# Patient Record
Sex: Male | Born: 1940
Health system: Southern US, Community
[De-identification: ages and names within clinical notes are randomized; demographics above are authoritative.]

## PROBLEM LIST (undated history)

## (undated) DIAGNOSIS — N529 Male erectile dysfunction, unspecified: Secondary | ICD-10-CM

## (undated) DIAGNOSIS — H5359 Other color vision deficiencies: Secondary | ICD-10-CM

## (undated) DIAGNOSIS — T7840XA Allergy, unspecified, initial encounter: Secondary | ICD-10-CM

## (undated) DIAGNOSIS — E119 Type 2 diabetes mellitus without complications: Secondary | ICD-10-CM

## (undated) DIAGNOSIS — N400 Enlarged prostate without lower urinary tract symptoms: Secondary | ICD-10-CM

## (undated) DIAGNOSIS — S060X1A Concussion with loss of consciousness of 30 minutes or less, initial encounter: Secondary | ICD-10-CM

## (undated) DIAGNOSIS — H9190 Unspecified hearing loss, unspecified ear: Secondary | ICD-10-CM

## (undated) DIAGNOSIS — E785 Hyperlipidemia, unspecified: Secondary | ICD-10-CM

## (undated) DIAGNOSIS — J189 Pneumonia, unspecified organism: Secondary | ICD-10-CM

## (undated) DIAGNOSIS — E1149 Type 2 diabetes mellitus with other diabetic neurological complication: Principal | ICD-10-CM

## (undated) DIAGNOSIS — Z86010 Personal history of colonic polyps: Secondary | ICD-10-CM

## (undated) DIAGNOSIS — M25649 Stiffness of unspecified hand, not elsewhere classified: Secondary | ICD-10-CM

## (undated) HISTORY — DX: Benign prostatic hyperplasia without lower urinary tract symptoms: N40.0

## (undated) HISTORY — DX: Concussion with loss of consciousness of 30 minutes or less, initial encounter: S06.0X1A

## (undated) HISTORY — DX: Type 2 diabetes mellitus with other diabetic neurological complication: E11.49

## (undated) HISTORY — PX: VASECTOMY: SHX75

## (undated) HISTORY — PX: TONSILLECTOMY: SUR1361

## (undated) HISTORY — DX: Hyperlipidemia, unspecified: E78.5

## (undated) HISTORY — DX: Personal history of colonic polyps: Z86.010

## (undated) HISTORY — DX: Unspecified hearing loss, unspecified ear: H91.90

## (undated) HISTORY — DX: Male erectile dysfunction, unspecified: N52.9

## (undated) HISTORY — DX: Stiffness of unspecified hand, not elsewhere classified: M25.649

## (undated) HISTORY — DX: Other color vision deficiencies: H53.59

## (undated) HISTORY — PX: COLONOSCOPY W/ POLYPECTOMY: SHX1380

## (undated) HISTORY — PX: INCISION / DRAINAGE HAND / FINGER: SUR695

---

## 2005-11-13 ENCOUNTER — Encounter (INDEPENDENT_AMBULATORY_CARE_PROVIDER_SITE_OTHER): Payer: Self-pay | Admitting: *Deleted

## 2005-11-13 ENCOUNTER — Ambulatory Visit (HOSPITAL_COMMUNITY): Admission: RE | Admit: 2005-11-13 | Discharge: 2005-11-13 | Payer: Self-pay | Admitting: Gastroenterology

## 2006-12-24 ENCOUNTER — Ambulatory Visit: Payer: Self-pay | Admitting: Internal Medicine

## 2007-04-22 ENCOUNTER — Ambulatory Visit: Payer: Self-pay | Admitting: Internal Medicine

## 2007-04-22 LAB — CONVERTED CEMR LAB
Cholesterol: 177 mg/dL (ref 0–200)
Direct LDL: 91.4 mg/dL
HDL: 21.1 mg/dL — ABNORMAL LOW (ref >39.0)
PSA: 0.69 ng/mL (ref 0.10–4.00)
Total CHOL/HDL Ratio: 8.4
Triglycerides: 219 mg/dL (ref 0–149)
VLDL: 44 mg/dL — ABNORMAL HIGH (ref 0–40)

## 2007-08-26 ENCOUNTER — Ambulatory Visit: Payer: Self-pay | Admitting: Internal Medicine

## 2008-03-15 ENCOUNTER — Ambulatory Visit: Payer: Self-pay | Admitting: Internal Medicine

## 2008-03-15 DIAGNOSIS — Z860100 Personal history of colon polyps, unspecified: Secondary | ICD-10-CM

## 2008-03-15 DIAGNOSIS — R109 Unspecified abdominal pain: Secondary | ICD-10-CM | POA: Insufficient documentation

## 2008-03-15 HISTORY — DX: Personal history of colonic polyps: Z86.010

## 2008-03-15 HISTORY — DX: Personal history of colon polyps, unspecified: Z86.0100

## 2008-07-28 ENCOUNTER — Telehealth: Payer: Self-pay | Admitting: Internal Medicine

## 2008-08-17 ENCOUNTER — Ambulatory Visit: Payer: Self-pay | Admitting: Internal Medicine

## 2008-10-28 ENCOUNTER — Ambulatory Visit: Payer: Self-pay | Admitting: Internal Medicine

## 2008-10-28 DIAGNOSIS — M25649 Stiffness of unspecified hand, not elsewhere classified: Secondary | ICD-10-CM

## 2008-10-28 HISTORY — DX: Stiffness of unspecified hand, not elsewhere classified: M25.649

## 2008-10-28 LAB — CONVERTED CEMR LAB
BUN: 10 mg/dL (ref 6–23)
CO2: 32 meq/L (ref 19–32)
Calcium: 9.2 mg/dL (ref 8.4–10.5)
Chloride: 108 meq/L (ref 96–112)
Creatinine, Ser: 1.1 mg/dL (ref 0.4–1.5)
GFR calc Af Amer: 85 mL/min
GFR calc non Af Amer: 71 mL/min
Glucose, Bld: 144 mg/dL — ABNORMAL HIGH (ref 70–99)
Potassium: 4.1 meq/L (ref 3.5–5.1)
Rheumatoid fact SerPl-aCnc: <20 intl units/mL — ABNORMAL LOW (ref 0.0–20.0)
Sed Rate: 6 mm/hr (ref 0–16)
Sodium: 143 meq/L (ref 135–145)

## 2009-11-01 ENCOUNTER — Ambulatory Visit: Payer: Self-pay | Admitting: Internal Medicine

## 2009-11-01 DIAGNOSIS — H5359 Other color vision deficiencies: Secondary | ICD-10-CM

## 2009-11-01 DIAGNOSIS — S060X1A Concussion with loss of consciousness of 30 minutes or less, initial encounter: Secondary | ICD-10-CM

## 2009-11-01 DIAGNOSIS — H9190 Unspecified hearing loss, unspecified ear: Secondary | ICD-10-CM

## 2009-11-01 DIAGNOSIS — H9113 Presbycusis, bilateral: Secondary | ICD-10-CM | POA: Insufficient documentation

## 2009-11-01 HISTORY — DX: Other color vision deficiencies: H53.59

## 2009-11-01 HISTORY — DX: Unspecified hearing loss, unspecified ear: H91.90

## 2009-11-01 HISTORY — DX: Concussion with loss of consciousness of 30 minutes or less, initial encounter: S06.0X1A

## 2009-11-01 LAB — CONVERTED CEMR LAB
BUN: 14 mg/dL (ref 6–23)
CO2: 30 meq/L (ref 19–32)
Calcium: 9.3 mg/dL (ref 8.4–10.5)
Chloride: 103 meq/L (ref 96–112)
Creatinine, Ser: 1 mg/dL (ref 0.4–1.5)
GFR calc non Af Amer: 78.36 mL/min (ref >60)
Glucose, Bld: 113 mg/dL — ABNORMAL HIGH (ref 70–99)
Potassium: 4.2 meq/L (ref 3.5–5.1)
Sodium: 140 meq/L (ref 135–145)

## 2009-11-03 ENCOUNTER — Ambulatory Visit: Payer: Self-pay | Admitting: Internal Medicine

## 2009-11-03 DIAGNOSIS — N410 Acute prostatitis: Secondary | ICD-10-CM

## 2009-11-03 DIAGNOSIS — N390 Urinary tract infection, site not specified: Secondary | ICD-10-CM

## 2009-11-03 LAB — CONVERTED CEMR LAB
Bilirubin Urine: NEGATIVE
Glucose, Urine, Semiquant: NEGATIVE
Ketones, urine, test strip: NEGATIVE
Nitrite: POSITIVE
Protein, U semiquant: NEGATIVE
Specific Gravity, Urine: 1.02
Urobilinogen, UA: 0.2
pH: 6

## 2009-11-06 ENCOUNTER — Telehealth: Payer: Self-pay | Admitting: Internal Medicine

## 2010-07-13 HISTORY — PX: CATARACT EXTRACTION: SUR2

## 2010-08-10 ENCOUNTER — Ambulatory Visit: Payer: Self-pay | Admitting: Internal Medicine

## 2010-10-23 ENCOUNTER — Encounter: Payer: Self-pay | Admitting: Internal Medicine

## 2010-11-23 ENCOUNTER — Other Ambulatory Visit: Payer: Self-pay | Admitting: Internal Medicine

## 2010-11-23 ENCOUNTER — Ambulatory Visit
Admission: RE | Admit: 2010-11-23 | Discharge: 2010-11-23 | Payer: Self-pay | Source: Home / Self Care | Attending: Internal Medicine | Admitting: Internal Medicine

## 2010-11-23 ENCOUNTER — Encounter: Payer: Self-pay | Admitting: Internal Medicine

## 2010-11-23 LAB — BASIC METABOLIC PANEL
BUN: 15 mg/dL (ref 6–23)
CO2: 30 mEq/L (ref 19–32)
Calcium: 9.5 mg/dL (ref 8.4–10.5)
Chloride: 101 mEq/L (ref 96–112)
Creatinine, Ser: 1.1 mg/dL (ref 0.4–1.5)
GFR: 73.05 mL/min (ref >60.00)
Glucose, Bld: 115 mg/dL — ABNORMAL HIGH (ref 70–99)
Potassium: 4.6 mEq/L (ref 3.5–5.1)
Sodium: 137 mEq/L (ref 135–145)

## 2010-11-23 LAB — LIPID PANEL
Cholesterol: 224 mg/dL — ABNORMAL HIGH (ref 0–200)
HDL: 28.6 mg/dL — ABNORMAL LOW (ref >39.00)
Total CHOL/HDL Ratio: 8
Triglycerides: 228 mg/dL — ABNORMAL HIGH (ref 0.0–149.0)
VLDL: 45.6 mg/dL — ABNORMAL HIGH (ref 0.0–40.0)

## 2010-11-23 LAB — CBC WITH DIFFERENTIAL/PLATELET
Basophils Absolute: 0 10*3/uL (ref 0.0–0.1)
Basophils Relative: 0.4 % (ref 0.0–3.0)
Eosinophils Absolute: 0 10*3/uL (ref 0.0–0.7)
Eosinophils Relative: 0.6 % (ref 0.0–5.0)
HCT: 45.8 % (ref 39.0–52.0)
Hemoglobin: 15.9 g/dL (ref 13.0–17.0)
Lymphocytes Relative: 34.8 % (ref 12.0–46.0)
Lymphs Abs: 2.2 10*3/uL (ref 0.7–4.0)
MCHC: 34.6 g/dL (ref 30.0–36.0)
MCV: 89 fl (ref 78.0–100.0)
Monocytes Absolute: 0.7 10*3/uL (ref 0.1–1.0)
Monocytes Relative: 11.1 % (ref 3.0–12.0)
Neutro Abs: 3.3 10*3/uL (ref 1.4–7.7)
Neutrophils Relative %: 53.1 % (ref 43.0–77.0)
Platelets: 182 10*3/uL (ref 150.0–400.0)
RBC: 5.15 Mil/uL (ref 4.22–5.81)
RDW: 14 % (ref 11.5–14.6)
WBC: 6.2 10*3/uL (ref 4.5–10.5)

## 2010-11-23 LAB — LDL CHOLESTEROL, DIRECT: Direct LDL: 146 mg/dL

## 2010-11-23 LAB — TSH: TSH: 1.31 u[IU]/mL (ref 0.35–5.50)

## 2010-12-12 NOTE — Assessment & Plan Note (Signed)
Summary: flu shot-lb  Nurse Visit   Vital Signs:  Patient profile:   70 year old male Temp:     98.3 degrees F oral  Vitals Entered By: Lanier Prude, CMA(AAMA) (August 10, 2010 1:39 PM)  Allergies: No Known Drug Allergies  Orders Added: 1)  Flu Vaccine 56yrs + MEDICARE PATIENTS [Q2039] 2)  Administration Flu vaccine - MCR [G0008] .lbmedflu   Flu Vaccine Consent Questions     Do you have a history of severe allergic reactions to this vaccine? no    Any prior history of allergic reactions to egg and/or gelatin? no    Do you have a sensitivity to the preservative Thimersol? no    Do you have a past history of Guillan-Barre Syndrome? no    Do you currently have an acute febrile illness? no    Have you ever had a severe reaction to latex? no    Vaccine information given and explained to patient? yes    Are you currently pregnant? no    Lot Number:[id number]   Exp Date:05/12/2011   Site Given  Left Deltoid IM Lanier Prude, [facility])  August 10, 2010 1:39 PM

## 2010-12-14 NOTE — Procedures (Signed)
Summary: Colonoscopy / [facility]  Colonoscopy / [facility]   Imported By: Lennie Odor 11/09/2010 14:35:54  _____________________________________________________________________  External Attachment:    Type:   Image     Comment:   External Document

## 2010-12-14 NOTE — Assessment & Plan Note (Signed)
Summary: CPX-LB   Vital Signs:  Patient profile:   70 year old male Height:      73 inches Weight:      247 pounds BMI:     32.71 Temp:     98.4 degrees F Pulse rate:   55 / minute BP sitting:   130 / 72  (left arm) Cuff size:   regular  Vitals Entered By: Lamar Sprinkles, CMA (November 23, 2010 10:16 AM) CC: Frank Carlson   Primary Care Provider:  Deforest Maiden  CC:  Frank Carlson.  History of Present Illness: Frank Carlson presents for an annual wellness exam. Interval hisotry unremarkable with no major illness, no injury or surgery. Feeling good  1005 independent in a ADLs. Cognitively in tact paying the bills, balancing check bood. No falls and no fall risk. No signs or symptoms of depression.   Preventive Screening-Counseling & Management  Alcohol-Tobacco     Alcohol drinks/day: 0     Smoking Status: quit > 6 months     Packs/Day: 2.0     Year Started: 03-31-1956     Year Quit: 03/31/1961     Pack years: 7  Caffeine-Diet-Exercise     Caffeine use/day: none     Diet Comments: Healthy diet - weight watchers     Does Patient Exercise: yes     Type of exercise: treadmill/weights     Exercise (avg: min/session): 60     Times/week: 3     Exercise Counseling: not indicated; exercise is adequate  Hep-HIV-STD-Contraception     Hepatitis Risk: no risk noted     HIV Risk: no risk noted     STD Risk: no risk noted     Dental Visit-last 6 months yes     TSE monthly: yes     04-01-2023 Exposure-Excessive: no  Safety-Violence-Falls     Seat Belt Use: yes     Helmet Use: yes     Firearms in the Home: no firearms in the home     Smoke Detectors: yes     Violence in the Home: no risk noted     Sexual Abuse: no     Fall Risk: none      Sexual History:  currently monogamous.        Drug Use:  never.        Blood Transfusions:  no.    Current Medications (verified): 1)  Fish Oil 1000 Mg Caps (Omega-3 Fatty Acids) .Marland Kitchen.. 1 Every Am 2)  Fibercon 625 Mg Tabs (Calcium Polycarbophil) .Marland Kitchen.. 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 married 01-Apr-2063 3 sons- 03-31-2065, 04-01-67, 03/31/2070; 5 grandchildren, 1 step-gtrandhcild. woodworker making furniture  Past Surgical History: Tonsillectomy I&D hand infection Vasectomy cataract extraction OD with IOL (Sept '11)  Social History: Smoking Status:  quit > 6 months Packs/Day:  2.0 Seat Belt Use:  yes Dental Care w/in 6 mos.:  yes Caffeine use/day:  none Hepatitis Risk:  no risk noted HIV Risk:  no risk noted STD Risk:  no risk noted Sun Exposure-Excessive:  no Fall Risk:  none  Review of Systems       The patient complains of decreased hearing.  The patient denies anorexia, fever, weight loss, weight gain, vision loss, hoarseness, chest pain, syncope, dyspnea on exertion, peripheral edema, prolonged cough, abdominal pain, severe indigestion/heartburn, incontinence, muscle weakness, difficulty walking, depression, abnormal bleeding, enlarged lymph nodes, and angioedema.         nocturia - x 1  Physical Exam  General:  Tall, WNWD white male in no distress Head:  normocephalic, atraumatic, and no abnormalities observed.   Eyes:  vision grossly intact, pupils equal, and pupils round, cannot tell he had cataract surgery. Fundiscopic exam deferred to Opthal  Ears:  hearing aids in place Nose:  no  external deformity, no external erythema, and no nasal discharge.   Mouth:  Oral mucosa and oropharynx without lesions or exudates.  Teeth in good repair. Neck:  supple, full ROM, no masses, no thyromegaly, and no carotid bruits.   Chest Wall:  No deformities, masses, tenderness or gynecomastia noted. Lungs:  Normal respiratory effort, chest expands symmetrically. Lungs are clear to auscultation, no crackles or wheezes. Heart:  Normal rate and regular rhythm. S1 and S2 normal without gallop, murmur, click, rub or other extra sounds. Abdomen:  soft, non-tender, normal bowel sounds, no masses, no guarding, no rigidity, and no hepatomegaly.   Rectal:  no external abnormalities, no hemorrhoids, normal sphincter tone, and no masses.   Genitalia:  no hydrocele, no varicocele, and no scrotal masses.   Prostate:  no gland enlargement, no nodules, no asymmetry, and no induration.   Msk:  no joint tenderness, no joint swelling, no joint warmth, no redness over joints, no joint deformities, and no joint instability.   Pulses:  2+ radial and DP pulses Extremities:  No clubbing, cyanosis, edema, or deformity noted with normal full range of motion of all joints.   Neurologic:  alert & oriented X3, cranial nerves II-XII intact, gait normal, and DTRs symmetrical and normal.   Skin:  turgor normal, color normal, no suspicious lesions, and no ulcerations.  Changes of old acne Cervical Nodes:  no anterior cervical adenopathy and no posterior cervical adenopathy.   Axillary Nodes:  no R axillary adenopathy and no L axillary adenopathy.   Inguinal Nodes:  no R inguinal adenopathy and no L inguinal adenopathy.   Psych:  Oriented X3, memory intact for recent and remote, normally interactive, and not anxious appearing.     Impression & Recommendations:  Problem # 1:  JOINT STIFFNESS, HAND (ICD-719.54) Seems stable with no complaints today of pain or limitation.  Problem # 2:  Preventive Health Care  (ICD-V70.0) Interval history is unremarkable except for cataract surgery. Physical exam is normal. Lab results are within normal limits except for low HDL cholesterol and modest elevation in LDL cholesterol at 146 with goal of 130 or less. Plan - careful diet in regard to fat; increase in aerobic exercise. Current with colorectal cancer screening. Normal prostate exam. Immunization: Tetnus Dec '10; Pneumonia vaccine Dec '10; flu Sept '11. Discussed shingles vaccine and he will check his insurance coverage. 12 Lead EKG with right bundle branch block otherwise normal.  In summary - a very nice man who appears medical stable and to be doing well. He will return in 1 year or as needed .  Complete Medication List: 1)  Fish Oil 1000 Mg Caps (  Omega-3 fatty acids) .Marland Kitchen.. 1 every am 2)  Fibercon 625 Mg Tabs (Calcium polycarbophil) .Marland Kitchen.. 1 by mouth q am  Other Orders: TLB-BMP (Basic Metabolic Panel-BMET) (80048-METABOL) TLB-Lipid Panel (80061-LIPID) TLB-TSH (Thyroid Stimulating Hormone) (84443-TSH) TLB-CBC Platelet - w/Differential (85025-CBCD) Medicare -1st Annual Wellness Visit [phone])   : Lew Dawes Note: All result statuses are Final unless otherwise noted.  Tests: (1) BMP (METABOL)   Sodium                    137 mEq/L                   135-145   Potassium                 4.6 mEq/L                   3.5-5.1   Chloride                  101 mEq/L                   96-112   Carbon Dioxide            30 mEq/L                    19-32   Glucose              [H]  115 mg/dL                   19-14   BUN                       15 mg/dL                    7-82   Creatinine                1.1 mg/dL                   9.5-6.2   Calcium                   9.5 mg/dL                   1.3-08.6   GFR                       73.05 mL/min                >60.00  Tests: (2) Lipid Panel (LIPID)   Cholesterol          [H]  224 mg/dL                   5-784     ATP III Classification            Desirable:  < 200  mg/dL                    Borderline High:  200 - 239 mg/dL               High:  > = 240 mg/dL   Triglycerides        [H]  228.0 mg/dL                 6.9-629.5     Normal:  <150 mg/dL     Borderline High:  284 - 199 mg/dL  HDL                  [L]  16.10 mg/dL                 >96.04   VLDL Cholesterol     [H]  45.6 mg/dL                  5.4-09.8  CHO/HDL Ratio:  CHD Risk                             8                    Men          Women     1/2 Average Risk     3.4          3.3     Average Risk          5.0          4.4     2X Average Risk          9.6          7.1     3X Average Risk          15.0          11.0                           Tests: (3) TSH (TSH)   FastTSH                   1.31 uIU/mL                 0.35-5.50  Tests: (4) CBC Platelet w/Diff (CBCD)   White Cell Count          6.2 K/uL                    4.5-10.5   Red Cell Count            5.15 Mil/uL                 4.22-5.81   Hemoglobin                15.9 g/dL                   11.9-14.7   Hematocrit                45.8 %                      39.0-52.0   MCV                       89.0 fl                     78.0-100.0   MCHC                      34.6 g/dL                   82.9-56.2   RDW                       14.0 %  11.5-14.6   Platelet Count            182.0 K/uL                  150.0-400.0   Neutrophil %              53.1 %                      43.0-77.0   Lymphocyte %              34.8 %                      12.0-46.0   Monocyte %                11.1 %                      3.0-12.0   Eosinophils%              0.6 %                       0.0-5.0   Basophils %               0.4 %                       0.0-3.0   Neutrophill Absolute      3.3 K/uL                    1.4-7.7   Lymphocyte Absolute       2.2 K/uL                    0.7-4.0   Monocyte Absolute         0.7 K/uL                    0.1-1.0  Eosinophils, Absolute                             0.0 K/uL                    0.0-0.7    Basophils Absolute        0.0 K/uL                    0.0-0.1  Tests: (5) Cholesterol LDL - Direct (DIRLDL)  Cholesterol LDL - Direct                             146.0 mg/dL     Optimal:  <621 mg/dL     Near or Above Optimal:  100-129 mg/dL     Borderline High:  308-657 mg/dL     High:  846-962 mg/dL     Very High:  >952 mg/dL  Orders Added: 1)  TLB-BMP (Basic Metabolic Panel-BMET) [80048-METABOL] 2)  TLB-Lipid Panel [80061-LIPID] 3)  TLB-TSH (Thyroid Stimulating Hormone) [84443-TSH] 4)  TLB-CBC Platelet - w/Differential [85025-CBCD] 5)  Medicare -1st Annual Wellness Visit [G0438]     Preventive Care Screening  Colonoscopy:    Date:  10/23/2010    Results:  Hyperplastic Polyp

## 2011-03-16 ENCOUNTER — Telehealth: Payer: Self-pay | Admitting: Internal Medicine

## 2011-03-16 NOTE — Telephone Encounter (Signed)
Pt states that his Insurance has instructed him to get Rx for Zostavax vaccine. Pt would like to have this Rx called into [facility] [address] [employer] [phone]: [phone] Pt also requesting Rx for Viagra 100mg . #30/as a 90-day supply, renewable.

## 2011-03-18 NOTE — Telephone Encounter (Signed)
Ok to order both zostavax and viagra

## 2011-03-21 MED ORDER — ZOSTER VACCINE LIVE 19400 UNT/0.65ML ~~LOC~~ SOLR: 0.6500 mL | Freq: Once | SUBCUTANEOUS | Status: AC

## 2011-03-21 MED ORDER — SILDENAFIL CITRATE 100 MG PO TABS: 100.0000 mg | ORAL_TABLET | ORAL | Status: DC | PRN

## 2011-03-21 NOTE — Telephone Encounter (Signed)
Medication and immunization sent  In for pt

## 2011-03-27 NOTE — Assessment & Plan Note (Signed)
[facility]                           PRIMARY CARE OFFICE NOTE   HALIL, RENTZ                     MRN:          [id number]  DATE:04/22/2007                            DOB:          06/23/1941    Mr. Lando was seen as a new patient December 25, 2006, please see  that complete dictation. This was reviewed with the patient with no  additions, corrections or changes.   The patient reports he is feeling well and doing well with no active  medical complaints at this time.   REVIEW OF SYSTEMS:  Negative for any constitutional, cardiovascular,  respiratory, GI, GU or musculoskeletal complaints.   PHYSICAL EXAMINATION:  VITAL SIGNS:  Temperature was 98.1, blood  pressure was 134/76, pulse 62, weight 255, height 6 foot.  GENERAL:  A well-nourished, well-developed, mildly overweight, Caucasian  male in no acute distress/  HEENT:  Normocephalic, atraumatic. EACs and TMs were unremarkable.  Oropharynx with native dentition in good repair. No buccal or palatal  lesions were noted. The posterior pharynx was clear. Conjunctiva and  sclera was clear. PERRLA. EOMI. Funduscopic exam was unremarkable.  NECK:  Supple without thyromegaly.  NODES:  No adenopathy was noted in the cervical or supraclavicular  regions.  CHEST:  With CVA tenderness.  LUNGS:  Clear to auscultation and percussion.  CARDIOVASCULAR:  2+ radial pulses, no JVD or carotid bruits. He had a  quiet precordium with a regular rate and rhythm without murmurs, rubs or  gallops.  ABDOMEN:  Soft, no guarding, no rebound. No organosplenomegaly was  noted.  GENITALIA:  Normal male, bilaterally descended testicles.  RECTAL:  Normal sphincter tone was noted. The prostate was smooth,  round, normal size and contour without abnormality.  EXTREMITIES:  Without clubbing, cyanosis, edema or deformity.  NEUROLOGIC:  Nonfocal.   LABORATORY DATA:  Cholesterol was 177, triglycerides were 219, HDL  was  21, LDL was 91.4. PSA was normal at 0.69.   ASSESSMENT/PLAN:  1. Lipids. The patient has a depressed HDL cholesterol at 21.1. He has      no significant cardiac risk factors otherwise, has no cardiac      history and is comfortable. Plan:  Would recommend low carbohydrate      diet and regular exercise. If his HDL remains low on this regimen,      at next examination we would consider him a candidate for niacin      therapy.  2. Gastrointestinal. Patient with a history of colon polyps but has      had colonoscopy which he thinks was in 2007. Followup in 2012.  3. Health maintenance. The patient is otherwise doing well. I do not      have his immunization records but he would be a candidate if not      already done for pneumonia vaccine and      Zostavax.  4. The patient is asked to return to see me on a p.r.n. basis.     Rosalyn Gess Norins, MD  Electronically Signed    MEN/MedQ  DD: 04/23/2007  DT: 04/23/2007  Job #:  [id number]   cc:   Lew Dawes

## 2011-03-30 NOTE — Assessment & Plan Note (Signed)
[facility]                           PRIMARY CARE OFFICE NOTE   Frank Carlson, Frank Carlson                     MRN:          [id number]  DATE:12/25/2006                            DOB:          08/24/1941    Frank Carlson is a 70 year old Caucasian gentleman who presents to  establish ongoing care, transferring from [facility] due to  insurance reasons.   CHIEF COMPLAINT:  1. GI.  The patient reports that he has had a rectal discharge with a      mucus in appearance with usually light but constantly moist.  This      has been present since June 2007.  He has tried multiple treatments      under the direction of his primary care physician.  Bulk laxatives      have been most helpful but he continues to have a small amount of      discharge.  The patient reports his last colonoscopy, which he      thinks was in January 2007 was unremarkable except for 1 small      polyp.  2. URI.  The patient with post nasal discharge and drainage over 1      month in duration.  He has no fevers or chills.  He has had no      sinus pressure or tenderness or discomfort.   PAST MEDICAL HISTORY:   SURGICAL:  1. Tonsillectomy in 1945.  2. Infection in the hand requiring surgical incision and drainage.  3. Vasectomy.   TRAUMA:  The patient had a brain concussion in a football accident in  1956.   MEDICAL:  1. Usual childhood diseases with chicken pox, measles and whooping      cough.  2. Colon polyps.   The patient otherwise been healthy with no major medical problems.   MEDICATIONS:  None.   HABITS:  The patient has stopped smoking in 1962.  He uses no alcohol or  other substances.   FAMILY HISTORY:  Grandfather with stomach cancer.  Father had an MI  times 3 and died early 42s.  Also heart disease in grandparents and  secondary kinship.  Diabetes in grandparents.  Mother with Alzheimer's  disease.  No history of lung cancer or prostate cancer.   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  He then worked for [employer] till 1997 and then retired  again.  He has been married for 44 years.  He has 3 sons age 80, 71 and  25.  Two in [place], one in [place]. He has 5 grandchildren and 1  step-grandchild.  The patient is a Psychiatrist and makes furniture and  generally keeps himself busy in his retirement.   REVIEW OF SYSTEMS:  The patient has had a 10 pound weight gain in the  last year.  No fevers, chills or other constitutional symptoms.  Last  eye exam was approximately 24 months ago by optometry.  No ENT,  cardiovascular, respiratory complaints.  GI as above, but no dyspepsia,  dysuria.  He does have a regular bowel habit.  MUSCULOSKELETAL:  The  patient has chronic low back trouble, which is intermittent in nature,  does not limit his activities.  Does do well with cyclobenzaprine on an  as needed basis and hydrocodone as needed.  GU is unremarkable with no  dysuria.  Nocturia x1.  The patient does use Viagra on an as needed  basis.  DERM:  The patient has multiple solar keratoses on his arms,  question whether any of these could be actinic or squamous cell.  The  patient does have a lesion on his left cheek, which has been unchanging.  No neurologic issues are raised.   VITAL SIGNS: Temperature is 98.1, blood pressure 133/73, pulse 61,  weight 258.6.  GENERAL APPEARANCE:  This is a tall 6 foot 1 inch or 6 foot 2 inches  gentleman who looks fit and in no distress.  No examination otherwise conducted.   ASSESSMENT AND PLAN:  1. Gastrointestinal.  The patient with a rectal discharge.  He does      seem to be improved on bulk laxatives.  I believe if this continues      to be a problem we would refer him to gastroenterology for quick      flexible sigmoidoscopy.  2. Rhinitis.  The patient with probable allergic  rhinitis versus      vasomotor rhinitis.  Would recommend over-the-counter non-sedating      antihistamines such as loratadine for control of symptoms.  3. Low back pain.  The patient is currently stable.  Would recommend      that he consider doing regular back exercises.  We have provided      information when he returns.  4. The patient has requested his records from [facility] and      these will be reviewed when available. Recommendations for      laboratory studies will be based on this review.  The patient is      asked to return to see me in May for his annual physical exam.     Rosalyn Gess. Norins, MD  Electronically Signed    MEN/MedQ  DD: 12/25/2006  DT: 12/25/2006  Job #: [id number]   cc:   Pryor Montes

## 2011-03-30 NOTE — Op Note (Signed)
NAME:  Frank Carlson, Frank Carlson NO.:  [id number]   MEDICAL RECORD NO.:  [id number]          PATIENT TYPE:  AMB   LOCATION:  ENDO                         FACILITY:  [facility]   PHYSICIAN:  Danise Edge, M.D.   DATE OF BIRTH:  05-Dec-1940   DATE OF PROCEDURE:  11/13/2005  DATE OF DISCHARGE:                                 OPERATIVE REPORT   PROCEDURE INDICATIONS:  Mr. Narek Kniss is a 70 year old male born [birth year]. Mr. Mcbreen is scheduled to undergo his first screening  colonoscopy with polypectomy to prevent colon cancer. He underwent a health  maintenance flexible proctosigmoidoscopy approximately 6 years ago.   ENDOSCOPIST:  Danise Edge, M.D.   PREMEDICATION:  Versed 5 mg, Demerol 70 mg.   PROCEDURE:  After obtaining informed consent, Mr. Kulish was placed in the  left lateral decubitus position. I administered intravenous Demerol and  intravenous Versed to achieve conscious sedation for the procedure. The  patient's blood pressure, oxygen saturation and cardiac rhythm were  monitored throughout the procedure and documented in the medical record.   Anal inspection was normal. Digital rectal exam reveals a nonnodular  prostate. The Olympus adjustable pediatric colonoscope was introduced into  the rectum and advanced to the cecum. A normal-appearing ileocecal valve and  appendiceal orifice were identified. Colonic preparation for the exam today  was excellent.   Rectum normal. Retroflexed view of the distal rectum normal.  Sigmoid colon and descending colon. Left colonic diverticulosis.  Splenic flexure normal.  Transverse colon. From the proximal transverse colon, a 3 mm sessile polyp  was removed with electrocautery snare.  Hepatic flexure normal.  Ascending colon normal.  Cecum and ileocecal valve normal.   ASSESSMENT:  A small polyp was removed from the proximal transverse colon; a  few small diverticula were present in the sigmoid colon.          ______________________________  Danise Edge, M.D.     MJ/MEDQ  D:  11/13/2005  T:  11/13/2005  Job:  [id number]   cc:   Melida Quitter, M.D.  Fax: [phone]

## 2011-08-16 ENCOUNTER — Ambulatory Visit: Payer: Medicare Other | Admitting: *Deleted

## 2011-11-12 ENCOUNTER — Encounter: Payer: Self-pay | Admitting: Internal Medicine

## 2011-11-26 ENCOUNTER — Ambulatory Visit (INDEPENDENT_AMBULATORY_CARE_PROVIDER_SITE_OTHER): Payer: Medicare Other | Admitting: Internal Medicine

## 2011-11-26 ENCOUNTER — Encounter: Payer: Self-pay | Admitting: Internal Medicine

## 2011-11-26 DIAGNOSIS — Z86010 Personal history of colonic polyps: Secondary | ICD-10-CM

## 2011-11-26 DIAGNOSIS — H9190 Unspecified hearing loss, unspecified ear: Secondary | ICD-10-CM

## 2011-11-26 DIAGNOSIS — Z0000 Encounter for general adult medical examination without abnormal findings: Secondary | ICD-10-CM | POA: Insufficient documentation

## 2011-11-26 NOTE — Assessment & Plan Note (Signed)
Stable with no change in bowel habit.

## 2011-11-26 NOTE — Assessment & Plan Note (Signed)
Interval history negative for any major illness, injury or surgery. He did fall from a ladder while cutting limbs - no major injury. Physical exam - normal. NO labs needed. Current with colorectal cancer screening. Normal prostate exam. Current with immunizations.  In summary - a very nice man who is medically stable. He will return in 1 year or as needed.

## 2011-11-26 NOTE — Progress Notes (Signed)
Subjective:    Patient ID: Frank Carlson, male    DOB: [birth year], 71 y.o.   MRN: [id number]  HPI The patient is here for annual Medicare wellness examination and management of other chronic and acute problems. He reports that he is feeling fine.    The risk factors are reflected in the social history.  The roster of all physicians providing medical care to patient - is listed in the Snapshot section of the chart.  Activities of daily living:  The patient is 100% inedpendent in all ADLs: dressing, toileting, feeding as well as independent mobility  Home safety : The patient has smoke detectors in the home. Fall safety - has grab bars in the shower, household is cleared.  They wear seatbelts. No firearms at home. There is no violence in the home.   There is no risks for hepatitis, STDs or HIV. There is no   history of blood transfusion. They have no travel history to infectious disease endemic areas of the world.  The patient has seen their dentist in the last six month. They have seen their eye doctor in the last year. They admit to any hearing difficulty and have not had audiologic testing in the last year.  Routine follow-up for maintaining hearing aids. They do not  have excessive sun exposure. Discussed the need for sun protection: hats, long sleeves and use of sunscreen if there is significant sun exposure.   Diet: the importance of a healthy diet is discussed. They do have a healthy diet.  The patient has a regular exercise program: aerobic machines and weight machines , 45 min duration, 5 per week.  The benefits of regular aerobic exercise were discussed.  Depression screen: there are no signs or vegative symptoms of depression- irritability, change in appetite, anhedonia, sadness/tearfullness.  Cognitive assessment: the patient manages all their financial and personal affairs and is actively engaged.   The following portions of the patient's history were reviewed and updated as  appropriate: allergies, current medications, past family history, past medical history,  past surgical history, past social history  and problem list.  Vision, hearing, body mass index were assessed and reviewed.   During the course of the visit the patient was educated and counseled about appropriate screening and preventive services including : fall prevention , diabetes screening, nutrition counseling, colorectal cancer screening, and recommended immunizations.  Past Medical History  Diagnosis Date  . Abdominal pain, unspecified site 03/15/2008  . Acute prostatitis 11/03/2009  . COLONIC POLYPS, HX OF 03/15/2008  . 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 CONCUSSION WITH LOC OF 30 MINUTES OR LESS 11/01/2009  . HEARING LOSS, BILATERAL 11/01/2009  . JOINT STIFFNESS, HAND 10/28/2008   Past Surgical History  Procedure Date  . Tonsillectomy   . Incision / drainage hand / finger   . Vasectomy   . Cataract extraction 07/2010    OD with IOL   Family History  Problem Relation Age of Onset  . Alzheimer's disease Mother   . Coronary artery disease Father   . Coronary artery disease Other   . Diabetes Other    History   Social History  . Marital Status: Married    Spouse Name: Andrey Campanile    Number of Children: 3  . Years of Education: 16   Occupational History  . Acupuncturist    Social History Main Topics  . Smoking status: Former Smoker -- 2.0 packs/day for 5 years    Types: Cigarettes    Quit date:  11/25/1958  . Smokeless tobacco: Never Used  . Alcohol Use: No  . Drug Use: No  . Sexually Active: Yes -- Male partner(s)   Other Topics Concern  . Not on file   Social History Narrative   [employer]- [employer]. 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 Married '64. 3 sons- '66, '68, '71; 5 grandchildren, 1 step g-dtr. 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 ACP - established HCPOA; no prolonged heroic measures if not viable. Yes- CPR, yes- short term mechanical  ventilation.        Review of Systems Constitutional:  Negative for fever, chills, activity change and unexpected weight change.  HEENT:  Negative for hearing loss, ear pain, congestion, neck stiffness and postnasal drip. Negative for sore throat or swallowing problems. Negative for dental complaints.   Eyes: Negative for vision loss or change in visual acuity.  Respiratory: Negative for chest tightness and wheezing. Negative for DOE.   Cardiovascular: Negative for chest pain or palpitations. No decreased exercise tolerance Gastrointestinal: No change in bowel habit. No bloating or gas. No reflux or indigestion Genitourinary: Negative for urgency, frequency, flank pain and difficulty urinating.  Musculoskeletal: Negative for myalgias, back pain, arthralgias and gait problem.  Neurological: Negative for dizziness, tremors, weakness and headaches.  Hematological: Negative for adenopathy.  Psychiatric/Behavioral: Negative for behavioral problems and dysphoric mood.       Objective:   Physical Exam Filed Vitals:   11/26/11 0903  BP: 116/78  Pulse: 53  Temp: 97.9 F (36.6 C)  Vital signs reviewed Gen'l: Well nourished well developed     male in no acute distress  HEENT: Head: Normocephalic and atraumatic. Right Ear: External ear normal. EAC/TM nl. Left Ear: External ear normal. Hearing aides both ears. EAC/TM nl. Nose: Nose normal. Mouth/Throat: Oropharynx is clear and moist. Dentition - native, in good repair. No buccal or palatal lesions. Posterior pharynx clear. Eyes: Conjunctivae and sclera clear. EOM intact. Pupils are equal, round, and reactive to light. Right eye exhibits no discharge. Left eye exhibits no discharge. Neck: Normal range of motion. Neck supple. No JVD present. No tracheal deviation present. No thyromegaly present.  Cardiovascular: Normal rate, regular rhythm, no gallop, no friction rub, no murmur heard.      Quiet precordium. 2+ radial and DP pulses . No carotid  bruits Pulmonary/Chest: Effort normal. No respiratory distress or increased WOB, no wheezes, no rales. No chest wall deformity or CVAT. Abdominal: Soft. Bowel sounds are normal in all quadrants. He exhibits no distension, no tenderness, no rebound or guarding, No heptosplenomegaly  Genitourinary:  NST, normal prostate Musculoskeletal: Normal range of motion. He exhibits no edema and no tenderness.       Small and large joints without redness, synovial thickening or deformity. Full range of motion preserved about all small, median and large joints.  Lymphadenopathy:    He has no cervical or supraclavicular adenopathy.  Neurological: He is alert and oriented to person, place, and time. CN II-XII intact. DTRs 2+ and symmetrical biceps, radial and patellar tendons. Cerebellar function normal with no tremor, rigidity, normal gait and station.  Skin: Skin is warm and dry. No rash noted. No erythema.  Psychiatric: He has a normal mood and affect. His behavior is normal. Thought content normal.   No chronic illness, no use of chronic medications - no labs ordered.       Assessment & Plan:

## 2011-11-26 NOTE — Assessment & Plan Note (Signed)
Doing well with bilateral hearing aids.  

## 2012-08-19 ENCOUNTER — Ambulatory Visit (INDEPENDENT_AMBULATORY_CARE_PROVIDER_SITE_OTHER): Payer: Medicare Other | Admitting: *Deleted

## 2012-08-19 DIAGNOSIS — Z23 Encounter for immunization: Secondary | ICD-10-CM

## 2012-11-26 ENCOUNTER — Encounter: Payer: Self-pay | Admitting: Internal Medicine

## 2012-11-26 ENCOUNTER — Ambulatory Visit (INDEPENDENT_AMBULATORY_CARE_PROVIDER_SITE_OTHER): Payer: Medicare Other | Admitting: Internal Medicine

## 2012-11-26 VITALS — BP 118/72 | HR 63 | Temp 98.0°F | Resp 10 | Ht 73.5 in | Wt 248.0 lb

## 2012-11-26 DIAGNOSIS — Z0000 Encounter for general adult medical examination without abnormal findings: Secondary | ICD-10-CM

## 2012-11-26 NOTE — Assessment & Plan Note (Signed)
Interval medical history is negative for any illness, injury or surgery. Physical exam is normal. Reviewed labs from '12 - normal values except for mild elevation in LDL at 146. Discussed NCEP ATP III recommendation and the threshold for treatment being LDL = 160. At this time it is safe to defer labs. He is current with colorectal cancer screening and he has aged out of prostate cancer screening (ACU 4/13 rec). Immunizations are current and up to date.  In summary - a very nice man who is medically stable. He is to continue his exercise program. He is encouraged to check out [email or web address] for brain games.  He will return in 1 year, sooner as needed.

## 2012-11-26 NOTE — Progress Notes (Signed)
Subjective:    Patient ID: Frank Carlson, male    DOB: Aug 21, 1941, 72 y.o.   MRN: [id number]  HPI The patient is here for annual Medicare wellness examination and management of other chronic and acute problems.   Interval history is unremarkable except for three episodes of transient paresthesias of the left foot, almost certainly positional.  The risk factors are reflected in the social history.  The roster of all physicians providing medical care to patient - is listed in the Snapshot section of the chart.  Activities of daily living:  The patient is 100% inedpendent in all ADLs: dressing, toileting, feeding as well as independent mobility  Home safety : The patient has smoke detectors in the home. Falls - no falls in the past year. Home is fall safe.  They wear seatbelts. No firearms at home There is no risks for hepatitis, STDs or HIV. There is no   history of blood transfusion. They have no travel history to infectious disease endemic areas of the world.  The patient has seen their dentist in the last six month. They have seen their eye doctor in the last year had cataract extraction Dec '13. . They admit to hearing difficulty but no change and he continues to use earing aids. He has not had audiologic testing in the last year.    They do not  have excessive sun exposure. Discussed the need for sun protection: hats, long sleeves and use of sunscreen if there is significant sun exposure.   Diet: the importance of a healthy diet is discussed. They do have a healthy diet.  The patient has a regular exercise program: at the gym , 40 min duration, 5 per week.  The benefits of regular aerobic exercise were discussed  Depression screen: there are no signs or vegative symptoms of depression- irritability, change in appetite, anhedonia, sadness/tearfullness.  Cognitive assessment: the patient manages all their financial and personal affairs and is actively engaged.   The following  portions of the patient's history were reviewed and updated as appropriate: allergies, current medications, past family history, past medical history,  past surgical history, past social history  and problem list.  Past Medical History  Diagnosis Date  . Abdominal pain, unspecified site 03/15/2008  . Acute prostatitis 11/03/2009  . COLONIC POLYPS, HX OF 03/15/2008  . 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 CONCUSSION WITH LOC OF 30 MINUTES OR LESS 11/01/2009  . HEARING LOSS, BILATERAL 11/01/2009  . JOINT STIFFNESS, HAND 10/28/2008   Past Surgical History  Procedure Date  . Tonsillectomy   . Incision / drainage hand / finger   . Vasectomy   . Cataract extraction 07/2010    OD with IOL   Family History  Problem Relation Age of Onset  . Alzheimer's disease Mother   . Coronary artery disease Father   . Coronary artery disease Other   . Diabetes Other    History   Social History  . Marital Status: Married    Spouse Name: Andrey Campanile    Number of Children: 3  . Years of Education: 16   Occupational History  . Acupuncturist    Social History Main Topics  . Smoking status: Former Smoker -- 2.0 packs/day for 5 years    Types: Cigarettes    Quit date: 11/25/1958  . Smokeless tobacco: Never Used  . Alcohol Use: No  . Drug Use: No  . Sexually Active: Yes -- Male partner(s)   Other Topics Concern  . Not on  file   Social History Narrative   [employer]- [employer]. 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 Married '64. 3 sons- '66, '68, '71; 5 grandchildren, 1 step g-dtr. 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 ACP - established HCPOA; no prolonged heroic measures if not viable. Yes- CPR, yes- short term mechanical ventilation.    Current Outpatient Prescriptions on File Prior to Visit  Medication Sig Dispense Refill  . sildenafil (VIAGRA) 100 MG tablet Take 100 mg by mouth as needed.         Vision, hearing, body mass index were assessed and reviewed.   During  the course of the visit the patient was educated and counseled about appropriate screening and preventive services including : fall prevention , diabetes screening, nutrition counseling, colorectal cancer screening, and recommended immunizations.    Review of Systems Constitutional:  Negative for fever, chills, activity change and unexpected weight change.  HEENT:  Negative for hearing loss, ear pain, congestion, neck stiffness and postnasal drip. Negative for sore throat or swallowing problems. Negative for dental complaints.   Eyes: Negative for vision loss or change in visual acuity.  Respiratory: Negative for chest tightness and wheezing. Negative for DOE.   Cardiovascular: Negative for chest pain or palpitations. No decreased exercise tolerance Gastrointestinal: No change in bowel habit. No bloating or gas. No reflux or indigestion Genitourinary: Negative for urgency, frequency, flank pain and difficulty urinating.  Musculoskeletal: Negative for myalgias, back pain, arthralgias and gait problem.  Neurological: Negative for dizziness, tremors, weakness and headaches.  Hematological: Negative for adenopathy.  Psychiatric/Behavioral: Negative for behavioral problems and dysphoric mood.       Objective:   Physical Exam Filed Vitals:   11/26/12 0856  BP: 118/72  Pulse: 63  Temp: 98 F (36.7 C)  Resp: 10   Wt Readings from Last 3 Encounters:  11/26/12 248 lb (112.492 kg)  11/26/11 241 lb (109.317 kg)  11/23/10 247 lb (112.038 kg)   Gen'l: Well nourished well developed white male in no acute distress  HEENT: Head: Normocephalic and atraumatic. Right Ear: External ear normal. EAC/TM nl. Left Ear: External ear normal.  EAC/TM nl. Nose: Nose normal. Mouth/Throat: Oropharynx is clear and moist. Dentition - native, in good repair. No buccal or palatal lesions. Posterior pharynx clear. Eyes: Conjunctivae and sclera clear. EOM intact. Pupils are equal, round, and reactive to light. Right  eye exhibits no discharge. Left eye exhibits no discharge. Neck: Normal range of motion. Neck supple. No JVD present. No tracheal deviation present. No thyromegaly present.  Cardiovascular: Normal rate, regular rhythm, no gallop, no friction rub, no murmur heard.      Quiet precordium. 2+ radial and DP pulses . No carotid bruits Pulmonary/Chest: Effort normal. No respiratory distress or increased WOB, no wheezes, no rales. No chest wall deformity or CVAT. Abdomen: Soft. Bowel sounds are normal in all quadrants. He exhibits no distension, no tenderness, no rebound or guarding, No heptosplenomegaly  Genitourinary:  deferred Musculoskeletal: Normal range of motion. He exhibits no edema and no tenderness.       Small and large joints without redness, synovial thickening or deformity. Full range of motion preserved about all small, median and large joints.  Lymphadenopathy:    He has no cervical or supraclavicular adenopathy.  Neurological: He is alert and oriented to person, place, and time. CN II-XII intact. DTRs 2+ and symmetrical biceps, radial and patellar tendons. Cerebellar function normal with no tremor, rigidity, normal gait and station.  Skin: Skin is warm and dry.  No rash noted. No erythema.  Psychiatric: He has a normal mood and affect. His behavior is normal. Thought content normal.   Lab Results  Component Value Date   WBC 6.2 11/23/2010   HGB 15.9 11/23/2010   HCT 45.8 11/23/2010   PLT 182.0 11/23/2010   GLUCOSE 115* 11/23/2010   CHOL 224* 11/23/2010   TRIG 228.0* 11/23/2010   HDL 28.60* 11/23/2010   LDLDIRECT 146.0 11/23/2010   NA 137 11/23/2010   K 4.6 11/23/2010   CL 101 11/23/2010   CREATININE 1.1 11/23/2010   BUN 15 11/23/2010   CO2 30 11/23/2010   TSH 1.31 11/23/2010   PSA 0.69 04/22/2007           Assessment & Plan:

## 2013-07-14 ENCOUNTER — Ambulatory Visit (INDEPENDENT_AMBULATORY_CARE_PROVIDER_SITE_OTHER): Payer: Medicare Other

## 2013-07-14 DIAGNOSIS — Z23 Encounter for immunization: Secondary | ICD-10-CM

## 2013-11-12 DIAGNOSIS — E1149 Type 2 diabetes mellitus with other diabetic neurological complication: Secondary | ICD-10-CM

## 2013-11-12 HISTORY — DX: Type 2 diabetes mellitus with other diabetic neurological complication: E11.49

## 2013-11-30 ENCOUNTER — Encounter: Payer: Self-pay | Admitting: Internal Medicine

## 2013-11-30 ENCOUNTER — Other Ambulatory Visit (INDEPENDENT_AMBULATORY_CARE_PROVIDER_SITE_OTHER): Payer: Medicare HMO

## 2013-11-30 ENCOUNTER — Ambulatory Visit (INDEPENDENT_AMBULATORY_CARE_PROVIDER_SITE_OTHER): Payer: Medicare HMO | Admitting: Internal Medicine

## 2013-11-30 VITALS — BP 136/90 | HR 59 | Temp 97.2°F | Ht 73.0 in | Wt 243.8 lb

## 2013-11-30 DIAGNOSIS — Z0000 Encounter for general adult medical examination without abnormal findings: Secondary | ICD-10-CM

## 2013-11-30 DIAGNOSIS — Z23 Encounter for immunization: Secondary | ICD-10-CM

## 2013-11-30 DIAGNOSIS — E7800 Pure hypercholesterolemia, unspecified: Secondary | ICD-10-CM

## 2013-11-30 LAB — LDL CHOLESTEROL, DIRECT: Direct LDL: 108.9 mg/dL

## 2013-11-30 LAB — COMPREHENSIVE METABOLIC PANEL
ALT: 22 U/L (ref 0–53)
AST: 22 U/L (ref 0–37)
Albumin: 4.1 g/dL (ref 3.5–5.2)
Alkaline Phosphatase: 51 U/L (ref 39–117)
BUN: 15 mg/dL (ref 6–23)
CALCIUM: 9.3 mg/dL (ref 8.4–10.5)
CHLORIDE: 104 meq/L (ref 96–112)
CO2: 30 mEq/L (ref 19–32)
Creatinine, Ser: 1.1 mg/dL (ref 0.4–1.5)
GFR: 67.28 mL/min (ref >60.00)
Glucose, Bld: 172 mg/dL — ABNORMAL HIGH (ref 70–99)
POTASSIUM: 4.5 meq/L (ref 3.5–5.1)
SODIUM: 140 meq/L (ref 135–145)
TOTAL PROTEIN: 8 g/dL (ref 6.0–8.3)
Total Bilirubin: 0.7 mg/dL (ref 0.3–1.2)

## 2013-11-30 LAB — LIPID PANEL
Cholesterol: 203 mg/dL — ABNORMAL HIGH (ref 0–200)
HDL: 27.3 mg/dL — AB (ref >39.00)
Total CHOL/HDL Ratio: 7
Triglycerides: 346 mg/dL — ABNORMAL HIGH (ref 0.0–149.0)
VLDL: 69.2 mg/dL — ABNORMAL HIGH (ref 0.0–40.0)

## 2013-11-30 NOTE — Assessment & Plan Note (Addendum)
Interval history benign. Physical exam is normal. Labs ordered. Health maintenance - current with colorectal cancer screening. Routine labs ordered- in normal range including LDL of 108 but a low HDL of 29.   In summary - normal exam and healthy person. Will return in one year.

## 2013-11-30 NOTE — Progress Notes (Signed)
Subjective:    Patient ID: Frank Carlson, male    DOB: Feb 25, 1941, 73 y.o.   MRN: [id number]  HPI The patient is here for annual Medicare wellness examination and management of other chronic and acute problems.   Interval history negative for major medical illess, surgery or injury.  The risk factors are reflected in the social history.  The roster of all physicians providing medical care to patient - is listed in the Snapshot section of the chart.  Activities of daily living:  The patient is 100% inedpendent in all ADLs: dressing, toileting, feeding as well as independent mobility  Home safety : The patient has smoke detectors in the home. Falls - none in the last year. Home is fall safe.  They wear seatbelts. No firearms at home. There is no violence in the home.   There is no risks for hepatitis, STDs or HIV. There is no history of blood transfusion. They have no travel history to infectious disease endemic areas of the world.  The patient has seen their dentist in the last six month. They have seen their eye doctor in the last year. They deny any hearing difficulty and have not had audiologic testing in the last year.    They do not  have excessive sun exposure. Discussed the need for sun protection: hats, long sleeves and use of sunscreen if there is significant sun exposure.   Diet: the importance of a healthy diet is discussed. They do have a healthy diet.  The patient has a regular exercise program: gym-cardio, weight machines , 60 min duration, 5 per week.  The benefits of regular aerobic exercise were discussed.  Depression screen: there are no signs or vegative symptoms of depression- irritability, change in appetite, anhedonia, sadness/tearfullness.  Cognitive assessment: the patient manages all their financial and personal affairs and is actively engaged.   The following portions of the patient's history were reviewed and updated as appropriate: allergies, current  medications, past family history, past medical history,  past surgical history, past social history  and problem list.  Vision, hearing, body mass index were assessed and reviewed.   During the course of the visit the patient was educated and counseled about appropriate screening and preventive services including : fall prevention , diabetes screening, nutrition counseling, colorectal cancer screening, and recommended immunizations.  Past Medical History  Diagnosis Date  . Abdominal pain, unspecified site 03/15/2008  . Acute prostatitis 11/03/2009  . COLONIC POLYPS, HX OF 03/15/2008  . 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 CONCUSSION WITH LOC OF 30 MINUTES OR LESS 11/01/2009  . HEARING LOSS, BILATERAL 11/01/2009  . JOINT STIFFNESS, HAND 10/28/2008   Past Surgical History  Procedure Laterality Date  . Tonsillectomy    . Incision / drainage hand / finger    . Vasectomy    . Cataract extraction  07/2010    OD with IOL   Family History  Problem Relation Age of Onset  . Alzheimer's disease Mother   . Coronary artery disease Father   . Coronary artery disease Other   . Diabetes Other    History   Social History  . Marital Status: Married    Spouse Name: Lovey Newcomer    Number of Children: 3  . Years of Education: 16   Occupational History  . Art gallery manager    Social History Main Topics  . Smoking status: Former Smoker -- 2.00 packs/day for 5 years    Types: Cigarettes    Quit date: 11/25/1958  .  Smokeless tobacco: Never Used  . Alcohol Use: No  . Drug Use: No  . Sexual Activity: Yes    Partners: Female   Other Topics Concern  . Not on file   Social History Narrative   [employer]- Estate manager/land agent. 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 Married '64. 3 sons- '66, '68, '71; 5 grandchildren, 1 step g-dtr. 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 ACP - established HCPOA; no prolonged heroic measures if not viable. Yes- CPR, yes- short term mechanical ventilation.                 Current Outpatient Prescriptions on File Prior to Visit  Medication Sig Dispense Refill  . sildenafil (VIAGRA) 100 MG tablet Take 100 mg by mouth as needed.       No current facility-administered medications on file prior to visit.   Current Outpatient Prescriptions on File Prior to Visit  Medication Sig Dispense Refill  . sildenafil (VIAGRA) 100 MG tablet Take 100 mg by mouth as needed.       No current facility-administered medications on file prior to visit.   \   Review of Systems Constitutional:  Negative for fever, chills, activity change and unexpected weight change.  HEENT:  Negative for hearing loss, ear pain, congestion, neck stiffness and postnasal drip. Negative for sore throat or swallowing problems. Negative for dental complaints.   Eyes: Negative for vision loss or change in visual acuity.  Respiratory: Negative for chest tightness and wheezing. Negative for DOE.   Cardiovascular: Negative for chest pain or palpitations. No decreased exercise tolerance Gastrointestinal: No change in bowel habit. No bloating or gas. No reflux or indigestion Genitourinary: Negative for urgency, frequency, flank pain and difficulty urinating.  Musculoskeletal: Negative for myalgias, back pain, arthralgias and gait problem.  Neurological: Negative for dizziness, tremors, weakness and headaches.  Hematological: Negative for adenopathy.  Psychiatric/Behavioral: Negative for behavioral problems and dysphoric mood.       Objective:   Physical Exam Filed Vitals:   11/30/13 0855  BP: 136/90  Pulse: 59  Temp: 97.2 F (36.2 C)   Wt Readings from Last 3 Encounters:  11/30/13 243 lb 12.8 oz (110.587 kg)  11/26/12 248 lb (112.492 kg)  11/26/11 241 lb (109.317 kg)   BP Readings from Last 3 Encounters:  11/30/13 136/90  11/26/12 118/72  11/26/11 116/78    Gen'l: Well nourished well developed male in no acute distress  HEENT: Head: Normocephalic and atraumatic. Right Ear:  hearing aid in place. Left Ear: hearing aid in place. Nose: Nose normal. Mouth/Throat: Oropharynx is clear and moist. Dentition - native, in good repair. No buccal or palatal lesions. Posterior pharynx clear. Eyes: Conjunctivae and sclera clear. EOM intact. Pupils are equal, round, and reactive to light. Right eye exhibits no discharge. Left eye exhibits no discharge. Neck: Normal range of motion. Neck supple. No JVD present. No tracheal deviation present. No thyromegaly present.  Cardiovascular: Normal rate, regular rhythm, no gallop, no friction rub, no murmur heard.      Quiet precordium. 2+ radial and DP pulses . No carotid bruits Pulmonary/Chest: Effort normal. No respiratory distress or increased WOB, no wheezes, no rales. No chest wall deformity or CVAT. Abdomen: Soft. Bowel sounds are normal in all quadrants. He exhibits no distension, no tenderness, no rebound or guarding, No heptosplenomegaly  Genitourinary:  deferred - aged out of screening Musculoskeletal: Normal range of motion. He exhibits no edema and no tenderness.       Small and large joints  without redness, synovial thickening or deformity. Full range of motion preserved about all small, median and large joints.  Lymphadenopathy:    He has no cervical or supraclavicular adenopathy.  Neurological: He is alert and oriented to person, place, and time. CN II-XII intact. DTRs 2+ and symmetrical biceps, radial and patellar tendons. Cerebellar function normal with no tremor, rigidity, normal gait and station.  Skin: Skin is warm and dry. No rash noted. No erythema.  Psychiatric: He has a normal mood and affect. His behavior is normal. Thought content normal.   Recent Results (from the past 2160 hour(s))  LIPID PANEL     Status: Abnormal   Collection Time    11/30/13  9:55 AM      Result Value Range   Cholesterol 203 (*) 0 - 200 mg/dL   Comment: ATP III Classification       Desirable:  < 200 mg/dL               Borderline High:  200 -  239 mg/dL          High:  > = 240 mg/dL   Triglycerides 346.0 (*) 0.0 - 149.0 mg/dL   Comment: Normal:  <150 mg/dLBorderline High:  150 - 199 mg/dL   HDL 27.30 (*) >39.00 mg/dL   VLDL 69.2 (*) 0.0 - 40.0 mg/dL   Total CHOL/HDL Ratio 7     Comment:                Men          Women1/2 Average Risk     3.4          3.3Average Risk          5.0          4.42X Average Risk          9.6          7.13X Average Risk          15.0          11.0                      COMPREHENSIVE METABOLIC PANEL     Status: Abnormal   Collection Time    11/30/13  9:55 AM      Result Value Range   Sodium 140  135 - 145 mEq/L   Potassium 4.5  3.5 - 5.1 mEq/L   Chloride 104  96 - 112 mEq/L   CO2 30  19 - 32 mEq/L   Glucose, Bld 172 (*) 70 - 99 mg/dL   BUN 15  6 - 23 mg/dL   Creatinine, Ser 1.1  0.4 - 1.5 mg/dL   Total Bilirubin 0.7  0.3 - 1.2 mg/dL   Alkaline Phosphatase 51  39 - 117 U/L   AST 22  0 - 37 U/L   ALT 22  0 - 53 U/L   Total Protein 8.0  6.0 - 8.3 g/dL   Albumin 4.1  3.5 - 5.2 g/dL   Calcium 9.3  8.4 - 10.5 mg/dL   GFR 67.28  >60.00 mL/min  LDL CHOLESTEROL, DIRECT     Status: None   Collection Time    11/30/13  9:55 AM      Result Value Range   Direct LDL 108.9     Comment: Optimal:  <100 mg/dLNear or Above Optimal:  100-129 mg/dLBorderline High:  130-159 mg/dLHigh:  160-189 mg/dLVery High:  >190 mg/dL  Assessment & Plan:

## 2013-11-30 NOTE — Patient Instructions (Signed)
Good to see you.  Your history and exam are normal.   Routine labs ordered for today. Results will be posted to MyChart or mailed  Health maintenance- current. Will give Prevnar pneumonia vaccine today - once a done.  Next year you will be seeing a new provider as I move into retirement.

## 2013-11-30 NOTE — Progress Notes (Signed)
Pre visit review using our clinic review tool, if applicable. No additional management support is needed unless otherwise documented below in the visit note. 

## 2013-12-01 ENCOUNTER — Telehealth: Payer: Self-pay

## 2013-12-01 NOTE — Telephone Encounter (Signed)
Add on lab sheet has been faxed

## 2013-12-01 NOTE — Telephone Encounter (Signed)
I called patient to let him know he needs to come back to lab to have A1c drawn. He expressed understanding and states he will come some time tomorrow

## 2013-12-01 NOTE — Telephone Encounter (Signed)
Message copied by Shelly Coss on Tue Dec 01, 2013  8:09 AM ------      Message from: Adella Hare E      Created: Mon Nov 30, 2013 10:23 PM       Please send lab an add on order for an A1C on blood in the lab. If they cannot - will need patient to return. EPIC order placed.            Thanks ------

## 2013-12-02 ENCOUNTER — Ambulatory Visit: Payer: Commercial Managed Care - HMO

## 2013-12-02 DIAGNOSIS — R739 Hyperglycemia, unspecified: Secondary | ICD-10-CM

## 2013-12-02 LAB — HEMOGLOBIN A1C: Hgb A1c MFr Bld: 8.3 % — ABNORMAL HIGH (ref 4.6–6.5)

## 2013-12-08 ENCOUNTER — Encounter: Payer: Self-pay | Admitting: Internal Medicine

## 2013-12-08 ENCOUNTER — Ambulatory Visit (INDEPENDENT_AMBULATORY_CARE_PROVIDER_SITE_OTHER): Payer: Medicare HMO | Admitting: Internal Medicine

## 2013-12-08 VITALS — BP 140/78 | HR 62 | Temp 97.7°F | Wt 242.4 lb

## 2013-12-08 DIAGNOSIS — IMO0001 Reserved for inherently not codable concepts without codable children: Secondary | ICD-10-CM

## 2013-12-08 DIAGNOSIS — E7800 Pure hypercholesterolemia, unspecified: Secondary | ICD-10-CM

## 2013-12-08 DIAGNOSIS — E1149 Type 2 diabetes mellitus with other diabetic neurological complication: Secondary | ICD-10-CM | POA: Insufficient documentation

## 2013-12-08 DIAGNOSIS — E1165 Type 2 diabetes mellitus with hyperglycemia: Principal | ICD-10-CM

## 2013-12-08 MED ORDER — SIMVASTATIN 20 MG PO TABS: 20.0000 mg | ORAL_TABLET | Freq: Every day | ORAL | Status: DC

## 2013-12-08 NOTE — Assessment & Plan Note (Signed)
Discussed mechanism of disease and the implications. He is already exercising and is very enthusiastic about life-style management  Plan Life-style - no sugar diet, low carbs and continued exercise  Metformin 500 mg once a day for 1 week and the twice a day.

## 2013-12-08 NOTE — Progress Notes (Signed)
Pre visit review using our clinic review tool, if applicable. No additional management support is needed unless otherwise documented below in the visit note. 

## 2013-12-08 NOTE — Progress Notes (Signed)
   Subjective:    Patient ID: Frank Carlson, male    DOB: 19-Nov-1940, 73 y.o.   MRN: [id number]  HPI Mr. Tejada presents to review labs: he has an A1c 8.3%, an LDL of 146. He has a strong family history of diabetes and attendant complications  PMH, FamHx and SocHx reviewed for any changes and relevance.  Current Outpatient Prescriptions on File Prior to Visit  Medication Sig Dispense Refill  . sildenafil (VIAGRA) 100 MG tablet Take 100 mg by mouth as needed.       No current facility-administered medications on file prior to visit.       Review of Systems System review is negative for any constitutional, cardiac, pulmonary, GI or neuro symptoms or complaints other than as described in the HPI.     Objective:   Physical Exam Filed Vitals:   12/08/13 0803  BP: 140/78  Pulse: 62  Temp: 97.7 F (36.5 C)   Gen'l - tall, lanky man in no distress Cor - 2+ radial pulse neuor - A&O x 3       Assessment & Plan:  (greater than 50% of 45 mg visit spent on education and counseling)

## 2013-12-08 NOTE — Patient Instructions (Signed)
1. Diabetes  Discussed mechanism of disease and the implications. He is already exercising and is very enthusiastic about life-style management  Plan Life-style - no sugar diet, low carbs and continued exercise  Metformin 500 mg once a day for 1 week and the twice a day.  2. Newly diagnosed diabetic with an LDL of 146 with a goal of 100 or less.  Plan Simvastatin 20 mg  F/u lab in 3-4 weeks

## 2013-12-08 NOTE — Assessment & Plan Note (Signed)
Newly diagnosed diabetic with an LDL of 146 with a goal of 100 or less.  Plan Simvastatin 20 mg  F/u lab in 3-4 weeks

## 2013-12-11 ENCOUNTER — Telehealth: Payer: Self-pay

## 2013-12-11 NOTE — Telephone Encounter (Signed)
Relevant patient education assigned to patient using Emmi. ° °

## 2013-12-21 ENCOUNTER — Encounter: Payer: Self-pay | Admitting: Internal Medicine

## 2013-12-21 MED ORDER — METFORMIN HCL 500 MG PO TABS: 500.0000 mg | ORAL_TABLET | Freq: Two times a day (BID) | ORAL | Status: DC

## 2013-12-21 MED ORDER — METFORMIN HCL 500 MG PO TABS: ORAL_TABLET | ORAL | Status: DC

## 2013-12-21 NOTE — Telephone Encounter (Signed)
Metformin 500 mg once a day for first week then twice a day.

## 2014-01-04 ENCOUNTER — Encounter: Payer: Self-pay | Admitting: Internal Medicine

## 2014-01-04 ENCOUNTER — Other Ambulatory Visit: Payer: Self-pay | Admitting: Internal Medicine

## 2014-03-02 ENCOUNTER — Other Ambulatory Visit: Payer: Self-pay

## 2014-03-02 DIAGNOSIS — E7800 Pure hypercholesterolemia, unspecified: Secondary | ICD-10-CM

## 2014-03-02 MED ORDER — SIMVASTATIN 20 MG PO TABS: 20.0000 mg | ORAL_TABLET | Freq: Every day | ORAL | Status: DC

## 2014-03-04 ENCOUNTER — Other Ambulatory Visit: Payer: Self-pay | Admitting: Internal Medicine

## 2014-03-04 ENCOUNTER — Other Ambulatory Visit (INDEPENDENT_AMBULATORY_CARE_PROVIDER_SITE_OTHER): Payer: Commercial Managed Care - HMO

## 2014-03-04 ENCOUNTER — Ambulatory Visit (INDEPENDENT_AMBULATORY_CARE_PROVIDER_SITE_OTHER): Payer: Commercial Managed Care - HMO | Admitting: Internal Medicine

## 2014-03-04 ENCOUNTER — Encounter: Payer: Self-pay | Admitting: Internal Medicine

## 2014-03-04 VITALS — BP 124/72 | HR 59 | Temp 98.2°F | Wt 213.8 lb

## 2014-03-04 DIAGNOSIS — E7800 Pure hypercholesterolemia, unspecified: Secondary | ICD-10-CM

## 2014-03-04 DIAGNOSIS — IMO0001 Reserved for inherently not codable concepts without codable children: Secondary | ICD-10-CM

## 2014-03-04 DIAGNOSIS — E1165 Type 2 diabetes mellitus with hyperglycemia: Principal | ICD-10-CM

## 2014-03-04 DIAGNOSIS — E1129 Type 2 diabetes mellitus with other diabetic kidney complication: Secondary | ICD-10-CM

## 2014-03-04 LAB — HEPATIC FUNCTION PANEL
ALK PHOS: 45 U/L (ref 39–117)
ALT: 23 U/L (ref 0–53)
AST: 21 U/L (ref 0–37)
Albumin: 4.1 g/dL (ref 3.5–5.2)
BILIRUBIN DIRECT: 0.1 mg/dL (ref 0.0–0.3)
Total Bilirubin: 0.9 mg/dL (ref 0.3–1.2)
Total Protein: 7.6 g/dL (ref 6.0–8.3)

## 2014-03-04 LAB — LIPID PANEL
Cholesterol: 134 mg/dL (ref 0–200)
HDL: 30.8 mg/dL — ABNORMAL LOW (ref >39.00)
LDL CALC: 89 mg/dL (ref 0–99)
Total CHOL/HDL Ratio: 4
Triglycerides: 71 mg/dL (ref 0.0–149.0)
VLDL: 14.2 mg/dL (ref 0.0–40.0)

## 2014-03-04 LAB — HEMOGLOBIN A1C: Hgb A1c MFr Bld: 6.5 % (ref 4.6–6.5)

## 2014-03-04 LAB — BASIC METABOLIC PANEL
BUN: 17 mg/dL (ref 6–23)
CALCIUM: 9.6 mg/dL (ref 8.4–10.5)
CO2: 31 meq/L (ref 19–32)
CREATININE: 1 mg/dL (ref 0.4–1.5)
Chloride: 106 mEq/L (ref 96–112)
GFR: 77.42 mL/min (ref >60.00)
Glucose, Bld: 107 mg/dL — ABNORMAL HIGH (ref 70–99)
Potassium: 4.4 mEq/L (ref 3.5–5.1)
Sodium: 142 mEq/L (ref 135–145)

## 2014-03-04 LAB — MICROALBUMIN / CREATININE URINE RATIO
Creatinine,U: 174.7 mg/dL
Microalb Creat Ratio: 0.3 mg/g (ref 0.0–30.0)
Microalb, Ur: 0.6 mg/dL (ref 0.0–1.9)

## 2014-03-04 MED ORDER — METFORMIN HCL 500 MG PO TABS: ORAL_TABLET | ORAL | Status: DC

## 2014-03-04 NOTE — Progress Notes (Signed)
Pre visit review using our clinic review tool, if applicable. No additional management support is needed unless otherwise documented below in the visit note. 

## 2014-03-04 NOTE — Progress Notes (Signed)
Subjective:    Patient ID: Frank Carlson, male    DOB: November 22, 1940, 73 y.o.   MRN: [id number]  HPI  He is here to assess active health issues & conditions. PMH, FH, & Social history verified & updated   Diabetes : Last A1c 8.3% (average glucose 216/ risk 66%) FBS range/average:No self monitoring Highest 2 hr post meal glucose: Medication compliance: Yes Hypoglycemia: no episodes Ophthamology care: Last exam year and a half ago Podiatry care: None  HYPERLIPIDEMIA: Disease Monitoring: Medication Compliance:Yes  No PMH of HYPERTENSION:; diastolic was 90 last OV      Review of Systems  Chest pain, palpitations: No      Dyspnea: No Edema:No Claudication:No  Lightheadedness,Syncope:No Weight gain/loss: Lost 35 lbs in the last 3 months with TLC Polyuria/phagia/dipsia: No;nocturia x one at night     Blurred vision /diplopia/lossof vision:No Limb numbness/tingling/burning: Non healing skin lesions:No Abd pain, bowel changes:No   Myalgias: No Memory loss:No        Objective:   Physical Exam Gen.: Healthy and well-nourished in appearance. Alert, appropriate and cooperative throughout exam. Appears younger than stated age  Head: Normocephalic without obvious abnormalities;   Pupils equal round reactive to light and accommodation. Extraocular motion intact.  Ears: External  ear exam reveals no significant lesions or deformities. Canals clear .TMs normal. Nose: External nasal exam reveals no deformity or inflammation. Nasal mucosa are pink and moist. No lesions or exudates noted. Septal deviation to the left  Mouth: Oral mucosa and oropharynx reveal no lesions or exudates. Teeth in good repair. Neck: No deformities, masses, or tenderness noted. Range of motion intact. Thyroid normal. Lungs: Normal respiratory effort; chest expands symmetrically. Lungs are clear to auscultation without rales, wheezes, or increased work of breathing. Heart: Normal rate and rhythm. Normal S1  and S2. No gallop, click, or rub. No murmur. Abdomen: Bowel sounds normal; abdomen soft and nontender. No masses, organomegaly or hernias noted.                              Musculoskeletal/extremities: No deformity or scoliosis noted of  the thoracic or lumbar spine. Crepitus present in BL knees.  No clubbing, cyanosis, edema, or significant extremity  deformity noted. Range of motion normal .Tone & strength normal. Hand joints normal No  DJD DIP changes.  Fingernail / toenail health good. Able to lie down & sit up w/o help. Negative SLR bilaterally Vascular: Carotid, radial artery, dorsalis pedis and  posterior tibial pulses are full and equal. No bruits present. Neurologic: Alert and oriented x3. Deep tendon reflexes symmetrical and normal.  Gait normal  Skin: Intact without suspicious lesions or rashes. Lymph: No cervical, axillary lymphadenopathy present. Psych: Mood and affect are normal. Normally interactive                                                                                        Assessment & Plan:  See Current Assessment & Plan in Problem List under specific DiagnosisThe labs will be reviewed and risks and options assessed. Written recommendations will be provided by mail or directly through  My Chart.Further evaluation or change in medical therapy will be directed by those results.

## 2014-03-04 NOTE — Assessment & Plan Note (Signed)
A1c , urine microalbumin, BMET 

## 2014-03-04 NOTE — Assessment & Plan Note (Signed)
Lipids, LFTs,CK 

## 2014-03-04 NOTE — Patient Instructions (Signed)
Your next office appointment will be determined based upon review of your pending labs. Those instructions will be transmitted to you through My Chart . 

## 2014-03-08 ENCOUNTER — Encounter: Payer: Self-pay | Admitting: Internal Medicine

## 2014-03-24 ENCOUNTER — Encounter: Payer: Self-pay | Admitting: Internal Medicine

## 2014-03-24 ENCOUNTER — Ambulatory Visit (INDEPENDENT_AMBULATORY_CARE_PROVIDER_SITE_OTHER): Payer: Commercial Managed Care - HMO | Admitting: Internal Medicine

## 2014-03-24 VITALS — BP 100/60 | HR 48 | Temp 98.5°F | Ht 73.0 in | Wt 214.0 lb

## 2014-03-24 DIAGNOSIS — E785 Hyperlipidemia, unspecified: Secondary | ICD-10-CM

## 2014-03-24 DIAGNOSIS — N529 Male erectile dysfunction, unspecified: Secondary | ICD-10-CM | POA: Insufficient documentation

## 2014-03-24 DIAGNOSIS — E1142 Type 2 diabetes mellitus with diabetic polyneuropathy: Secondary | ICD-10-CM

## 2014-03-24 DIAGNOSIS — E1149 Type 2 diabetes mellitus with other diabetic neurological complication: Secondary | ICD-10-CM

## 2014-03-24 LAB — HM DIABETES FOOT EXAM

## 2014-03-24 NOTE — Patient Instructions (Signed)
Please start a low dose aspirin every other day.

## 2014-03-24 NOTE — Assessment & Plan Note (Signed)
Lab Results  Component Value Date   HGBA1C 6.5 03/04/2014   Great control with weight loss and metformin

## 2014-03-24 NOTE — Assessment & Plan Note (Signed)
Very early Hopefully won't progress with his great control

## 2014-03-24 NOTE — Progress Notes (Signed)
Subjective:    Patient ID: Frank Carlson, male    DOB: 1941/01/23, 73 y.o.   MRN: [id number]  HPI Transferring care due to Dr Linda Hedges' retirement  Recent diagnosis of DM Had started metformin and good control on test last month Some numbness in feet that started last year  On cholesterol meds  No problems with the med Discussed the rationale of Rx with normal levels  Has some lightheadedness when arising from his recliner No problems coming out of the dentist's chair or getting out of bed No chest pain No SOB Goes to gym 5 days [place]  Ongoing ED Not excited by the response to sildenafil He and wife are able to has satisfaction in their relationship anyway  Current Outpatient Prescriptions on File Prior to Visit  Medication Sig Dispense Refill  . metFORMIN (GLUCOPHAGE) 500 MG tablet Take one tablet once daily  With largest meal  21 tablet  0  . sildenafil (VIAGRA) 100 MG tablet Take 100 mg by mouth as needed.      . simvastatin (ZOCOR) 20 MG tablet Take 1 tablet (20 mg total) by mouth at bedtime. PATIENT NEEDS TO EST NEW PCP FOR FURTHER REFILLS DR NORINS RETIRED  90 tablet  0   No current facility-administered medications on file prior to visit.    No Known Allergies  Past Medical History  Diagnosis Date  . COLONIC POLYPS, HX OF 03/15/2008  . 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 CONCUSSION WITH LOC OF 30 MINUTES OR LESS 11/01/2009  . HEARING LOSS, BILATERAL 11/01/2009  . JOINT STIFFNESS, HAND 10/28/2008  . Type II or unspecified type diabetes mellitus with neurological manifestations, not stated as uncontrolled 1/15  . Hyperlipidemia   . ED (erectile dysfunction)     Past Surgical History  Procedure Laterality Date  . Tonsillectomy    . Incision / drainage hand / finger    . Vasectomy    . Cataract extraction  07/2010    OD with IOL    Family History  Problem Relation Age of Onset  . Alzheimer's disease Mother   . Coronary artery disease Father   .  Coronary artery disease Other   . Diabetes Other     History   Social History  . Marital Status: Married    Spouse Name: Frank Carlson    Number of Children: 3  . Years of Education: 16   Occupational History  . Therapist, occupational     Retired   Social History Main Topics  . Smoking status: Former Smoker -- 2.00 packs/day for 5 years    Types: Cigarettes    Quit date: 11/25/1958  . Smokeless tobacco: Never Used  . Alcohol Use: No  . Drug Use: No  . Sexual Activity: Yes    Partners: Female   Other Topics Concern  . Not on file   Social History Narrative   [employer]- Estate manager/land agent.    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    Married '64. 3 sons- '66, '68, '71; 5 grandchildren, 1 step g-dtr.    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       ACP - established HCPOA; no prolonged heroic measures if not viable. Yes- CPR, yes- short term mechanical ventilation.                  Review of Systems Has noticed a decline in tolerance for weights--since losing weight recently Sleeps well No depression or anhedonia  Objective:   Physical Exam  Constitutional: He is oriented to person, place, and time. He appears well-developed and well-nourished. No distress.  Neck: Normal range of motion. Neck supple. No thyromegaly present.  Cardiovascular: Normal rate, regular rhythm, normal heart sounds and intact distal pulses.  Exam reveals no gallop.   No murmur heard. Pulmonary/Chest: Effort normal and breath sounds normal. No respiratory distress. He has no wheezes. He has no rales.  Abdominal: Soft. There is no tenderness.  Musculoskeletal: He exhibits no edema and no tenderness.  Lymphadenopathy:    He has no cervical adenopathy.  Neurological: He is alert and oriented to person, place, and time.  Decreased sensation in left 5 th toe--plantar   Skin: No rash noted.  No foot ulcers  Psychiatric: He has a normal mood and affect. His behavior is  normal.          Assessment & Plan:

## 2014-03-24 NOTE — Progress Notes (Signed)
Pre visit review using our clinic review tool, if applicable. No additional management support is needed unless otherwise documented below in the visit note. 

## 2014-03-24 NOTE — Assessment & Plan Note (Signed)
Lab Results  Component Value Date   LDLCALC 89 03/04/2014   Good control Also takes fish oil

## 2014-04-06 ENCOUNTER — Ambulatory Visit: Payer: Medicare HMO | Admitting: Family Medicine

## 2014-05-04 ENCOUNTER — Encounter: Payer: Self-pay | Admitting: Internal Medicine

## 2014-05-04 DIAGNOSIS — E7800 Pure hypercholesterolemia, unspecified: Secondary | ICD-10-CM

## 2014-05-05 MED ORDER — SIMVASTATIN 20 MG PO TABS: 20.0000 mg | ORAL_TABLET | Freq: Every day | ORAL | Status: DC

## 2014-08-02 ENCOUNTER — Ambulatory Visit (INDEPENDENT_AMBULATORY_CARE_PROVIDER_SITE_OTHER): Payer: Commercial Managed Care - HMO

## 2014-08-02 DIAGNOSIS — Z23 Encounter for immunization: Secondary | ICD-10-CM

## 2014-09-24 ENCOUNTER — Ambulatory Visit (INDEPENDENT_AMBULATORY_CARE_PROVIDER_SITE_OTHER): Payer: Commercial Managed Care - HMO | Admitting: Internal Medicine

## 2014-09-24 ENCOUNTER — Encounter: Payer: Self-pay | Admitting: Internal Medicine

## 2014-09-24 VITALS — BP 118/60 | HR 58 | Temp 98.0°F | Wt 195.0 lb

## 2014-09-24 DIAGNOSIS — E1142 Type 2 diabetes mellitus with diabetic polyneuropathy: Secondary | ICD-10-CM

## 2014-09-24 DIAGNOSIS — E1149 Type 2 diabetes mellitus with other diabetic neurological complication: Secondary | ICD-10-CM

## 2014-09-24 DIAGNOSIS — E785 Hyperlipidemia, unspecified: Secondary | ICD-10-CM

## 2014-09-24 LAB — HEMOGLOBIN A1C: HEMOGLOBIN A1C: 6.2 % (ref 4.6–6.5)

## 2014-09-24 NOTE — Progress Notes (Signed)
Subjective:    Patient ID: Frank Carlson, male    DOB: Oct 24, 1941, 73 y.o.   MRN: [id number]  HPI Doing well  Has lost 20# Read "Diabetes for Dummies" and is following a healthy diet Continues to exercise regularly  Hasn't been checking sugars No apparent hypoglycemic spells Mild sensory loss still on plantar feet--he is less aware of this now  Does have some lightheadedness after getting up quickly No syncope No chest pain No SOB Exercise tolerance is stable Will get funny sensation on head at times--tingling  Current Outpatient Prescriptions on File Prior to Visit  Medication Sig Dispense Refill  . aspirin 81 MG tablet Take 81 mg by mouth every other day.    . sildenafil (VIAGRA) 100 MG tablet Take 100 mg by mouth as needed.    . simvastatin (ZOCOR) 20 MG tablet Take 1 tablet (20 mg total) by mouth at bedtime. 90 tablet 3   No current facility-administered medications on file prior to visit.    No Known Allergies  Past Medical History  Diagnosis Date  . COLONIC POLYPS, HX OF 03/15/2008  . 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 CONCUSSION WITH LOC OF 30 MINUTES OR LESS 11/01/2009  . HEARING LOSS, BILATERAL 11/01/2009  . JOINT STIFFNESS, HAND 10/28/2008  . Type II or unspecified type diabetes mellitus with neurological manifestations, not stated as uncontrolled 1/15  . Hyperlipidemia   . ED (erectile dysfunction)     Past Surgical History  Procedure Laterality Date  . Tonsillectomy    . Incision / drainage hand / finger    . Vasectomy    . Cataract extraction  07/2010    OD with IOL    Family History  Problem Relation Age of Onset  . Alzheimer's disease Mother   . Coronary artery disease Father   . Coronary artery disease Other   . Diabetes Other     History   Social History  . Marital Status: Married    Spouse Name: Lovey Newcomer    Number of Children: 3  . Years of Education: 16   Occupational History  . Therapist, occupational     Retired   Social  History Main Topics  . Smoking status: Former Smoker -- 2.00 packs/day for 5 years    Types: Cigarettes    Quit date: 11/25/1958  . Smokeless tobacco: Never Used  . Alcohol Use: No  . Drug Use: No  . Sexual Activity:    Partners: Female   Other Topics Concern  . Not on file   Social History Narrative   [employer]- Estate manager/land agent.    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    Married '64. 3 sons- '66, '68, '71; 5 grandchildren, 1 step g-dtr.    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       ACP - established HCPOA; no prolonged heroic measures if not viable. Yes- CPR, yes- short term mechanical ventilation.                  Review of Systems Feels cooler since all the weight loss Sleeps okay--up once for nocturia (will be slow) Appetite is fine    Objective:   Physical Exam  Constitutional: He appears well-developed and well-nourished. No distress.  Neck: Normal range of motion. Neck supple. No thyromegaly present.  Cardiovascular: Normal rate, regular rhythm, normal heart sounds and intact distal pulses.  Exam reveals no gallop.   No murmur heard. Pulmonary/Chest: Effort normal and breath sounds normal. No respiratory distress.  He has no wheezes. He has no rales.  Musculoskeletal: He exhibits no edema or tenderness.  Lymphadenopathy:    He has no cervical adenopathy.  Skin: No rash noted.  No foot lesions  Psychiatric: He has a normal mood and affect. His behavior is normal.          Assessment & Plan:

## 2014-09-24 NOTE — Assessment & Plan Note (Signed)
No problems with statin 

## 2014-09-24 NOTE — Assessment & Plan Note (Signed)
Improved His sensation is better now in feet

## 2014-09-24 NOTE — Assessment & Plan Note (Signed)
Seems to be well controlled with his weight loss Will recheck a1c

## 2014-09-24 NOTE — Progress Notes (Signed)
Pre visit review using our clinic review tool, if applicable. No additional management support is needed unless otherwise documented below in the visit note. 

## 2015-03-04 ENCOUNTER — Encounter: Payer: Self-pay | Admitting: Internal Medicine

## 2015-03-04 DIAGNOSIS — E7800 Pure hypercholesterolemia, unspecified: Secondary | ICD-10-CM

## 2015-03-04 MED ORDER — SIMVASTATIN 20 MG PO TABS: 20.0000 mg | ORAL_TABLET | Freq: Every day | ORAL | Status: DC

## 2015-03-16 DIAGNOSIS — H524 Presbyopia: Secondary | ICD-10-CM | POA: Diagnosis not present

## 2015-03-16 DIAGNOSIS — H5210 Myopia, unspecified eye: Secondary | ICD-10-CM | POA: Diagnosis not present

## 2015-03-17 DIAGNOSIS — Z0100 Encounter for examination of eyes and vision without abnormal findings: Secondary | ICD-10-CM | POA: Diagnosis not present

## 2015-03-17 LAB — HM DIABETES EYE EXAM

## 2015-03-23 ENCOUNTER — Encounter: Payer: Self-pay | Admitting: Internal Medicine

## 2015-03-28 ENCOUNTER — Encounter: Payer: Self-pay | Admitting: Internal Medicine

## 2015-03-28 ENCOUNTER — Ambulatory Visit (INDEPENDENT_AMBULATORY_CARE_PROVIDER_SITE_OTHER): Payer: Commercial Managed Care - HMO | Admitting: Internal Medicine

## 2015-03-28 VITALS — BP 130/68 | HR 48 | Temp 98.2°F | Ht 73.0 in | Wt 201.0 lb

## 2015-03-28 DIAGNOSIS — H9113 Presbycusis, bilateral: Secondary | ICD-10-CM

## 2015-03-28 DIAGNOSIS — Z7189 Other specified counseling: Secondary | ICD-10-CM | POA: Diagnosis not present

## 2015-03-28 DIAGNOSIS — E785 Hyperlipidemia, unspecified: Secondary | ICD-10-CM

## 2015-03-28 DIAGNOSIS — Z0000 Encounter for general adult medical examination without abnormal findings: Secondary | ICD-10-CM

## 2015-03-28 DIAGNOSIS — E1149 Type 2 diabetes mellitus with other diabetic neurological complication: Secondary | ICD-10-CM | POA: Diagnosis not present

## 2015-03-28 LAB — COMPREHENSIVE METABOLIC PANEL
ALT: 19 U/L (ref 0–53)
AST: 20 U/L (ref 0–37)
Albumin: 4 g/dL (ref 3.5–5.2)
Alkaline Phosphatase: 47 U/L (ref 39–117)
BUN: 14 mg/dL (ref 6–23)
CO2: 32 meq/L (ref 19–32)
CREATININE: 0.98 mg/dL (ref 0.40–1.50)
Calcium: 9.7 mg/dL (ref 8.4–10.5)
Chloride: 103 mEq/L (ref 96–112)
GFR: 79.02 mL/min (ref >60.00)
Glucose, Bld: 97 mg/dL (ref 70–99)
Potassium: 4.2 mEq/L (ref 3.5–5.1)
Sodium: 139 mEq/L (ref 135–145)
Total Bilirubin: 0.4 mg/dL (ref 0.2–1.2)
Total Protein: 7.2 g/dL (ref 6.0–8.3)

## 2015-03-28 LAB — LIPID PANEL
Cholesterol: 123 mg/dL (ref 0–200)
HDL: 33.3 mg/dL — ABNORMAL LOW (ref >39.00)
LDL Cholesterol: 75 mg/dL (ref 0–99)
NonHDL: 89.7
TRIGLYCERIDES: 75 mg/dL (ref 0.0–149.0)
Total CHOL/HDL Ratio: 4
VLDL: 15 mg/dL (ref 0.0–40.0)

## 2015-03-28 LAB — CBC WITH DIFFERENTIAL/PLATELET
BASOS ABS: 0 10*3/uL (ref 0.0–0.1)
Basophils Relative: 0.3 % (ref 0.0–3.0)
Eosinophils Absolute: 0 10*3/uL (ref 0.0–0.7)
Eosinophils Relative: 0.3 % (ref 0.0–5.0)
HCT: 42.2 % (ref 39.0–52.0)
Hemoglobin: 14.3 g/dL (ref 13.0–17.0)
Lymphocytes Relative: 35.9 % (ref 12.0–46.0)
Lymphs Abs: 2 10*3/uL (ref 0.7–4.0)
MCHC: 34 g/dL (ref 30.0–36.0)
MCV: 87.1 fl (ref 78.0–100.0)
MONOS PCT: 9.6 % (ref 3.0–12.0)
Monocytes Absolute: 0.5 10*3/uL (ref 0.1–1.0)
Neutro Abs: 3 10*3/uL (ref 1.4–7.7)
Neutrophils Relative %: 53.9 % (ref 43.0–77.0)
PLATELETS: 149 10*3/uL — AB (ref 150.0–400.0)
RBC: 4.84 Mil/uL (ref 4.22–5.81)
RDW: 14 % (ref 11.5–15.5)
WBC: 5.6 10*3/uL (ref 4.0–10.5)

## 2015-03-28 LAB — MICROALBUMIN / CREATININE URINE RATIO
CREATININE, U: 62.5 mg/dL
MICROALB/CREAT RATIO: 1.1 mg/g (ref 0.0–30.0)
Microalb, Ur: <0.7 mg/dL (ref 0.0–1.9)

## 2015-03-28 LAB — HEMOGLOBIN A1C: HEMOGLOBIN A1C: 6.2 % (ref 4.6–6.5)

## 2015-03-28 LAB — T4, FREE: Free T4: 0.79 ng/dL (ref 0.60–1.60)

## 2015-03-28 LAB — HM DIABETES FOOT EXAM

## 2015-03-28 MED ORDER — BAYER MICROLET LANCETS MISC: Status: DC

## 2015-03-28 MED ORDER — GLUCOSE BLOOD VI STRP: ORAL_STRIP | Status: DC

## 2015-03-28 NOTE — Progress Notes (Signed)
Subjective:    Patient ID: Frank Carlson, male    DOB: [birth year], 74 y.o.   MRN: [id number]  HPI Here for Medicare wellness visit and follow up on chronic medical problems Reviewed form and advanced directives Reviewed other physicians No tobacco or alcohol No falls No depression or anhedonia Vision is fine Hearing aides bilaterally and does okay with those Independent with instrumental ADLs No apparent cognitive decline  Still not checking sugars No apparent hypoglycemic reactions Keeps up with eye exam Very slight sensory changes in feet now--no pain or ulcers (some tingling mostly)  Chronic bradycardia Will get mild dizziness upon standing--- resolves quickly if he waits a minute No trouble with exercise--he is fairly aggressive No chest pain No palpitations No edema  No problems with statin No muscle aches  No GI problems  Some trouble voiding Very slow stream and has to go right back (incomplete emptying) No problems during the day  Current Outpatient Prescriptions on File Prior to Visit  Medication Sig Dispense Refill  . aspirin 81 MG tablet Take 81 mg by mouth every other day.    . metFORMIN (GLUCOPHAGE) 500 MG tablet Take 500 mg by mouth daily with breakfast.    . simvastatin (ZOCOR) 20 MG tablet Take 1 tablet (20 mg total) by mouth at bedtime. 90 tablet 3   No current facility-administered medications on file prior to visit.    No Known Allergies  Past Medical History  Diagnosis Date  . COLONIC POLYPS, HX OF 03/15/2008  . 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 CONCUSSION WITH LOC OF 30 MINUTES OR LESS 11/01/2009  . HEARING LOSS, BILATERAL 11/01/2009  . JOINT STIFFNESS, HAND 10/28/2008  . Type II or unspecified type diabetes mellitus with neurological manifestations, not stated as uncontrolled 1/15  . Hyperlipidemia   . ED (erectile dysfunction)     Past Surgical History  Procedure Laterality Date  . Tonsillectomy    . Incision / drainage hand / finger     . Vasectomy    . Cataract extraction  07/2010    OD with IOL    Family History  Problem Relation Age of Onset  . Alzheimer's disease Mother   . Coronary artery disease Father   . Coronary artery disease Other   . Diabetes Other     History   Social History  . Marital Status: Married    Spouse Name: Lovey Newcomer  . Number of Children: 3  . Years of Education: 16   Occupational History  . Therapist, occupational     Retired   Social History Main Topics  . Smoking status: Former Smoker -- 2.00 packs/day for 5 years    Types: Cigarettes    Quit date: 11/25/1958  . Smokeless tobacco: Never Used  . Alcohol Use: No  . Drug Use: No  . Sexual Activity:    Partners: Female   Other Topics Concern  . Not on file   Social History Narrative   [employer]- Estate manager/land agent.    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    Married '64. 3 sons- '66, '68, '71; 5 grandchildren, 1 step g-dtr.       Has living will   Wife is health care POA   Would accept resuscitation attempts but no prolonged ventilatory support   Probably wouldn't want prolonged tube feeds   Review of Systems Continues on healthy eating pattern--did gain about 6# back Generally sleeps okay No sores or rash. Does get some chaffing on heels Teeth  are okay--regular with the dentist Wears seat belt Bowels are okay    Objective:   Physical Exam  Constitutional: He is oriented to person, place, and time. He appears well-developed and well-nourished. No distress.  HENT:  Mouth/Throat: Oropharynx is clear and moist. No oropharyngeal exudate.  Neck: Normal range of motion. Neck supple. No thyromegaly present.  Cardiovascular: Normal rate, regular rhythm, normal heart sounds and intact distal pulses.  Exam reveals no gallop.   No murmur heard. Pulmonary/Chest: Effort normal and breath sounds normal. No respiratory distress. He has no wheezes. He has no rales.  Abdominal: Soft. There is no tenderness.    Musculoskeletal: He exhibits no edema or tenderness.  Lymphadenopathy:    He has no cervical adenopathy.  Neurological: He is alert and oriented to person, place, and time.  President-- "Obama, Bush, Clinton, [place]" [phone] D-[id number]-w Recall 2/3  ?slight decreased sensation in feet  Skin: Skin is warm. No rash noted. No erythema.  No foot lesions  Psychiatric: He has a normal mood and affect. His behavior is normal.          Assessment & Plan:

## 2015-03-28 NOTE — Progress Notes (Signed)
Pre visit review using our clinic review tool, if applicable. No additional management support is needed unless otherwise documented below in the visit note. 

## 2015-03-28 NOTE — Assessment & Plan Note (Signed)
No problems with statin Due for labs 

## 2015-03-28 NOTE — Assessment & Plan Note (Signed)
See social history 

## 2015-03-28 NOTE — Assessment & Plan Note (Signed)
Minimal neuropathy Seems to still have good control If A1c still normal--will defer next visit till 1 year Machine given and taught to do his own fingerstick---discussed to do once or twice a year

## 2015-03-28 NOTE — Assessment & Plan Note (Signed)
Does okay with the hearing aides

## 2015-03-28 NOTE — Addendum Note (Signed)
Addended by: Despina Hidden on: 03/28/2015 12:54 PM   Modules accepted: Orders, Medications

## 2015-03-28 NOTE — Assessment & Plan Note (Signed)
I have personally reviewed the Medicare Annual Wellness questionnaire and have noted 1. The patient's medical and social history 2. Their use of alcohol, tobacco or illicit drugs 3. Their current medications and supplements 4. The patient's functional ability including ADL's, fall risks, home safety risks and hearing or visual             impairment. 5. Diet and physical activities 6. Evidence for depression or mood disorders  The patients weight, height, BMI and visual acuity have been recorded in the chart I have made referrals, counseling and provided education to the patient based review of the above and I have provided the pt with a written personalized care plan for preventive services.  I have provided you with a copy of your personalized plan for preventive services. Please take the time to review along with your updated medication list.  UTD on immunizations Yearly flu shot NO PSA due to age Colonoscopy 2011--probably shouldn't get another

## 2015-04-01 ENCOUNTER — Encounter: Payer: Self-pay | Admitting: Internal Medicine

## 2015-04-04 MED ORDER — METFORMIN HCL 500 MG PO TABS: 500.0000 mg | ORAL_TABLET | Freq: Every day | ORAL | Status: DC

## 2015-05-03 ENCOUNTER — Encounter: Payer: Self-pay | Admitting: Internal Medicine

## 2015-06-13 ENCOUNTER — Encounter: Payer: Self-pay | Admitting: Internal Medicine

## 2015-06-15 ENCOUNTER — Emergency Department (HOSPITAL_COMMUNITY)
Admission: EM | Admit: 2015-06-15 | Discharge: 2015-06-15 | Disposition: A | Discharge status: Home / Self Care | Payer: Commercial Managed Care - HMO | Attending: Emergency Medicine | Admitting: Emergency Medicine

## 2015-06-15 ENCOUNTER — Other Ambulatory Visit: Payer: Self-pay

## 2015-06-15 DIAGNOSIS — H9190 Unspecified hearing loss, unspecified ear: Secondary | ICD-10-CM | POA: Diagnosis not present

## 2015-06-15 DIAGNOSIS — T63484A Toxic effect of venom of other arthropod, undetermined, initial encounter: Secondary | ICD-10-CM | POA: Diagnosis not present

## 2015-06-15 DIAGNOSIS — Y998 Other external cause status: Secondary | ICD-10-CM | POA: Insufficient documentation

## 2015-06-15 DIAGNOSIS — E1149 Type 2 diabetes mellitus with other diabetic neurological complication: Secondary | ICD-10-CM | POA: Diagnosis not present

## 2015-06-15 DIAGNOSIS — T782XXA Anaphylactic shock, unspecified, initial encounter: Secondary | ICD-10-CM | POA: Diagnosis not present

## 2015-06-15 DIAGNOSIS — Z8739 Personal history of other diseases of the musculoskeletal system and connective tissue: Secondary | ICD-10-CM | POA: Insufficient documentation

## 2015-06-15 DIAGNOSIS — Z86010 Personal history of colonic polyps: Secondary | ICD-10-CM | POA: Diagnosis not present

## 2015-06-15 DIAGNOSIS — X58XXXA Exposure to other specified factors, initial encounter: Secondary | ICD-10-CM | POA: Diagnosis not present

## 2015-06-15 DIAGNOSIS — T63301A Toxic effect of unspecified spider venom, accidental (unintentional), initial encounter: Secondary | ICD-10-CM | POA: Diagnosis not present

## 2015-06-15 DIAGNOSIS — Y9289 Other specified places as the place of occurrence of the external cause: Secondary | ICD-10-CM | POA: Diagnosis not present

## 2015-06-15 DIAGNOSIS — Y9389 Activity, other specified: Secondary | ICD-10-CM | POA: Insufficient documentation

## 2015-06-15 DIAGNOSIS — Z87891 Personal history of nicotine dependence: Secondary | ICD-10-CM | POA: Insufficient documentation

## 2015-06-15 DIAGNOSIS — T7805XA Anaphylactic reaction due to tree nuts and seeds, initial encounter: Secondary | ICD-10-CM | POA: Insufficient documentation

## 2015-06-15 LAB — I-STAT CHEM 8, ED
BUN: 20 mg/dL (ref 6–20)
Calcium, Ion: 1.19 mmol/L (ref 1.13–1.30)
Chloride: 101 mmol/L (ref 101–111)
Creatinine, Ser: 1.1 mg/dL (ref 0.61–1.24)
Glucose, Bld: 135 mg/dL — ABNORMAL HIGH (ref 65–99)
HEMATOCRIT: 41 % (ref 39.0–52.0)
HEMOGLOBIN: 13.9 g/dL (ref 13.0–17.0)
POTASSIUM: 4 mmol/L (ref 3.5–5.1)
Sodium: 143 mmol/L (ref 135–145)
TCO2: 28 mmol/L (ref 0–100)

## 2015-06-15 LAB — CBG MONITORING, ED: GLUCOSE-CAPILLARY: 139 mg/dL — AB (ref 65–99)

## 2015-06-15 MED ORDER — PREDNISONE 20 MG PO TABS: 40.0000 mg | ORAL_TABLET | Freq: Every day | ORAL | Status: DC

## 2015-06-15 MED ORDER — SODIUM CHLORIDE 0.9 % IV BOLUS (SEPSIS)
1000.0000 mL | Freq: Once | INTRAVENOUS | Status: AC
  Administered 2015-06-15: 1000 mL via INTRAVENOUS

## 2015-06-15 MED ORDER — EPINEPHRINE 0.3 MG/0.3ML IJ SOAJ: 0.3000 mg | Freq: Once | INTRAMUSCULAR | Status: DC

## 2015-06-15 NOTE — ED Provider Notes (Signed)
History   Chief Complaint  Patient presents with  . Loss of Consciousness  . Allergic Reaction    HPI  74 year old male past medical history as below notable for diabetes who presents to ED after being stung by several bees and passing out while cutting grass today. Apparently neighbors found patient unresponsive and called 911. EMS found patient to be unresponsive, blood pressure of 60/40. They gave patient 1 L of fluids, Zantac, Benadryl, Zofran and patient became alert and oriented 4. Patient remained stable during transport. Patient did have one episode of emesis during transport. Patient denies having any other symptoms prior to this. He remembers being stung by bees and swatting them off and subsequently out. He also denies having any prior syncopal episodes in the past. Denies any cardiac or neurologic disease. Patient says he did eat prior to this. EMS reports patient's sugar was 100. There is no report of any rash or hives. Patient denies any abdominal pain, nausea, vomiting currently. Denies any throat swelling, respiratory symptoms. Prior to this he was in his usual state of health.  Past medical/surgical history, social history, medications, allergies and FH have been reviewed with patient and/or in documentation. Furthermore, if pt family or friend(s) present, additional historical information was obtained from them.  Past Medical History  Diagnosis Date  . COLONIC POLYPS, HX OF 03/15/2008  . 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 CONCUSSION WITH LOC OF 30 MINUTES OR LESS 11/01/2009  . HEARING LOSS, BILATERAL 11/01/2009  . JOINT STIFFNESS, HAND 10/28/2008  . Type II or unspecified type diabetes mellitus with neurological manifestations, not stated as uncontrolled 1/15  . Hyperlipidemia   . ED (erectile dysfunction)    Past Surgical History  Procedure Laterality Date  . Tonsillectomy    . Incision / drainage hand / finger    . Vasectomy    . Cataract extraction  07/2010    OD with  IOL   Family History  Problem Relation Age of Onset  . Alzheimer's disease Mother   . Coronary artery disease Father   . Coronary artery disease Other   . Diabetes Other    History  Substance Use Topics  . Smoking status: Former Smoker -- 2.00 packs/day for 5 years    Types: Cigarettes    Quit date: 11/25/1958  . Smokeless tobacco: Never Used  . Alcohol Use: No     Review of Systems Constitutional: - F/C, -fatigue.  HENT: - congestion, -rhinorrhea, -sore throat.   Eyes: - eye pain, -visual disturbance.  Respiratory: - cough, -SOB, -hemoptysis.   Cardiovascular: - CP, -palps.  Gastrointestinal: - N/V/D, -abd pain  Genitourinary: - flank pain, -dysuria, -frequency.  Musculoskeletal: - myalgia/arthritis, -joint swelling, -gait abnormality, -back pain, -neck pain/stiffness, -leg pain/swelling.  Skin: + insect bite. Neurological: - focal weakness, -lightheadedness, -dizziness, -numbness, -HA. + syncope All other systems reviewed and are negative.   Physical Exam  Physical Exam  ED Triage Vitals  Enc Vitals Group     BP 06/15/15 1430 123/66 mmHg     Pulse Rate 06/15/15 1430 69     Resp 06/15/15 1430 13     Temp --      Temp src --      SpO2 06/15/15 1418 100 %     Weight --      Height --      Head Cir --      Peak Flow --      Pain Score --      Pain  Loc --      Pain Edu? --      Excl. in [place]? --    Constitutional: Patient is well appearing 74 yo male and in no acute distress Head: Normocephalic and atraumatic.  Eyes: Extraocular motion intact, no scleral icterus Mouth: MMM, OP clear Neck: Supple without meningismus, mass, or overt JVD Respiratory: No respiratory distress. Normal WOB. No w/r/g. CV: RRR, no obvious murmurs.  Pulses +2 and symmetric. Euvolemic Abdomen: Soft, NT, ND, no r/g. No mass.  MSK: Extremities are atraumatic without deformity, ROM intact Skin: Warm, dry, intact without rash Neuro: AAOx4, MAE 5/5 sym, no focal deficit noted   ED Course   Procedures   Labs Reviewed  CBG MONITORING, ED - Abnormal; Notable for the following:    Glucose-Capillary 139 (*)    All other components within normal limits  I-STAT CHEM 8, ED - Abnormal; Notable for the following:    Glucose, Bld 135 (*)    All other components within normal limits   I personally reviewed and interpreted all labs. Filed Vitals:   06/15/15 1539 06/15/15 1600 06/15/15 1618 06/15/15 1630  BP: 123/58 115/54 115/54 115/54  Pulse: 68 65 65 65  Resp: 9 22 22 12   SpO2: 99% 98% 98% 98%      EKG Interpretation  Date/Time:  Wednesday June 15 2015 16:44:19 EDT Ventricular Rate:  63 PR Interval:  175 QRS Duration: 117 QT Interval:  421 QTC Calculation: 431 R Axis:   78 Text Interpretation:  Sinus rhythm Incomplete right bundle branch block No significant change since last tracing Confirmed by Canary Brim  MD, MARTHA [id number]) on 06/15/2015 4:47:48 PM       MDM: [name] is a 74 y.o. male with H&P as above who p/w CC: insect bites, syncope.  -Initial impression: On arrival, patient is hemodynamically stable and in no apparent distress. His blood pressure is 263 systolic. He is not tachycardic. Patient is currently without symptoms. Clinical picture is concerning for anaphylaxis on the patient has no history of this in the past or other allergic reactions. Patient improved without treatment of epinephrine. No indication for epinephrine in ED currently. We'll check screening electrolytes, EKG, and administered IV fluids. EKG shows incomplete right bundle branch block and no signs of acute ischemia, long QT, HOCM, Brugada, WPW and is unchanged from prior EKG. Electrolytes are normal. Hemoglobin is also normal. Orthostatics are unremarkable. Pt likely had an anaphylaxis reaction causing him to become hypotensive and syncopize. Patient reports he feels completely normal now. Patient has been hemodynamically stable and in no distress. There are no signs of recurrence of  symptoms. Patient is deemed stable for discharge. Patient will be given a EpiPen and sterilely burst prescriptions. He is advised to follow closely with his PCP in one week.  Old records reviewed (if available). Labs and imaging reviewed personally by myself and considered in medical decision making if ordered. Clinical Impression: 1. Anaphylactic reaction, initial encounter     Disposition: Discharge  Condition: Good  I have discussed the results, Dx and Tx plan with the pt(& family if present). He/she/they expressed understanding and agree(s) with the plan. Discharge instructions discussed at great length. Strict return precautions discussed and pt &/or family have verbalized understanding of the instructions. No further questions at time of discharge.    New Prescriptions   EPINEPHRINE (EPIPEN 2-PAK) 0.3 MG/0.3 ML IJ SOAJ INJECTION    Inject 0.3 mLs (0.3 mg total) into the muscle once.  PREDNISONE (DELTASONE) 20 MG TABLET    Take 2 tablets (40 mg total) by mouth daily.    Follow Up: Venia Carbon, MD [place] [id number] [phone]  Schedule an appointment as soon as possible for a visit in 1 week   [facility] [address] [phone] [place] [phone]  If symptoms worsen   Pt seen in conjunction with Dr. Alfonzo Beers, MD  Kirstie Peri, [place] [facility] Resident - PGY-3      Kirstie Peri, MD 06/15/15 [place], MD 06/15/15 [phone]

## 2015-06-15 NOTE — ED Notes (Signed)
Pt here from home. EMS reports pt was mowing lawn when he ran over bee nest, pt was stung approx 3 times. Neighbor called 911 because pt was found unresponsive on lawn mower. Upon EMS arrival, pt was pale and diaphoretic at 60/40 with urinary incontinence. No resp symptoms. Pt was moved into truck for transport and had a large episode of vomiting. EMS gave 50 diphenhydramine, 50 zantac, 4 zofran, 1000 NS PTA. A/o at this time.

## 2015-06-15 NOTE — Discharge Instructions (Signed)
Anaphylactic Reaction °An anaphylactic reaction is a sudden, severe allergic reaction. It affects the whole body. It can be life threatening. You may need to stay in the hospital.  °HOME CARE °· Wear a medical bracelet or necklace that lists your allergy. °· Carry your allergy kit or medicine shot to treat severe allergic reactions with you. These can save your life. °· Do not drive until medicine from your shot has worn off, unless your doctor says it is okay. °· If you have hives or a rash: °¨ Take medicine as told by your doctor. °¨ You may take over-the-counter antihistamine medicine. °¨ Place cold cloths on your skin. Take baths in cool water. Avoid hot baths and hot showers. °GET HELP RIGHT AWAY IF:  °· Your mouth is puffy (swollen), or you have trouble breathing. °· You start making whistling sounds when you breathe (wheezing). °· You have a tight feeling in your chest or throat. °· You have a rash, hives, puffiness, or itching on your body. °· You throw up (vomit) or have watery poop (diarrhea). °· You feel dizzy or pass out (faint). °· You think you are having an allergic reaction. °· You have new symptoms. °This is an emergency. Use your medicine shot or allergy kit as told. Call your local emergency services (911 in [place] Even if you feel better after the shot, you need to go to the hospital emergency department. °MAKE SURE YOU:  °· Understand these instructions. °· Will watch your condition. °· Will get help right away if you are not doing well or get worse. °Document Released: 04/16/2008 Document Revised: 04/29/2012 Document Reviewed: 01/30/2012 °Exit[employer]® Patient Information ©2015 [employer]. This information is not intended to replace advice given to you by your health care provider. Make sure you discuss any questions you have with your health care provider. ° °

## 2015-06-16 ENCOUNTER — Encounter: Payer: Self-pay | Admitting: Internal Medicine

## 2015-06-16 DIAGNOSIS — T7840XA Allergy, unspecified, initial encounter: Secondary | ICD-10-CM

## 2015-06-16 NOTE — Telephone Encounter (Signed)
Please call him tomorrow to set up blood work (non fasting) The order is in

## 2015-06-17 ENCOUNTER — Other Ambulatory Visit (INDEPENDENT_AMBULATORY_CARE_PROVIDER_SITE_OTHER): Payer: Commercial Managed Care - HMO

## 2015-06-17 DIAGNOSIS — T7840XA Allergy, unspecified, initial encounter: Secondary | ICD-10-CM

## 2015-06-17 NOTE — Telephone Encounter (Signed)
Spoke with patient and advised results  Lab appt scheduled  

## 2015-06-20 ENCOUNTER — Other Ambulatory Visit: Payer: Self-pay | Admitting: Internal Medicine

## 2015-06-20 DIAGNOSIS — T782XXA Anaphylactic shock, unspecified, initial encounter: Secondary | ICD-10-CM

## 2015-06-20 DIAGNOSIS — T63481S Toxic effect of venom of other arthropod, accidental (unintentional), sequela: Secondary | ICD-10-CM

## 2015-06-20 DIAGNOSIS — T63481A Toxic effect of venom of other arthropod, accidental (unintentional), initial encounter: Secondary | ICD-10-CM | POA: Insufficient documentation

## 2015-06-20 DIAGNOSIS — T782XXS Anaphylactic shock, unspecified, sequela: Principal | ICD-10-CM

## 2015-06-20 LAB — ALLERGEN HYMENOPTERA PANEL
Paper Wasp IgE: 0.13 kU/L — ABNORMAL HIGH
White Hornet IgE: 0.18 kU/L — ABNORMAL HIGH
Yellow Jacket IgE: 0.53 kU/L — ABNORMAL HIGH

## 2015-07-19 ENCOUNTER — Encounter: Payer: Self-pay | Admitting: Internal Medicine

## 2015-07-31 ENCOUNTER — Encounter: Payer: Self-pay | Admitting: Internal Medicine

## 2015-08-01 DIAGNOSIS — J309 Allergic rhinitis, unspecified: Secondary | ICD-10-CM | POA: Diagnosis not present

## 2015-08-01 DIAGNOSIS — J300 Vasomotor rhinitis: Secondary | ICD-10-CM | POA: Diagnosis not present

## 2015-08-01 DIAGNOSIS — Z91030 Bee allergy status: Secondary | ICD-10-CM | POA: Diagnosis not present

## 2015-08-02 ENCOUNTER — Encounter: Payer: Self-pay | Admitting: Internal Medicine

## 2015-08-02 ENCOUNTER — Ambulatory Visit (INDEPENDENT_AMBULATORY_CARE_PROVIDER_SITE_OTHER): Payer: Commercial Managed Care - HMO | Admitting: Internal Medicine

## 2015-08-02 VITALS — BP 120/70 | HR 62 | Wt 203.0 lb

## 2015-08-02 DIAGNOSIS — Z23 Encounter for immunization: Secondary | ICD-10-CM

## 2015-08-02 DIAGNOSIS — K4090 Unilateral inguinal hernia, without obstruction or gangrene, not specified as recurrent: Secondary | ICD-10-CM | POA: Diagnosis not present

## 2015-08-02 NOTE — Assessment & Plan Note (Signed)
This is large enough to require repair Will set up visit with surgeon

## 2015-08-02 NOTE — Progress Notes (Signed)
Subjective:    Patient ID: Frank Carlson, male    DOB: Jan 13, 1941, 74 y.o.   MRN: [id number]  HPI Concerned about a possible hernia  First noticed bulge in right groin about a week ago Doesn't remember any great strain--but had helped moved some tables a week before He is able to push it back in easily Gets pressure sensation but not pain Not into scrotum  Current Outpatient Prescriptions on File Prior to Visit  Medication Sig Dispense Refill  . aspirin 81 MG tablet Take 81 mg by mouth every other day.    Marland Kitchen BAYER MICROLET LANCETS lancets Use as instructed to test blood sugar once daily dx: E11.49 100 each 0  . EPINEPHrine (EPIPEN 2-PAK) 0.3 mg/0.3 mL IJ SOAJ injection Inject 0.3 mLs (0.3 mg total) into the muscle once. 1 Device 1  . glucose blood test strip Use as instructed to test blood sugar once daily dx:E11.49 100 each 0  . metFORMIN (GLUCOPHAGE) 500 MG tablet Take 1 tablet (500 mg total) by mouth daily with breakfast. 90 tablet 3  . simvastatin (ZOCOR) 20 MG tablet Take 1 tablet (20 mg total) by mouth at bedtime. 90 tablet 3   No current facility-administered medications on file prior to visit.    No Known Allergies  Past Medical History  Diagnosis Date  . COLONIC POLYPS, HX OF 03/15/2008  . 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 CONCUSSION WITH LOC OF 30 MINUTES OR LESS 11/01/2009  . HEARING LOSS, BILATERAL 11/01/2009  . JOINT STIFFNESS, HAND 10/28/2008  . Type II or unspecified type diabetes mellitus with neurological manifestations, not stated as uncontrolled 1/15  . Hyperlipidemia   . ED (erectile dysfunction)     Past Surgical History  Procedure Laterality Date  . Tonsillectomy    . Incision / drainage hand / finger    . Vasectomy    . Cataract extraction  07/2010    OD with IOL    Family History  Problem Relation Age of Onset  . Alzheimer's disease Mother   . Coronary artery disease Father   . Coronary artery disease Other   . Diabetes Other     Social  History   Social History  . Marital Status: Married    Spouse Name: Lovey Newcomer  . Number of Children: 3  . Years of Education: 16   Occupational History  . Therapist, occupational     Retired   Social History Main Topics  . Smoking status: Former Smoker -- 2.00 packs/day for 5 years    Types: Cigarettes    Quit date: 11/25/1958  . Smokeless tobacco: Never Used  . Alcohol Use: No  . Drug Use: No  . Sexual Activity:    Partners: Female   Other Topics Concern  . Not on file   Social History Narrative   [employer]- Estate manager/land agent.    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    Married '64. 3 sons- '66, '68, '71; 5 grandchildren, 1 step g-dtr.       Has living will   Wife is health care POA   Would accept resuscitation attempts but no prolonged ventilatory support   Probably wouldn't want prolonged tube feeds   Review of Systems  Voids okay Bowels are slow at times--occasionally have to push hard     Objective:   Physical Exam  Genitourinary:  Moderate right inguinal hernia Reducible Not into scrotum          Assessment & Plan:

## 2015-08-02 NOTE — Progress Notes (Signed)
Pre visit review using our clinic review tool, if applicable. No additional management support is needed unless otherwise documented below in the visit note. 

## 2015-08-02 NOTE — Addendum Note (Signed)
Addended by: Despina Hidden on: 08/02/2015 12:05 PM   Modules accepted: Orders

## 2015-08-09 ENCOUNTER — Ambulatory Visit: Payer: Self-pay | Admitting: General Surgery

## 2015-08-09 DIAGNOSIS — K4090 Unilateral inguinal hernia, without obstruction or gangrene, not specified as recurrent: Secondary | ICD-10-CM | POA: Diagnosis not present

## 2015-08-09 NOTE — H&P (Signed)
History of Present Illness Ralene Ok MD; 08/09/2015 1:34 PM) Patient words: Evaluate inguinal hernia.  The patient is a 74 year old male who presents with an inguinal hernia. Patient is a 74 year old male who is referred by Dr. Delfino Lovett left back for an evaluation of a right inguinal hernia. The patient states that the hernia is been there for approximately 2 weeks. The patient is very active and walks for approximately 15 minutes at a gym as well as works out for approximately 30 minutes thereafter. The patient is very active. He states that the hernia is interfering with his workout routine. The patient states he is able to manually reduce the hernia. The patient has had no signs or symptoms of incarceration or strangulation.   Other Problems Ivor Costa, [place]; 08/09/2015 1:22 PM) Diabetes Mellitus Hypercholesterolemia Inguinal Hernia  Past Surgical History Ivor Costa, [place]; 08/09/2015 1:22 PM) Cataract Surgery Bilateral. Colon Polyp Removal - Colonoscopy Tonsillectomy  Diagnostic Studies History Ivor Costa, CMA; 08/09/2015 1:22 PM) Colonoscopy 1-5 years ago  Allergies Ivor Costa, [place]; 08/09/2015 1:22 PM) No Known Drug Allergies09/27/2016  Medication History Ivor Costa, CMA; 08/09/2015 1:22 PM) Pleas Patricia Contour Next Test (In Vitro) Active. Software engineer. EpiPen 2-Pak (0.3MG /0.3ML Soln Auto-inj, Injection) Active. PredniSONE (20MG  Tablet, Oral) Active. Medications Reconciled  Social History Ivor Costa, [place]; 08/09/2015 1:22 PM) Alcohol use Remotely quit alcohol use. Caffeine use Carbonated beverages. No drug use Tobacco use Former smoker.  Family History Ivor Costa, [place]; 08/09/2015 1:22 PM) Alcohol Abuse Mother, Sister. Diabetes Mellitus Brother. Heart Disease Father. Heart disease in male family member before age 74 Thyroid problems Brother.  Review of Systems Ivor Costa CMA; 08/09/2015 1:22 PM) General  Not Present- Appetite Loss, Chills, Fatigue, Fever, Night Sweats, Weight Gain and Weight Loss. Skin Not Present- Change in Wart/Mole, Dryness, Hives, Jaundice, New Lesions, Non-Healing Wounds, Rash and Ulcer. HEENT Present- Hearing Loss and Wears glasses/contact lenses. Not Present- Earache, Hoarseness, Nose Bleed, Oral Ulcers, Ringing in the Ears, Seasonal Allergies, Sinus Pain, Sore Throat, Visual Disturbances and Yellow Eyes. Respiratory Not Present- Bloody sputum, Chronic Cough, Difficulty Breathing, Snoring and Wheezing. Breast Not Present- Breast Mass, Breast Pain, Nipple Discharge and Skin Changes. Cardiovascular Not Present- Chest Pain, Difficulty Breathing Lying Down, Leg Cramps, Palpitations, Rapid Heart Rate, Shortness of Breath and Swelling of Extremities. Gastrointestinal Present- Abdominal Pain. Not Present- Bloating, Bloody Stool, Change in Bowel Habits, Chronic diarrhea, Constipation, Difficulty Swallowing, Excessive gas, Gets full quickly at meals, Hemorrhoids, Indigestion, Nausea, Rectal Pain and Vomiting. Musculoskeletal Present- Joint Pain. Not Present- Back Pain, Joint Stiffness, Muscle Pain, Muscle Weakness and Swelling of Extremities. Neurological Not Present- Decreased Memory, Fainting, Headaches, Numbness, Seizures, Tingling, Tremor, Trouble walking and Weakness. Psychiatric Not Present- Anxiety, Bipolar, Change in Sleep Pattern, Depression, Fearful and Frequent crying. Endocrine Not Present- Cold Intolerance, Excessive Hunger, Hair Changes, Heat Intolerance, Hot flashes and New Diabetes. Hematology Not Present- Easy Bruising, Excessive bleeding, Gland problems, HIV and Persistent Infections.   Vitals Ivor Costa CMA; 08/09/2015 1:22 PM) 08/09/2015 1:21 PM Weight: 202.8 lb Height: 73in Body Surface Area: 2.18 m Body Mass Index: 26.76 kg/m Temp.: 97.104F(Temporal)  Pulse: 80 (Regular)  Resp.: 16 (Unlabored)  BP: 122/74 (Sitting, Left Arm,  Standard)    Physical Exam Ralene Ok MD; 08/09/2015 1:33 PM) General Mental Status-Alert. General Appearance-Consistent with stated age. Hydration-Well hydrated. Voice-Normal.  Head and Neck Head-normocephalic, atraumatic with no lesions or palpable masses. Trachea-midline. Thyroid Gland Characteristics - normal size and consistency.  Chest and Lung Exam Chest and  lung exam reveals -quiet, even and easy respiratory effort with no use of accessory muscles and on auscultation, normal breath sounds, no adventitious sounds and normal vocal resonance. Inspection Chest Wall - Normal. Back - normal.  Cardiovascular Cardiovascular examination reveals -normal heart sounds, regular rate and rhythm with no murmurs and normal pedal pulses bilaterally.  Abdomen Inspection Skin - Scar - no surgical scars. Hernias - Inguinal hernia - Right - Reducible. Palpation/Percussion Normal exam - Soft, Non Tender, No Rebound tenderness, No Rigidity (guarding) and No hepatosplenomegaly. Auscultation Normal exam - Bowel sounds normal.    Assessment & Plan Ralene Ok MD; 08/09/2015 1:35 PM) RIGHT INGUINAL HERNIA (K40.90) Impression: 74 year old male with a right inguinal hernia.  1. The patient will like to proceed to the operating room for laparoscopic right inguinal hernia repair.  2. I discussed with the patient the signs and symptoms of incarceration and strangulation and the need to proceed to the ER should they occur.  3. I discussed with the patient the risks and benefits of the procedure to include but not limited to: Infection, bleeding, damage to surrounding structures, possible need for further surgery, possible nerve pain, and possible recurrence. The patient was understanding and wishes to proceed.

## 2015-08-14 ENCOUNTER — Encounter: Payer: Self-pay | Admitting: Internal Medicine

## 2015-08-22 ENCOUNTER — Encounter (HOSPITAL_COMMUNITY)
Admission: RE | Admit: 2015-08-22 | Discharge: 2015-08-22 | Disposition: A | Discharge status: Home / Self Care | Payer: Commercial Managed Care - HMO | Source: Ambulatory Visit | Attending: General Surgery | Admitting: General Surgery

## 2015-08-22 ENCOUNTER — Encounter (HOSPITAL_COMMUNITY): Payer: Self-pay

## 2015-08-22 DIAGNOSIS — E119 Type 2 diabetes mellitus without complications: Secondary | ICD-10-CM | POA: Diagnosis not present

## 2015-08-22 DIAGNOSIS — K4090 Unilateral inguinal hernia, without obstruction or gangrene, not specified as recurrent: Secondary | ICD-10-CM | POA: Diagnosis not present

## 2015-08-22 DIAGNOSIS — Z87891 Personal history of nicotine dependence: Secondary | ICD-10-CM | POA: Diagnosis not present

## 2015-08-22 LAB — BASIC METABOLIC PANEL
ANION GAP: 9 (ref 5–15)
BUN: 13 mg/dL (ref 6–20)
CALCIUM: 9.2 mg/dL (ref 8.9–10.3)
CO2: 23 mmol/L (ref 22–32)
Chloride: 108 mmol/L (ref 101–111)
Creatinine, Ser: 0.9 mg/dL (ref 0.61–1.24)
GFR calc Af Amer: >60 mL/min (ref >60)
GFR calc non Af Amer: >60 mL/min (ref >60)
GLUCOSE: 94 mg/dL (ref 65–99)
Potassium: 3.9 mmol/L (ref 3.5–5.1)
Sodium: 140 mmol/L (ref 135–145)

## 2015-08-22 LAB — CBC
HEMATOCRIT: 39.2 % (ref 39.0–52.0)
Hemoglobin: 13.1 g/dL (ref 13.0–17.0)
MCH: 29.2 pg (ref 26.0–34.0)
MCHC: 33.4 g/dL (ref 30.0–36.0)
MCV: 87.3 fL (ref 78.0–100.0)
Platelets: 147 10*3/uL — ABNORMAL LOW (ref 150–400)
RBC: 4.49 MIL/uL (ref 4.22–5.81)
RDW: 13.5 % (ref 11.5–15.5)
WBC: 6.2 10*3/uL (ref 4.0–10.5)

## 2015-08-22 LAB — GLUCOSE, CAPILLARY: Glucose-Capillary: 107 mg/dL — ABNORMAL HIGH (ref 65–99)

## 2015-08-22 NOTE — Progress Notes (Signed)
Frank Carlson, [place] notified about patients prior EKG in EPIC on 06/15/15

## 2015-08-22 NOTE — Pre-Procedure Instructions (Signed)
Frank Carlson  08/22/2015     Your procedure is scheduled on : Tuesday August 23, 2015 at 12:15 PM.  Report to [facility] Admitting at 10:15 AM.  Call this number if you have problems the morning of surgery: [phone]    Remember:  Do not eat food or drink liquids after midnight.  Take these medicines the morning of surgery with A SIP OF WATER : NONE   Stop taking any vitamins, herbal mediations, Fish oil, Ibuprofen, Advil, Motrin, etc    Do NOT take any diabetic pills the morning of your surgery (NO Metformin/Glucophage)   How to Manage Your Diabetes Before Surgery   Why is it important to control my blood sugar before and after surgery?   Improving blood sugar levels before and after surgery helps healing and can limit problems.  A way of improving blood sugar control is eating a healthy diet by:  - Eating less sugar and carbohydrates  - Increasing activity/exercise  - Talk with your doctor about reaching your blood sugar goals  High blood sugars (greater than 180 mg/dL) can raise your risk of infections and slow down your recovery so you will need to focus on controlling your diabetes during the weeks before surgery.  Make sure that the doctor who takes care of your diabetes knows about your planned surgery including the date and location.  How do I manage my blood sugars before surgery?   Check your blood sugar at least 4 times a day, 2 days before surgery to make sure that they are not too high or low.   Check your blood sugar the morning of your surgery when you wake up and every 2 hours until you get to the Short-Stay unit.  If your blood sugar is less than 70 mg/dL, you will need to treat for low blood sugar by:  Treat a low blood sugar (less than 70 mg/dL) with 1/2 cup of clear juice (cranberry or apple), 4 glucose tablets, OR glucose gel.  Recheck blood sugar in 15 minutes after treatment (to make sure it is greater than 70 mg/dL).  If blood  sugar is not greater than 70 mg/dL on re-check, call [phone] for further instructions.   Report your blood sugar to the Short-Stay nurse when you get to Short-Stay.  References:  [facility], 2007 "How to Manage your Diabetes Before and After Surgery".  What do I do about my diabetes medications?   Do not take oral diabetes medicines (pills) the morning of surgery.   Do not wear jewelry, make-up or nail polish.  Do not wear lotions, powders, or perfumes.  You may wear deodorant.  Do not shave 48 hours prior to surgery.  Men may shave face and neck.  Do not bring valuables to the hospital.  [facility] is not responsible for any belongings or valuables.  Contacts, dentures or bridgework may not be worn into surgery.  Leave your suitcase in the car.  After surgery it may be brought to your room.  For patients admitted to the hospital, discharge time will be determined by your treatment team.  Patients discharged the day of surgery will not be allowed to drive home.   Name and phone number of your driver:    Special instructions:  Shower using CHG soap the night before and the morning of your surgery  Please read over the following fact sheets that you were given. Pain Booklet, Coughing and Deep Breathing and  Surgical Site Infection Prevention

## 2015-08-22 NOTE — Progress Notes (Signed)
PCP is Viviana Simpler  Patient denied having any cardiac or pulmonary issues  Nurse inquired about blood glucose levels and patient informed Nurse that the highest his blood glucose has been was 105, and the lowest was 88. CBG on arrival to PAT was 107. Patient stated he had consumed some food today

## 2015-08-23 ENCOUNTER — Encounter (HOSPITAL_COMMUNITY)
Admission: RE | Disposition: A | Discharge status: Home / Self Care | Payer: Self-pay | Source: Ambulatory Visit | Attending: General Surgery

## 2015-08-23 ENCOUNTER — Encounter (HOSPITAL_COMMUNITY): Payer: Self-pay | Admitting: *Deleted

## 2015-08-23 ENCOUNTER — Ambulatory Visit (HOSPITAL_COMMUNITY)
Admission: RE | Admit: 2015-08-23 | Discharge: 2015-08-23 | Disposition: A | Discharge status: Home / Self Care | Payer: Commercial Managed Care - HMO | Source: Ambulatory Visit | Attending: General Surgery | Admitting: General Surgery

## 2015-08-23 ENCOUNTER — Ambulatory Visit (HOSPITAL_COMMUNITY): Payer: Commercial Managed Care - HMO | Admitting: Vascular Surgery

## 2015-08-23 ENCOUNTER — Ambulatory Visit (HOSPITAL_COMMUNITY): Payer: Commercial Managed Care - HMO | Admitting: Anesthesiology

## 2015-08-23 DIAGNOSIS — Z87891 Personal history of nicotine dependence: Secondary | ICD-10-CM | POA: Diagnosis not present

## 2015-08-23 DIAGNOSIS — K4090 Unilateral inguinal hernia, without obstruction or gangrene, not specified as recurrent: Secondary | ICD-10-CM | POA: Diagnosis not present

## 2015-08-23 DIAGNOSIS — E119 Type 2 diabetes mellitus without complications: Secondary | ICD-10-CM | POA: Diagnosis not present

## 2015-08-23 DIAGNOSIS — G8918 Other acute postprocedural pain: Secondary | ICD-10-CM | POA: Diagnosis not present

## 2015-08-23 HISTORY — PX: INSERTION OF MESH: SHX5868

## 2015-08-23 HISTORY — PX: INGUINAL HERNIA REPAIR: SHX194

## 2015-08-23 LAB — GLUCOSE, CAPILLARY
GLUCOSE-CAPILLARY: 105 mg/dL — AB (ref 65–99)
GLUCOSE-CAPILLARY: 130 mg/dL — AB (ref 65–99)

## 2015-08-23 LAB — HEMOGLOBIN A1C
Hgb A1c MFr Bld: 6.7 % — ABNORMAL HIGH (ref 4.8–5.6)
Mean Plasma Glucose: 146 mg/dL

## 2015-08-23 SURGERY — REPAIR, HERNIA, INGUINAL, LAPAROSCOPIC
Anesthesia: General | Laterality: Right

## 2015-08-23 MED ORDER — DEXAMETHASONE SODIUM PHOSPHATE 4 MG/ML IJ SOLN
INTRAMUSCULAR | Status: AC
  Filled 2015-08-23: qty 2

## 2015-08-23 MED ORDER — BUPIVACAINE-EPINEPHRINE (PF) 0.5% -1:200000 IJ SOLN
INTRAMUSCULAR | Status: DC | PRN
  Administered 2015-08-23: 30 mL via PERINEURAL

## 2015-08-23 MED ORDER — ROCURONIUM BROMIDE 50 MG/5ML IV SOLN
INTRAVENOUS | Status: AC
  Filled 2015-08-23: qty 1

## 2015-08-23 MED ORDER — CEFAZOLIN SODIUM-DEXTROSE 2-3 GM-% IV SOLR
INTRAVENOUS | Status: AC
  Filled 2015-08-23: qty 50

## 2015-08-23 MED ORDER — FENTANYL CITRATE (PF) 100 MCG/2ML IJ SOLN
INTRAMUSCULAR | Status: AC
  Administered 2015-08-23: 50 ug via INTRAVENOUS
  Filled 2015-08-23: qty 2

## 2015-08-23 MED ORDER — ONDANSETRON HCL 4 MG/2ML IJ SOLN
INTRAMUSCULAR | Status: DC | PRN
  Administered 2015-08-23: 4 mg via INTRAVENOUS

## 2015-08-23 MED ORDER — CHLORHEXIDINE GLUCONATE 4 % EX LIQD: 1.0000 "application " | Freq: Once | CUTANEOUS | Status: DC

## 2015-08-23 MED ORDER — GLYCOPYRROLATE 0.2 MG/ML IJ SOLN
INTRAMUSCULAR | Status: AC
  Filled 2015-08-23: qty 2

## 2015-08-23 MED ORDER — BUPIVACAINE-EPINEPHRINE (PF) 0.25% -1:200000 IJ SOLN
INTRAMUSCULAR | Status: AC
  Filled 2015-08-23: qty 30

## 2015-08-23 MED ORDER — PROPOFOL 10 MG/ML IV BOLUS
INTRAVENOUS | Status: DC | PRN
  Administered 2015-08-23: 50 mg via INTRAVENOUS
  Administered 2015-08-23: 100 mg via INTRAVENOUS

## 2015-08-23 MED ORDER — NEOSTIGMINE METHYLSULFATE 10 MG/10ML IV SOLN
INTRAVENOUS | Status: AC
  Filled 2015-08-23: qty 4

## 2015-08-23 MED ORDER — FENTANYL CITRATE (PF) 100 MCG/2ML IJ SOLN
50.0000 ug | Freq: Once | INTRAMUSCULAR | Status: AC
  Administered 2015-08-23: 50 ug via INTRAVENOUS

## 2015-08-23 MED ORDER — FENTANYL CITRATE (PF) 100 MCG/2ML IJ SOLN
INTRAMUSCULAR | Status: DC | PRN
  Administered 2015-08-23 (×3): 50 ug via INTRAVENOUS

## 2015-08-23 MED ORDER — ONDANSETRON HCL 4 MG/2ML IJ SOLN
INTRAMUSCULAR | Status: AC
  Filled 2015-08-23: qty 2

## 2015-08-23 MED ORDER — OXYCODONE-ACETAMINOPHEN 5-325 MG PO TABS: 1.0000 | ORAL_TABLET | ORAL | Status: DC | PRN

## 2015-08-23 MED ORDER — GLYCOPYRROLATE 0.2 MG/ML IJ SOLN
INTRAMUSCULAR | Status: DC | PRN
  Administered 2015-08-23: .4 mg via INTRAVENOUS
  Administered 2015-08-23: 0.2 mg via INTRAVENOUS

## 2015-08-23 MED ORDER — HYDROMORPHONE HCL 1 MG/ML IJ SOLN
INTRAMUSCULAR | Status: AC
  Filled 2015-08-23: qty 1

## 2015-08-23 MED ORDER — ROCURONIUM BROMIDE 100 MG/10ML IV SOLN
INTRAVENOUS | Status: DC | PRN
  Administered 2015-08-23: 40 mg via INTRAVENOUS

## 2015-08-23 MED ORDER — MIDAZOLAM HCL 2 MG/2ML IJ SOLN
INTRAMUSCULAR | Status: AC
  Filled 2015-08-23: qty 4

## 2015-08-23 MED ORDER — LIDOCAINE HCL (CARDIAC) 20 MG/ML IV SOLN
INTRAVENOUS | Status: AC
  Filled 2015-08-23: qty 5

## 2015-08-23 MED ORDER — NEOSTIGMINE METHYLSULFATE 10 MG/10ML IV SOLN
INTRAVENOUS | Status: DC | PRN
  Administered 2015-08-23: 3 mg via INTRAVENOUS

## 2015-08-23 MED ORDER — FENTANYL CITRATE (PF) 100 MCG/2ML IJ SOLN
25.0000 ug | INTRAMUSCULAR | Status: DC | PRN
  Administered 2015-08-23: 50 ug via INTRAVENOUS
  Administered 2015-08-23: 25 ug via INTRAVENOUS

## 2015-08-23 MED ORDER — LACTATED RINGERS IV SOLN
INTRAVENOUS | Status: DC
  Administered 2015-08-23 (×3): via INTRAVENOUS

## 2015-08-23 MED ORDER — FENTANYL CITRATE (PF) 100 MCG/2ML IJ SOLN
INTRAMUSCULAR | Status: AC
  Filled 2015-08-23: qty 2

## 2015-08-23 MED ORDER — LIDOCAINE HCL (CARDIAC) 20 MG/ML IV SOLN
INTRAVENOUS | Status: DC | PRN
  Administered 2015-08-23: 100 mg via INTRAVENOUS

## 2015-08-23 MED ORDER — CEFAZOLIN SODIUM-DEXTROSE 2-3 GM-% IV SOLR
INTRAVENOUS | Status: AC
  Administered 2015-08-23: 2 g via INTRAVENOUS
  Filled 2015-08-23: qty 50

## 2015-08-23 MED ORDER — PROPOFOL 10 MG/ML IV BOLUS
INTRAVENOUS | Status: AC
Start: 2015-08-23 — End: 2015-08-23
  Filled 2015-08-23: qty 20

## 2015-08-23 MED ORDER — EPHEDRINE SULFATE 50 MG/ML IJ SOLN
INTRAMUSCULAR | Status: DC | PRN
  Administered 2015-08-23 (×2): 10 mg via INTRAVENOUS

## 2015-08-23 MED ORDER — MIDAZOLAM HCL 5 MG/5ML IJ SOLN
INTRAMUSCULAR | Status: DC | PRN
  Administered 2015-08-23: 2 mg via INTRAVENOUS

## 2015-08-23 MED ORDER — BUPIVACAINE-EPINEPHRINE (PF) 0.25% -1:200000 IJ SOLN
INTRAMUSCULAR | Status: DC | PRN
  Administered 2015-08-23: 30 mL
  Administered 2015-08-23: 6 mL

## 2015-08-23 MED ORDER — FENTANYL CITRATE (PF) 250 MCG/5ML IJ SOLN
INTRAMUSCULAR | Status: AC
  Filled 2015-08-23: qty 5

## 2015-08-23 MED ORDER — SODIUM CHLORIDE 0.9 % IV SOLN: INTRAVENOUS | Status: DC

## 2015-08-23 MED ORDER — 0.9 % SODIUM CHLORIDE (POUR BTL) OPTIME
TOPICAL | Status: DC | PRN
  Administered 2015-08-23: 1000 mL

## 2015-08-23 MED ORDER — ONDANSETRON HCL 4 MG/2ML IJ SOLN: 4.0000 mg | Freq: Once | INTRAMUSCULAR | Status: DC | PRN

## 2015-08-23 MED ORDER — CEFAZOLIN SODIUM-DEXTROSE 2-3 GM-% IV SOLR: 2.0000 g | INTRAVENOUS | Status: DC

## 2015-08-23 MED ORDER — MIDAZOLAM HCL 2 MG/2ML IJ SOLN
INTRAMUSCULAR | Status: AC
  Filled 2015-08-23: qty 2

## 2015-08-23 SURGICAL SUPPLY — 51 items
APL SKNCLS STERI-STRIP NONHPOA (GAUZE/BANDAGES/DRESSINGS) ×1
APPLIER CLIP 5 13 M/L LIGAMAX5 (MISCELLANEOUS)
APR CLP MED LRG 5 ANG JAW (MISCELLANEOUS)
BENZOIN TINCTURE PRP APPL 2/3 (GAUZE/BANDAGES/DRESSINGS) ×3 IMPLANT
CANISTER SUCTION 2500CC (MISCELLANEOUS) IMPLANT
CHLORAPREP W/TINT 26ML (MISCELLANEOUS) ×3 IMPLANT
CLIP APPLIE 5 13 M/L LIGAMAX5 (MISCELLANEOUS) IMPLANT
CLOSURE WOUND 1/2 X4 (GAUZE/BANDAGES/DRESSINGS) ×1
COVER SURGICAL LIGHT HANDLE (MISCELLANEOUS) ×3 IMPLANT
DISSECTOR BLUNT TIP ENDO 5MM (MISCELLANEOUS) IMPLANT
ELECT REM PT RETURN 9FT ADLT (ELECTROSURGICAL) ×3
ELECTRODE REM PT RTRN 9FT ADLT (ELECTROSURGICAL) ×1 IMPLANT
GAUZE SPONGE 2X2 8PLY STRL LF (GAUZE/BANDAGES/DRESSINGS) ×1 IMPLANT
GLOVE BIO SURGEON STRL SZ7 (GLOVE) ×2 IMPLANT
GLOVE BIO SURGEON STRL SZ7.5 (GLOVE) ×3 IMPLANT
GLOVE BIOGEL PI IND STRL 6.5 (GLOVE) IMPLANT
GLOVE BIOGEL PI IND STRL 7.0 (GLOVE) IMPLANT
GLOVE BIOGEL PI INDICATOR 6.5 (GLOVE) ×6
GLOVE BIOGEL PI INDICATOR 7.0 (GLOVE) ×2
GLOVE SURG SS PI 6.5 STRL IVOR (GLOVE) ×2 IMPLANT
GOWN STRL REUS W/ TWL LRG LVL3 (GOWN DISPOSABLE) ×2 IMPLANT
GOWN STRL REUS W/ TWL XL LVL3 (GOWN DISPOSABLE) ×1 IMPLANT
GOWN STRL REUS W/TWL LRG LVL3 (GOWN DISPOSABLE) ×6
GOWN STRL REUS W/TWL XL LVL3 (GOWN DISPOSABLE) ×3
KIT BASIN OR (CUSTOM PROCEDURE TRAY) ×3 IMPLANT
KIT ROOM TURNOVER OR (KITS) ×3 IMPLANT
MESH 3DMAX 5X7 RT XLRG (Mesh General) ×2 IMPLANT
NDL INSUFFLATION 14GA 120MM (NEEDLE) IMPLANT
NEEDLE INSUFFLATION 14GA 120MM (NEEDLE) ×3 IMPLANT
NS IRRIG 1000ML POUR BTL (IV SOLUTION) ×3 IMPLANT
PAD ARMBOARD 7.5X6 YLW CONV (MISCELLANEOUS) ×6 IMPLANT
RELOAD STAPLE 4.0 BLU F/HERNIA (INSTRUMENTS) ×1 IMPLANT
RELOAD STAPLE 4.8 BLK F/HERNIA (STAPLE) IMPLANT
RELOAD STAPLE HERNIA 4.0 BLUE (INSTRUMENTS) ×3 IMPLANT
RELOAD STAPLE HERNIA 4.8 BLK (STAPLE) ×3 IMPLANT
SCISSORS LAP 5X35 DISP (ENDOMECHANICALS) ×3 IMPLANT
SET IRRIG TUBING LAPAROSCOPIC (IRRIGATION / IRRIGATOR) IMPLANT
SET TROCAR LAP APPLE-HUNT 5MM (ENDOMECHANICALS) ×3 IMPLANT
SPONGE GAUZE 2X2 STER 10/PKG (GAUZE/BANDAGES/DRESSINGS) ×2
STAPLER HERNIA 12 8.5 360D (INSTRUMENTS) ×3 IMPLANT
STRIP CLOSURE SKIN 1/2X4 (GAUZE/BANDAGES/DRESSINGS) ×2 IMPLANT
SUT MNCRL AB 4-0 PS2 18 (SUTURE) ×3 IMPLANT
SUT VIC AB 1 CT1 27 (SUTURE) ×3
SUT VIC AB 1 CT1 27XBRD ANBCTR (SUTURE) ×1 IMPLANT
TAPE CLOTH SURG 4X10 WHT LF (GAUZE/BANDAGES/DRESSINGS) ×2 IMPLANT
TOWEL OR 17X24 6PK STRL BLUE (TOWEL DISPOSABLE) ×3 IMPLANT
TOWEL OR 17X26 10 PK STRL BLUE (TOWEL DISPOSABLE) ×3 IMPLANT
TRAY FOLEY CATH 16FR SILVER (SET/KITS/TRAYS/PACK) ×3 IMPLANT
TRAY LAPAROSCOPIC MC (CUSTOM PROCEDURE TRAY) ×3 IMPLANT
TROCAR XCEL 12X100 BLDLESS (ENDOMECHANICALS) ×3 IMPLANT
TUBING INSUFFLATION (TUBING) ×3 IMPLANT

## 2015-08-23 NOTE — Progress Notes (Signed)
Pt returned to [facility]. Unable to void at this time. Spoke w/ Thayer Headings in short stay who is to call back with bed assignment.

## 2015-08-23 NOTE — Op Note (Signed)
08/23/2015  1:01 PM  PATIENT:  Frank Carlson  74 y.o. male  PRE-OPERATIVE DIAGNOSIS:  RIGHT INGUINAL HERNIA  POST-OPERATIVE DIAGNOSIS:  RIGHT INDIRECT INGUINAL HERNIA  PROCEDURE:  Procedure(s): LAPAROSCOPIC RIGHT INGUINAL HERNIA REPAIR WITH MESH (Right) INSERTION OF MESH (Right)  SURGEON:  Surgeon(s) and Role:    * Ralene Ok, MD - Primary  ASSISTANTS: none   ANESTHESIA:   local and general  EBL:  Total I/O In: 1000 [I.V.:1000] Out: -   BLOOD ADMINISTERED:none  DRAINS: none   LOCAL MEDICATIONS USED:  BUPIVICAINE   SPECIMEN:  No Specimen  DISPOSITION OF SPECIMEN:  N/A  COUNTS:  YES  TOURNIQUET:  * No tourniquets in log *  DICTATION: .Dragon Dictation   Counts: reported as correct x 2  Findings:  The patient had a large right indirect hernia  Indications for procedure:  The patient is a 74 year old male with a right inguinal hernia for several months. Patient complained of symptomatology to his right inguinal area. The patient was taken back for elective inguinal hernia repair.  Details of the procedure: The patient was taken back to the operating room. The patient was placed in supine position with bilateral SCDs in place.  The patient was prepped and draped in the usual sterile fashion.  After appropriate anitbiotics were confirmed, a time-out was confirmed and all facts were verified.  0.25% Marcaine was used to infiltrate the umbilical area. A 11-blade was used to cut down the skin and blunt dissection was used to get the anterior fashion.  The anterior fascia was incised approximately 1 cm and the muscles were retracted laterally. Blunt dissection was then used to create a space in the preperitoneal area. At this time a 10 mm camera was then introduced into the space and advanced the pubic tubercle and a 12 mm trocar was placed over this and insufflation was started.  At this time and space was created from medial to laterally the preperitoneal space.   Cooper's ligament was initially cleaned off.  The large hernia sac was identified in the indirect space. Dissection of the hernia sac was undertaken the vas deferens was identified and protected in all parts of the case.    Once the hernia sac was taken down to approximately the umbilicus a Bard 3D Max mesh, size: X-Large, was  introduced into the preperitoneal space.  The mesh was brought over to cover the direct and indirect hernia spaces.  This was anchored into place and secured to Cooper's ligament with 4.15mm staples from a [place] hernia stapler. It was anchored to the anterior abdominal wall with 4.8 mm staples. The hernia sac was seen lying posterior to the mesh. There was no staples placed laterally. The insufflation was evacuated and the peritoneum was seen posterior to the mesh. The trochars were removed. The anterior fascia was reapproximated using #1 Vicryl on a UR- 6.  Intra-abdominal air was evacuated and the Veress needle removed. The skin was reapproximated using 4-0 Monocryl subcuticular fashion the patient was awakened from general anesthesia and taken to recovery in stable condition.   PLAN OF CARE: Discharge to home after PACU  PATIENT DISPOSITION:  PACU - hemodynamically stable.   Delay start of Pharmacological VTE agent (>24hrs) due to surgical blood loss or risk of bleeding: not applicable

## 2015-08-23 NOTE — Interval H&P Note (Signed)
History and Physical Interval Note:  08/23/2015 7:28 AM  Frank Carlson  has presented today for surgery, with the diagnosis of RIGHT INGUINAL HERNIA  The various methods of treatment have been discussed with the patient and family. After consideration of risks, benefits and other options for treatment, the patient has consented to  Procedure(s): LAPAROSCOPIC RIGHT INGUINAL HERNIA REPAIR WITH MESH (Right) INSERTION OF MESH (Right) as a surgical intervention .  The patient's history has been reviewed, patient examined, no change in status, stable for surgery.  I have reviewed the patient's chart and labs.  Questions were answered to the patient's satisfaction.     Rosario Jacks., Anne Hahn

## 2015-08-23 NOTE — H&P (View-Only) (Signed)
History of Present Illness Ralene Ok MD; 08/09/2015 1:34 PM) Patient words: Evaluate inguinal hernia.  The patient is a 74 year old male who presents with an inguinal hernia. Patient is a 74 year old male who is referred by Dr. Delfino Lovett left back for an evaluation of a right inguinal hernia. The patient states that the hernia is been there for approximately 2 weeks. The patient is very active and walks for approximately 15 minutes at a gym as well as works out for approximately 30 minutes thereafter. The patient is very active. He states that the hernia is interfering with his workout routine. The patient states he is able to manually reduce the hernia. The patient has had no signs or symptoms of incarceration or strangulation.   Other Problems Ivor Costa, [place]; 08/09/2015 1:22 PM) Diabetes Mellitus Hypercholesterolemia Inguinal Hernia  Past Surgical History Ivor Costa, [place]; 08/09/2015 1:22 PM) Cataract Surgery Bilateral. Colon Polyp Removal - Colonoscopy Tonsillectomy  Diagnostic Studies History Ivor Costa, CMA; 08/09/2015 1:22 PM) Colonoscopy 1-5 years ago  Allergies Ivor Costa, [place]; 08/09/2015 1:22 PM) No Known Drug Allergies09/27/2016  Medication History Ivor Costa, CMA; 08/09/2015 1:22 PM) Pleas Patricia Contour Next Test (In Vitro) Active. Software engineer. EpiPen 2-Pak (0.3MG /0.3ML Soln Auto-inj, Injection) Active. PredniSONE (20MG  Tablet, Oral) Active. Medications Reconciled  Social History Ivor Costa, [place]; 08/09/2015 1:22 PM) Alcohol use Remotely quit alcohol use. Caffeine use Carbonated beverages. No drug use Tobacco use Former smoker.  Family History Ivor Costa, [place]; 08/09/2015 1:22 PM) Alcohol Abuse Mother, Sister. Diabetes Mellitus Brother. Heart Disease Father. Heart disease in male family member before age 5 Thyroid problems Brother.  Review of Systems Ivor Costa CMA; 08/09/2015 1:22 PM) General  Not Present- Appetite Loss, Chills, Fatigue, Fever, Night Sweats, Weight Gain and Weight Loss. Skin Not Present- Change in Wart/Mole, Dryness, Hives, Jaundice, New Lesions, Non-Healing Wounds, Rash and Ulcer. HEENT Present- Hearing Loss and Wears glasses/contact lenses. Not Present- Earache, Hoarseness, Nose Bleed, Oral Ulcers, Ringing in the Ears, Seasonal Allergies, Sinus Pain, Sore Throat, Visual Disturbances and Yellow Eyes. Respiratory Not Present- Bloody sputum, Chronic Cough, Difficulty Breathing, Snoring and Wheezing. Breast Not Present- Breast Mass, Breast Pain, Nipple Discharge and Skin Changes. Cardiovascular Not Present- Chest Pain, Difficulty Breathing Lying Down, Leg Cramps, Palpitations, Rapid Heart Rate, Shortness of Breath and Swelling of Extremities. Gastrointestinal Present- Abdominal Pain. Not Present- Bloating, Bloody Stool, Change in Bowel Habits, Chronic diarrhea, Constipation, Difficulty Swallowing, Excessive gas, Gets full quickly at meals, Hemorrhoids, Indigestion, Nausea, Rectal Pain and Vomiting. Musculoskeletal Present- Joint Pain. Not Present- Back Pain, Joint Stiffness, Muscle Pain, Muscle Weakness and Swelling of Extremities. Neurological Not Present- Decreased Memory, Fainting, Headaches, Numbness, Seizures, Tingling, Tremor, Trouble walking and Weakness. Psychiatric Not Present- Anxiety, Bipolar, Change in Sleep Pattern, Depression, Fearful and Frequent crying. Endocrine Not Present- Cold Intolerance, Excessive Hunger, Hair Changes, Heat Intolerance, Hot flashes and New Diabetes. Hematology Not Present- Easy Bruising, Excessive bleeding, Gland problems, HIV and Persistent Infections.   Vitals Ivor Costa CMA; 08/09/2015 1:22 PM) 08/09/2015 1:21 PM Weight: 202.8 lb Height: 73in Body Surface Area: 2.18 m Body Mass Index: 26.76 kg/m Temp.: 97.61F(Temporal)  Pulse: 80 (Regular)  Resp.: 16 (Unlabored)  BP: 122/74 (Sitting, Left Arm,  Standard)    Physical Exam Ralene Ok MD; 08/09/2015 1:33 PM) General Mental Status-Alert. General Appearance-Consistent with stated age. Hydration-Well hydrated. Voice-Normal.  Head and Neck Head-normocephalic, atraumatic with no lesions or palpable masses. Trachea-midline. Thyroid Gland Characteristics - normal size and consistency.  Chest and Lung Exam Chest and  lung exam reveals -quiet, even and easy respiratory effort with no use of accessory muscles and on auscultation, normal breath sounds, no adventitious sounds and normal vocal resonance. Inspection Chest Wall - Normal. Back - normal.  Cardiovascular Cardiovascular examination reveals -normal heart sounds, regular rate and rhythm with no murmurs and normal pedal pulses bilaterally.  Abdomen Inspection Skin - Scar - no surgical scars. Hernias - Inguinal hernia - Right - Reducible. Palpation/Percussion Normal exam - Soft, Non Tender, No Rebound tenderness, No Rigidity (guarding) and No hepatosplenomegaly. Auscultation Normal exam - Bowel sounds normal.    Assessment & Plan Ralene Ok MD; 08/09/2015 1:35 PM) RIGHT INGUINAL HERNIA (K40.90) Impression: 74 year old male with a right inguinal hernia.  1. The patient will like to proceed to the operating room for laparoscopic right inguinal hernia repair.  2. I discussed with the patient the signs and symptoms of incarceration and strangulation and the need to proceed to the ER should they occur.  3. I discussed with the patient the risks and benefits of the procedure to include but not limited to: Infection, bleeding, damage to surrounding structures, possible need for further surgery, possible nerve pain, and possible recurrence. The patient was understanding and wishes to proceed.

## 2015-08-23 NOTE — Anesthesia Postprocedure Evaluation (Signed)
  Anesthesia Post-op Note  Patient: Frank Carlson  Procedure(s) Performed: Procedure(s) (LRB): LAPAROSCOPIC RIGHT INGUINAL HERNIA REPAIR WITH MESH (Right) INSERTION OF MESH (Right)  Patient Location: PACU  Anesthesia Type: GA combined with regional for post-op pain  Level of Consciousness: awake and alert   Airway and Oxygen Therapy: Patient Spontanous Breathing  Post-op Pain: mild  Post-op Assessment: Post-op Vital signs reviewed, Patient's Cardiovascular Status Stable, Respiratory Function Stable, Patent Airway and No signs of Nausea or vomiting  Last Vitals:  Filed Vitals:   08/23/15 1333  BP: 116/58  Pulse: 47  Temp:   Resp: 13    Post-op Vital Signs: stable   Complications: No apparent anesthesia complications

## 2015-08-23 NOTE — Anesthesia Procedure Notes (Addendum)
Anesthesia Regional Block:  TAP block  Pre-Anesthetic Checklist: ,, timeout performed, Correct Patient, Correct Site, Correct Laterality, Correct Procedure, Correct Position, site marked, Risks and benefits discussed,  Surgical consent,  Pre-op evaluation,  At surgeon's request and post-op pain management  Laterality: Right  Prep: chloraprep       Needles:  Injection technique: Single-shot  Needle Type: Echogenic Stimulator Needle     Needle Length: 10cm 10 cm Needle Gauge: 21 and 21 G    Additional Needles:  Procedures: ultrasound guided (picture in chart) TAP block Narrative:  Injection made incrementally with aspirations every 5 mL.  Performed by: Personally  Anesthesiologist: Catalina Gravel  Additional Notes: Good visualization of muscle planes under [place].  Good needle visualization. Local anesthetic spread easily. Negative aspiration. Patient tolerated procedure well.

## 2015-08-23 NOTE — Anesthesia Preprocedure Evaluation (Addendum)
Anesthesia Evaluation  Patient identified by MRN, date of birth, ID band Patient awake    Reviewed: Allergy & Precautions, NPO status , Patient's Chart, lab work & pertinent test results  Airway Mallampati: II  TM Distance: >3 FB Neck ROM: Full    Dental  (+) Teeth Intact, Dental Advisory Given,    Pulmonary former smoker,    Pulmonary exam normal breath sounds clear to auscultation       Cardiovascular Exercise Tolerance: Good (-) hypertensionnegative cardio ROS Normal cardiovascular exam Rhythm:Regular Rate:Normal     Neuro/Psych negative neurological ROS  negative psych ROS   GI/Hepatic negative GI ROS, Neg liver ROS,   Endo/Other  diabetes, Type 2, Oral Hypoglycemic Agents  Renal/GU negative Renal ROS     Musculoskeletal negative musculoskeletal ROS (+)   Abdominal   Peds  Hematology negative hematology ROS (+)   Anesthesia Other Findings Day of surgery medications reviewed with the patient.  Reproductive/Obstetrics                           Anesthesia Physical Anesthesia Plan  ASA: II  Anesthesia Plan: General   Post-op Pain Management: GA combined w/ Regional for post-op pain   Induction: Intravenous  Airway Management Planned: Oral ETT  Additional Equipment:   Intra-op Plan:   Post-operative Plan: Extubation in OR  Informed Consent: I have reviewed the patients History and Physical, chart, labs and discussed the procedure including the risks, benefits and alternatives for the proposed anesthesia with the patient or authorized representative who has indicated his/her understanding and acceptance.   Dental advisory given  Plan Discussed with: CRNA  Anesthesia Plan Comments: (Risks/benefits of general anesthesia discussed with patient including risk of damage to teeth, lips, gum, and tongue, nausea/vomiting, allergic reactions to medications, and the possibility of heart  attack, stroke and death.  All patient questions answered.  Patient wishes to proceed.  GA+TAP block.)       Anesthesia Quick Evaluation

## 2015-08-23 NOTE — Progress Notes (Signed)
Patient did not void on own. Rescanned bladded for 0. Dr. Rosendo Gros states to in and out cath if >300 keep it and go home if < 300 instruct patient he can go home but has to come back to the emergency room if unable to void in 8 hours. Output 250. Patient was educated about this and offered to stay but wants to go home. Patient was instructed to set an alarm for 2am if unable to void on own he has to come back to the [facility] ED. Patient verbalizes understanding and is ready to go home. No other complaints at this time

## 2015-08-23 NOTE — Discharge Instructions (Signed)
[facility] _______[facility]  UMBILICAL OR INGUINAL HERNIA REPAIR: POST OP INSTRUCTIONS  Always review your discharge instruction sheet given to you by the facility where your surgery was performed. IF YOU HAVE DISABILITY OR FAMILY LEAVE FORMS, YOU MUST BRING THEM TO THE OFFICE FOR PROCESSING.   DO NOT GIVE THEM TO YOUR DOCTOR.  1. A  prescription for pain medication may be given to you upon discharge.  Take your pain medication as prescribed, if needed.  If narcotic pain medicine is not needed, then you may take acetaminophen (Tylenol) or ibuprofen (Advil) as needed. 2. Take your usually prescribed medications unless otherwise directed. 3. If you need a refill on your pain medication, please contact your pharmacy.  They will contact our office to request authorization. Prescriptions will not be filled after 5 pm or on week-ends. 4. You should follow a light diet the first 24 hours after arrival home, such as soup and crackers, etc.  Be sure to include lots of fluids daily.  Resume your normal diet the day after surgery. 5. Most patients will experience some swelling and bruising around the umbilicus or in the groin and scrotum.  Ice packs and reclining will help.  Swelling and bruising can take several days to resolve.  6. It is common to experience some constipation if taking pain medication after surgery.  Increasing fluid intake and taking a stool softener (such as Colace) will usually help or prevent this problem from occurring.  A mild laxative (Milk of Magnesia or Miralax) should be taken according to package directions if there are no bowel movements after 48 hours. 7. Unless discharge instructions indicate otherwise, you may remove your bandages 24-48 hours after surgery, and you may shower at that time.  You may have steri-strips (small skin tapes) in place directly over the incision.  These strips should be left on the skin for 7-10 days.  If your surgeon used skin glue on the  incision, you may shower in 24 hours.  The glue will flake off over the next 2-3 weeks.  Any sutures or staples will be removed at the office during your follow-up visit. 8. ACTIVITIES:  You may resume regular (light) daily activities beginning the next day--such as daily self-care, walking, climbing stairs--gradually increasing activities as tolerated.  You may have sexual intercourse when it is comfortable.  Refrain from any heavy lifting or straining until approved by your doctor. a. You may drive when you are no longer taking prescription pain medication, you can comfortably wear a seatbelt, and you can safely maneuver your car and apply brakes. b. RETURN TO WORK:  __________________________________________________________ 9. You should see your doctor in the office for a follow-up appointment approximately 2-3 weeks after your surgery.  Make sure that you call for this appointment within a day or two after you arrive home to insure a convenient appointment time. 10. OTHER INSTRUCTIONS:  __________________________________________________________________________________________________________________________________________________________________________________________  WHEN TO CALL YOUR DOCTOR: 1. Fever over 101.0 2. Inability to urinate 3. Nausea and/or vomiting 4. Extreme swelling or bruising 5. Continued bleeding from incision. 6. Increased pain, redness, or drainage from the incision  The clinic staff is available to answer your questions during regular business hours.  Please don't hesitate to call and ask to speak to one of the nurses for clinical concerns.  If you have a medical emergency, go to the nearest emergency room or call 911.  A surgeon from [facility] is always on call at the hospital     [address], [address], [place], [place]  [address] ?  [address] [address], [place], [place]   [address] [phone] ? [phone] ? FAX [phone] Web site:  [email or web address]  

## 2015-08-23 NOTE — Progress Notes (Signed)
Pt going to bathroom to attempt urination.

## 2015-08-23 NOTE — Progress Notes (Signed)
Patient unable to void, bladder scan done, results 0. Patient states he has had 4 cups of water and 1 cup of soda. Dr. Rosendo Gros notified and orders received. No complaints from patient at this time. Will continue to monitor

## 2015-08-23 NOTE — Transfer of Care (Signed)
Immediate Anesthesia Transfer of Care Note  Patient: Frank Carlson  Procedure(s) Performed: Procedure(s): LAPAROSCOPIC RIGHT INGUINAL HERNIA REPAIR WITH MESH (Right) INSERTION OF MESH (Right)  Patient Location: PACU  Anesthesia Type:General  Level of Consciousness: awake, alert , oriented and patient cooperative  Airway & Oxygen Therapy: Patient Spontanous Breathing and Patient connected to nasal cannula oxygen  Post-op Assessment: Report given to RN and Post -op Vital signs reviewed and stable  Post vital signs: Reviewed and stable  Last Vitals:  Filed Vitals:   08/23/15 1121  BP:   Pulse: 51  Temp:   Resp: 13    Complications: No apparent anesthesia complications

## 2015-08-24 ENCOUNTER — Encounter (HOSPITAL_COMMUNITY): Payer: Self-pay | Admitting: General Surgery

## 2015-11-15 ENCOUNTER — Encounter: Payer: Self-pay | Admitting: Internal Medicine

## 2015-11-16 ENCOUNTER — Ambulatory Visit (INDEPENDENT_AMBULATORY_CARE_PROVIDER_SITE_OTHER)
Admission: RE | Admit: 2015-11-16 | Discharge: 2015-11-16 | Disposition: A | Discharge status: Home / Self Care | Payer: Commercial Managed Care - HMO | Source: Ambulatory Visit | Attending: Family Medicine | Admitting: Family Medicine

## 2015-11-16 ENCOUNTER — Ambulatory Visit (INDEPENDENT_AMBULATORY_CARE_PROVIDER_SITE_OTHER): Payer: Commercial Managed Care - HMO | Admitting: Family Medicine

## 2015-11-16 ENCOUNTER — Encounter: Payer: Self-pay | Admitting: Family Medicine

## 2015-11-16 VITALS — BP 120/70 | HR 70 | Temp 98.3°F | Ht 73.0 in | Wt 208.2 lb

## 2015-11-16 DIAGNOSIS — K5730 Diverticulosis of large intestine without perforation or abscess without bleeding: Secondary | ICD-10-CM | POA: Diagnosis not present

## 2015-11-16 DIAGNOSIS — R319 Hematuria, unspecified: Secondary | ICD-10-CM | POA: Diagnosis not present

## 2015-11-16 DIAGNOSIS — R310 Gross hematuria: Secondary | ICD-10-CM | POA: Diagnosis not present

## 2015-11-16 LAB — POCT UA - MICROSCOPIC ONLY

## 2015-11-16 LAB — BASIC METABOLIC PANEL WITH GFR
BUN: 17 mg/dL (ref 6–23)
CO2: 32 meq/L (ref 19–32)
Calcium: 9.5 mg/dL (ref 8.4–10.5)
Chloride: 104 meq/L (ref 96–112)
Creatinine, Ser: 0.98 mg/dL (ref 0.40–1.50)
GFR: 78.89 mL/min
Glucose, Bld: 145 mg/dL — ABNORMAL HIGH (ref 70–99)
Potassium: 4.1 meq/L (ref 3.5–5.1)
Sodium: 141 meq/L (ref 135–145)

## 2015-11-16 LAB — POC URINALSYSI DIPSTICK (AUTOMATED)
Nitrite, UA: POSITIVE
Spec Grav, UA: 1.015
Urobilinogen, UA: >=8
pH, UA: 7

## 2015-11-16 MED ORDER — IOHEXOL 300 MG/ML  SOLN
100.0000 mL | Freq: Once | INTRAMUSCULAR | Status: AC | PRN
  Administered 2015-11-16: 100 mL via INTRAVENOUS

## 2015-11-16 NOTE — Addendum Note (Signed)
Addended by: Carter Kitten on: 11/16/2015 03:32 PM   Modules accepted: Orders

## 2015-11-16 NOTE — Patient Instructions (Signed)

## 2015-11-16 NOTE — Progress Notes (Signed)
Dr. Frederico Hamman T. Breely Panik, MD, [facility] [place], [address] Phone: [phone] Fax: [phone]  11/16/2015  Patient: Frank Carlson, MRN: [id number], DOB: [birth year], 75 y.o.  Primary Physician:  Viviana Simpler, MD   Chief Complaint  Patient presents with  . Hematuria    Started on Sunday   Subjective:   Frank Carlson is a 75 y.o. 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 He is not having any pain. No trauma. No dysuria or polyuria. Does take ASA 81 mg daily, and he has stopped this and his naproxen. He otherwise is feeling fine.   Sunday - started on New Year's day.  NO accident.   2 months ago had a hernia operation laparascopically.  Does go to the gym 5 days a week.   Stopped aspirin and alleve.   Is having some blood clotting with urination.   Past Medical History, Surgical History, Social History, Family History, Problem List, Medications, and Allergies have been reviewed and updated if relevant.  Patient Active Problem List   Diagnosis Date Noted  . Right inguinal hernia 08/02/2015  . Anaphylaxis due to hymenoptera venom 06/20/2015  . Advance directive discussed with patient 03/28/2015  . Hyperlipidemia   . ED (erectile dysfunction)   . Diabetes mellitus with neurological manifestations, controlled ([place]) 12/08/2013  . Routine health maintenance 11/26/2011  . 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 Presbycusis of both ears 11/01/2009  . COLONIC POLYPS, HX OF 03/15/2008    Past Medical History  Diagnosis Date  . COLONIC POLYPS, HX OF 03/15/2008  . 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 CONCUSSION WITH LOC OF 30 MINUTES OR LESS 11/01/2009  . HEARING LOSS, BILATERAL 11/01/2009    Wears hearing aids  . JOINT STIFFNESS, HAND 10/28/2008  . Hyperlipidemia   . ED (erectile  dysfunction)   . Type II or unspecified type diabetes mellitus with neurological manifestations, not stated as uncontrolled 1/15    Type 2    Past Surgical History  Procedure Laterality Date  . Incision / drainage hand / finger Right   . Vasectomy    . Cataract extraction Bilateral 07/2010    OD with IOL  . Tonsillectomy    . Colonoscopy w/ polypectomy    . Inguinal hernia repair Right 08/23/2015    Procedure: LAPAROSCOPIC RIGHT INGUINAL HERNIA REPAIR WITH MESH;  Surgeon: Ralene Ok, MD;  Location: [facility];  Service: General;  Laterality: Right;  . Insertion of mesh Right 08/23/2015    Procedure: INSERTION OF MESH;  Surgeon: Ralene Ok, MD;  Location: [facility];  Service: General;  Laterality: Right;    Social History   Social History  . Marital Status: Married    Spouse Name: Lovey Newcomer  . Number of Children: 3  . Years of Education: 16   Occupational History  . Therapist, occupational     Retired   Social History Main Topics  . Smoking status: Former Smoker -- 2.00 packs/day for 5 years    Types: Cigarettes    Quit date: 11/25/1958  . Smokeless tobacco: Never Used  . Alcohol Use: No  . Drug Use: No  . Sexual Activity:    Partners: Female   Other Topics Concern  . Not on file   Social History Narrative   [employer]- Estate manager/land agent.    Work: [employer],  retired '92; [employer] until '97.    Married '64. 3 sons- '66, '68, '71; 5 grandchildren, 1 step g-dtr.       Has living will   Wife is health care POA   Would accept resuscitation attempts but no prolonged ventilatory support   Probably wouldn't want prolonged tube feeds    Family History  Problem Relation Age of Onset  . Alzheimer's disease Mother   . Coronary artery disease Father   . Coronary artery disease Other   . Diabetes Other     Allergies  Allergen Reactions  . Bee Venom     Passed out    Medication list reviewed and updated in full in [place].  GEN: No acute illnesses, no  fevers, chills. GI: No n/v/d, eating normally Pulm: No SOB Interactive and getting along well at home.  Otherwise, ROS is as per the HPI.  Objective:   BP 120/70 mmHg  Pulse 70  Temp(Src) 98.3 F (36.8 C) (Oral)  Ht 6\' 1"  (1.854 m)  Wt 208 lb 4 oz (94.462 kg)  BMI 27.48 kg/m2  GEN: WDWN, NAD, Non-toxic, A & O x 3 HEENT: Atraumatic, Normocephalic. Neck supple. No masses, No LAD. Ears and Nose: No external deformity. CV: RRR, No M/G/R. No JVD. No thrill. No extra heart sounds. PULM: CTA B, no wheezes, crackles, rhonchi. No retractions. No resp. distress. No accessory muscle use. EXTR: No c/c/e NEURO Normal gait.  PSYCH: Normally interactive. Conversant. Not depressed or anxious appearing.  Calm demeanor.  ABD: S, NT, ND, + BS, No rebound, No HSM  No CVAT  Laboratory and Imaging Data: Results for orders placed or performed in visit on 11/16/15  POCT Urinalysis Dipstick (Automated)  Result Value Ref Range   Color, UA Red    Clarity, UA Cloudy    Glucose, UA trace    Bilirubin, UA 2+    Ketones, UA 2+    Spec Grav, UA 1.015    Blood, UA large    pH, UA 7.0    Protein, UA 2+    Urobilinogen, UA >=8.0    Nitrite, UA positive    Leukocytes, UA large (3+) (A) Negative  POCT UA - Microscopic Only  Result Value Ref Range   WBC, Ur, HPF, POC     RBC, urine, microscopic TNTC    Bacteria, U Microscopic     Mucus, UA     Epithelial cells, urine per micros     Crystals, Ur, HPF, POC     Casts, Ur, LPF, POC     Yeast, UA       Assessment and Plan:   Gross hematuria - Plan: Ambulatory referral to Urology, CT Abdomen Pelvis W Wo Contrast, Basic metabolic panel, CANCELED: Basic metabolic panel  Hematuria - Plan: POCT Urinalysis Dipstick (Automated), POCT UA - Microscopic Only, Basic metabolic panel  I visually inspected urine myself, which was red with clot, also. D/w patient that we need to rule out a renal or bladder cancer, and we will obtain a CT of the abdomen and  pelvis with and without contrast to evaluate for neoplasm or potentially another renal injury.   Urgent urology consult for further work-up, as well.  Follow-up: No Follow-up on file.  Orders Placed This Encounter  Procedures  . CT Abdomen Pelvis W Wo Contrast  . Basic metabolic panel  . Ambulatory referral to Urology  . POCT Urinalysis Dipstick (Automated)  . POCT UA - Microscopic Only    Signed,  Jessah Danser T. Marialuiza Car, MD   Patient's Medications  New Prescriptions   No medications on file  Previous Medications   ASPIRIN 81 MG TABLET    Take 81 mg by mouth every evening.    CALCIUM CARB-ERGOCALCIFEROL (CHEWABLE CALCIUM/D PO)    Take 1 tablet by mouth.   EPINEPHRINE (EPIPEN 2-PAK) 0.3 MG/0.3 ML IJ SOAJ INJECTION    Inject 0.3 mLs (0.3 mg total) into the muscle once.   METFORMIN (GLUCOPHAGE) 500 MG TABLET    Take 1 tablet (500 mg total) by mouth daily with breakfast.   MULTIPLE VITAMIN (MULTIVITAMIN WITH MINERALS) TABS TABLET    Take 1 tablet by mouth daily.   OMEGA-3 FATTY ACIDS (FISH OIL) 1200 MG CAPS    Take 1 capsule by mouth daily.   SIMVASTATIN (ZOCOR) 20 MG TABLET    Take 1 tablet (20 mg total) by mouth at bedtime.  Modified Medications   No medications on file  Discontinued Medications   OXYCODONE-ACETAMINOPHEN (ROXICET) 5-325 MG TABLET    Take 1-2 tablets by mouth every 4 (four) hours as needed.

## 2015-11-16 NOTE — Progress Notes (Signed)
Pre visit review using our clinic review tool, if applicable. No additional management support is needed unless otherwise documented below in the visit note. 

## 2015-11-17 DIAGNOSIS — R310 Gross hematuria: Secondary | ICD-10-CM | POA: Diagnosis not present

## 2015-11-17 LAB — URINE CULTURE
COLONY COUNT: NO GROWTH
Organism ID, Bacteria: NO GROWTH

## 2015-12-16 DIAGNOSIS — R310 Gross hematuria: Secondary | ICD-10-CM | POA: Diagnosis not present

## 2016-01-02 DIAGNOSIS — N138 Other obstructive and reflux uropathy: Secondary | ICD-10-CM | POA: Diagnosis not present

## 2016-01-02 DIAGNOSIS — N401 Enlarged prostate with lower urinary tract symptoms: Secondary | ICD-10-CM | POA: Diagnosis not present

## 2016-01-02 DIAGNOSIS — R3912 Poor urinary stream: Secondary | ICD-10-CM | POA: Diagnosis not present

## 2016-01-02 DIAGNOSIS — Z0000 Encounter for general adult medical examination without abnormal findings: Secondary | ICD-10-CM | POA: Diagnosis not present

## 2016-03-16 LAB — HM DIABETES EYE EXAM

## 2016-03-30 DIAGNOSIS — Z0100 Encounter for examination of eyes and vision without abnormal findings: Secondary | ICD-10-CM | POA: Diagnosis not present

## 2016-03-30 DIAGNOSIS — E119 Type 2 diabetes mellitus without complications: Secondary | ICD-10-CM | POA: Diagnosis not present

## 2016-04-03 ENCOUNTER — Encounter: Payer: Self-pay | Admitting: Internal Medicine

## 2016-04-03 ENCOUNTER — Ambulatory Visit (INDEPENDENT_AMBULATORY_CARE_PROVIDER_SITE_OTHER): Payer: Commercial Managed Care - HMO | Admitting: Internal Medicine

## 2016-04-03 VITALS — BP 106/70 | HR 49 | Temp 97.6°F | Ht 72.0 in | Wt 205.0 lb

## 2016-04-03 DIAGNOSIS — N138 Other obstructive and reflux uropathy: Secondary | ICD-10-CM | POA: Insufficient documentation

## 2016-04-03 DIAGNOSIS — N400 Enlarged prostate without lower urinary tract symptoms: Secondary | ICD-10-CM | POA: Diagnosis not present

## 2016-04-03 DIAGNOSIS — E785 Hyperlipidemia, unspecified: Secondary | ICD-10-CM

## 2016-04-03 DIAGNOSIS — E0840 Diabetes mellitus due to underlying condition with diabetic neuropathy, unspecified: Secondary | ICD-10-CM

## 2016-04-03 DIAGNOSIS — Z1211 Encounter for screening for malignant neoplasm of colon: Secondary | ICD-10-CM

## 2016-04-03 DIAGNOSIS — Z0000 Encounter for general adult medical examination without abnormal findings: Secondary | ICD-10-CM

## 2016-04-03 DIAGNOSIS — E7800 Pure hypercholesterolemia, unspecified: Secondary | ICD-10-CM | POA: Diagnosis not present

## 2016-04-03 DIAGNOSIS — Z7189 Other specified counseling: Secondary | ICD-10-CM

## 2016-04-03 DIAGNOSIS — N401 Enlarged prostate with lower urinary tract symptoms: Secondary | ICD-10-CM

## 2016-04-03 LAB — CBC WITH DIFFERENTIAL/PLATELET
BASOS PCT: 0.3 % (ref 0.0–3.0)
Basophils Absolute: 0 10*3/uL (ref 0.0–0.1)
EOS ABS: 0 10*3/uL (ref 0.0–0.7)
EOS PCT: 0.2 % (ref 0.0–5.0)
HEMATOCRIT: 42.4 % (ref 39.0–52.0)
HEMOGLOBIN: 14.2 g/dL (ref 13.0–17.0)
LYMPHS PCT: 34.5 % (ref 12.0–46.0)
Lymphs Abs: 2.3 10*3/uL (ref 0.7–4.0)
MCHC: 33.4 g/dL (ref 30.0–36.0)
MCV: 85.3 fl (ref 78.0–100.0)
Monocytes Absolute: 0.7 10*3/uL (ref 0.1–1.0)
Monocytes Relative: 10 % (ref 3.0–12.0)
Neutro Abs: 3.6 10*3/uL (ref 1.4–7.7)
Neutrophils Relative %: 55 % (ref 43.0–77.0)
Platelets: 176 10*3/uL (ref 150.0–400.0)
RBC: 4.97 Mil/uL (ref 4.22–5.81)
RDW: 14.6 % (ref 11.5–15.5)
WBC: 6.6 10*3/uL (ref 4.0–10.5)

## 2016-04-03 LAB — HEMOGLOBIN A1C: Hgb A1c MFr Bld: 6.7 % — ABNORMAL HIGH (ref 4.6–6.5)

## 2016-04-03 LAB — COMPREHENSIVE METABOLIC PANEL
ALBUMIN: 4.3 g/dL (ref 3.5–5.2)
ALK PHOS: 42 U/L (ref 39–117)
ALT: 17 U/L (ref 0–53)
AST: 18 U/L (ref 0–37)
BILIRUBIN TOTAL: 0.4 mg/dL (ref 0.2–1.2)
BUN: 15 mg/dL (ref 6–23)
CALCIUM: 9.6 mg/dL (ref 8.4–10.5)
CO2: 31 mEq/L (ref 19–32)
CREATININE: 1.06 mg/dL (ref 0.40–1.50)
Chloride: 104 mEq/L (ref 96–112)
GFR: 71.99 mL/min (ref >60.00)
Glucose, Bld: 101 mg/dL — ABNORMAL HIGH (ref 70–99)
Potassium: 4.5 mEq/L (ref 3.5–5.1)
Sodium: 140 mEq/L (ref 135–145)
TOTAL PROTEIN: 7.3 g/dL (ref 6.0–8.3)

## 2016-04-03 LAB — LIPID PANEL
CHOLESTEROL: 137 mg/dL (ref 0–200)
HDL: 24.4 mg/dL — AB (ref >39.00)
LDL CALC: 90 mg/dL (ref 0–99)
NonHDL: 112.7
Total CHOL/HDL Ratio: 6
Triglycerides: 114 mg/dL (ref 0.0–149.0)
VLDL: 22.8 mg/dL (ref 0.0–40.0)

## 2016-04-03 LAB — MICROALBUMIN / CREATININE URINE RATIO
Creatinine,U: 53.5 mg/dL
Microalb Creat Ratio: 1.3 mg/g (ref 0.0–30.0)
Microalb, Ur: <0.7 mg/dL (ref 0.0–1.9)

## 2016-04-03 LAB — T4, FREE: FREE T4: 0.86 ng/dL (ref 0.60–1.60)

## 2016-04-03 LAB — HM DIABETES FOOT EXAM

## 2016-04-03 MED ORDER — METFORMIN HCL 500 MG PO TABS: 500.0000 mg | ORAL_TABLET | Freq: Every day | ORAL | Status: DC

## 2016-04-03 MED ORDER — SIMVASTATIN 20 MG PO TABS: 20.0000 mg | ORAL_TABLET | Freq: Every day | ORAL | Status: DC
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
## 2016-04-03 NOTE — Progress Notes (Signed)
Pre visit review using our clinic review tool, if applicable. No additional management support is needed unless otherwise documented below in the visit note. 

## 2016-04-03 NOTE — Assessment & Plan Note (Signed)
See social history 

## 2016-04-03 NOTE — Assessment & Plan Note (Signed)
Still seems to be well controlled If A1c still low--will just do yearly follow up

## 2016-04-03 NOTE — Assessment & Plan Note (Signed)
Voiding better on dual therapy No further hematuria

## 2016-04-03 NOTE — Assessment & Plan Note (Signed)
I have personally reviewed the Medicare Annual Wellness questionnaire and have noted 1. The patient's medical and social history 2. Their use of alcohol, tobacco or illicit drugs 3. Their current medications and supplements 4. The patient's functional ability including ADL's, fall risks, home safety risks and hearing or visual             impairment. 5. Diet and physical activities 6. Evidence for depression or mood disorders  The patients weight, height, BMI and visual acuity have been recorded in the chart I have made referrals, counseling and provided education to the patient based review of the above and I have provided the pt with a written personalized care plan for preventive services.  I have provided you with a copy of your personalized plan for preventive services. Please take the time to review along with your updated medication list.  UTD on imms--yearly flu Discussed lifestyle Discussed colon---will just check FIT instead

## 2016-04-03 NOTE — Assessment & Plan Note (Signed)
Has had good control Will recheck labs

## 2016-07-20 ENCOUNTER — Ambulatory Visit (INDEPENDENT_AMBULATORY_CARE_PROVIDER_SITE_OTHER): Payer: Commercial Managed Care - HMO

## 2016-07-20 ENCOUNTER — Ambulatory Visit: Payer: Commercial Managed Care - HMO

## 2016-07-20 DIAGNOSIS — Z23 Encounter for immunization: Secondary | ICD-10-CM | POA: Diagnosis not present

## 2016-08-07 DIAGNOSIS — R310 Gross hematuria: Secondary | ICD-10-CM | POA: Diagnosis not present

## 2016-08-07 DIAGNOSIS — R351 Nocturia: Secondary | ICD-10-CM | POA: Diagnosis not present

## 2016-08-07 DIAGNOSIS — N401 Enlarged prostate with lower urinary tract symptoms: Secondary | ICD-10-CM | POA: Diagnosis not present

## 2016-11-19 ENCOUNTER — Telehealth: Payer: Self-pay | Admitting: *Deleted

## 2016-11-19 MED ORDER — TRUEPLUS LANCETS 30G MISC: 1.0000 [IU] | Freq: Every day | 3 refills | Status: DC

## 2016-11-19 MED ORDER — TRUE METRIX AIR GLUCOSE METER DEVI: 1.0000 | Freq: Once | 0 refills | Status: AC

## 2016-11-19 MED ORDER — GLUCOSE BLOOD VI STRP: ORAL_STRIP | 3 refills | Status: DC

## 2016-11-19 NOTE — Telephone Encounter (Signed)
Patient left a voicemail stating that [facility] will be sending over a faxed requested for diabetic supplies. Patient stated that he was told by the representative with [facility] to call and let you know that he will be using them for the supplies and wanted to let you be aware of this.

## 2016-11-19 NOTE — Telephone Encounter (Signed)
Rxs sent electronically.  

## 2016-11-19 NOTE — Telephone Encounter (Signed)
Fax received for DM Testing Supplies

## 2016-11-29 ENCOUNTER — Encounter: Payer: Self-pay | Admitting: Internal Medicine

## 2016-11-30 NOTE — Telephone Encounter (Signed)
Please contact and set him up an appt next week or so

## 2016-12-03 ENCOUNTER — Other Ambulatory Visit: Payer: Self-pay

## 2016-12-03 ENCOUNTER — Encounter: Payer: Self-pay | Admitting: Internal Medicine

## 2016-12-03 ENCOUNTER — Ambulatory Visit (INDEPENDENT_AMBULATORY_CARE_PROVIDER_SITE_OTHER): Payer: Commercial Managed Care - HMO | Admitting: Internal Medicine

## 2016-12-03 VITALS — BP 130/82 | HR 57 | Temp 97.5°F | Wt 214.0 lb

## 2016-12-03 DIAGNOSIS — L821 Other seborrheic keratosis: Secondary | ICD-10-CM | POA: Insufficient documentation

## 2016-12-03 MED ORDER — ALCOHOL SWABS PADS
1.0000 | MEDICATED_PAD | 3 refills | Status: DC | PRN
Start: 2016-12-03 — End: 2020-06-28

## 2016-12-03 MED ORDER — TRUEPLUS LANCETS 30G MISC: 1.0000 [IU] | Freq: Every day | 3 refills | Status: DC

## 2016-12-03 MED ORDER — TRUE METRIX LEVEL 1 LOW VI SOLN: 1.0000 | Freq: Once | 0 refills | Status: AC

## 2016-12-03 MED ORDER — GLUCOSE BLOOD VI STRP: ORAL_STRIP | 3 refills | Status: DC

## 2016-12-03 MED ORDER — TRUE METRIX AIR GLUCOSE METER W/DEVICE KIT: 1.0000 [IU] | PACK | Freq: Once | 0 refills | Status: AC

## 2016-12-03 NOTE — Telephone Encounter (Signed)
Rx sent electronically.  

## 2016-12-03 NOTE — Progress Notes (Signed)
Subjective:    Patient ID: Frank Carlson, male    DOB: [birth year], 76 y.o.   MRN: [id number]  HPI Here due to a color change in his nipples  Wife noticed this about a week ago She sees this in the bottom of his nipples--- he hasn't noticed it much Googled it--- concerned about cancer No lumps or masses No pain No discharge from the nipple  Current Outpatient Prescriptions on File Prior to Visit  Medication Sig Dispense Refill  . aspirin 81 MG tablet Take 81 mg by mouth every evening.     . Calcium Carb-Ergocalciferol (CHEWABLE CALCIUM/D PO) Take 1 tablet by mouth.    . EPINEPHrine (EPIPEN 2-PAK) 0.3 mg/0.3 mL IJ SOAJ injection Inject 0.3 mLs (0.3 mg total) into the muscle once. 1 Device 1  . finasteride (PROSCAR) 5 MG tablet Take 1 tablet by mouth daily.    Marland Kitchen glucose blood (TRUE METRIX BLOOD GLUCOSE TEST) test strip Use to test blood sugar once a day. Dx Code: E08.40 100 each 3  . metFORMIN (GLUCOPHAGE) 500 MG tablet Take 1 tablet (500 mg total) by mouth daily with breakfast. 90 tablet 3  . Multiple Vitamin (MULTIVITAMIN WITH MINERALS) TABS tablet Take 1 tablet by mouth daily.    . Omega-3 Fatty Acids (FISH OIL) 1200 MG CAPS Take 1 capsule by mouth daily.    . simvastatin (ZOCOR) 20 MG tablet Take 1 tablet (20 mg total) by mouth at bedtime. 90 tablet 3  . TRUEPLUS LANCETS 30G MISC 1 Units by Does not apply route daily. Use to check blood sugar once a day. Dx Code E08.40 100 each 3   No current facility-administered medications on file prior to visit.     Allergies  Allergen Reactions  . Bee Venom     Passed out    Past Medical History:  Diagnosis Date  . BPH (benign prostatic hypertrophy)   . COLONIC POLYPS, HX OF 03/15/2008  . 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 CONCUSSION WITH LOC OF 30 MINUTES OR LESS 11/01/2009  . ED (erectile dysfunction)   . HEARING LOSS, BILATERAL 11/01/2009   Wears hearing aids  . Hyperlipidemia   . JOINT STIFFNESS, HAND 10/28/2008  . Type II or  unspecified type diabetes mellitus with neurological manifestations, not stated as uncontrolled(250.60) 1/15   Type 2    Past Surgical History:  Procedure Laterality Date  . CATARACT EXTRACTION Bilateral 07/2010   OD with IOL  . COLONOSCOPY W/ POLYPECTOMY    . INCISION / DRAINAGE HAND / FINGER Right   . INGUINAL HERNIA REPAIR Right 08/23/2015   Procedure: LAPAROSCOPIC RIGHT INGUINAL HERNIA REPAIR WITH MESH;  Surgeon: Ralene Ok, MD;  Location: [facility];  Service: General;  Laterality: Right;  . INSERTION OF MESH Right 08/23/2015   Procedure: INSERTION OF MESH;  Surgeon: Ralene Ok, MD;  Location: [facility];  Service: General;  Laterality: Right;  . TONSILLECTOMY    . VASECTOMY      Family History  Problem Relation Age of Onset  . Alzheimer's disease Mother   . Coronary artery disease Father   . Coronary artery disease Other   . Diabetes Other     Social History   Social History  . Marital status: Married    Spouse name: Lovey Newcomer  . Number of children: 3  . Years of education: 63   Occupational History  . Therapist, occupational     Retired   Social History Main Topics  . Smoking status: Former  Smoker    Packs/day: 2.00    Years: 5.00    Types: Cigarettes    Quit date: 11/25/1958  . Smokeless tobacco: Never Used  . Alcohol use No  . Drug use: No  . Sexual activity: Yes    Partners: Female   Other Topics Concern  . Not on file   Social History Narrative   [employer]- Estate manager/land agent.    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    Married '64. 3 sons- '66, '68, '71; 5 grandchildren, 1 step g-dtr.       Has living will   Wife is health care POA   Would accept resuscitation attempts but no prolonged ventilatory support   Probably wouldn't want prolonged tube feeds   Review of Systems  No fever No other skin changes     Objective:   Physical Exam  Genitourinary:  Genitourinary Comments: No breast masses Does have proliferative dark  keratotic changes on lower part of both areola          Assessment & Plan:

## 2016-12-03 NOTE — Assessment & Plan Note (Signed)
On both areola Reassured--doesn't seem to indicate breast cancer. Unusual location and bilateral I recommended evaluation by a dermatologist

## 2017-01-09 DIAGNOSIS — D229 Melanocytic nevi, unspecified: Secondary | ICD-10-CM | POA: Diagnosis not present

## 2017-01-09 DIAGNOSIS — L720 Epidermal cyst: Secondary | ICD-10-CM | POA: Diagnosis not present

## 2017-01-09 DIAGNOSIS — L821 Other seborrheic keratosis: Secondary | ICD-10-CM | POA: Diagnosis not present

## 2017-03-18 ENCOUNTER — Encounter: Payer: Self-pay | Admitting: Internal Medicine

## 2017-03-18 ENCOUNTER — Ambulatory Visit (INDEPENDENT_AMBULATORY_CARE_PROVIDER_SITE_OTHER): Payer: Medicare HMO | Admitting: Internal Medicine

## 2017-03-18 VITALS — BP 126/80 | HR 56 | Temp 98.0°F | Wt 210.2 lb

## 2017-03-18 DIAGNOSIS — T07XXXA Unspecified multiple injuries, initial encounter: Secondary | ICD-10-CM | POA: Diagnosis not present

## 2017-03-18 NOTE — Assessment & Plan Note (Signed)
Fortunately, no major injury Discussed getting rid of the ladder No specific Rx needed

## 2017-03-18 NOTE — Progress Notes (Signed)
Pre visit review using our clinic review tool, if applicable. No additional management support is needed unless otherwise documented below in the visit note. 

## 2017-03-18 NOTE — Progress Notes (Signed)
Subjective:    Patient ID: Frank Carlson, male    DOB: 1940-11-23, 76 y.o.   MRN: [id number]  HPI He comes in due to injury when ladder fell-- 4/26 Wanted to do some reshingling for a leak Was climbing up to get to roof---the ladder slid away from the roof He went down to the ground on top of the ladder  He had a number of cuts  Big scrapes on extensor right arm Noticed discoloration at extensor left wrist 3 days ago Slight tenderness at wrist but normal ROM Also has a lot of soreness at lateral right patella  Does have some pain walking--but not limping Not swollen  Current Outpatient Prescriptions on File Prior to Visit  Medication Sig Dispense Refill  . Alcohol Swabs PADS 1 Package by Does not apply route as needed. 100 each 3  . Calcium Carb-Ergocalciferol (CHEWABLE CALCIUM/D PO) Take 1 tablet by mouth.    . EPINEPHrine (EPIPEN 2-PAK) 0.3 mg/0.3 mL IJ SOAJ injection Inject 0.3 mLs (0.3 mg total) into the muscle once. 1 Device 1  . glucose blood (TRUE METRIX BLOOD GLUCOSE TEST) test strip Use to test blood sugar once a day. Dx Code: E08.40 (Patient taking differently: Use to test blood sugar twice a month  Dx Code: E08.40) 100 each 3  . metFORMIN (GLUCOPHAGE) 500 MG tablet Take 1 tablet (500 mg total) by mouth daily with breakfast. 90 tablet 3  . Multiple Vitamin (MULTIVITAMIN WITH MINERALS) TABS tablet Take 1 tablet by mouth daily.    . Omega-3 Fatty Acids (FISH OIL) 1200 MG CAPS Take 1 capsule by mouth daily.    . simvastatin (ZOCOR) 20 MG tablet Take 1 tablet (20 mg total) by mouth at bedtime. 90 tablet 3  . TRUEPLUS LANCETS 30G MISC 1 Units by Does not apply route daily. Use to check blood sugar once a day. Dx Code H21.22 (Patient taking differently: 1 Units by Does not apply route. Use to check blood sugar twice a month. Dx Code E08.40) 100 each 3  . finasteride (PROSCAR) 5 MG tablet Take 1 tablet by mouth daily.     No current facility-administered medications on file  prior to visit.     Allergies  Allergen Reactions  . Bee Venom     Passed out    Past Medical History:  Diagnosis Date  . BPH (benign prostatic hypertrophy)   . COLONIC POLYPS, HX OF 03/15/2008  . 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 CONCUSSION WITH LOC OF 30 MINUTES OR LESS 11/01/2009  . ED (erectile dysfunction)   . HEARING LOSS, BILATERAL 11/01/2009   Wears hearing aids  . Hyperlipidemia   . JOINT STIFFNESS, HAND 10/28/2008  . Type II or unspecified type diabetes mellitus with neurological manifestations, not stated as uncontrolled(250.60) 1/15   Type 2    Past Surgical History:  Procedure Laterality Date  . CATARACT EXTRACTION Bilateral 07/2010   OD with IOL  . COLONOSCOPY W/ POLYPECTOMY    . INCISION / DRAINAGE HAND / FINGER Right   . INGUINAL HERNIA REPAIR Right 08/23/2015   Procedure: LAPAROSCOPIC RIGHT INGUINAL HERNIA REPAIR WITH MESH;  Surgeon: Ralene Ok, MD;  Location: [place];  Service: General;  Laterality: Right;  . INSERTION OF MESH Right 08/23/2015   Procedure: INSERTION OF MESH;  Surgeon: Ralene Ok, MD;  Location: [place];  Service: General;  Laterality: Right;  . TONSILLECTOMY    . VASECTOMY      Family History  Problem Relation Age of  Onset  . Alzheimer's disease Mother   . Coronary artery disease Father   . Coronary artery disease Other   . Diabetes Other     Social History   Social History  . Marital status: Married    Spouse name: Lovey Newcomer  . Number of children: 3  . Years of education: 29   Occupational History  . Therapist, occupational     Retired   Social History Main Topics  . Smoking status: Former Smoker    Packs/day: 2.00    Years: 5.00    Types: Cigarettes    Quit date: 11/25/1958  . Smokeless tobacco: Never Used  . Alcohol use No  . Drug use: No  . Sexual activity: Yes    Partners: Female   Other Topics Concern  . Not on file   Social History Narrative   [employer]- Estate manager/land agent.    Work: [employer], retired  '92; [employer] until '97.    Married '64. 3 sons- '66, '68, '71; 5 grandchildren, 1 step g-dtr.       Has living will   Wife is health care POA   Would accept resuscitation attempts but no prolonged ventilatory support   Probably wouldn't want prolonged tube feeds   Review of Systems  Didn't hit head No fever     Objective:   Physical Exam  Constitutional: He appears well-nourished. No distress.  Musculoskeletal:  Yellowing contusion on both sides of extensor left wrist. Normal ROM and strength there  Slight contusion upper right tibia--not really tender Slight crepitus in right knee--but ROM is fine  Neurological:  Normal gait  Skin:  2 healing abrasions on right extensor forearm---no infection          Assessment & Plan:

## 2017-03-28 ENCOUNTER — Encounter: Payer: Self-pay | Admitting: Internal Medicine

## 2017-03-28 NOTE — Telephone Encounter (Signed)
Please resend and let him know. Thanks

## 2017-04-05 ENCOUNTER — Encounter: Payer: Commercial Managed Care - HMO | Admitting: Internal Medicine

## 2017-04-06 ENCOUNTER — Encounter: Payer: Self-pay | Admitting: Internal Medicine

## 2017-04-06 MED ORDER — METFORMIN HCL 500 MG PO TABS: 500.0000 mg | ORAL_TABLET | Freq: Every day | ORAL | 0 refills | Status: DC

## 2017-04-17 ENCOUNTER — Encounter: Payer: Self-pay | Admitting: Internal Medicine

## 2017-04-17 ENCOUNTER — Other Ambulatory Visit: Payer: Self-pay | Admitting: Internal Medicine

## 2017-04-17 ENCOUNTER — Ambulatory Visit (INDEPENDENT_AMBULATORY_CARE_PROVIDER_SITE_OTHER): Payer: Medicare HMO | Admitting: Internal Medicine

## 2017-04-17 VITALS — BP 112/76 | HR 48 | Temp 97.9°F | Ht 71.5 in | Wt 207.0 lb

## 2017-04-17 DIAGNOSIS — E785 Hyperlipidemia, unspecified: Secondary | ICD-10-CM

## 2017-04-17 DIAGNOSIS — N138 Other obstructive and reflux uropathy: Secondary | ICD-10-CM

## 2017-04-17 DIAGNOSIS — E7800 Pure hypercholesterolemia, unspecified: Secondary | ICD-10-CM

## 2017-04-17 DIAGNOSIS — Z1211 Encounter for screening for malignant neoplasm of colon: Secondary | ICD-10-CM

## 2017-04-17 DIAGNOSIS — E0840 Diabetes mellitus due to underlying condition with diabetic neuropathy, unspecified: Secondary | ICD-10-CM | POA: Diagnosis not present

## 2017-04-17 DIAGNOSIS — Z0000 Encounter for general adult medical examination without abnormal findings: Secondary | ICD-10-CM

## 2017-04-17 DIAGNOSIS — N401 Enlarged prostate with lower urinary tract symptoms: Secondary | ICD-10-CM | POA: Diagnosis not present

## 2017-04-17 DIAGNOSIS — Z23 Encounter for immunization: Secondary | ICD-10-CM | POA: Diagnosis not present

## 2017-04-17 DIAGNOSIS — Z7189 Other specified counseling: Secondary | ICD-10-CM | POA: Diagnosis not present

## 2017-04-17 LAB — COMPREHENSIVE METABOLIC PANEL
ALT: 16 U/L (ref 0–53)
AST: 19 U/L (ref 0–37)
Albumin: 4.2 g/dL (ref 3.5–5.2)
Alkaline Phosphatase: 57 U/L (ref 39–117)
BUN: 15 mg/dL (ref 6–23)
CHLORIDE: 103 meq/L (ref 96–112)
CO2: 31 meq/L (ref 19–32)
Calcium: 9.9 mg/dL (ref 8.4–10.5)
Creatinine, Ser: 1.11 mg/dL (ref 0.40–1.50)
GFR: 68.07 mL/min (ref >60.00)
GLUCOSE: 107 mg/dL — AB (ref 70–99)
POTASSIUM: 4.5 meq/L (ref 3.5–5.1)
SODIUM: 141 meq/L (ref 135–145)
Total Bilirubin: 0.5 mg/dL (ref 0.2–1.2)
Total Protein: 7.4 g/dL (ref 6.0–8.3)

## 2017-04-17 LAB — HEMOGLOBIN A1C: HEMOGLOBIN A1C: 6.6 % — AB (ref 4.6–6.5)

## 2017-04-17 LAB — CBC WITH DIFFERENTIAL/PLATELET
BASOS PCT: 0.3 % (ref 0.0–3.0)
Basophils Absolute: 0 10*3/uL (ref 0.0–0.1)
Eosinophils Absolute: 0 10*3/uL (ref 0.0–0.7)
Eosinophils Relative: 0.1 % (ref 0.0–5.0)
HCT: 42.2 % (ref 39.0–52.0)
HEMOGLOBIN: 14 g/dL (ref 13.0–17.0)
LYMPHS ABS: 2.2 10*3/uL (ref 0.7–4.0)
Lymphocytes Relative: 38.2 % (ref 12.0–46.0)
MCHC: 33.2 g/dL (ref 30.0–36.0)
MCV: 87.6 fl (ref 78.0–100.0)
Monocytes Absolute: 0.6 10*3/uL (ref 0.1–1.0)
Monocytes Relative: 9.8 % (ref 3.0–12.0)
NEUTROS ABS: 2.9 10*3/uL (ref 1.4–7.7)
NEUTROS PCT: 51.6 % (ref 43.0–77.0)
PLATELETS: 155 10*3/uL (ref 150.0–400.0)
RBC: 4.82 Mil/uL (ref 4.22–5.81)
RDW: 14.1 % (ref 11.5–15.5)
WBC: 5.7 10*3/uL (ref 4.0–10.5)

## 2017-04-17 LAB — HM DIABETES FOOT EXAM

## 2017-04-17 LAB — MICROALBUMIN / CREATININE URINE RATIO
Creatinine,U: 78 mg/dL
Microalb Creat Ratio: 0.9 mg/g (ref 0.0–30.0)

## 2017-04-17 LAB — LIPID PANEL
CHOLESTEROL: 138 mg/dL (ref 0–200)
HDL: 29.4 mg/dL — ABNORMAL LOW (ref >39.00)
LDL Cholesterol: 82 mg/dL (ref 0–99)
NonHDL: 108.58
Total CHOL/HDL Ratio: 5
Triglycerides: 132 mg/dL (ref 0.0–149.0)
VLDL: 26.4 mg/dL (ref 0.0–40.0)

## 2017-04-17 MED ORDER — SIMVASTATIN 20 MG PO TABS: 20.0000 mg | ORAL_TABLET | Freq: Every day | ORAL | 3 refills | Status: DC

## 2017-04-17 MED ORDER — METFORMIN HCL 500 MG PO TABS: 500.0000 mg | ORAL_TABLET | Freq: Every day | ORAL | 3 refills | Status: DC

## 2017-04-17 NOTE — Assessment & Plan Note (Signed)
I have personally reviewed the Medicare Annual Wellness questionnaire and have noted 1. The patient's medical and social history 2. Their use of alcohol, tobacco or illicit drugs 3. Their current medications and supplements 4. The patient's functional ability including ADL's, fall risks, home safety risks and hearing or visual             impairment. 5. Diet and physical activities 6. Evidence for depression or mood disorders  The patients weight, height, BMI and visual acuity have been recorded in the chart I have made referrals, counseling and provided education to the patient based review of the above and I have provided the pt with a written personalized care plan for preventive services.  I have provided you with a copy of your personalized plan for preventive services. Please take the time to review along with your updated medication list.  Update pneumovax No PSA due to age Will do FIT Keeps fit

## 2017-04-17 NOTE — Assessment & Plan Note (Signed)
See social history 

## 2017-04-17 NOTE — Assessment & Plan Note (Signed)
Still seems to have good control Mild foot numbness without pain

## 2017-04-17 NOTE — Assessment & Plan Note (Signed)
Minimal symptoms

## 2017-04-17 NOTE — Addendum Note (Signed)
Addended by: Pilar Grammes on: 04/17/2017 11:27 AM   Modules accepted: Orders

## 2017-04-17 NOTE — Assessment & Plan Note (Signed)
No problems with statin 

## 2017-04-17 NOTE — Progress Notes (Signed)
Subjective:    Patient ID: Frank Carlson, male    DOB: 1940/12/18, 76 y.o.   MRN: [id number]  HPI Here for Medicare wellness visit and follow up of chronic health conditions Reviewed form and advanced directives Reviewed other doctors No alcohol or tobacco Did have the 1 fall with injury--seen last month (basically recovered) Goes to gym 5 days per week Vision is okay Hearing aides--they help considerably No depression or anhedonia Independent with instrumental ADLs No sig memory problems  Just found non engorged tick on left forearm Did clean branches 2 days ago Not sure when it got on  Checks sugars twice a month Fasting 89-117 No hypoglycemic reactions Has appt for eye exam Mild numbness in feet still--no sig pain  On simvastatin for cholesterol No myalgia or GI problems  Voids okay Nocturia x 1 usually. No daytime urgency or frequency  Current Outpatient Prescriptions on File Prior to Visit  Medication Sig Dispense Refill  . Alcohol Swabs PADS 1 Package by Does not apply route as needed. 100 each 3  . Calcium Carb-Ergocalciferol (CHEWABLE CALCIUM/D PO) Take 1 tablet by mouth.    . EPINEPHrine (EPIPEN 2-PAK) 0.3 mg/0.3 mL IJ SOAJ injection Inject 0.3 mLs (0.3 mg total) into the muscle once. 1 Device 1  . glucose blood (TRUE METRIX BLOOD GLUCOSE TEST) test strip Use to test blood sugar once a day. Dx Code: E08.40 (Patient taking differently: Use to test blood sugar twice a month  Dx Code: E08.40) 100 each 3  . metFORMIN (GLUCOPHAGE) 500 MG tablet Take 1 tablet (500 mg total) by mouth daily with breakfast. 90 tablet 0  . Multiple Vitamin (MULTIVITAMIN WITH MINERALS) TABS tablet Take 1 tablet by mouth daily.    . Omega-3 Fatty Acids (FISH OIL) 1200 MG CAPS Take 1 capsule by mouth daily.    . simvastatin (ZOCOR) 20 MG tablet Take 1 tablet (20 mg total) by mouth at bedtime. 90 tablet 3  . TRUEPLUS LANCETS 30G MISC 1 Units by Does not apply route daily. Use to check  blood sugar once a day. Dx Code Q11.94 (Patient taking differently: 1 Units by Does not apply route. Use to check blood sugar twice a month. Dx Code E08.40) 100 each 3   No current facility-administered medications on file prior to visit.     Allergies  Allergen Reactions  . Bee Venom     Passed out    Past Medical History:  Diagnosis Date  . BPH (benign prostatic hypertrophy)   . COLONIC POLYPS, HX OF 03/15/2008  . 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 CONCUSSION WITH LOC OF 30 MINUTES OR LESS 11/01/2009  . ED (erectile dysfunction)   . HEARING LOSS, BILATERAL 11/01/2009   Wears hearing aids  . Hyperlipidemia   . JOINT STIFFNESS, HAND 10/28/2008  . Type II or unspecified type diabetes mellitus with neurological manifestations, not stated as uncontrolled(250.60) 1/15   Type 2    Past Surgical History:  Procedure Laterality Date  . CATARACT EXTRACTION Bilateral 07/2010   OD with IOL  . COLONOSCOPY W/ POLYPECTOMY    . INCISION / DRAINAGE HAND / FINGER Right   . INGUINAL HERNIA REPAIR Right 08/23/2015   Procedure: LAPAROSCOPIC RIGHT INGUINAL HERNIA REPAIR WITH MESH;  Surgeon: Ralene Ok, MD;  Location: [place];  Service: General;  Laterality: Right;  . INSERTION OF MESH Right 08/23/2015   Procedure: INSERTION OF MESH;  Surgeon: Ralene Ok, MD;  Location: [place];  Service: General;  Laterality: Right;  .  TONSILLECTOMY    . VASECTOMY      Family History  Problem Relation Age of Onset  . Alzheimer's disease Mother   . Dementia Mother   . Coronary artery disease Father   . Coronary artery disease Other   . Diabetes Other   . Dementia Brother     Social History   Social History  . Marital status: Married    Spouse name: Lovey Newcomer  . Number of children: 3  . Years of education: 45   Occupational History  . Therapist, occupational     Retired   Social History Main Topics  . Smoking status: Former Smoker    Packs/day: 2.00    Years: 5.00    Types: Cigarettes     Quit date: 11/25/1958  . Smokeless tobacco: Never Used  . Alcohol use No  . Drug use: No  . Sexual activity: Yes    Partners: Female   Other Topics Concern  . Not on file   Social History Narrative   [employer]- Estate manager/land agent.    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    Married '64. 3 sons- '66, '68, '71; 5 grandchildren, 1 step g-dtr.       Has living will   Wife is health care POA--then son Mitzi Hansen   Would accept resuscitation attempts but no prolonged ventilatory support   Probably wouldn't want prolonged tube feeds   Review of Systems Usual aching after exercise but no sig arthritis Appetite is good Weight stable Wears seat belt Teeth good--- keeps up with dentist (Dr Dayton Scrape) Bowels are fine--no blood. No sig back or joint pains regularly (chronic low back pain that is mild) No skin rash or suspicious lesions (has seen Dr Denna Haggard) No chest pain or SOB No dizziness or syncope No edema No heartburn or dysphagia    Objective:   Physical Exam  Constitutional: He is oriented to person, place, and time. He appears well-developed. No distress.  HENT:  Mouth/Throat: Oropharynx is clear and moist. No oropharyngeal exudate.  Neck: No thyromegaly present.  Cardiovascular: Normal rate, regular rhythm, normal heart sounds and intact distal pulses.  Exam reveals no gallop.   No murmur heard. Pulmonary/Chest: Effort normal and breath sounds normal. No respiratory distress. He has no wheezes. He has no rales.  Abdominal: Soft. There is no tenderness.  Musculoskeletal: He exhibits no edema or tenderness.  Lymphadenopathy:    He has no cervical adenopathy.  Neurological: He is alert and oriented to person, place, and time.  President--- "Daisy Floro, Obama, Bush" [phone] D-l-r-o-w Recall 3/3  Mild decreased sensation in feet  Skin: No rash noted. No erythema.  No foot lesions Tick removed with forceps without incident  Psychiatric: He has a normal  mood and affect. His behavior is normal.          Assessment & Plan:

## 2017-04-23 ENCOUNTER — Other Ambulatory Visit (INDEPENDENT_AMBULATORY_CARE_PROVIDER_SITE_OTHER): Payer: Medicare HMO

## 2017-04-23 DIAGNOSIS — Z1211 Encounter for screening for malignant neoplasm of colon: Secondary | ICD-10-CM | POA: Diagnosis not present

## 2017-04-23 LAB — FECAL OCCULT BLOOD, IMMUNOCHEMICAL: Fecal Occult Bld: NEGATIVE

## 2018-02-13 ENCOUNTER — Encounter: Payer: Self-pay | Admitting: Internal Medicine

## 2018-02-26 ENCOUNTER — Other Ambulatory Visit: Payer: Self-pay | Admitting: Dermatology

## 2018-02-26 DIAGNOSIS — C44329 Squamous cell carcinoma of skin of other parts of face: Secondary | ICD-10-CM | POA: Diagnosis not present

## 2018-03-14 DIAGNOSIS — E119 Type 2 diabetes mellitus without complications: Secondary | ICD-10-CM | POA: Diagnosis not present

## 2018-03-18 DIAGNOSIS — C44329 Squamous cell carcinoma of skin of other parts of face: Secondary | ICD-10-CM | POA: Diagnosis not present

## 2018-04-18 ENCOUNTER — Ambulatory Visit (INDEPENDENT_AMBULATORY_CARE_PROVIDER_SITE_OTHER): Payer: Medicare HMO | Admitting: Internal Medicine

## 2018-04-18 ENCOUNTER — Encounter: Payer: Self-pay | Admitting: Internal Medicine

## 2018-04-18 VITALS — BP 128/74 | HR 45 | Temp 97.9°F | Ht 71.25 in | Wt 208.0 lb

## 2018-04-18 DIAGNOSIS — E0840 Diabetes mellitus due to underlying condition with diabetic neuropathy, unspecified: Secondary | ICD-10-CM

## 2018-04-18 DIAGNOSIS — N401 Enlarged prostate with lower urinary tract symptoms: Secondary | ICD-10-CM | POA: Diagnosis not present

## 2018-04-18 DIAGNOSIS — Z7189 Other specified counseling: Secondary | ICD-10-CM

## 2018-04-18 DIAGNOSIS — E785 Hyperlipidemia, unspecified: Secondary | ICD-10-CM

## 2018-04-18 DIAGNOSIS — Z0000 Encounter for general adult medical examination without abnormal findings: Secondary | ICD-10-CM

## 2018-04-18 DIAGNOSIS — E1140 Type 2 diabetes mellitus with diabetic neuropathy, unspecified: Secondary | ICD-10-CM

## 2018-04-18 DIAGNOSIS — N138 Other obstructive and reflux uropathy: Secondary | ICD-10-CM

## 2018-04-18 DIAGNOSIS — Z1211 Encounter for screening for malignant neoplasm of colon: Secondary | ICD-10-CM

## 2018-04-18 LAB — LIPID PANEL
CHOL/HDL RATIO: 4
Cholesterol: 139 mg/dL (ref 0–200)
HDL: 31.3 mg/dL — ABNORMAL LOW (ref >39.00)
LDL CALC: 90 mg/dL (ref 0–99)
NONHDL: 107.76
Triglycerides: 89 mg/dL (ref 0.0–149.0)
VLDL: 17.8 mg/dL (ref 0.0–40.0)

## 2018-04-18 LAB — CBC
HCT: 42.6 % (ref 39.0–52.0)
Hemoglobin: 14.3 g/dL (ref 13.0–17.0)
MCHC: 33.5 g/dL (ref 30.0–36.0)
MCV: 88.2 fl (ref 78.0–100.0)
Platelets: 169 10*3/uL (ref 150.0–400.0)
RBC: 4.83 Mil/uL (ref 4.22–5.81)
RDW: 14 % (ref 11.5–15.5)
WBC: 6.6 10*3/uL (ref 4.0–10.5)

## 2018-04-18 LAB — COMPREHENSIVE METABOLIC PANEL
ALBUMIN: 4.2 g/dL (ref 3.5–5.2)
ALT: 11 U/L (ref 0–53)
AST: 14 U/L (ref 0–37)
Alkaline Phosphatase: 43 U/L (ref 39–117)
BUN: 15 mg/dL (ref 6–23)
CHLORIDE: 103 meq/L (ref 96–112)
CO2: 30 meq/L (ref 19–32)
CREATININE: 1.06 mg/dL (ref 0.40–1.50)
Calcium: 9.5 mg/dL (ref 8.4–10.5)
GFR: 71.6 mL/min (ref >60.00)
GLUCOSE: 109 mg/dL — AB (ref 70–99)
Potassium: 4.3 mEq/L (ref 3.5–5.1)
SODIUM: 140 meq/L (ref 135–145)
Total Bilirubin: 0.5 mg/dL (ref 0.2–1.2)
Total Protein: 7.6 g/dL (ref 6.0–8.3)

## 2018-04-18 LAB — HEMOGLOBIN A1C: HEMOGLOBIN A1C: 6.7 % — AB (ref 4.6–6.5)

## 2018-04-18 LAB — HM DIABETES FOOT EXAM

## 2018-04-18 NOTE — Assessment & Plan Note (Signed)
Some issues mostly at night  No changes needed

## 2018-04-18 NOTE — Progress Notes (Signed)
Subjective:    Patient ID: Frank Carlson, male    DOB: 1941-01-31, 77 y.o.   MRN: [id number]  HPI Here for Medicare wellness visit and follow up of chronic health conditions Reviewed form and advanced directives Reviewed other doctors No alcohol or tobacco Exercises regularly No falls No depression or anhedonia Vision is okay Has hearing aides--help a lot Independent with instrumental ADLs No sig memory issues  Checks sugars once a month Stays the same-- 98-110 Mild foot numbness--no sig pain Recent eye exam  Has left thumb pain Only if he flexes it fully--points to [facility] Not bad enough to need Rx  Continues on the statin No myalgias or GI problems  Voids okay in day At night--it is very slow. Nocturia x 1 though  Current Outpatient Medications on File Prior to Visit  Medication Sig Dispense Refill  . Alcohol Swabs PADS 1 Package by Does not apply route as needed. 100 each 3  . Calcium Carb-Ergocalciferol (CHEWABLE CALCIUM/D PO) Take 1 tablet by mouth.    Marland Kitchen glucose blood (TRUE METRIX BLOOD GLUCOSE TEST) test strip Use to test blood sugar once a day. Dx Code: E08.40 (Patient taking differently: Use to test blood sugar twice a month  Dx Code: E08.40) 100 each 3  . metFORMIN (GLUCOPHAGE) 500 MG tablet Take 1 tablet (500 mg total) by mouth daily with breakfast. 90 tablet 3  . Multiple Vitamin (MULTIVITAMIN WITH MINERALS) TABS tablet Take 1 tablet by mouth daily.    . Omega-3 Fatty Acids (FISH OIL) 1200 MG CAPS Take 1 capsule by mouth daily.    . simvastatin (ZOCOR) 20 MG tablet TAKE 1 TABLET (20 MG TOTAL) BY MOUTH AT BEDTIME. 90 tablet 3  . TRUEPLUS LANCETS 30G MISC 1 Units by Does not apply route daily. Use to check blood sugar once a day. Dx Code T51.76 (Patient taking differently: 1 Units by Does not apply route. Use to check blood sugar twice a month. Dx Code E08.40) 100 each 3   No current facility-administered medications on file prior to visit.     Allergies    Allergen Reactions  . Bee Venom     Passed out    Past Medical History:  Diagnosis Date  . BPH (benign prostatic hypertrophy)   . COLONIC POLYPS, HX OF 03/15/2008  . 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 CONCUSSION WITH LOC OF 30 MINUTES OR LESS 11/01/2009  . ED (erectile dysfunction)   . HEARING LOSS, BILATERAL 11/01/2009   Wears hearing aids  . Hyperlipidemia   . JOINT STIFFNESS, HAND 10/28/2008  . Type II or unspecified type diabetes mellitus with neurological manifestations, not stated as uncontrolled(250.60) 1/15   Type 2    Past Surgical History:  Procedure Laterality Date  . CATARACT EXTRACTION Bilateral 07/2010   OD with IOL  . COLONOSCOPY W/ POLYPECTOMY    . INCISION / DRAINAGE HAND / FINGER Right   . INGUINAL HERNIA REPAIR Right 08/23/2015   Procedure: LAPAROSCOPIC RIGHT INGUINAL HERNIA REPAIR WITH MESH;  Surgeon: Ralene Ok, MD;  Location: [place];  Service: General;  Laterality: Right;  . INSERTION OF MESH Right 08/23/2015   Procedure: INSERTION OF MESH;  Surgeon: Ralene Ok, MD;  Location: [place];  Service: General;  Laterality: Right;  . TONSILLECTOMY    . VASECTOMY      Family History  Problem Relation Age of Onset  . Alzheimer's disease Mother   . Dementia Mother   . Coronary artery disease Father   .  Heart disease Father   . Coronary artery disease Other   . Diabetes Other   . Alzheimer's disease Sister   . Dementia Brother   . Alzheimer's disease Brother     Social History   Socioeconomic History  . Marital status: Married    Spouse name: Lovey Newcomer  . Number of children: 3  . Years of education: 7  . Highest education level: Not on file  Occupational History  . Occupation: Therapist, occupational    Comment: Retired  Scientific laboratory technician  . Financial resource strain: Not on file  . Food insecurity:    Worry: Not on file    Inability: Not on file  . Transportation needs:    Medical: Not on file    Non-medical: Not on file  Tobacco Use  .  Smoking status: Former Smoker    Packs/day: 2.00    Years: 5.00    Pack years: 10.00    Types: Cigarettes    Last attempt to quit: 11/25/1958    Years since quitting: 59.4  . Smokeless tobacco: Never Used  Substance and Sexual Activity  . Alcohol use: No  . Drug use: No  . Sexual activity: Yes    Partners: Female  Lifestyle  . Physical activity:    Days per week: Not on file    Minutes per session: Not on file  . Stress: Not on file  Relationships  . Social connections:    Talks on phone: Not on file    Gets together: Not on file    Attends religious service: Not on file    Active member of club or organization: Not on file    Attends meetings of clubs or organizations: Not on file    Relationship status: Not on file  . Intimate partner violence:    Fear of current or ex partner: Not on file    Emotionally abused: Not on file    Physically abused: Not on file    Forced sexual activity: Not on file  Other Topics Concern  . Not on file  Social History Narrative   [employer]- Estate manager/land agent.    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    Married '64. 3 sons- '66, '68, '71; 5 grandchildren, 1 step g-dtr.       Has living will   Wife is health care POA--then son Mitzi Hansen   Would accept resuscitation attempts but no prolonged ventilatory support   Probably wouldn't want prolonged tube feeds   Review of Systems Appetite is fine Weight is stable Sleeps okay in general Wars seat belt Teeth okay ---keeps up with dentist Bowels are moving well. 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 He appears well-developed. No distress.  HENT:  Mouth/Throat: Oropharynx is clear and moist. No oropharyngeal exudate.  Neck: No thyromegaly present.  Cardiovascular: Normal  rate, regular rhythm, normal heart sounds and intact distal pulses. Exam reveals no gallop.  No murmur heard. Respiratory: Effort normal and breath sounds normal. No respiratory distress. He has no wheezes. He has no rales.  GI: Soft. There is no tenderness.  Musculoskeletal: He exhibits no edema or tenderness.  Lymphadenopathy:    He has no cervical adenopathy.  Neurological: He is alert and oriented to person, place, and time.  President--- "Milinda Pointer, Obama,  Bush" [id number] D-l-r-o-w Recall 3/3  Fairly normal sensation in plantar feet  Skin:  No foot lesions  Psychiatric: He has a normal mood and affect. His behavior is normal.           Assessment & Plan:

## 2018-04-18 NOTE — Assessment & Plan Note (Signed)
See social history 

## 2018-04-18 NOTE — Progress Notes (Signed)
Hearing Screening Comments: Wears Hearing Aids Vision Screening Comments: May 2019

## 2018-04-18 NOTE — Assessment & Plan Note (Signed)
I have personally reviewed the Medicare Annual Wellness questionnaire and have noted 1. The patient's medical and social history 2. Their use of alcohol, tobacco or illicit drugs 3. Their current medications and supplements 4. The patient's functional ability including ADL's, fall risks, home safety risks and hearing or visual             impairment. 5. Diet and physical activities 6. Evidence for depression or mood disorders  The patients weight, height, BMI and visual acuity have been recorded in the chart I have made referrals, counseling and provided education to the patient based review of the above and I have provided the pt with a written personalized care plan for preventive services.  I have provided you with a copy of your personalized plan for preventive services. Please take the time to review along with your updated medication list.  Yearly flu vaccine Stays fit Will do FIT again No PSA due to age

## 2018-04-18 NOTE — Assessment & Plan Note (Signed)
No problems with statin 

## 2018-04-18 NOTE — Assessment & Plan Note (Signed)
Seems to still have excellent control Mild numbness in feet--nothing major Will check labs

## 2018-04-25 ENCOUNTER — Other Ambulatory Visit (INDEPENDENT_AMBULATORY_CARE_PROVIDER_SITE_OTHER): Payer: Medicare HMO

## 2018-04-25 DIAGNOSIS — Z1211 Encounter for screening for malignant neoplasm of colon: Secondary | ICD-10-CM

## 2018-04-25 LAB — FECAL OCCULT BLOOD, IMMUNOCHEMICAL: Fecal Occult Bld: NEGATIVE

## 2018-07-02 ENCOUNTER — Other Ambulatory Visit: Payer: Self-pay | Admitting: Internal Medicine

## 2018-08-21 ENCOUNTER — Other Ambulatory Visit: Payer: Self-pay | Admitting: Internal Medicine

## 2018-08-21 DIAGNOSIS — E7800 Pure hypercholesterolemia, unspecified: Secondary | ICD-10-CM

## 2018-09-01 ENCOUNTER — Other Ambulatory Visit: Payer: Self-pay | Admitting: Dermatology

## 2018-09-01 DIAGNOSIS — D485 Neoplasm of uncertain behavior of skin: Secondary | ICD-10-CM | POA: Diagnosis not present

## 2018-09-01 DIAGNOSIS — L821 Other seborrheic keratosis: Secondary | ICD-10-CM | POA: Diagnosis not present

## 2018-09-01 DIAGNOSIS — L820 Inflamed seborrheic keratosis: Secondary | ICD-10-CM | POA: Diagnosis not present

## 2018-10-21 ENCOUNTER — Encounter: Payer: Self-pay | Admitting: Internal Medicine

## 2018-10-21 ENCOUNTER — Ambulatory Visit (INDEPENDENT_AMBULATORY_CARE_PROVIDER_SITE_OTHER): Payer: Medicare HMO | Admitting: Internal Medicine

## 2018-10-21 VITALS — BP 102/74 | HR 57 | Temp 98.1°F | Ht 71.0 in | Wt 195.0 lb

## 2018-10-21 DIAGNOSIS — E0840 Diabetes mellitus due to underlying condition with diabetic neuropathy, unspecified: Secondary | ICD-10-CM | POA: Diagnosis not present

## 2018-10-21 DIAGNOSIS — E1140 Type 2 diabetes mellitus with diabetic neuropathy, unspecified: Secondary | ICD-10-CM

## 2018-10-21 LAB — POCT GLYCOSYLATED HEMOGLOBIN (HGB A1C): Hemoglobin A1C: 5.9 % — AB (ref 4.0–5.6)

## 2018-10-21 NOTE — Assessment & Plan Note (Addendum)
Seems to have good control Mild neuropathy--no Rx needed Lab Results  Component Value Date   HGBA1C 5.9 (A) 10/21/2018   Great job!

## 2018-10-21 NOTE — Progress Notes (Signed)
Subjective:    Patient ID: Frank Carlson, male    DOB: 09-Nov-1941, 77 y.o.   MRN: [id number]  HPI Here for follow up of diabetes Concerned about his height loss so working on being more healthy Lost 13# more weight--same as in high school!!  Sugars have varied a lot more 95-120  Checks monthly Some tingling in feet  Still goes to gym 5 days per week Walks and other resistance exercises  Current Outpatient Medications on File Prior to Visit  Medication Sig Dispense Refill  . Alcohol Swabs PADS 1 Package by Does not apply route as needed. 100 each 3  . Calcium Carb-Ergocalciferol (CHEWABLE CALCIUM/D PO) Take 1 tablet by mouth.    Marland Kitchen glucose blood (TRUE METRIX BLOOD GLUCOSE TEST) test strip Use to test blood sugar once a day. Dx Code: E08.40 (Patient taking differently: Use to test blood sugar twice a month  Dx Code: E08.40) 100 each 3  . metFORMIN (GLUCOPHAGE) 500 MG tablet TAKE 1 TABLET EVERY DAY WITH BREAKFAST 90 tablet 3  . Multiple Vitamin (MULTIVITAMIN WITH MINERALS) TABS tablet Take 1 tablet by mouth daily.    . Omega-3 Fatty Acids (FISH OIL) 1200 MG CAPS Take 1 capsule by mouth daily.    . simvastatin (ZOCOR) 20 MG tablet TAKE 1 TABLET (20 MG TOTAL) BY MOUTH AT BEDTIME. 90 tablet 3  . TRUEPLUS LANCETS 30G MISC 1 Units by Does not apply route daily. Use to check blood sugar once a day. Dx Code N27.78 (Patient taking differently: 1 Units by Does not apply route. Use to check blood sugar twice a month. Dx Code E08.40) 100 each 3   No current facility-administered medications on file prior to visit.     Allergies  Allergen Reactions  . Bee Venom     Passed out    Past Medical History:  Diagnosis Date  . BPH (benign prostatic hypertrophy)   . COLONIC POLYPS, HX OF 03/15/2008  . 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 CONCUSSION WITH LOC OF 30 MINUTES OR LESS 11/01/2009  . ED (erectile dysfunction)   . HEARING LOSS, BILATERAL 11/01/2009   Wears hearing aids  . Hyperlipidemia   .  JOINT STIFFNESS, HAND 10/28/2008  . Type II or unspecified type diabetes mellitus with neurological manifestations, not stated as uncontrolled(250.60) 1/15   Type 2    Past Surgical History:  Procedure Laterality Date  . CATARACT EXTRACTION Bilateral 07/2010   OD with IOL  . COLONOSCOPY W/ POLYPECTOMY    . INCISION / DRAINAGE HAND / FINGER Right   . INGUINAL HERNIA REPAIR Right 08/23/2015   Procedure: LAPAROSCOPIC RIGHT INGUINAL HERNIA REPAIR WITH MESH;  Surgeon: Ralene Ok, MD;  Location: [place];  Service: General;  Laterality: Right;  . INSERTION OF MESH Right 08/23/2015   Procedure: INSERTION OF MESH;  Surgeon: Ralene Ok, MD;  Location: [place];  Service: General;  Laterality: Right;  . TONSILLECTOMY    . VASECTOMY      Family History  Problem Relation Age of Onset  . Alzheimer's disease Mother   . Dementia Mother   . Coronary artery disease Father   . Heart disease Father   . Coronary artery disease Other   . Diabetes Other   . Alzheimer's disease Sister   . Dementia Brother   . Alzheimer's disease Brother     Social History   Socioeconomic History  . Marital status: Married    Spouse name: Lovey Newcomer  . Number of children: 3  .  Years of education: 70  . Highest education level: Not on file  Occupational History  . Occupation: Therapist, occupational    Comment: Retired  Scientific laboratory technician  . Financial resource strain: Not on file  . Food insecurity:    Worry: Not on file    Inability: Not on file  . Transportation needs:    Medical: Not on file    Non-medical: Not on file  Tobacco Use  . Smoking status: Former Smoker    Packs/day: 2.00    Years: 5.00    Pack years: 10.00    Types: Cigarettes    Last attempt to quit: 11/25/1958    Years since quitting: 59.9  . Smokeless tobacco: Never Used  Substance and Sexual Activity  . Alcohol use: No  . Drug use: No  . Sexual activity: Yes    Partners: Female  Lifestyle  . Physical activity:    Days per  week: Not on file    Minutes per session: Not on file  . Stress: Not on file  Relationships  . Social connections:    Talks on phone: Not on file    Gets together: Not on file    Attends religious service: Not on file    Active member of club or organization: Not on file    Attends meetings of clubs or organizations: Not on file    Relationship status: Not on file  . Intimate partner violence:    Fear of current or ex partner: Not on file    Emotionally abused: Not on file    Physically abused: Not on file    Forced sexual activity: Not on file  Other Topics Concern  . Not on file  Social History Narrative   [employer]- Estate manager/land agent.    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    Married '64. 3 sons- '66, '68, '71; 5 grandchildren, 1 step g-dtr.       Has living will   Wife is health care POA--then son Mitzi Hansen   Would accept resuscitation attempts but no prolonged ventilatory support   Probably wouldn't want prolonged tube feeds   Review of Systems  No chest pain No SOB Sleeps okay---nocturia x 1 is stable     Objective:   Physical Exam  Constitutional: He appears well-developed. No distress.  Neck: No thyromegaly present.  Cardiovascular: Normal rate, regular rhythm, normal heart sounds and intact distal pulses. Exam reveals no gallop.  No murmur heard. Respiratory: Effort normal and breath sounds normal. No respiratory distress. He has no wheezes. He has no rales.  Musculoskeletal: He exhibits no edema.  Lymphadenopathy:    He has no cervical adenopathy.  Skin:  No foot lesions  Psychiatric: He has a normal mood and affect. His behavior is normal.           Assessment & Plan:

## 2019-04-13 DIAGNOSIS — Z0100 Encounter for examination of eyes and vision without abnormal findings: Secondary | ICD-10-CM | POA: Diagnosis not present

## 2019-04-13 DIAGNOSIS — E119 Type 2 diabetes mellitus without complications: Secondary | ICD-10-CM | POA: Diagnosis not present

## 2019-04-13 LAB — HM DIABETES EYE EXAM

## 2019-04-24 ENCOUNTER — Telehealth: Payer: Self-pay | Admitting: Internal Medicine

## 2019-04-24 NOTE — Telephone Encounter (Signed)
I left a message on patient's v.m. to return my call.  Patient needs to change his AWV on 04/27/19 to a virtual office visit, if possible.

## 2019-04-27 ENCOUNTER — Ambulatory Visit (INDEPENDENT_AMBULATORY_CARE_PROVIDER_SITE_OTHER): Payer: Medicare HMO | Admitting: Internal Medicine

## 2019-04-27 ENCOUNTER — Encounter: Payer: Self-pay | Admitting: Internal Medicine

## 2019-04-27 VITALS — BP 120/73 | HR 60 | Wt 183.0 lb

## 2019-04-27 DIAGNOSIS — E0840 Diabetes mellitus due to underlying condition with diabetic neuropathy, unspecified: Secondary | ICD-10-CM | POA: Diagnosis not present

## 2019-04-27 DIAGNOSIS — Z7189 Other specified counseling: Secondary | ICD-10-CM | POA: Diagnosis not present

## 2019-04-27 DIAGNOSIS — N138 Other obstructive and reflux uropathy: Secondary | ICD-10-CM | POA: Diagnosis not present

## 2019-04-27 DIAGNOSIS — E785 Hyperlipidemia, unspecified: Secondary | ICD-10-CM

## 2019-04-27 DIAGNOSIS — N401 Enlarged prostate with lower urinary tract symptoms: Secondary | ICD-10-CM

## 2019-04-27 DIAGNOSIS — Z0000 Encounter for general adult medical examination without abnormal findings: Secondary | ICD-10-CM | POA: Diagnosis not present

## 2019-04-27 NOTE — Assessment & Plan Note (Signed)
I have personally reviewed the Medicare Annual Wellness questionnaire and have noted 1. The patient's medical and social history 2. Their use of alcohol, tobacco or illicit drugs 3. Their current medications and supplements 4. The patient's functional ability including ADL's, fall risks, home safety risks and hearing or visual             impairment. 5. Diet and physical activities 6. Evidence for depression or mood disorders  The patients weight, height, BMI and visual acuity have been recorded in the chart I have made referrals, counseling and provided education to the patient based review of the above and I have provided the pt with a written personalized care plan for preventive services.  I have provided you with a copy of your personalized plan for preventive services. Please take the time to review along with your updated medication list.  Flu vaccine in the fall No cancer screening due to age Due for Td---will come in if he gets an injury Doing a great job with exercise/proper eating

## 2019-04-27 NOTE — Assessment & Plan Note (Signed)
Great control  Very slight tingling only--no Rx needed

## 2019-04-27 NOTE — Assessment & Plan Note (Signed)
No problems with statin 

## 2019-04-27 NOTE — Progress Notes (Signed)
Subjective:    Patient ID: Frank Carlson, male    DOB: [birth year], 78 y.o.   MRN: [id number]  HPI Virtual visit for Medicare wellness and follow up of chronic health conditions Identification done Reviewed billing and he gave consent He is at home and I am in my office  No surgery or hospitalizations  No falls No depression or anhedonia No tobacco or alcohol Vision is fine--getting new glasses Hearing aides for about 5 years--they work okay Other doctors---- [employer] (hearing aides), [facility], dermatologist--Dr Pamala Duffel (no recent visits), dentist-[facility]  Has been eating fairly well Exercises daily--- walks 5 miles and some weight training Checks sugars monthly---usually around 100 No numbness or pain in feet---does just have some mild tingling (not persistent)  Voids fine Nocturia once most nights.  Flow is okay---generally no dribbling  Still on statin No myalgia/GI problems  Current Outpatient Medications on File Prior to Visit  Medication Sig Dispense Refill  . Alcohol Swabs PADS 1 Package by Does not apply route as needed. 100 each 3  . Calcium Carb-Ergocalciferol (CHEWABLE CALCIUM/D PO) Take 1 tablet by mouth.    Marland Kitchen glucose blood (TRUE METRIX BLOOD GLUCOSE TEST) test strip Use to test blood sugar once a day. Dx Code: E08.40 (Patient taking differently: Use to test blood sugar twice a month  Dx Code: E08.40) 100 each 3  . metFORMIN (GLUCOPHAGE) 500 MG tablet TAKE 1 TABLET EVERY DAY WITH BREAKFAST 90 tablet 3  . Multiple Vitamin (MULTIVITAMIN WITH MINERALS) TABS tablet Take 1 tablet by mouth daily.    . Omega-3 Fatty Acids (FISH OIL) 1200 MG CAPS Take 1 capsule by mouth daily.    . simvastatin (ZOCOR) 20 MG tablet TAKE 1 TABLET (20 MG TOTAL) BY MOUTH AT BEDTIME. 90 tablet 3  . TRUEPLUS LANCETS 30G MISC 1 Units by Does not apply route daily. Use to check blood sugar once a day. Dx Code T59.74 (Patient taking differently: 1 Units by Does not apply route. Use  to check blood sugar twice a month. Dx Code E08.40) 100 each 3   No current facility-administered medications on file prior to visit.     Allergies  Allergen Reactions  . Bee Venom     Passed out    Past Medical History:  Diagnosis Date  . BPH (benign prostatic hypertrophy)   . COLONIC POLYPS, HX OF 03/15/2008  . 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 CONCUSSION WITH LOC OF 30 MINUTES OR LESS 11/01/2009  . ED (erectile dysfunction)   . HEARING LOSS, BILATERAL 11/01/2009   Wears hearing aids  . Hyperlipidemia   . JOINT STIFFNESS, HAND 10/28/2008  . Type II or unspecified type diabetes mellitus with neurological manifestations, not stated as uncontrolled(250.60) 1/15   Type 2    Past Surgical History:  Procedure Laterality Date  . CATARACT EXTRACTION Bilateral 07/2010   OD with IOL  . COLONOSCOPY W/ POLYPECTOMY    . INCISION / DRAINAGE HAND / FINGER Right   . INGUINAL HERNIA REPAIR Right 08/23/2015   Procedure: LAPAROSCOPIC RIGHT INGUINAL HERNIA REPAIR WITH MESH;  Surgeon: Ralene Ok, MD;  Location: [facility] [facility];  Service: General;  Laterality: Right;  . INSERTION OF MESH Right 08/23/2015   Procedure: INSERTION OF MESH;  Surgeon: Ralene Ok, MD;  Location: [facility];  Service: General;  Laterality: Right;  . TONSILLECTOMY    . VASECTOMY      Family History  Problem Relation Age of Onset  . Alzheimer's disease Mother   .  Dementia Mother   . Coronary artery disease Father   . Heart disease Father   . Coronary artery disease Other   . Diabetes Other   . Alzheimer's disease Sister   . Dementia Brother   . Alzheimer's disease Brother     Social History   Socioeconomic History  . Marital status: Married    Spouse name: Lovey Newcomer  . Number of children: 3  . Years of education: 54  . Highest education level: Not on file  Occupational History  . Occupation: Therapist, occupational    Comment: Retired  Scientific laboratory technician  . Financial resource strain: Not on file  . Food insecurity     Worry: Not on file    Inability: Not on file  . Transportation needs    Medical: Not on file    Non-medical: Not on file  Tobacco Use  . Smoking status: Former Smoker    Packs/day: 2.00    Years: 5.00    Pack years: 10.00    Types: Cigarettes    Quit date: 11/25/1958    Years since quitting: 60.4  . Smokeless tobacco: Never Used  Substance and Sexual Activity  . Alcohol use: No  . Drug use: No  . Sexual activity: Yes    Partners: Female  Lifestyle  . Physical activity    Days per week: Not on file    Minutes per session: Not on file  . Stress: Not on file  Relationships  . Social Herbalist on phone: Not on file    Gets together: Not on file    Attends religious service: Not on file    Active member of club or organization: Not on file    Attends meetings of clubs or organizations: Not on file    Relationship status: Not on file  . Intimate partner violence    Fear of current or ex partner: Not on file    Emotionally abused: Not on file    Physically abused: Not on file    Forced sexual activity: Not on file  Other Topics Concern  . Not on file  Social History Narrative   [employer]- Estate manager/land agent.    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    Married '64. 3 sons- '66, '68, '71; 5 grandchildren, 1 step g-dtr.       Has living will   Wife is health care POA--then son Mitzi Hansen   Would accept resuscitation attempts but no prolonged ventilatory support   Probably wouldn't want prolonged tube feeds    Review of Systems No suspicious skin lesions now Teeth are fine---keeps up with dentist Appetite is good Weight is down ~25# in the past year Sleeps well Wears seat belt Bowels are fine--no blood No heartburn or dysphagia No chest pain or palpitations No SOB No dizziness or syncope    Objective:   Physical Exam  Constitutional: He appears well-developed. No distress.  Respiratory: Effort normal. No respiratory distress.   Musculoskeletal:        General: No edema.  Neurological:  President--- "Daisy Floro, Barack Obama, Bush" [phone] [id number] Recall 3/3  Skin:  No foot lesions  Psychiatric: He has a normal mood and affect. His behavior is normal.           Assessment & Plan:

## 2019-04-27 NOTE — Assessment & Plan Note (Signed)
Mild symptoms ?No Rx needed ?

## 2019-04-27 NOTE — Assessment & Plan Note (Signed)
See social history 

## 2019-04-28 ENCOUNTER — Other Ambulatory Visit (INDEPENDENT_AMBULATORY_CARE_PROVIDER_SITE_OTHER): Payer: Medicare HMO

## 2019-04-28 ENCOUNTER — Other Ambulatory Visit: Payer: Self-pay

## 2019-04-28 DIAGNOSIS — E119 Type 2 diabetes mellitus without complications: Secondary | ICD-10-CM

## 2019-04-28 DIAGNOSIS — E0840 Diabetes mellitus due to underlying condition with diabetic neuropathy, unspecified: Secondary | ICD-10-CM

## 2019-04-28 LAB — COMPREHENSIVE METABOLIC PANEL
ALT: 15 U/L (ref 0–53)
AST: 14 U/L (ref 0–37)
Albumin: 4 g/dL (ref 3.5–5.2)
Alkaline Phosphatase: 43 U/L (ref 39–117)
BUN: 18 mg/dL (ref 6–23)
CO2: 32 mEq/L (ref 19–32)
Calcium: 9.2 mg/dL (ref 8.4–10.5)
Chloride: 104 mEq/L (ref 96–112)
Creatinine, Ser: 0.99 mg/dL (ref 0.40–1.50)
GFR: 72.71 mL/min (ref >60.00)
Glucose, Bld: 109 mg/dL — ABNORMAL HIGH (ref 70–99)
Potassium: 4.4 mEq/L (ref 3.5–5.1)
Sodium: 141 mEq/L (ref 135–145)
Total Bilirubin: 0.6 mg/dL (ref 0.2–1.2)
Total Protein: 6.8 g/dL (ref 6.0–8.3)

## 2019-04-28 LAB — CBC
HCT: 40.5 % (ref 39.0–52.0)
Hemoglobin: 13.5 g/dL (ref 13.0–17.0)
MCHC: 33.2 g/dL (ref 30.0–36.0)
MCV: 88.8 fl (ref 78.0–100.0)
Platelets: 163 10*3/uL (ref 150.0–400.0)
RBC: 4.56 Mil/uL (ref 4.22–5.81)
RDW: 14.4 % (ref 11.5–15.5)
WBC: 4.6 10*3/uL (ref 4.0–10.5)

## 2019-04-28 LAB — LIPID PANEL
Cholesterol: 130 mg/dL (ref 0–200)
HDL: 28.8 mg/dL — ABNORMAL LOW (ref >39.00)
LDL Cholesterol: 80 mg/dL (ref 0–99)
NonHDL: 101.18
Total CHOL/HDL Ratio: 5
Triglycerides: 104 mg/dL (ref 0.0–149.0)
VLDL: 20.8 mg/dL (ref 0.0–40.0)

## 2019-04-28 LAB — HEMOGLOBIN A1C: Hgb A1c MFr Bld: 6.5 % (ref 4.6–6.5)

## 2019-05-18 ENCOUNTER — Other Ambulatory Visit: Payer: Self-pay | Admitting: Internal Medicine

## 2019-06-19 ENCOUNTER — Other Ambulatory Visit: Payer: Self-pay | Admitting: Internal Medicine

## 2019-06-19 DIAGNOSIS — E7800 Pure hypercholesterolemia, unspecified: Secondary | ICD-10-CM

## 2019-07-07 ENCOUNTER — Other Ambulatory Visit: Payer: Self-pay

## 2019-07-07 ENCOUNTER — Ambulatory Visit (INDEPENDENT_AMBULATORY_CARE_PROVIDER_SITE_OTHER): Payer: Medicare HMO

## 2019-07-07 DIAGNOSIS — Z23 Encounter for immunization: Secondary | ICD-10-CM

## 2019-08-24 ENCOUNTER — Encounter: Payer: Self-pay | Admitting: Family Medicine

## 2019-08-24 ENCOUNTER — Telehealth: Payer: Self-pay

## 2019-08-24 ENCOUNTER — Ambulatory Visit (INDEPENDENT_AMBULATORY_CARE_PROVIDER_SITE_OTHER): Payer: Medicare HMO | Admitting: Family Medicine

## 2019-08-24 VITALS — BP 132/106 | HR 59 | Ht 71.0 in

## 2019-08-24 DIAGNOSIS — J189 Pneumonia, unspecified organism: Secondary | ICD-10-CM

## 2019-08-24 DIAGNOSIS — J3489 Other specified disorders of nose and nasal sinuses: Secondary | ICD-10-CM | POA: Diagnosis not present

## 2019-08-24 DIAGNOSIS — R059 Cough, unspecified: Secondary | ICD-10-CM

## 2019-08-24 DIAGNOSIS — R05 Cough: Secondary | ICD-10-CM | POA: Diagnosis not present

## 2019-08-24 MED ORDER — DOXYCYCLINE HYCLATE 100 MG PO TABS: 100.0000 mg | ORAL_TABLET | Freq: Two times a day (BID) | ORAL | 0 refills | Status: AC

## 2019-08-24 NOTE — Progress Notes (Signed)
Frank Eyman T. Tallyn Holroyd, MD [facility] [place] [facility] [place], [address] Phone: [phone]  FAX: [phone]  [name] - 78 y.o. male  MRN [id number]  Date of Birth: 04/16/1941  Visit Date: 08/24/2019  PCP: Venia Carbon, MD  Referred by: Venia Carbon, MD Chief Complaint  Patient presents with  . Cough    worse at night -chest muscle hurts from coughing so much  . Sinus Drainage   Virtual Visit via Video Note:  I connected with  Frank Carlson on 08/24/2019  9:40 AM EDT by a video enabled telemedicine application and verified that I am speaking with the correct person using two identifiers.   Location patient: home computer, tablet, or smartphone Location provider: work or home office Consent: Verbal consent directly obtained from Frank Carlson. Persons participating in the virtual visit: patient, provider  I discussed the limitations of evaluation and management by telemedicine and the availability of in person appointments. The patient expressed understanding and agreed to proceed.  Interactive audio and video telecommunications were attempted between this provider and patient, however failed, due to patient having technical difficulties OR patient did not have access to video capability.  We continued and completed visit with audio only.   History of Present Illness:  2 weeks ago.  Normal home rx, ? Hay fever.  Has an awful lot of drainage.  No fever.  otc remedies.  He has tried multiple home remedies including Mucinex and others without much significant relief of the symptoms.  He does not have any fever.  Initially thought that this was likely just allergies, this is progressed over time.  His cough is deep and, and he is producing a significant amount of sputum.  He is only minimally short of breath.  He denies any nausea, vomiting, diarrhea, neurological change or changes in his  stent or taste.  troublr breathing.  Checked for covid.  Twice.    Immunization History  Administered Date(s) Administered  . Influenza Split 08/19/2012  . Influenza Whole 08/17/2008, 09/12/2009, 08/10/2010  . Influenza, High Dose Seasonal PF 08/03/2017, 08/14/2018  . Influenza,inj,Quad PF,6+ Mos 07/14/2013, 08/02/2014, 08/02/2015, 07/20/2016, 07/07/2019  . Pneumococcal Conjugate-13 11/30/2013  . Pneumococcal Polysaccharide-23 11/01/2009, 04/17/2017  . Td 11/01/2009  . Zoster 11/14/2009     Review of Systems as above: See pertinent positives and pertinent negatives per HPI No acute distress verbally  Past Medical History, Surgical History, Social History, Family History, Problem List, Medications, and Allergies have been reviewed and updated if relevant.   Observations/Objective/Exam:  An attempt was made to discern vital signs over the phone and per patient if applicable and possible.   General:    Alert, Oriented, appears well and in no acute distress HEENT:     Atraumatic, conjunctiva clear, no obvious abnormalities on inspection of external nose and ears.  Neck:    Normal movements of the head and neck Pulmonary:     On inspection no signs of respiratory distress, breathing rate appears normal, no obvious gross SOB, gasping or wheezing Cardiovascular:    No obvious cyanosis Musculoskeletal:    Moves all visible extremities without noticeable abnormality Psych / Neurological:     Pleasant and cooperative, no obvious depression or anxiety, speech and thought processing grossly intact  Assessment and Plan:    ICD-10-CM   1. Atypical pneumonia  J18.9   2. Cough  R05   3. Sinus  drainage  J34.89    Most likely atypical pneumonia based on clinical history of 2 weeks.  Treat as such.  Discussed the possibility of him getting a COVID-19 test, and he plans on doing this.  I discussed the assessment and treatment plan with the patient. The patient was provided an  opportunity to ask questions and all were answered. The patient agreed with the plan and demonstrated an understanding of the instructions.   The patient was advised to call back or seek an in-person evaluation if the symptoms worsen or if the condition fails to improve as anticipated.  Follow-up: prn unless noted otherwise below No follow-ups on file.  Meds ordered this encounter  Medications  . doxycycline (VIBRA-TABS) 100 MG tablet    Sig: Take 1 tablet (100 mg total) by mouth 2 (two) times daily for 10 days.    Dispense:  20 tablet    Refill:  0   No orders of the defined types were placed in this encounter.   Signed,  Frank Carlson. Frank Malinowski, MD

## 2019-08-24 NOTE — Telephone Encounter (Signed)
[place] Night - Client Nonclinical Telephone Record [facility] Client [place] Night - Client Client Site [place] Physician Viviana Simpler - MD Contact Type Call Who Is Calling Patient / Member / Family / Caregiver Caller Name Linnell Luedeman Caller Phone Number [phone] Patient Name Frank Carlson Patient DOB [birth year] Call Type Message Only Information Provided Reason for Call Request to Schedule Office Appointment Initial Comment Would like to make an appt. Additional Comment Call Closed By: Mauri Pole Transaction Date/Time: 08/24/2019 7:37:13 AM (ET)

## 2019-08-24 NOTE — Telephone Encounter (Signed)
Per chart review tab pt has already had virtual appt with Dr Lorelei Pont today.

## 2019-10-05 ENCOUNTER — Encounter: Payer: Self-pay | Admitting: Internal Medicine

## 2019-10-05 ENCOUNTER — Ambulatory Visit (INDEPENDENT_AMBULATORY_CARE_PROVIDER_SITE_OTHER): Payer: Medicare HMO | Admitting: Internal Medicine

## 2019-10-05 ENCOUNTER — Telehealth: Payer: Self-pay

## 2019-10-05 DIAGNOSIS — E1149 Type 2 diabetes mellitus with other diabetic neurological complication: Secondary | ICD-10-CM | POA: Diagnosis not present

## 2019-10-05 DIAGNOSIS — E0840 Diabetes mellitus due to underlying condition with diabetic neuropathy, unspecified: Secondary | ICD-10-CM

## 2019-10-05 MED ORDER — METFORMIN HCL 500 MG PO TABS: 500.0000 mg | ORAL_TABLET | Freq: Two times a day (BID) | ORAL | 3 refills | Status: DC

## 2019-10-05 NOTE — Progress Notes (Signed)
Subjective:    Patient ID: Frank Carlson, male    DOB: December 05, 1940, 78 y.o.   MRN: [id number]  HPI Virtual Visit via Telephone Note  I connected with Frank Carlson on 10/05/19 at 12:15 PM EST by telephone and verified that I am speaking with the correct person using two identifiers.  Location: Patient: home Provider: office   I discussed the limitations, risks, security and privacy concerns of performing an evaluation and management service by telephone and the availability of in person appointments. I also discussed with the patient that there may be a patient responsible charge related to this service. The patient expressed understanding and agreed to proceed.   History of Present Illness: Starting about a week ago, sugars are up to 124 (from usually 90-105) Next day 136, then 142, 150 Today 206 These are all fasting other than the 206 (pre lunch)  "Something has gone drastically wrong" with my sugars Awoke today with low back pain on the right side---"by waist" This is better now---"I know it is there" No dysuria or hematuria No fever  Eating about the same Slight cough Voice is dry No SOB  Current Outpatient Medications on File Prior to Visit  Medication Sig Dispense Refill  . Alcohol Swabs PADS 1 Package by Does not apply route as needed. 100 each 3  . Calcium Carb-Ergocalciferol (CHEWABLE CALCIUM/D PO) Take 1 tablet by mouth.    Marland Kitchen glucose blood (TRUE METRIX BLOOD GLUCOSE TEST) test strip Use to test blood sugar once a day. Dx Code: E08.40 (Patient taking differently: Use to test blood sugar twice a month  Dx Code: E08.40) 100 each 3  . metFORMIN (GLUCOPHAGE) 500 MG tablet TAKE 1 TABLET EVERY DAY WITH BREAKFAST 90 tablet 3  . Multiple Vitamin (MULTIVITAMIN WITH MINERALS) TABS tablet Take 1 tablet by mouth daily.    . Omega-3 Fatty Acids (FISH OIL) 1200 MG CAPS Take 1 capsule by mouth daily.    . simvastatin (ZOCOR) 20 MG tablet TAKE 1 TABLET (20 MG TOTAL) BY MOUTH  AT BEDTIME. 90 tablet 3  . TRUEPLUS LANCETS 30G MISC 1 Units by Does not apply route daily. Use to check blood sugar once a day. Dx Code [id number] (Patient taking differently: 1 Units by Does not apply route. Use to check blood sugar twice a month. Dx Code E08.40) 100 each 3   No current facility-administered medications on file prior to visit.     Allergies  Allergen Reactions  . Bee Venom     Passed out    Past Medical History:  Diagnosis Date  . BPH (benign prostatic hypertrophy)   . COLONIC POLYPS, HX OF 03/15/2008  . 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 CONCUSSION WITH LOC OF 30 MINUTES OR LESS 11/01/2009  . ED (erectile dysfunction)   . HEARING LOSS, BILATERAL 11/01/2009   Wears hearing aids  . Hyperlipidemia   . JOINT STIFFNESS, HAND 10/28/2008  . Type II or unspecified type diabetes mellitus with neurological manifestations, not stated as uncontrolled(250.60) 1/15   Type 2    Past Surgical History:  Procedure Laterality Date  . CATARACT EXTRACTION Bilateral 07/2010   OD with IOL  . COLONOSCOPY W/ POLYPECTOMY    . INCISION / DRAINAGE HAND / FINGER Right   . INGUINAL HERNIA REPAIR Right 08/23/2015   Procedure: LAPAROSCOPIC RIGHT INGUINAL HERNIA REPAIR WITH MESH;  Surgeon: Ralene Ok, MD;  Location: [facility];  Service: General;  Laterality: Right;  . INSERTION OF MESH Right 08/23/2015  Procedure: INSERTION OF MESH;  Surgeon: Ralene Ok, MD;  Location: [facility];  Service: General;  Laterality: Right;  . TONSILLECTOMY    . VASECTOMY      Family History  Problem Relation Age of Onset  . Alzheimer's disease Mother   . Dementia Mother   . Coronary artery disease Father   . Heart disease Father   . Coronary artery disease Other   . Diabetes Other   . Alzheimer's disease Sister   . Dementia Brother   . Alzheimer's disease Brother     Social History   Socioeconomic History  . Marital status: Married    Spouse name: Lovey Newcomer  . Number of children: 3  . Years of education:  45  . Highest education level: Not on file  Occupational History  . Occupation: Therapist, occupational    Comment: Retired  Scientific laboratory technician  . Financial resource strain: Not on file  . Food insecurity    Worry: Not on file    Inability: Not on file  . Transportation needs    Medical: Not on file    Non-medical: Not on file  Tobacco Use  . Smoking status: Former Smoker    Packs/day: 2.00    Years: 5.00    Pack years: 10.00    Types: Cigarettes    Quit date: 11/25/1958    Years since quitting: 60.9  . Smokeless tobacco: Never Used  Substance and Sexual Activity  . Alcohol use: No  . Drug use: No  . Sexual activity: Yes    Partners: Female  Lifestyle  . Physical activity    Days per week: Not on file    Minutes per session: Not on file  . Stress: Not on file  Relationships  . Social Herbalist on phone: Not on file    Gets together: Not on file    Attends religious service: Not on file    Active member of club or organization: Not on file    Attends meetings of clubs or organizations: Not on file    Relationship status: Not on file  . Intimate partner violence    Fear of current or ex partner: Not on file    Emotionally abused: Not on file    Physically abused: Not on file    Forced sexual activity: Not on file  Other Topics Concern  . Not on file  Social History Narrative   [employer]- Estate manager/land agent.    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    Married '64. 3 sons- '66, '68, '71; 5 grandchildren, 1 step g-dtr.       Has living will   Wife is health care POA--then son Mitzi Hansen   Would accept resuscitation attempts but no prolonged ventilatory support   Probably wouldn't want prolonged tube feeds     Observations/Objective: Slight hoarse No apparent breathing difficulty  Assessment and Plan:   Follow Up Instructions:    I discussed the assessment and treatment plan with the patient. The patient was provided an opportunity to ask  questions and all were answered. The patient agreed with the plan and demonstrated an understanding of the instructions.   The patient was advised to call back or seek an in-person evaluation if the symptoms worsen or if the condition fails to improve as anticipated.  I provided 12 minutes of non-face-to-face time during this encounter.   Viviana Simpler, MD    Review of Systems     Objective:  Physical Exam         Assessment & Plan:

## 2019-10-05 NOTE — Telephone Encounter (Signed)
Per appt notes pt already has virtual appt with Dr Silvio Pate 10/05/19 at 12:15.

## 2019-10-05 NOTE — Telephone Encounter (Signed)
[facility] Night - Client TELEPHONE ADVICE RECORD [facility] Patient Name: Frank Carlson Gender: Male DOB: 10-Mar-1941 Age: 78 Y 2 M 5 D Return Phone Number: [phone] (Primary) Address: City/State/Zip: [place] [address] Client [facility] Night - Client Client Site [facility] Physician Viviana Simpler - MD Contact Type Call Who Is Calling Patient / Member / Family / Caregiver Call Type Triage / Clinical Relationship To Patient Self Return Phone Number [phone] (Primary) Chief Complaint Back Pain - General Reason for Call Request to Schedule Office Appointment Initial Comment Caller states he has diabetes and now having lower back pain on the right side last few days. Wanting to get appt asap. Current BS is 160. Translation No Nurse Assessment Nurse: Rene Kocher, RN, [place] Date/Time (Eastern Time): 10/05/2019 7:59:17 AM Confirm and document reason for call. If symptomatic, describe symptoms. ---caller states he has diabetes. last night pain on right side of lower back. no blood in urine. current BS 164. Has the patient had close contact with a person known or suspected to have the novel coronavirus illness OR traveled / lives in area with major community spread (including international travel) in the last 14 days from the onset of symptoms? * If Asymptomatic, screen for exposure and travel within the last 14 days. ---No Does the patient have any new or worsening symptoms? ---Yes Will a triage be completed? ---Yes Related visit to physician within the last 2 weeks? ---No Does the PT have any chronic conditions? (i.e. diabetes, asthma, this includes High risk factors for pregnancy, etc.) ---Yes List chronic conditions. ---diabetes Is this a behavioral health or substance abuse call? ---No Guidelines Guideline Title Affirmed Question Affirmed Notes Nurse Date/Time (Eastern Time) Diabetes -  High Blood Sugar Blood glucose 70-240 mg/dL (3.9 -13.3 mmol/ L) [place], RN, [facility] 10/05/2019 8:01:57 AM Disp. Time Eilene Ghazi Time) Disposition Final User 10/05/2019 7:46:28 AM Attempt made - message left Coulter, RN, [place] PLEASE NOTE: All timestamps contained within this report are represented as [place] Standard Time. CONFIDENTIALTY NOTICE: This fax transmission is intended only for the addressee. It contains information that is legally privileged, confidential or otherwise protected from use or disclosure. If you are not the intended recipient, you are strictly prohibited from reviewing, disclosing, copying using or disseminating any of this information or taking any action in reliance on or regarding this information. If you have received this fax in error, please notify us immediately by telephone so that we can arrange for its return to [place]. Phone: [phone], Toll-Free: [phone], Fax: [phone] Page: 2 of 2 Call Id: [id number] 10/05/2019 [place], RN, [facility] Caller Disagree/Comply Comply Caller Understands Yes PreDisposition Call Doctor Care Advice Given Per Guideline * Definition: Fasting blood glucose over 140 mg/dL (7.8 mmol/L) or random blood glucose over 200 mg/dL (11.1 mmol/L). * You should be able to treat this at home. * Medical check-ups: See your doctor regularly. CARE ADVICE given per Diabetes - High Blood Sugar (Adult) guideline. * You become worse. * Blood glucose over 300 mg/dL (16.7 mmol/L) two or more times in a row CALL BACK IF: Comments User: [place], [place], RN Date/Time (Eastern Time): 10/05/2019 8:06:19 AM denies need for triage of back pain. User: [place], [place], RN Date/Time (Eastern Time): 10/05/2019 8:06:54 AM warm transferred caller to [phone] for appt. scheduling.

## 2019-10-05 NOTE — Assessment & Plan Note (Signed)
Not really sick but sugars now higher Will increase the metformin to  500mg  bid Monitor fastings  Has appt for ~3 weeks----will review this then

## 2019-10-05 NOTE — Telephone Encounter (Signed)
Will assess at the virtual visit later this morning

## 2019-10-10 ENCOUNTER — Inpatient Hospital Stay (HOSPITAL_COMMUNITY): Payer: Medicare HMO

## 2019-10-10 ENCOUNTER — Emergency Department (HOSPITAL_COMMUNITY): Payer: Medicare HMO

## 2019-10-10 ENCOUNTER — Encounter (HOSPITAL_COMMUNITY): Payer: Self-pay | Admitting: Emergency Medicine

## 2019-10-10 ENCOUNTER — Other Ambulatory Visit: Payer: Self-pay

## 2019-10-10 ENCOUNTER — Ambulatory Visit
Admission: EM | Admit: 2019-10-10 | Discharge: 2019-10-10 | Disposition: A | Discharge status: Home / Self Care | Payer: Medicare HMO | Source: Home / Self Care

## 2019-10-10 ENCOUNTER — Inpatient Hospital Stay (HOSPITAL_COMMUNITY)
Admission: EM | Admit: 2019-10-10 | Discharge: 2019-10-16 | DRG: 177 | Disposition: A | Discharge status: Home / Self Care | Payer: Medicare HMO | Attending: Family Medicine | Admitting: Family Medicine

## 2019-10-10 DIAGNOSIS — Z9889 Other specified postprocedural states: Secondary | ICD-10-CM

## 2019-10-10 DIAGNOSIS — N281 Cyst of kidney, acquired: Secondary | ICD-10-CM | POA: Diagnosis not present

## 2019-10-10 DIAGNOSIS — Z7984 Long term (current) use of oral hypoglycemic drugs: Secondary | ICD-10-CM

## 2019-10-10 DIAGNOSIS — W1830XA Fall on same level, unspecified, initial encounter: Secondary | ICD-10-CM | POA: Diagnosis present

## 2019-10-10 DIAGNOSIS — Z833 Family history of diabetes mellitus: Secondary | ICD-10-CM

## 2019-10-10 DIAGNOSIS — E1149 Type 2 diabetes mellitus with other diabetic neurological complication: Secondary | ICD-10-CM | POA: Diagnosis present

## 2019-10-10 DIAGNOSIS — E43 Unspecified severe protein-calorie malnutrition: Secondary | ICD-10-CM | POA: Diagnosis not present

## 2019-10-10 DIAGNOSIS — S199XXA Unspecified injury of neck, initial encounter: Secondary | ICD-10-CM | POA: Diagnosis not present

## 2019-10-10 DIAGNOSIS — J869 Pyothorax without fistula: Secondary | ICD-10-CM | POA: Diagnosis not present

## 2019-10-10 DIAGNOSIS — E0840 Diabetes mellitus due to underlying condition with diabetic neuropathy, unspecified: Secondary | ICD-10-CM

## 2019-10-10 DIAGNOSIS — Z91030 Bee allergy status: Secondary | ICD-10-CM

## 2019-10-10 DIAGNOSIS — Z20828 Contact with and (suspected) exposure to other viral communicable diseases: Secondary | ICD-10-CM | POA: Diagnosis present

## 2019-10-10 DIAGNOSIS — Z820 Family history of epilepsy and other diseases of the nervous system: Secondary | ICD-10-CM

## 2019-10-10 DIAGNOSIS — A419 Sepsis, unspecified organism: Secondary | ICD-10-CM | POA: Diagnosis not present

## 2019-10-10 DIAGNOSIS — Z87891 Personal history of nicotine dependence: Secondary | ICD-10-CM

## 2019-10-10 DIAGNOSIS — I4510 Unspecified right bundle-branch block: Secondary | ICD-10-CM | POA: Diagnosis not present

## 2019-10-10 DIAGNOSIS — J8410 Pulmonary fibrosis, unspecified: Secondary | ICD-10-CM | POA: Diagnosis not present

## 2019-10-10 DIAGNOSIS — Z781 Physical restraint status: Secondary | ICD-10-CM

## 2019-10-10 DIAGNOSIS — Z6825 Body mass index (BMI) 25.0-25.9, adult: Secondary | ICD-10-CM

## 2019-10-10 DIAGNOSIS — T859XXA Unspecified complication of internal prosthetic device, implant and graft, initial encounter: Secondary | ICD-10-CM | POA: Diagnosis not present

## 2019-10-10 DIAGNOSIS — Z4682 Encounter for fitting and adjustment of non-vascular catheter: Secondary | ICD-10-CM | POA: Diagnosis not present

## 2019-10-10 DIAGNOSIS — J342 Deviated nasal septum: Secondary | ICD-10-CM | POA: Diagnosis present

## 2019-10-10 DIAGNOSIS — D509 Iron deficiency anemia, unspecified: Secondary | ICD-10-CM | POA: Diagnosis present

## 2019-10-10 DIAGNOSIS — J181 Lobar pneumonia, unspecified organism: Secondary | ICD-10-CM | POA: Diagnosis not present

## 2019-10-10 DIAGNOSIS — H9193 Unspecified hearing loss, bilateral: Secondary | ICD-10-CM | POA: Diagnosis present

## 2019-10-10 DIAGNOSIS — Z8249 Family history of ischemic heart disease and other diseases of the circulatory system: Secondary | ICD-10-CM | POA: Diagnosis not present

## 2019-10-10 DIAGNOSIS — Z23 Encounter for immunization: Secondary | ICD-10-CM

## 2019-10-10 DIAGNOSIS — S022XXA Fracture of nasal bones, initial encounter for closed fracture: Secondary | ICD-10-CM | POA: Diagnosis present

## 2019-10-10 DIAGNOSIS — J90 Pleural effusion, not elsewhere classified: Secondary | ICD-10-CM | POA: Diagnosis not present

## 2019-10-10 DIAGNOSIS — N400 Enlarged prostate without lower urinary tract symptoms: Secondary | ICD-10-CM | POA: Diagnosis present

## 2019-10-10 DIAGNOSIS — J189 Pneumonia, unspecified organism: Secondary | ICD-10-CM | POA: Diagnosis present

## 2019-10-10 DIAGNOSIS — E785 Hyperlipidemia, unspecified: Secondary | ICD-10-CM | POA: Diagnosis not present

## 2019-10-10 DIAGNOSIS — D649 Anemia, unspecified: Secondary | ICD-10-CM | POA: Diagnosis present

## 2019-10-10 DIAGNOSIS — Z9689 Presence of other specified functional implants: Secondary | ICD-10-CM

## 2019-10-10 DIAGNOSIS — S0990XA Unspecified injury of head, initial encounter: Secondary | ICD-10-CM | POA: Diagnosis not present

## 2019-10-10 DIAGNOSIS — R945 Abnormal results of liver function studies: Secondary | ICD-10-CM | POA: Diagnosis not present

## 2019-10-10 DIAGNOSIS — R0602 Shortness of breath: Secondary | ICD-10-CM | POA: Diagnosis not present

## 2019-10-10 HISTORY — DX: Type 2 diabetes mellitus without complications: E11.9

## 2019-10-10 HISTORY — DX: Pneumonia, unspecified organism: J18.9

## 2019-10-10 LAB — CBC WITH DIFFERENTIAL/PLATELET
Abs Immature Granulocytes: 0.1 10*3/uL — ABNORMAL HIGH (ref 0.00–0.07)
Basophils Absolute: 0 10*3/uL (ref 0.0–0.1)
Basophils Relative: 0 %
Eosinophils Absolute: 0 10*3/uL (ref 0.0–0.5)
Eosinophils Relative: 0 %
HCT: 33.8 % — ABNORMAL LOW (ref 39.0–52.0)
Hemoglobin: 10.9 g/dL — ABNORMAL LOW (ref 13.0–17.0)
Immature Granulocytes: 1 %
Lymphocytes Relative: 8 %
Lymphs Abs: 1.4 10*3/uL (ref 0.7–4.0)
MCH: 27.2 pg (ref 26.0–34.0)
MCHC: 32.2 g/dL (ref 30.0–36.0)
MCV: 84.3 fL (ref 80.0–100.0)
Monocytes Absolute: 2.2 10*3/uL — ABNORMAL HIGH (ref 0.1–1.0)
Monocytes Relative: 12 %
Neutro Abs: 13.8 10*3/uL — ABNORMAL HIGH (ref 1.7–7.7)
Neutrophils Relative %: 79 %
Platelets: 305 10*3/uL (ref 150–400)
RBC: 4.01 MIL/uL — ABNORMAL LOW (ref 4.22–5.81)
RDW: 13.8 % (ref 11.5–15.5)
WBC: 17.5 10*3/uL — ABNORMAL HIGH (ref 4.0–10.5)
nRBC: 0 % (ref 0.0–0.2)

## 2019-10-10 LAB — COMPREHENSIVE METABOLIC PANEL
ALT: 145 U/L — ABNORMAL HIGH (ref 0–44)
AST: 120 U/L — ABNORMAL HIGH (ref 15–41)
Albumin: 2.2 g/dL — ABNORMAL LOW (ref 3.5–5.0)
Alkaline Phosphatase: 119 U/L (ref 38–126)
Anion gap: 13 (ref 5–15)
BUN: 13 mg/dL (ref 8–23)
CO2: 24 mmol/L (ref 22–32)
Calcium: 8.5 mg/dL — ABNORMAL LOW (ref 8.9–10.3)
Chloride: 100 mmol/L (ref 98–111)
Creatinine, Ser: 0.96 mg/dL (ref 0.61–1.24)
GFR calc Af Amer: >60 mL/min (ref >60)
GFR calc non Af Amer: >60 mL/min (ref >60)
Glucose, Bld: 160 mg/dL — ABNORMAL HIGH (ref 70–99)
Potassium: 3.9 mmol/L (ref 3.5–5.1)
Sodium: 137 mmol/L (ref 135–145)
Total Bilirubin: 1.6 mg/dL — ABNORMAL HIGH (ref 0.3–1.2)
Total Protein: 7 g/dL (ref 6.5–8.1)

## 2019-10-10 LAB — FIBRINOGEN: Fibrinogen: >800 mg/dL — ABNORMAL HIGH (ref 210–475)

## 2019-10-10 LAB — CBG MONITORING, ED
Glucose-Capillary: 131 mg/dL — ABNORMAL HIGH (ref 70–99)
Glucose-Capillary: 143 mg/dL — ABNORMAL HIGH (ref 70–99)
Glucose-Capillary: 152 mg/dL — ABNORMAL HIGH (ref 70–99)

## 2019-10-10 LAB — FERRITIN: Ferritin: 557 ng/mL — ABNORMAL HIGH (ref 24–336)

## 2019-10-10 LAB — URINALYSIS, ROUTINE W REFLEX MICROSCOPIC
Bacteria, UA: NONE SEEN
Bilirubin Urine: NEGATIVE
Glucose, UA: NEGATIVE mg/dL
Hgb urine dipstick: NEGATIVE
Ketones, ur: NEGATIVE mg/dL
Leukocytes,Ua: NEGATIVE
Nitrite: NEGATIVE
Protein, ur: 30 mg/dL — AB
Specific Gravity, Urine: 1.023 (ref 1.005–1.030)
pH: 5 (ref 5.0–8.0)

## 2019-10-10 LAB — TROPONIN I (HIGH SENSITIVITY)
Troponin I (High Sensitivity): 16 ng/L (ref <18)
Troponin I (High Sensitivity): 26 ng/L — ABNORMAL HIGH (ref <18)

## 2019-10-10 LAB — LACTATE DEHYDROGENASE: LDH: 182 U/L (ref 98–192)

## 2019-10-10 LAB — D-DIMER, QUANTITATIVE: D-Dimer, Quant: 6.13 ug/mL-FEU — ABNORMAL HIGH (ref 0.00–0.50)

## 2019-10-10 LAB — PROTIME-INR
INR: 1.1 (ref 0.8–1.2)
Prothrombin Time: 14 seconds (ref 11.4–15.2)

## 2019-10-10 LAB — C-REACTIVE PROTEIN: CRP: 20.9 mg/dL — ABNORMAL HIGH (ref <1.0)

## 2019-10-10 LAB — PROCALCITONIN: Procalcitonin: 0.27 ng/mL

## 2019-10-10 LAB — LACTIC ACID, PLASMA
Lactic Acid, Venous: 1.9 mmol/L (ref 0.5–1.9)
Lactic Acid, Venous: 1 mmol/L (ref 0.5–1.9)

## 2019-10-10 LAB — STREP PNEUMONIAE URINARY ANTIGEN: Strep Pneumo Urinary Antigen: NEGATIVE

## 2019-10-10 LAB — TRIGLYCERIDES: Triglycerides: 69 mg/dL (ref <150)

## 2019-10-10 LAB — POC SARS CORONAVIRUS 2 AG -  ED: SARS Coronavirus 2 Ag: NEGATIVE

## 2019-10-10 LAB — ETHANOL: Alcohol, Ethyl (B): <10 mg/dL (ref <10)

## 2019-10-10 MED ORDER — SODIUM CHLORIDE 0.9 % IV SOLN: 2.0000 g | Freq: Once | INTRAVENOUS | Status: DC

## 2019-10-10 MED ORDER — INSULIN ASPART 100 UNIT/ML ~~LOC~~ SOLN
0.0000 [IU] | SUBCUTANEOUS | Status: DC
  Administered 2019-10-10 – 2019-10-11 (×2): 1 [IU] via SUBCUTANEOUS
  Administered 2019-10-11: 22:00:00 3 [IU] via SUBCUTANEOUS
  Administered 2019-10-12 (×3): 2 [IU] via SUBCUTANEOUS
  Administered 2019-10-13: 13:00:00 3 [IU] via SUBCUTANEOUS
  Administered 2019-10-13: 05:00:00 1 [IU] via SUBCUTANEOUS
  Administered 2019-10-13 (×2): 2 [IU] via SUBCUTANEOUS
  Administered 2019-10-14: 05:00:00 1 [IU] via SUBCUTANEOUS

## 2019-10-10 MED ORDER — TETANUS-DIPHTH-ACELL PERTUSSIS 5-2.5-18.5 LF-MCG/0.5 IM SUSP
0.5000 mL | Freq: Once | INTRAMUSCULAR | Status: AC
  Administered 2019-10-10: 0.5 mL via INTRAMUSCULAR
  Filled 2019-10-10: qty 0.5

## 2019-10-10 MED ORDER — VANCOMYCIN HCL 10 G IV SOLR
1750.0000 mg | INTRAVENOUS | Status: DC
  Administered 2019-10-10: 19:00:00 1750 mg via INTRAVENOUS
  Filled 2019-10-10 (×2): qty 1750

## 2019-10-10 MED ORDER — VANCOMYCIN HCL 10 G IV SOLR
1750.0000 mg | Freq: Once | INTRAVENOUS | Status: DC
  Filled 2019-10-10: qty 1750

## 2019-10-10 MED ORDER — SODIUM CHLORIDE 0.9 % IV SOLN
2.0000 g | Freq: Three times a day (TID) | INTRAVENOUS | Status: DC
  Administered 2019-10-10 – 2019-10-15 (×13): 2 g via INTRAVENOUS
  Filled 2019-10-10 (×17): qty 2

## 2019-10-10 MED ORDER — ENOXAPARIN SODIUM 40 MG/0.4ML ~~LOC~~ SOLN
40.0000 mg | SUBCUTANEOUS | Status: DC
  Administered 2019-10-10 – 2019-10-15 (×5): 40 mg via SUBCUTANEOUS
  Filled 2019-10-10 (×6): qty 0.4

## 2019-10-10 MED ORDER — VANCOMYCIN HCL IN DEXTROSE 1-5 GM/200ML-% IV SOLN: 1000.0000 mg | Freq: Once | INTRAVENOUS | Status: DC

## 2019-10-10 MED ORDER — PRO-STAT SUGAR FREE PO LIQD
30.0000 mL | Freq: Two times a day (BID) | ORAL | Status: DC
  Administered 2019-10-11 – 2019-10-16 (×5): 30 mL via ORAL
  Filled 2019-10-10 (×8): qty 30

## 2019-10-10 MED ORDER — IOHEXOL 350 MG/ML SOLN
100.0000 mL | Freq: Once | INTRAVENOUS | Status: AC | PRN
  Administered 2019-10-10: 19:00:00 100 mL via INTRAVENOUS

## 2019-10-10 MED ORDER — SODIUM CHLORIDE 0.9% FLUSH
3.0000 mL | Freq: Once | INTRAVENOUS | Status: AC
  Administered 2019-10-10: 18:00:00 3 mL via INTRAVENOUS

## 2019-10-10 MED ORDER — ACETAMINOPHEN 500 MG PO TABS
1000.0000 mg | ORAL_TABLET | Freq: Once | ORAL | Status: AC
  Administered 2019-10-10: 18:00:00 1000 mg via ORAL
  Filled 2019-10-10: qty 2

## 2019-10-10 MED ORDER — SODIUM CHLORIDE 0.9 % IV SOLN
500.0000 mg | INTRAVENOUS | Status: DC
  Administered 2019-10-10 – 2019-10-14 (×4): 500 mg via INTRAVENOUS
  Filled 2019-10-10 (×6): qty 500

## 2019-10-10 NOTE — ED Notes (Signed)
X-ray at bedside

## 2019-10-10 NOTE — H&P (Addendum)
TRH H&P    Patient Demographics:    Frank Carlson, is a 78 y.o. male  MRN: [id number]  DOB - [birth year]  Admit Date - 10/10/2019  Referring MD/NP/PA:  Merleen Nicely   Outpatient Primary MD for the patient is Venia Carbon, MD  Patient coming from: home  Chief complaint-  Near syncope   HPI:    Frank Carlson  is a 78 y.o. male,   w hyperlipidemia, Dm2 Bph, recently tx for bronchitis 08/24/19 w doxycycline , presents with c/o near syncope.  Pt apparently was out walking, felt extremely dyspneic and fell forward on his face.  Pt notes cough x 2 weeks, nonproductive along with dyspnea for the past 2 weeks. Pt denies any fever til today.  Pt denies presyncopal symptoms.  Pt denies cp, palp, n/v, diarrhea, brbpr, alteration in sense of taste or smell, dysuria, hematura,  In ED,  T 102.4, P 101, R 20, Bp 150/81  Pox 95% on RA  Wbc 17.5, Hgb 10.9, Plt 305 Na 137, K 3.9 Bun 13, Creatinine 0.96 Alb 2.2, Ast 120, Alt 145 Alk phos 119, T. Bili 1.6 Etoh <10 Trop 16 D dimer 6.13 procalcitonin 0.27 LDH 182 Ferritin 557 crp 20.9 Urinalysis prot 30 Lactic acid 1.9 INR 1.1  CTA chest IMPRESSION: 1. No definite CT evidence of central pulmonary artery embolus. 2. Large right pleural effusion with associated mass effect and complete compressive atelectasis of the right lower lobe and atelectasis of the majority of the right middle lobe. Pneumonia or underlying mass is not excluded. Clinical correlation and follow-up to resolution recommended. 3. Aortic Atherosclerosis (ICD10-I70.0).  CT brain/ C spine IMPRESSION: 1. Comminuted bilateral nasal arch fracture with mild depression. 2. Anterior and inferior nasal septum fracture. The fracture is nondisplaced but the septum is deviated to the left. 3. No evidence of intracranial or cervical spine injury. 4. Large arachnoid cyst in the right middle cranial  fossa.  Ekg st at 100, nl axis, incomplete RBBB, hint of st depression in 2,3, avf  Pt will be admitted for near syncope, and CAP/ large right pleural effusion   Review of systems:    In addition to the HPI above,    No Headache, No changes with Vision or hearing, No problems swallowing food or Liquids, No Chest pain,  No Abdominal pain, No Nausea or Vomiting, bowel movements are regular, No Blood in stool or Urine, No dysuria,   No new joints pains-aches,  No new weakness, tingling, numbness in any extremity, No recent weight gain or loss, No polyuria, polydypsia or polyphagia, No significant Mental Stressors.  All other systems reviewed and are negative.    Past History of the following :    Past Medical History:  Diagnosis Date  . BPH (benign prostatic hypertrophy)   . COLONIC POLYPS, HX OF 03/15/2008  . 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 CONCUSSION WITH LOC OF 30 MINUTES OR LESS 11/01/2009  . ED (erectile dysfunction)   . HEARING LOSS, BILATERAL 11/01/2009   Wears hearing aids  . Hyperlipidemia   .  JOINT STIFFNESS, HAND 10/28/2008  . Type II or unspecified type diabetes mellitus with neurological manifestations, not stated as uncontrolled(250.60) 1/15   Type 2      Past Surgical History:  Procedure Laterality Date  . CATARACT EXTRACTION Bilateral 07/2010   OD with IOL  . COLONOSCOPY W/ POLYPECTOMY    . INCISION / DRAINAGE HAND / FINGER Right   . INGUINAL HERNIA REPAIR Right 08/23/2015   Procedure: LAPAROSCOPIC RIGHT INGUINAL HERNIA REPAIR WITH MESH;  Surgeon: Ralene Ok, MD;  Location: [facility];  Service: General;  Laterality: Right;  . INSERTION OF MESH Right 08/23/2015   Procedure: INSERTION OF MESH;  Surgeon: Ralene Ok, MD;  Location: [facility];  Service: General;  Laterality: Right;  . TONSILLECTOMY    . VASECTOMY        Social History:      Social History   Tobacco Use  . Smoking status: Former Smoker    Packs/day: 2.00    Years: 5.00    Pack  years: 10.00    Types: Cigarettes    Quit date: 11/25/1958    Years since quitting: 60.9  . Smokeless tobacco: Never Used  Substance Use Topics  . Alcohol use: No       Family History :     Family History  Problem Relation Age of Onset  . Alzheimer's disease Mother   . Dementia Mother   . Coronary artery disease Father   . Heart disease Father   . Coronary artery disease Other   . Diabetes Other   . Alzheimer's disease Sister   . Dementia Brother   . Alzheimer's disease Brother        Home Medications:   Prior to Admission medications   Medication Sig Start Date End Date Taking? Authorizing Provider  acetaminophen (TYLENOL) 325 MG tablet Take 325 mg by mouth every 6 (six) hours as needed for headache (pain).   Yes [provider]  Calcium Carb-Ergocalciferol (CHEWABLE CALCIUM/D PO) Take 1 tablet by mouth at bedtime.    Yes [provider]  metFORMIN (GLUCOPHAGE) 500 MG tablet Take 1 tablet (500 mg total) by mouth 2 (two) times daily with a meal. 10/05/19  Yes Venia Carbon, MD  Multiple Vitamin (MULTIVITAMIN WITH MINERALS) TABS tablet Take 1 tablet by mouth every morning.    Yes [provider]  Omega-3 Fatty Acids (FISH OIL PO) Take 1 capsule by mouth every morning.    Yes [provider]  simvastatin (ZOCOR) 20 MG tablet TAKE 1 TABLET (20 MG TOTAL) BY MOUTH AT BEDTIME. 06/22/19  Yes Venia Carbon, MD  Alcohol Swabs PADS 1 Package by Does not apply route as needed. 12/03/16   Venia Carbon, MD  glucose blood (TRUE METRIX BLOOD GLUCOSE TEST) test strip Use to test blood sugar once a day. Dx Code: E08.40 Patient taking differently: Use to test blood sugar twice a month  Dx Code: E08.40 12/03/16   Venia Carbon, MD  TRUEPLUS LANCETS 30G MISC 1 Units by Does not apply route daily. Use to check blood sugar once a day. Dx Code X54.00 Patient taking differently: by Does not apply route See admin instructions. Use to check blood sugar  twice a month. Dx Code Q67.61 12/03/16   Venia Carbon, MD     Allergies:     Allergies  Allergen Reactions  . Bee Venom Other (See Comments)    Passed out     Physical Exam:   Vitals  Blood pressure 129/70, pulse 75, temperature (!) 102.4 F (39.1 C), temperature source Oral, resp. rate (!) 22, SpO2 95 %.  1.  General: axoxo3  2. Psychiatric: euthymic  3. Neurologic: Nonfocal, cn 2-12 intact, reflexes 2+ symmetric, diffuse w no clonus, motor 5/5 in all 4 ext  4. HEENMT:  Anicteric, pupils 1.36m symmetric, direct, consensual intact + bruise on nose Neck: no jvd  5. Respiratory : Decrease in bs about 1/2 up on right, dullness to percussion Faint crackles at left lung base, no wheezing  6. Cardiovascular : rrr s1, s2, no m/g/r  7. Gastrointestinal:  Abd: soft, nt, nd, +bs  8. Skin:  Ext: no c/c/e, + bruise as above  9.Musculoskeletal:  Good ROM    Data Review:    CBC Recent Labs  Lab 10/10/19 1722  WBC 17.5*  HGB 10.9*  HCT 33.8*  PLT 305  MCV 84.3  MCH 27.2  MCHC 32.2  RDW 13.8  LYMPHSABS 1.4  MONOABS 2.2*  EOSABS 0.0  BASOSABS 0.0   ------------------------------------------------------------------------------------------------------------------  Results for orders placed or performed during the hospital encounter of 10/10/19 (from the past 48 hour(s))  CBG monitoring, ED     Status: Abnormal   Collection Time: 10/10/19  5:19 PM  Result Value Ref Range   Glucose-Capillary 152 (H) 70 - 99 mg/dL  Comprehensive metabolic panel     Status: Abnormal   Collection Time: 10/10/19  5:22 PM  Result Value Ref Range   Sodium 137 135 - 145 mmol/L   Potassium 3.9 3.5 - 5.1 mmol/L   Chloride 100 98 - 111 mmol/L   CO2 24 22 - 32 mmol/L   Glucose, Bld 160 (H) 70 - 99 mg/dL   BUN 13 8 - 23 mg/dL   Creatinine, Ser 0.96 0.61 - 1.24 mg/dL   Calcium 8.5 (L) 8.9 - 10.3 mg/dL   Total Protein 7.0 6.5 - 8.1 g/dL   Albumin 2.2 (L) 3.5 - 5.0 g/dL   AST  120 (H) 15 - 41 U/L   ALT 145 (H) 0 - 44 U/L   Alkaline Phosphatase 119 38 - 126 U/L   Total Bilirubin 1.6 (H) 0.3 - 1.2 mg/dL   GFR calc non Af Amer >60 >60 mL/min   GFR calc Af Amer >60 >60 mL/min   Anion gap 13 5 - 15    Comment: Performed at [facility] [address], [place] [address] Ethanol     Status: None   Collection Time: 10/10/19  5:22 PM  Result Value Ref Range   Alcohol, Ethyl (B) <10 <10 mg/dL    Comment: (NOTE) Lowest detectable limit for serum alcohol is 10 mg/dL. For medical purposes only. Performed at [facility] [address], [place] [address]  Troponin I (High Sensitivity)     Status: None   Collection Time: 10/10/19  5:22 PM  Result Value Ref Range   Troponin I (High Sensitivity) 16 <18 ng/L    Comment: (NOTE) Elevated high sensitivity troponin I (hsTnI) values and significant  changes across serial measurements may suggest ACS but many other  chronic and acute conditions are known to elevate hsTnI results.  Refer to the "Links" section for chest pain algorithms and additional  guidance. Performed at [facility] [address] [place] [address]  Lactic acid, plasma     Status: None   Collection Time: 10/10/19  5:22 PM  Result Value Ref  Range   Lactic Acid, Venous 1.9 0.5 - 1.9 mmol/L    Comment: Performed at [facility] [address], [place], [place] [address]  CBC with Differential     Status: Abnormal   Collection Time: 10/10/19  5:22 PM  Result Value Ref Range   WBC 17.5 (H) 4.0 - 10.5 K/uL   RBC 4.01 (L) 4.22 - 5.81 MIL/uL   Hemoglobin 10.9 (L) 13.0 - 17.0 g/dL   HCT 33.8 (L) 39.0 - 52.0 %   MCV 84.3 80.0 - 100.0 fL   MCH 27.2 26.0 - 34.0 pg   MCHC 32.2 30.0 - 36.0 g/dL   RDW 13.8 11.5 - 15.5 %   Platelets 305 150 - 400 K/uL   nRBC 0.0 0.0 - 0.2 %   Neutrophils Relative % 79 %   Neutro Abs 13.8 (H) 1.7 - 7.7 K/uL   Lymphocytes Relative 8 %   Lymphs Abs 1.4 0.7 - 4.0 K/uL    Monocytes Relative 12 %   Monocytes Absolute 2.2 (H) 0.1 - 1.0 K/uL   Eosinophils Relative 0 %   Eosinophils Absolute 0.0 0.0 - 0.5 K/uL   Basophils Relative 0 %   Basophils Absolute 0.0 0.0 - 0.1 K/uL   Immature Granulocytes 1 %   Abs Immature Granulocytes 0.10 (H) 0.00 - 0.07 K/uL    Comment: Performed at [facility] [address], [place], [place] [address]  Protime-INR     Status: None   Collection Time: 10/10/19  5:22 PM  Result Value Ref Range   Prothrombin Time 14.0 11.4 - 15.2 seconds   INR 1.1 0.8 - 1.2    Comment: (NOTE) INR goal varies based on device and disease states. Performed at [facility], [place] [address], [place], [place] [address]   D-dimer, quantitative     Status: Abnormal   Collection Time: 10/10/19  5:22 PM  Result Value Ref Range   D-Dimer, Quant 6.13 (H) 0.00 - 0.50 ug/mL-FEU    Comment: (NOTE) At the manufacturer cut-off of 0.50 ug/mL FEU, this assay has been documented to exclude PE with a sensitivity and negative predictive value of 97 to 99%.  At this time, this assay has not been approved by the FDA to exclude DVT/VTE. Results should be correlated with clinical presentation. Performed at [facility], [place] [address] [place], [place] [address]   Procalcitonin     Status: None   Collection Time: 10/10/19  5:22 PM  Result Value Ref Range   Procalcitonin 0.27 ng/mL    Comment:        Interpretation: PCT (Procalcitonin) <= 0.5 ng/mL: Systemic infection (sepsis) is not likely. Local bacterial infection is possible. (NOTE)       Sepsis PCT Algorithm           Lower Respiratory Tract                                      Infection PCT Algorithm    ----------------------------     ----------------------------         PCT < 0.25 ng/mL                PCT < 0.10 ng/mL         Strongly encourage             Strongly discourage   discontinuation of antibiotics  initiation of antibiotics    ----------------------------      -----------------------------       PCT 0.25 - 0.50 ng/mL            PCT 0.10 - 0.25 ng/mL               OR       >80% decrease in PCT            Discourage initiation of                                            antibiotics      Encourage discontinuation           of antibiotics    ----------------------------     -----------------------------         PCT >= 0.50 ng/mL              PCT 0.26 - 0.50 ng/mL               AND        <80% decrease in PCT             Encourage initiation of                                             antibiotics       Encourage continuation           of antibiotics    ----------------------------     -----------------------------        PCT >= 0.50 ng/mL                  PCT > 0.50 ng/mL               AND         increase in PCT                  Strongly encourage                                      initiation of antibiotics    Strongly encourage escalation           of antibiotics                                     -----------------------------                                           PCT <= 0.25 ng/mL                                                 OR                                        >  80% decrease in PCT                                     Discontinue / Do not initiate                                             antibiotics Performed at [facility], [place] [address], [place], [place] [address]   Lactate dehydrogenase     Status: None   Collection Time: 10/10/19  5:22 PM  Result Value Ref Range   LDH 182 98 - 192 U/L    Comment: Performed at [facility], [place] [address] [place], [place] [address]  Ferritin     Status: Abnormal   Collection Time: 10/10/19  5:22 PM  Result Value Ref Range   Ferritin 557 (H) 24 - 336 ng/mL    Comment: Performed at [facility], [place] [address] [place], [place] [address]  Triglycerides     Status: None   Collection Time: 10/10/19  5:22 PM  Result Value Ref Range   Triglycerides 69 <150  mg/dL    Comment: Performed at [facility] [address], [place], [place] [address]  Fibrinogen     Status: Abnormal   Collection Time: 10/10/19  5:22 PM  Result Value Ref Range   Fibrinogen >800 (H) 210 - 475 mg/dL    Comment: Performed at [facility] [address] [place], [place] [address]  C-reactive protein     Status: Abnormal   Collection Time: 10/10/19  5:22 PM  Result Value Ref Range   CRP 20.9 (H) <1.0 mg/dL    Comment: Performed at [facility] [address] [place], [place] [address]  Urinalysis, Routine w reflex microscopic     Status: Abnormal   Collection Time: 10/10/19  6:08 PM  Result Value Ref Range   Color, Urine AMBER (A) YELLOW    Comment: BIOCHEMICALS MAY BE AFFECTED BY COLOR   APPearance HAZY (A) CLEAR   Specific Gravity, Urine 1.023 1.005 - 1.030   pH 5.0 5.0 - 8.0   Glucose, UA NEGATIVE NEGATIVE mg/dL   Hgb urine dipstick NEGATIVE NEGATIVE   Bilirubin Urine NEGATIVE NEGATIVE   Ketones, ur NEGATIVE NEGATIVE mg/dL   Protein, ur 30 (A) NEGATIVE mg/dL   Nitrite NEGATIVE NEGATIVE   Leukocytes,Ua NEGATIVE NEGATIVE   RBC / HPF 0-5 0 - 5 RBC/hpf   WBC, UA 0-5 0 - 5 WBC/hpf   Bacteria, UA NONE SEEN NONE SEEN   Squamous Epithelial / LPF 0-5 0 - 5   Mucus PRESENT    Ca Oxalate Crys, UA PRESENT     Comment: Performed at [facility], [place] [address] [place], [place] [address]  POC SARS Coronavirus 2 Ag-ED - Nasal Swab (BD Veritor Kit)     Status: None   Collection Time: 10/10/19  8:20 PM  Result Value Ref Range   SARS Coronavirus 2 Ag NEGATIVE NEGATIVE    Comment: (NOTE) SARS-CoV-2 antigen NOT DETECTED.  Negative results are presumptive.  Negative results do not preclude SARS-CoV-2 infection and should not be used as the sole basis for treatment or other patient management decisions, including infection  control decisions, particularly in the presence of clinical  signs and  symptoms consistent with COVID-19, or in those who  have been in contact with the virus.  Negative results must be combined with clinical observations, patient history, and epidemiological information. The expected result is Negative. Fact Sheet for Patients: PodPark.tn Fact Sheet for Healthcare Providers: GiftContent.is This test is not yet approved or cleared by the [place] FDA and  has been authorized for detection and/or diagnosis of SARS-CoV-2 by FDA under an Emergency Use Authorization (EUA).  This EUA will remain in effect (meaning this test can be used) for the duration of  the COVID-19 de claration under Section 564(b)(1) of the Act, 21 U.S.C. section 360bbb-3(b)(1), unless the authorization is terminated or revoked sooner.     Chemistries  Recent Labs  Lab 10/10/19 1722  NA 137  K 3.9  CL 100  CO2 24  GLUCOSE 160*  BUN 13  CREATININE 0.96  CALCIUM 8.5*  AST 120*  ALT 145*  ALKPHOS 119  BILITOT 1.6*   ------------------------------------------------------------------------------------------------------------------  ------------------------------------------------------------------------------------------------------------------ GFR: Estimated Creatinine Clearance: 65.4 mL/min (by C-G formula based on SCr of 0.96 mg/dL). Liver Function Tests: Recent Labs  Lab 10/10/19 1722  AST 120*  ALT 145*  ALKPHOS 119  BILITOT 1.6*  PROT 7.0  ALBUMIN 2.2*   No results for input(s): LIPASE, AMYLASE in the last 168 hours. No results for input(s): AMMONIA in the last 168 hours. Coagulation Profile: Recent Labs  Lab 10/10/19 1722  INR 1.1   Cardiac Enzymes: No results for input(s): CKTOTAL, CKMB, CKMBINDEX, TROPONINI in the last 168 hours. BNP (last 3 results) No results for input(s): PROBNP in the last 8760 hours. HbA1C: No results for input(s): HGBA1C in the last 72 hours. CBG: Recent Labs  Lab 10/10/19 1719  GLUCAP 152*   Lipid  Profile: Recent Labs    10/10/19 1722  TRIG 69   Thyroid Function Tests: No results for input(s): TSH, T4TOTAL, FREET4, T3FREE, THYROIDAB in the last 72 hours. Anemia Panel: Recent Labs    10/10/19 1722  FERRITIN 557*    --------------------------------------------------------------------------------------------------------------- Urine analysis:    Component Value Date/Time   COLORURINE AMBER (A) 10/10/2019 1808   APPEARANCEUR HAZY (A) 10/10/2019 1808   LABSPEC 1.023 10/10/2019 1808   PHURINE 5.0 10/10/2019 1808   GLUCOSEU NEGATIVE 10/10/2019 1808   HGBUR NEGATIVE 10/10/2019 1808   HGBUR moderate 11/03/2009 0804   BILIRUBINUR NEGATIVE 10/10/2019 1808   BILIRUBINUR 2+ 11/16/2015 1227   KETONESUR NEGATIVE 10/10/2019 1808   PROTEINUR 30 (A) 10/10/2019 1808   UROBILINOGEN >=8.0 11/16/2015 1227   UROBILINOGEN 0.2 11/03/2009 0804   NITRITE NEGATIVE 10/10/2019 1808   LEUKOCYTESUR NEGATIVE 10/10/2019 1808      Imaging Results:    Ct Head Wo Contrast  Result Date: 10/10/2019 CLINICAL DATA:  Fall with facial injury. EXAM: CT HEAD WITHOUT CONTRAST CT MAXILLOFACIAL WITHOUT CONTRAST CT CERVICAL SPINE WITHOUT CONTRAST TECHNIQUE: Multidetector CT imaging of the head, cervical spine, and maxillofacial structures were performed using the standard protocol without intravenous contrast. Multiplanar CT image reconstructions of the cervical spine and maxillofacial structures were also generated. COMPARISON:  None. FINDINGS: CT HEAD FINDINGS Brain: No evidence of hemorrhage or swelling. No acute infarct, mass, or hydrocephalus. CSF density mass in the right middle cranial fossa with local mass effect, up to 4.6 cm in transverse span and 5.5 cm craniocaudal-consistent with arachnoid cyst. Generalized brain atrophy. There is dural-based calcification along the right cerebral convexity, question sequela of remote subdural hemorrhage Vascular: Atherosclerotic calcification. Skull: No calvarial  fracture.  CT MAXILLOFACIAL FINDINGS Osseous: Bilateral nasal arch fracture with comminution and mild depression on the right. Gas is seen in the anterior nasal septum where there is subtle fracture. No orbital or ethmoid continuation is seen. The mandible is intact and located. Orbits: Bilateral cataract resection. No evidence of postseptal injury. Sinuses: Expansive frontal sinuses. High-density frothy material in the right frontal sinus and right nasal cavity/nasopharynx is presumably epistaxis. In the inferior right frontal sinus is likely epistaxis Soft tissues: Soft tissue swelling about the nose. No opaque foreign body. CT CERVICAL SPINE FINDINGS Alignment: No traumatic malalignment. Skull base and vertebrae: Negative for acute fracture Soft tissues and spinal canal: No prevertebral fluid or swelling. No visible canal hematoma. Disc levels: Lower cervical degenerative disc narrowing and ridging. Generalized mild cervical facet spurring. Upper chest: Right-sided pleural effusion, reference dedicated chest CT IMPRESSION: 1. Comminuted bilateral nasal arch fracture with mild depression. 2. Anterior and inferior nasal septum fracture. The fracture is nondisplaced but the septum is deviated to the left. 3. No evidence of intracranial or cervical spine injury. 4. Large arachnoid cyst in the right middle cranial fossa. Electronically Signed   By: Monte Fantasia M.D.   On: 10/10/2019 19:06   Ct Angio Chest Pe W And/or Wo Contrast  Result Date: 10/10/2019 CLINICAL DATA:  78 year old male with shortness of breath. EXAM: CT ANGIOGRAPHY CHEST WITH CONTRAST TECHNIQUE: Multidetector CT imaging of the chest was performed using the standard protocol during bolus administration of intravenous contrast. Multiplanar CT image reconstructions and MIPs were obtained to evaluate the vascular anatomy. CONTRAST:  153m OMNIPAQUE IOHEXOL 350 MG/ML SOLN COMPARISON:  Chest radiograph dated 10/10/2019. FINDINGS: Cardiovascular:  There is no cardiomegaly or pericardial effusion. Mild atherosclerotic calcification of thoracic aorta. No aneurysmal dilatation or dissection. Coronary vascular calcification noted. Evaluation of the pulmonary arteries is limited due to suboptimal opacification and timing of the contrast. No definite large or central pulmonary artery embolus identified. Mediastinum/Nodes: No hilar adenopathy. Evaluation however is limited due to consolidative changes of the right lung. Focal area of soft tissue density to the right of the trachea (series 9, image 182) most consistent with azygos vein. Top-normal subcarinal lymph nodes. There is a small hiatal hernia. The esophagus and the thyroid gland are grossly unremarkable. No mediastinal fluid collection. Lungs/Pleura: There is a large right pleural effusion with associated mass effect and compressive atelectasis of the majority the right middle lobe and complete consolidation of the right lower lobe. Pneumonia or underlying mass is not excluded. Clinical correlation and follow-up to resolution recommended. Several calcified granuloma noted in the left upper lobe. There is no pneumothorax. The central airways are patent. Mucus secretions noted in the upper trachea. Upper Abdomen: Partially visualized cystic lesion from the interpolar left kidney. Musculoskeletal: Osteopenia. Old lower thoracic compression changes. No acute osseous pathology. Review of the MIP images confirms the above findings. IMPRESSION: 1. No definite CT evidence of central pulmonary artery embolus. 2. Large right pleural effusion with associated mass effect and complete compressive atelectasis of the right lower lobe and atelectasis of the majority of the right middle lobe. Pneumonia or underlying mass is not excluded. Clinical correlation and follow-up to resolution recommended. 3. Aortic Atherosclerosis (ICD10-I70.0). Electronically Signed   By: AAnner CreteM.D.   On: 10/10/2019 19:01   Ct  Cervical Spine Wo Contrast  Result Date: 10/10/2019 CLINICAL DATA:  Fall with facial injury. EXAM: CT HEAD WITHOUT CONTRAST CT MAXILLOFACIAL WITHOUT CONTRAST CT CERVICAL SPINE WITHOUT CONTRAST TECHNIQUE: Multidetector CT imaging of  the head, cervical spine, and maxillofacial structures were performed using the standard protocol without intravenous contrast. Multiplanar CT image reconstructions of the cervical spine and maxillofacial structures were also generated. COMPARISON:  None. FINDINGS: CT HEAD FINDINGS Brain: No evidence of hemorrhage or swelling. No acute infarct, mass, or hydrocephalus. CSF density mass in the right middle cranial fossa with local mass effect, up to 4.6 cm in transverse span and 5.5 cm craniocaudal-consistent with arachnoid cyst. Generalized brain atrophy. There is dural-based calcification along the right cerebral convexity, question sequela of remote subdural hemorrhage Vascular: Atherosclerotic calcification. Skull: No calvarial fracture. CT MAXILLOFACIAL FINDINGS Osseous: Bilateral nasal arch fracture with comminution and mild depression on the right. Gas is seen in the anterior nasal septum where there is subtle fracture. No orbital or ethmoid continuation is seen. The mandible is intact and located. Orbits: Bilateral cataract resection. No evidence of postseptal injury. Sinuses: Expansive frontal sinuses. High-density frothy material in the right frontal sinus and right nasal cavity/nasopharynx is presumably epistaxis. In the inferior right frontal sinus is likely epistaxis Soft tissues: Soft tissue swelling about the nose. No opaque foreign body. CT CERVICAL SPINE FINDINGS Alignment: No traumatic malalignment. Skull base and vertebrae: Negative for acute fracture Soft tissues and spinal canal: No prevertebral fluid or swelling. No visible canal hematoma. Disc levels: Lower cervical degenerative disc narrowing and ridging. Generalized mild cervical facet spurring. Upper chest:  Right-sided pleural effusion, reference dedicated chest CT IMPRESSION: 1. Comminuted bilateral nasal arch fracture with mild depression. 2. Anterior and inferior nasal septum fracture. The fracture is nondisplaced but the septum is deviated to the left. 3. No evidence of intracranial or cervical spine injury. 4. Large arachnoid cyst in the right middle cranial fossa. Electronically Signed   By: Monte Fantasia M.D.   On: 10/10/2019 19:06   Dg Chest Port 1 View  Result Date: 10/10/2019 CLINICAL DATA:  Shortness of breath. EXAM: PORTABLE CHEST 1 VIEW COMPARISON:  None. FINDINGS: There is a large right pleural effusion with underlying opacity. Two calcified granulomata are seen in the left lung. The right hilum and right heart border are not well seen due to the right-sided effusion. Within visualized limits, the heart, hila, and mediastinum are unremarkable. No pneumothorax. No other acute abnormalities. IMPRESSION: There is a large right-sided pleural effusion with underlying opacity which is nonspecific. No other acute abnormalities. Recommend short-term follow-up imaging to ensure resolution. Electronically Signed   By: Dorise Bullion III M.D   On: 10/10/2019 17:39   Ct Maxillofacial Wo Cm  Result Date: 10/10/2019 CLINICAL DATA:  Fall with facial injury. EXAM: CT HEAD WITHOUT CONTRAST CT MAXILLOFACIAL WITHOUT CONTRAST CT CERVICAL SPINE WITHOUT CONTRAST TECHNIQUE: Multidetector CT imaging of the head, cervical spine, and maxillofacial structures were performed using the standard protocol without intravenous contrast. Multiplanar CT image reconstructions of the cervical spine and maxillofacial structures were also generated. COMPARISON:  None. FINDINGS: CT HEAD FINDINGS Brain: No evidence of hemorrhage or swelling. No acute infarct, mass, or hydrocephalus. CSF density mass in the right middle cranial fossa with local mass effect, up to 4.6 cm in transverse span and 5.5 cm craniocaudal-consistent with  arachnoid cyst. Generalized brain atrophy. There is dural-based calcification along the right cerebral convexity, question sequela of remote subdural hemorrhage Vascular: Atherosclerotic calcification. Skull: No calvarial fracture. CT MAXILLOFACIAL FINDINGS Osseous: Bilateral nasal arch fracture with comminution and mild depression on the right. Gas is seen in the anterior nasal septum where there is subtle fracture. No orbital or ethmoid continuation is  seen. The mandible is intact and located. Orbits: Bilateral cataract resection. No evidence of postseptal injury. Sinuses: Expansive frontal sinuses. High-density frothy material in the right frontal sinus and right nasal cavity/nasopharynx is presumably epistaxis. In the inferior right frontal sinus is likely epistaxis Soft tissues: Soft tissue swelling about the nose. No opaque foreign body. CT CERVICAL SPINE FINDINGS Alignment: No traumatic malalignment. Skull base and vertebrae: Negative for acute fracture Soft tissues and spinal canal: No prevertebral fluid or swelling. No visible canal hematoma. Disc levels: Lower cervical degenerative disc narrowing and ridging. Generalized mild cervical facet spurring. Upper chest: Right-sided pleural effusion, reference dedicated chest CT IMPRESSION: 1. Comminuted bilateral nasal arch fracture with mild depression. 2. Anterior and inferior nasal septum fracture. The fracture is nondisplaced but the septum is deviated to the left. 3. No evidence of intracranial or cervical spine injury. 4. Large arachnoid cyst in the right middle cranial fossa. Electronically Signed   By: Monte Fantasia M.D.   On: 10/10/2019 19:06       Assessment & Plan:    Active Problems:   Diabetes mellitus with neurological manifestations, controlled ([place])   Hyperlipidemia   CAP (community acquired pneumonia)   Pleural effusion   Abnormal liver function   Anemia   Protein-calorie malnutrition, severe ([facility])  CAP Blood culture x2 Urine  legionella antigen Urine strep antigen vanco iv, cefepime iv pharmacy to dose Zithromax '500mg'$  iv qday  Large R pleural effusion  US thoracentesis Consider pulmonary consultation in am  Abnormal liver function STOP Simvastatin Check acute hepatitis panel Consider Stauffers syndrome, include renal imaging as below Check Abdominal ultrasound Check cmp in am  Anemia Check iron, tibc, b12, folate, consider spep, immunofixation Check cbc in am  Severe protein calorie malnutrition prostat 30 mL po bid  Dm2 Hold Metformin since had CTA chest Fsbs ac and qhs, ISS  H/o cystic lesion kidney Check abd ultrasound as above   DVT Prophylaxis-   Lovenox - SCDs   AM Labs Ordered, also please review Full Orders  Family Communication: Admission, patients condition and plan of care including tests being ordered have been discussed with the patient who indicate understanding and agree with the plan and Code Status.  Code Status:  FULL CODE per patient, notified wife of admission to [facility]  Admission status: /Inpatient: Based on patients clinical presentation and evaluation of above clinical data, I have made determination that patient meets Inpatient criteria at this time.  Pt has complicated pneumonia with large right pleural effusion, pt will require iv abx, and has high risk of clincal deterioration. Pt will require > 2 nites stay.   Time spent in minutes : 70 minutes   Jani Gravel M.D on 10/10/2019 at 9:11 PM

## 2019-10-10 NOTE — ED Notes (Signed)
Patient is being discharged from the Urgent [place] [facility] and sent to the Emergency Department via wheelchair by staff. Per Cathlean Sauer, PA, patient is stable but in need of higher level of care due to striking head secondary to fall. Patient is aware and verbalizes understanding of plan of care. There were no vitals filed for this visit.

## 2019-10-10 NOTE — ED Triage Notes (Signed)
Pt reports he was walking up a hill and became short of breath and dizzy. Pt then fell forwards. Has blood and swelling noted to facial bones, nose, mouth. Pt states he did not fully lose consciousness. Pt is diabetic. Denies chest pain.

## 2019-10-10 NOTE — ED Notes (Signed)
Patient transported to CT 

## 2019-10-10 NOTE — ED Notes (Signed)
Patient fell while walking in neighborhood.  No blood thinners, no loc.  Fell while striking forehead/nose on concretre  Patient has large area of bridge of nose and forehead involved, abrasion.  Bleeding is minimal now.  Amy yu, pa aware of patient, to ED.  Informed patient and spouse they should be seen at ed.  Patient agreeable

## 2019-10-10 NOTE — Progress Notes (Signed)
Pharmacy Antibiotic Note  Frank Carlson is a 78 y.o. male admitted on 10/10/2019 with pneumonia.  Pharmacy has been consulted for Cefepime and Vancomcyin dosing.     Temp (24hrs), Avg:102.4 F (39.1 C), Min:102.4 F (39.1 C), Max:102.4 F (39.1 C)  Recent Labs  Lab 10/10/19 1722  WBC 17.5*  CREATININE 0.96  LATICACIDVEN 1.9    Estimated Creatinine Clearance: 65.4 mL/min (by C-G formula based on SCr of 0.96 mg/dL).    Allergies  Allergen Reactions  . Bee Venom Other (See Comments)    Passed out    Antimicrobials this admission: 11/28 Cefepime >>  1/28 Vancomcyin >>   Dose adjustments this admission: N/a  Microbiology results: 11/28 BCx: Pending  Assessment: Patient is an 22 yom that presented to the ED after a syncopal episode this evening. The patient was recently treated with Doxy for PNA. On xray patient appears to have PNA and pharmacy has been asked to dose cefepime and vancomycin for this patient.   Plan: - Cefepime 2 grams IV q8h  - Vancomycin 1750 mg IV q24h  - Est calc Auc of 494 - Continue to monitor patient's renal function  Thank you for allowing pharmacy to be a part of this patient's care.  Frank Carlson PharmD. BCPS  10/10/2019 6:21 PM

## 2019-10-10 NOTE — ED Provider Notes (Signed)
[facility] EMERGENCY DEPARTMENT Provider Note   CSN: [place]:[id number] Arrival date & time: 10/10/19  1624     History   Chief Complaint Chief Complaint  Patient presents with  . Near Syncope  . Fall  . Head Injury    HPI Frank Carlson is a 78 y.o. male.     Frank Carlson is a 78 y.o. male with a history of type 2 diabetes, hyperlipidemia, BPH, hearing loss, who presents to the emergency department for evaluation after needle syncopal episode and fall.  Patient reports that he and his wife were walking in the neighborhood when he suddenly felt like he was falling, he attempted to catch himself with his hands but struck his face and his nose on the ground he has significant abrasions to the nose with small amount of bleeding.  Patient reports that he felt short of breath while he was going up the hill but did not have any associated chest pain or prodrome.  He reports he felt like he was going to lose consciousness but did not completely pass out, and did not lose consciousness upon striking his head on the ground.  Reports some bleeding from the nose afterwards.  Reports mild headache, denies vision changes, nausea, vomiting, numbness or weakness.  Patient was febrile on arrival, but denies history of recent fevers, he reports that over the past week he has had nasal congestion and a productive cough, but denies associated chest pain.  He has had shortness of breath primarily with exertion.  He reports some fatigue as well.  He reports that he had similar nasal congestion and cough a few weeks ago, and discussed with his primary care doctor who treated him with doxycycline for possible pneumonia he reports symptoms seem to resolve and the cough and nasal congestion he is experienced this week feels very similar.  He has not had a COVID-19 test.  Denies any known sick contacts.  No other aggravating or alleviating factors.     Past Medical History:  Diagnosis Date  .  BPH (benign prostatic hypertrophy)   . COLONIC POLYPS, HX OF 03/15/2008  . 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 CONCUSSION WITH LOC OF 30 MINUTES OR LESS 11/01/2009  . ED (erectile dysfunction)   . HEARING LOSS, BILATERAL 11/01/2009   Wears hearing aids  . Hyperlipidemia   . JOINT STIFFNESS, HAND 10/28/2008  . Type II or unspecified type diabetes mellitus with neurological manifestations, not stated as uncontrolled(250.60) 1/15   Type 2    Patient Active Problem List   Diagnosis Date Noted  . CAP (community acquired pneumonia) 10/10/2019  . Pleural effusion 10/10/2019  . Abnormal liver function 10/10/2019  . Anemia 10/10/2019  . Protein-calorie malnutrition, severe ([place]) 10/10/2019  . Seborrheic keratosis 12/03/2016  . BPH with obstruction/lower urinary tract symptoms 04/03/2016  . Advance directive discussed with patient 03/28/2015  . Hyperlipidemia   . Diabetes mellitus with neurological manifestations, controlled ([place]) 12/08/2013  . Routine health maintenance 11/26/2011  . 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 Presbycusis of both ears 11/01/2009  . COLONIC POLYPS, HX OF 03/15/2008    Past Surgical History:  Procedure Laterality Date  . CATARACT EXTRACTION Bilateral 07/2010   OD with IOL  . COLONOSCOPY W/ POLYPECTOMY    . INCISION / DRAINAGE HAND / FINGER Right   . INGUINAL HERNIA REPAIR Right 08/23/2015   Procedure: LAPAROSCOPIC RIGHT INGUINAL HERNIA REPAIR WITH MESH;  Surgeon: Ralene Ok, MD;  Location: [facility];  Service: General;  Laterality: Right;  . INSERTION OF MESH Right 08/23/2015   Procedure: INSERTION OF MESH;  Surgeon: Ralene Ok, MD;  Location: [facility];  Service: General;  Laterality: Right;  . TONSILLECTOMY    . VASECTOMY          Home Medications    Prior to Admission medications   Medication Sig Start Date End Date Taking? Authorizing Provider  acetaminophen (TYLENOL) 325 MG tablet Take 325 mg by mouth every 6 (six) hours as needed for headache (pain).   Yes  [provider]  Calcium Carb-Ergocalciferol (CHEWABLE CALCIUM/D PO) Take 1 tablet by mouth at bedtime.    Yes [provider]  metFORMIN (GLUCOPHAGE) 500 MG tablet Take 1 tablet (500 mg total) by mouth 2 (two) times daily with a meal. 10/05/19  Yes Venia Carbon, MD  Multiple Vitamin (MULTIVITAMIN WITH MINERALS) TABS tablet Take 1 tablet by mouth every morning.    Yes [provider]  Omega-3 Fatty Acids (FISH OIL PO) Take 1 capsule by mouth every morning.    Yes [provider]  simvastatin (ZOCOR) 20 MG tablet TAKE 1 TABLET (20 MG TOTAL) BY MOUTH AT BEDTIME. 06/22/19  Yes Venia Carbon, MD  Alcohol Swabs PADS 1 Package by Does not apply route as needed. 12/03/16   Venia Carbon, MD  glucose blood (TRUE METRIX BLOOD GLUCOSE TEST) test strip Use to test blood sugar once a day. Dx Code: E08.40 Patient taking differently: Use to test blood sugar twice a month  Dx Code: E08.40 12/03/16   Venia Carbon, MD  TRUEPLUS LANCETS 30G MISC 1 Units by Does not apply route daily. Use to check blood sugar once a day. Dx Code [id number] Patient taking differently: by Does not apply route See admin instructions. Use to check blood sugar twice a month. Dx Code [id number] 12/03/16   Venia Carbon, MD    Family History Family History  Problem Relation Age of Onset  . Alzheimer's disease Mother   . Dementia Mother   . Coronary artery disease Father   . Heart disease Father   . Coronary artery disease Other   . Diabetes Other   . Alzheimer's disease Sister   . Dementia Brother   . Alzheimer's disease Brother     Social History Social History   Tobacco Use  . Smoking status: Former Smoker    Packs/day: 2.00    Years: 5.00    Pack years: 10.00    Types: Cigarettes    Quit date: 11/25/1958    Years since quitting: 60.9  . Smokeless tobacco: Never Used  Substance Use Topics  . Alcohol use: No  . Drug use: No     Allergies   Bee venom   Review of  Systems Review of Systems  Constitutional: Positive for chills and fever.  HENT: Positive for congestion, nosebleeds and rhinorrhea. Negative for sore throat.   Eyes: Negative for pain and visual disturbance.  Respiratory: Positive for cough and shortness of breath.   Cardiovascular: Negative for chest pain.  Gastrointestinal: Negative for abdominal pain, diarrhea, nausea and vomiting.  Genitourinary: Negative for dysuria.  Musculoskeletal: Negative for arthralgias, myalgias and neck pain.  Skin: Positive for wound. Negative for color change.  Neurological: Positive for syncope and light-headedness. Negative for headaches.     Physical Exam Updated Vital Signs BP (!) 150/81   Pulse (!) 104   Temp (!) 102.4 F (39.1 C) (Oral)   Resp (!) 22   SpO2  92%   Physical Exam Vitals signs and nursing note reviewed.  Constitutional:      General: He is not in acute distress.    Appearance: Normal appearance. He is well-developed and normal weight. He is not diaphoretic.  HENT:     Head: Normocephalic and atraumatic.     Nose:     Comments: Swelling over the bridge of the nose, there are abrasions throughout the nose, but no active bleeding, there is some dried blood present in bilateral naris.  No evidence of septal hematoma.    Mouth/Throat:     Mouth: Mucous membranes are moist.     Pharynx: Oropharynx is clear.     Comments: Posterior oropharynx is clear, no bony tenderness or malocclusion of the jaw Eyes:     General:        Right eye: No discharge.        Left eye: No discharge.     Extraocular Movements: Extraocular movements intact.     Pupils: Pupils are equal, round, and reactive to light.  Neck:     Musculoskeletal: Neck supple.     Comments: No midline C-spine tenderness Cardiovascular:     Rate and Rhythm: Normal rate and regular rhythm.     Heart sounds: Normal heart sounds. No murmur. No friction rub. No gallop.   Pulmonary:     Effort: Pulmonary effort is normal.  No respiratory distress.     Breath sounds: No wheezing or rales.     Comments: Respirations equal and unlabored, satting around 95% on room air, on auscultation there are decreased breath sounds over the right side, there is no wheezing, rhonchi or rales noted Abdominal:     General: Bowel sounds are normal. There is no distension.     Palpations: Abdomen is soft. There is no mass.     Tenderness: There is no abdominal tenderness. There is no guarding.     Comments: Abdomen soft, nondistended, nontender to palpation in all quadrants without guarding or peritoneal signs  Musculoskeletal:        General: No deformity.     Comments: T-spine and L-spine nontender to palpation at midline. Patient moves all extremities without difficulty. All joints supple and easily movable, no erythema, swelling or palpable deformity, all compartments soft.  Skin:    General: Skin is warm and dry.     Capillary Refill: Capillary refill takes less than 2 seconds.  Neurological:     Mental Status: He is alert.     Coordination: Coordination normal.     Comments: Speech is clear, able to follow commands CN III-XII intact Normal strength in upper and lower extremities bilaterally including dorsiflexion and plantar flexion, strong and equal grip strength Sensation normal to light and sharp touch Moves extremities without ataxia, coordination intact  Psychiatric:        Mood and Affect: Mood normal.        Behavior: Behavior normal.      ED Treatments / Results  Labs (all labs ordered are listed, but only abnormal results are displayed) Labs Reviewed  URINALYSIS, ROUTINE W REFLEX MICROSCOPIC - Abnormal; Notable for the following components:      Result Value   Color, Urine AMBER (*)    APPearance HAZY (*)    Protein, ur 30 (*)    All other components within normal limits  COMPREHENSIVE METABOLIC PANEL - Abnormal; Notable for the following components:   Glucose, Bld 160 (*)    Calcium 8.5 (*)  Albumin 2.2 (*)    AST 120 (*)    ALT 145 (*)    Total Bilirubin 1.6 (*)    All other components within normal limits  CBC WITH DIFFERENTIAL/PLATELET - Abnormal; Notable for the following components:   WBC 17.5 (*)    RBC 4.01 (*)    Hemoglobin 10.9 (*)    HCT 33.8 (*)    Neutro Abs 13.8 (*)    Monocytes Absolute 2.2 (*)    Abs Immature Granulocytes 0.10 (*)    All other components within normal limits  D-DIMER, QUANTITATIVE (NOT AT [facility]) - Abnormal; Notable for the following components:   D-Dimer, Quant 6.13 (*)    All other components within normal limits  FERRITIN - Abnormal; Notable for the following components:   Ferritin 557 (*)    All other components within normal limits  FIBRINOGEN - Abnormal; Notable for the following components:   Fibrinogen >800 (*)    All other components within normal limits  C-REACTIVE PROTEIN - Abnormal; Notable for the following components:   CRP 20.9 (*)    All other components within normal limits  CBG MONITORING, ED - Abnormal; Notable for the following components:   Glucose-Capillary 152 (*)    All other components within normal limits  URINE CULTURE  CULTURE, BLOOD (ROUTINE X 2)  CULTURE, BLOOD (ROUTINE X 2)  SARS CORONAVIRUS 2 (TAT 6-24 HRS)  RESPIRATORY PANEL BY PCR  BORDETELLA PERTUSSIS PCR  ETHANOL  LACTIC ACID, PLASMA  PROTIME-INR  PROCALCITONIN  LACTATE DEHYDROGENASE  TRIGLYCERIDES  LACTIC ACID, PLASMA  BASIC METABOLIC PANEL  HIV ANTIBODY (ROUTINE TESTING W REFLEX)  LEGIONELLA PNEUMOPHILA SEROGP 1 UR AG  STREP PNEUMONIAE URINARY ANTIGEN  INFLUENZA PANEL BY PCR (TYPE A & B)  ADENOVIRUS ANTIBODIES  CBC  HEPATIC FUNCTION PANEL  HEPATITIS PANEL, ACUTE  CK TOTAL AND CKMB (NOT AT [facility])  IRON AND TIBC  VITAMIN B12  FOLATE RBC  SEDIMENTATION RATE  HEMOGLOBIN A1C  POC SARS CORONAVIRUS 2 AG -  ED  TROPONIN I (HIGH SENSITIVITY)  TROPONIN I (HIGH SENSITIVITY)    EKG EKG Interpretation  Date/Time:  Saturday October 10 2019 16:29:31 EST Ventricular Rate:  105 PR Interval:  144 QRS Duration: 94 QT Interval:  418 QTC Calculation: 552 R Axis:   88 Text Interpretation: Critical Test Result: Long QTc Sinus tachycardia Incomplete right bundle branch block Nonspecific ST abnormality Prolonged QT Abnormal ECG old RBBB. Increased rate, otherwise no sig change from previous Confirmed by Charlesetta Shanks [id number]) on 10/10/2019 4:41:48 PM   Radiology Ct Head Wo Contrast  Result Date: 10/10/2019 CLINICAL DATA:  Fall with facial injury. EXAM: CT HEAD WITHOUT CONTRAST CT MAXILLOFACIAL WITHOUT CONTRAST CT CERVICAL SPINE WITHOUT CONTRAST TECHNIQUE: Multidetector CT imaging of the head, cervical spine, and maxillofacial structures were performed using the standard protocol without intravenous contrast. Multiplanar CT image reconstructions of the cervical spine and maxillofacial structures were also generated. COMPARISON:  None. FINDINGS: CT HEAD FINDINGS Brain: No evidence of hemorrhage or swelling. No acute infarct, mass, or hydrocephalus. CSF density mass in the right middle cranial fossa with local mass effect, up to 4.6 cm in transverse span and 5.5 cm craniocaudal-consistent with arachnoid cyst. Generalized brain atrophy. There is dural-based calcification along the right cerebral convexity, question sequela of remote subdural hemorrhage Vascular: Atherosclerotic calcification. Skull: No calvarial fracture. CT MAXILLOFACIAL FINDINGS Osseous: Bilateral nasal arch fracture with comminution and mild depression on the right. Gas is seen in the anterior nasal septum where  there is subtle fracture. No orbital or ethmoid continuation is seen. The mandible is intact and located. Orbits: Bilateral cataract resection. No evidence of postseptal injury. Sinuses: Expansive frontal sinuses. High-density frothy material in the right frontal sinus and right nasal cavity/nasopharynx is presumably epistaxis. In the inferior right frontal sinus is  likely epistaxis Soft tissues: Soft tissue swelling about the nose. No opaque foreign body. CT CERVICAL SPINE FINDINGS Alignment: No traumatic malalignment. Skull base and vertebrae: Negative for acute fracture Soft tissues and spinal canal: No prevertebral fluid or swelling. No visible canal hematoma. Disc levels: Lower cervical degenerative disc narrowing and ridging. Generalized mild cervical facet spurring. Upper chest: Right-sided pleural effusion, reference dedicated chest CT IMPRESSION: 1. Comminuted bilateral nasal arch fracture with mild depression. 2. Anterior and inferior nasal septum fracture. The fracture is nondisplaced but the septum is deviated to the left. 3. No evidence of intracranial or cervical spine injury. 4. Large arachnoid cyst in the right middle cranial fossa. Electronically Signed   By: Monte Fantasia M.D.   On: 10/10/2019 19:06   Ct Angio Chest Pe W And/or Wo Contrast  Result Date: 10/10/2019 CLINICAL DATA:  78 year old male with shortness of breath. EXAM: CT ANGIOGRAPHY CHEST WITH CONTRAST TECHNIQUE: Multidetector CT imaging of the chest was performed using the standard protocol during bolus administration of intravenous contrast. Multiplanar CT image reconstructions and MIPs were obtained to evaluate the vascular anatomy. CONTRAST:  139mL OMNIPAQUE IOHEXOL 350 MG/ML SOLN COMPARISON:  Chest radiograph dated 10/10/2019. FINDINGS: Cardiovascular: There is no cardiomegaly or pericardial effusion. Mild atherosclerotic calcification of thoracic aorta. No aneurysmal dilatation or dissection. Coronary vascular calcification noted. Evaluation of the pulmonary arteries is limited due to suboptimal opacification and timing of the contrast. No definite large or central pulmonary artery embolus identified. Mediastinum/Nodes: No hilar adenopathy. Evaluation however is limited due to consolidative changes of the right lung. Focal area of soft tissue density to the right of the trachea (series  9, image 182) most consistent with azygos vein. Top-normal subcarinal lymph nodes. There is a small hiatal hernia. The esophagus and the thyroid gland are grossly unremarkable. No mediastinal fluid collection. Lungs/Pleura: There is a large right pleural effusion with associated mass effect and compressive atelectasis of the majority the right middle lobe and complete consolidation of the right lower lobe. Pneumonia or underlying mass is not excluded. Clinical correlation and follow-up to resolution recommended. Several calcified granuloma noted in the left upper lobe. There is no pneumothorax. The central airways are patent. Mucus secretions noted in the upper trachea. Upper Abdomen: Partially visualized cystic lesion from the interpolar left kidney. Musculoskeletal: Osteopenia. Old lower thoracic compression changes. No acute osseous pathology. Review of the MIP images confirms the above findings. IMPRESSION: 1. No definite CT evidence of central pulmonary artery embolus. 2. Large right pleural effusion with associated mass effect and complete compressive atelectasis of the right lower lobe and atelectasis of the majority of the right middle lobe. Pneumonia or underlying mass is not excluded. Clinical correlation and follow-up to resolution recommended. 3. Aortic Atherosclerosis (ICD10-I70.0). Electronically Signed   By: Anner Crete M.D.   On: 10/10/2019 19:01   Ct Cervical Spine Wo Contrast  Result Date: 10/10/2019 CLINICAL DATA:  Fall with facial injury. EXAM: CT HEAD WITHOUT CONTRAST CT MAXILLOFACIAL WITHOUT CONTRAST CT CERVICAL SPINE WITHOUT CONTRAST TECHNIQUE: Multidetector CT imaging of the head, cervical spine, and maxillofacial structures were performed using the standard protocol without intravenous contrast. Multiplanar CT image reconstructions of the cervical spine and  maxillofacial structures were also generated. COMPARISON:  None. FINDINGS: CT HEAD FINDINGS Brain: No evidence of hemorrhage  or swelling. No acute infarct, mass, or hydrocephalus. CSF density mass in the right middle cranial fossa with local mass effect, up to 4.6 cm in transverse span and 5.5 cm craniocaudal-consistent with arachnoid cyst. Generalized brain atrophy. There is dural-based calcification along the right cerebral convexity, question sequela of remote subdural hemorrhage Vascular: Atherosclerotic calcification. Skull: No calvarial fracture. CT MAXILLOFACIAL FINDINGS Osseous: Bilateral nasal arch fracture with comminution and mild depression on the right. Gas is seen in the anterior nasal septum where there is subtle fracture. No orbital or ethmoid continuation is seen. The mandible is intact and located. Orbits: Bilateral cataract resection. No evidence of postseptal injury. Sinuses: Expansive frontal sinuses. High-density frothy material in the right frontal sinus and right nasal cavity/nasopharynx is presumably epistaxis. In the inferior right frontal sinus is likely epistaxis Soft tissues: Soft tissue swelling about the nose. No opaque foreign body. CT CERVICAL SPINE FINDINGS Alignment: No traumatic malalignment. Skull base and vertebrae: Negative for acute fracture Soft tissues and spinal canal: No prevertebral fluid or swelling. No visible canal hematoma. Disc levels: Lower cervical degenerative disc narrowing and ridging. Generalized mild cervical facet spurring. Upper chest: Right-sided pleural effusion, reference dedicated chest CT IMPRESSION: 1. Comminuted bilateral nasal arch fracture with mild depression. 2. Anterior and inferior nasal septum fracture. The fracture is nondisplaced but the septum is deviated to the left. 3. No evidence of intracranial or cervical spine injury. 4. Large arachnoid cyst in the right middle cranial fossa. Electronically Signed   By: Monte Fantasia M.D.   On: 10/10/2019 19:06   Dg Chest Port 1 View  Result Date: 10/10/2019 CLINICAL DATA:  Shortness of breath. EXAM: PORTABLE CHEST 1  VIEW COMPARISON:  None. FINDINGS: There is a large right pleural effusion with underlying opacity. Two calcified granulomata are seen in the left lung. The right hilum and right heart border are not well seen due to the right-sided effusion. Within visualized limits, the heart, hila, and mediastinum are unremarkable. No pneumothorax. No other acute abnormalities. IMPRESSION: There is a large right-sided pleural effusion with underlying opacity which is nonspecific. No other acute abnormalities. Recommend short-term follow-up imaging to ensure resolution. Electronically Signed   By: Dorise Bullion III M.D   On: 10/10/2019 17:39   Ct Maxillofacial Wo Cm  Result Date: 10/10/2019 CLINICAL DATA:  Fall with facial injury. EXAM: CT HEAD WITHOUT CONTRAST CT MAXILLOFACIAL WITHOUT CONTRAST CT CERVICAL SPINE WITHOUT CONTRAST TECHNIQUE: Multidetector CT imaging of the head, cervical spine, and maxillofacial structures were performed using the standard protocol without intravenous contrast. Multiplanar CT image reconstructions of the cervical spine and maxillofacial structures were also generated. COMPARISON:  None. FINDINGS: CT HEAD FINDINGS Brain: No evidence of hemorrhage or swelling. No acute infarct, mass, or hydrocephalus. CSF density mass in the right middle cranial fossa with local mass effect, up to 4.6 cm in transverse span and 5.5 cm craniocaudal-consistent with arachnoid cyst. Generalized brain atrophy. There is dural-based calcification along the right cerebral convexity, question sequela of remote subdural hemorrhage Vascular: Atherosclerotic calcification. Skull: No calvarial fracture. CT MAXILLOFACIAL FINDINGS Osseous: Bilateral nasal arch fracture with comminution and mild depression on the right. Gas is seen in the anterior nasal septum where there is subtle fracture. No orbital or ethmoid continuation is seen. The mandible is intact and located. Orbits: Bilateral cataract resection. No evidence of  postseptal injury. Sinuses: Expansive frontal sinuses. High-density frothy material in  the right frontal sinus and right nasal cavity/nasopharynx is presumably epistaxis. In the inferior right frontal sinus is likely epistaxis Soft tissues: Soft tissue swelling about the nose. No opaque foreign body. CT CERVICAL SPINE FINDINGS Alignment: No traumatic malalignment. Skull base and vertebrae: Negative for acute fracture Soft tissues and spinal canal: No prevertebral fluid or swelling. No visible canal hematoma. Disc levels: Lower cervical degenerative disc narrowing and ridging. Generalized mild cervical facet spurring. Upper chest: Right-sided pleural effusion, reference dedicated chest CT IMPRESSION: 1. Comminuted bilateral nasal arch fracture with mild depression. 2. Anterior and inferior nasal septum fracture. The fracture is nondisplaced but the septum is deviated to the left. 3. No evidence of intracranial or cervical spine injury. 4. Large arachnoid cyst in the right middle cranial fossa. Electronically Signed   By: Monte Fantasia M.D.   On: 10/10/2019 19:06    Procedures Procedures (including critical care time)  Medications Ordered in ED Medications  vancomycin (VANCOCIN) 1,750 mg in sodium chloride 0.9 % 500 mL IVPB (1,750 mg Intravenous New Bag/Given 10/10/19 1905)  ceFEPIme (MAXIPIME) 2 g in sodium chloride 0.9 % 100 mL IVPB (0 g Intravenous Stopped 10/10/19 1949)  enoxaparin (LOVENOX) injection 40 mg (has no administration in time range)  azithromycin (ZITHROMAX) 500 mg in sodium chloride 0.9 % 250 mL IVPB (has no administration in time range)  insulin aspart (novoLOG) injection 0-9 Units (has no administration in time range)  sodium chloride flush (NS) 0.9 % injection 3 mL (3 mLs Intravenous Given 10/10/19 1744)  acetaminophen (TYLENOL) tablet 1,000 mg (1,000 mg Oral Given 10/10/19 1744)  Tdap (BOOSTRIX) injection 0.5 mL (0.5 mLs Intramuscular Given 10/10/19 1742)  iohexol (OMNIPAQUE) 350  MG/ML injection 100 mL (100 mLs Intravenous Contrast Given 10/10/19 1845)     Initial Impression / Assessment and Plan / ED Course  I have reviewed the triage vital signs and the nursing notes.  Pertinent labs & imaging results that were available during my care of the patient were reviewed by me and considered in my medical decision making (see chart for details).  78 year old male presents to the ED for evaluation after fall with near syncopal episode.  Patient reports he was walking with his wife when he started to feel short of breath and then suddenly was falling he tried to catch himself but hit his face on the ground causing significant abrasions and deformity to the nose, no active epistaxis on arrival.  No other facial bony deformities.  Patient denies prodrome he has not been feeling lightheaded recently but does report that he has been having some shortness of breath with exertion and fatigue.  On arrival patient was found to be febrile to 102.4 and tachycardic, vitals otherwise normal.  Concern for potential Covid versus pneumonia, patient is not having any focal abdominal pain, vomiting or diarrhea to suggest abdominal etiology for symptoms, he denies urinary symptoms.  Covid labs and inflammatory markers ordered as well as basic labs, lactic acid, blood cultures, chest x-ray ordered as well as CTs of the head, C-spine and maxillofacial bones given patient's fall.  No other evidence of injury from fall.  Labs significant for leukocytosis of 17.5, lactic acid is not elevated, patient without significant electrolyte derangements, normal renal function, LFTs slightly elevated, patient without any focal right upper quadrant tenderness.  Patient's chest x-ray shows a large right-sided pleural effusion with question for some opacity within the lung.  Will get CT angio of the chest to better characterize pleural effusion and look  for potential infection or mass and rule out PE in the setting of  syncope.  Patient's inflammatory markers from Covid labs are elevated and patient has a D-dimer of 6.13.  Given leukocytosis with concern for potential infection on chest x-ray, patient started on broad-spectrum antibiotics, code sepsis called, patient does not meet criteria for severe sepsis, given concern for potential Covid will hold off on fluid bolus.  CTA of the chest shows no evidence of PE, there is a large right-sided pleural effusion with associated mass-effect and complete compressive atelectasis of the right lower lobe, pneumonia or mass cannot be excluded.  In the setting of fever and leukocytosis I favor pneumonia, and patient has already been given antibiotics.  CT of the head and C-spine are unremarkable, CT maxillofacial shows bilateral fractures of the nasal arch with mild depression as well as a septal fracture, no evidence of septal hematoma on exam.  Septum is deviated to the left.  Patient is not having any active epistaxis.  Case discussed with Dr. Maudie Mercury with [facility] who will see and admit patient for pleural effusion and likely associated pneumonia.  I also discussed case with Dr. Benjamine Mola with ENT reguarding nasal fractures, he reports there is nothing to be done immediately and he will see the patient in consult while he is admitted tomorrow.  Final Clinical Impressions(s) / ED Diagnoses   Final diagnoses:  Large pleural effusion  Sepsis without acute organ dysfunction, due to unspecified organism [facility])  Pneumonia of right lung due to infectious organism, unspecified part of lung  Closed fracture of nasal bone, initial encounter  Closed fracture of nasal septum, initial encounter    ED Discharge Orders    None       Janet Berlin 10/10/19 2319    Charlesetta Shanks, MD 10/26/19 0740

## 2019-10-11 ENCOUNTER — Inpatient Hospital Stay (HOSPITAL_COMMUNITY): Payer: Medicare HMO

## 2019-10-11 DIAGNOSIS — J869 Pyothorax without fistula: Principal | ICD-10-CM

## 2019-10-11 DIAGNOSIS — J90 Pleural effusion, not elsewhere classified: Secondary | ICD-10-CM | POA: Diagnosis not present

## 2019-10-11 DIAGNOSIS — J189 Pneumonia, unspecified organism: Secondary | ICD-10-CM

## 2019-10-11 DIAGNOSIS — A419 Sepsis, unspecified organism: Secondary | ICD-10-CM

## 2019-10-11 LAB — GRAM STAIN

## 2019-10-11 LAB — CK TOTAL AND CKMB (NOT AT ARMC)
CK, MB: 3.2 ng/mL (ref 0.5–5.0)
Relative Index: INVALID (ref 0.0–2.5)
Total CK: 71 U/L (ref 49–397)

## 2019-10-11 LAB — HEPATIC FUNCTION PANEL
ALT: 123 U/L — ABNORMAL HIGH (ref 0–44)
AST: 87 U/L — ABNORMAL HIGH (ref 15–41)
Albumin: 2 g/dL — ABNORMAL LOW (ref 3.5–5.0)
Alkaline Phosphatase: 114 U/L (ref 38–126)
Bilirubin, Direct: 0.3 mg/dL — ABNORMAL HIGH (ref 0.0–0.2)
Indirect Bilirubin: 0.7 mg/dL (ref 0.3–0.9)
Total Bilirubin: 1 mg/dL (ref 0.3–1.2)
Total Protein: 6.5 g/dL (ref 6.5–8.1)

## 2019-10-11 LAB — BASIC METABOLIC PANEL
Anion gap: 12 (ref 5–15)
BUN: 12 mg/dL (ref 8–23)
CO2: 25 mmol/L (ref 22–32)
Calcium: 8.6 mg/dL — ABNORMAL LOW (ref 8.9–10.3)
Chloride: 103 mmol/L (ref 98–111)
Creatinine, Ser: 0.81 mg/dL (ref 0.61–1.24)
GFR calc Af Amer: >60 mL/min (ref >60)
GFR calc non Af Amer: >60 mL/min (ref >60)
Glucose, Bld: 107 mg/dL — ABNORMAL HIGH (ref 70–99)
Potassium: 3.7 mmol/L (ref 3.5–5.1)
Sodium: 140 mmol/L (ref 135–145)

## 2019-10-11 LAB — LACTATE DEHYDROGENASE, PLEURAL OR PERITONEAL FLUID
LD, Fluid: 712 U/L — ABNORMAL HIGH (ref 3–23)
LD, Fluid: 617 U/L — ABNORMAL HIGH (ref 3–23)

## 2019-10-11 LAB — SEDIMENTATION RATE: Sed Rate: 105 mm/hr — ABNORMAL HIGH (ref 0–16)

## 2019-10-11 LAB — IRON AND TIBC
Iron: 11 ug/dL — ABNORMAL LOW (ref 45–182)
Saturation Ratios: 7 % — ABNORMAL LOW (ref 17.9–39.5)
TIBC: 168 ug/dL — ABNORMAL LOW (ref 250–450)
UIBC: 157 ug/dL

## 2019-10-11 LAB — HEPATITIS PANEL, ACUTE
HCV Ab: NONREACTIVE
Hep A IgM: NONREACTIVE
Hep B C IgM: NONREACTIVE
Hepatitis B Surface Ag: NONREACTIVE

## 2019-10-11 LAB — GLUCOSE, PLEURAL OR PERITONEAL FLUID
Glucose, Fluid: 66 mg/dL
Glucose, Fluid: 74 mg/dL

## 2019-10-11 LAB — URINE CULTURE: Culture: NO GROWTH

## 2019-10-11 LAB — CBC
HCT: 32.3 % — ABNORMAL LOW (ref 39.0–52.0)
Hemoglobin: 10.5 g/dL — ABNORMAL LOW (ref 13.0–17.0)
MCH: 27.6 pg (ref 26.0–34.0)
MCHC: 32.5 g/dL (ref 30.0–36.0)
MCV: 85 fL (ref 80.0–100.0)
Platelets: 317 10*3/uL (ref 150–400)
RBC: 3.8 MIL/uL — ABNORMAL LOW (ref 4.22–5.81)
RDW: 13.9 % (ref 11.5–15.5)
WBC: 14.8 10*3/uL — ABNORMAL HIGH (ref 4.0–10.5)
nRBC: 0 % (ref 0.0–0.2)

## 2019-10-11 LAB — BODY FLUID CELL COUNT WITH DIFFERENTIAL
Eos, Fluid: 0 %
Eos, Fluid: 0 %
Lymphs, Fluid: 11 %
Lymphs, Fluid: 18 %
Monocyte-Macrophage-Serous Fluid: 4 % — ABNORMAL LOW (ref 50–90)
Monocyte-Macrophage-Serous Fluid: 4 % — ABNORMAL LOW (ref 50–90)
Neutrophil Count, Fluid: 85 % — ABNORMAL HIGH (ref 0–25)
Neutrophil Count, Fluid: 78 % — ABNORMAL HIGH (ref 0–25)
Total Nucleated Cell Count, Fluid: 1670 cu mm — ABNORMAL HIGH (ref 0–1000)
Total Nucleated Cell Count, Fluid: 1988 cu mm — ABNORMAL HIGH (ref 0–1000)

## 2019-10-11 LAB — HIV ANTIBODY (ROUTINE TESTING W REFLEX): HIV Screen 4th Generation wRfx: NONREACTIVE

## 2019-10-11 LAB — PROTEIN, PLEURAL OR PERITONEAL FLUID
Total protein, fluid: 4.8 g/dL
Total protein, fluid: 4.7 g/dL

## 2019-10-11 LAB — VITAMIN B12: Vitamin B-12: 1231 pg/mL — ABNORMAL HIGH (ref 180–914)

## 2019-10-11 LAB — HEMOGLOBIN A1C
Hgb A1c MFr Bld: 7.1 % — ABNORMAL HIGH (ref 4.8–5.6)
Mean Plasma Glucose: 157.07 mg/dL

## 2019-10-11 LAB — LACTATE DEHYDROGENASE: LDH: 162 U/L (ref 98–192)

## 2019-10-11 LAB — GLUCOSE, CAPILLARY
Glucose-Capillary: 117 mg/dL — ABNORMAL HIGH (ref 70–99)
Glucose-Capillary: 127 mg/dL — ABNORMAL HIGH (ref 70–99)
Glucose-Capillary: 158 mg/dL — ABNORMAL HIGH (ref 70–99)
Glucose-Capillary: 198 mg/dL — ABNORMAL HIGH (ref 70–99)
Glucose-Capillary: 215 mg/dL — ABNORMAL HIGH (ref 70–99)
Glucose-Capillary: 237 mg/dL — ABNORMAL HIGH (ref 70–99)

## 2019-10-11 LAB — SARS CORONAVIRUS 2 (TAT 6-24 HRS): SARS Coronavirus 2: NEGATIVE

## 2019-10-11 LAB — AMYLASE, PLEURAL OR PERITONEAL FLUID: Amylase, Fluid: 53 U/L

## 2019-10-11 LAB — PROTEIN, TOTAL: Total Protein: 6.3 g/dL — ABNORMAL LOW (ref 6.5–8.1)

## 2019-10-11 LAB — BRAIN NATRIURETIC PEPTIDE: B Natriuretic Peptide: 84.4 pg/mL (ref 0.0–100.0)

## 2019-10-11 LAB — CBG MONITORING, ED: Glucose-Capillary: 108 mg/dL — ABNORMAL HIGH (ref 70–99)

## 2019-10-11 MED ORDER — SODIUM CHLORIDE (PF) 0.9 % IJ SOLN
10.0000 mg | Freq: Two times a day (BID) | INTRAMUSCULAR | Status: DC
  Administered 2019-10-11: 17:00:00 10 mg via INTRAPLEURAL
  Filled 2019-10-11 (×3): qty 10

## 2019-10-11 MED ORDER — SODIUM CHLORIDE 0.9 % IV SOLN
510.0000 mg | Freq: Once | INTRAVENOUS | Status: AC
  Administered 2019-10-11: 16:00:00 510 mg via INTRAVENOUS
  Filled 2019-10-11: qty 17

## 2019-10-11 MED ORDER — VANCOMYCIN HCL 10 G IV SOLR
2000.0000 mg | INTRAVENOUS | Status: DC
  Filled 2019-10-11: qty 2000

## 2019-10-11 MED ORDER — STERILE WATER FOR INJECTION IJ SOLN
5.0000 mg | Freq: Two times a day (BID) | RESPIRATORY_TRACT | Status: DC
  Administered 2019-10-11: 17:00:00 5 mg via INTRAPLEURAL
  Filled 2019-10-11 (×4): qty 5

## 2019-10-11 MED ORDER — LIDOCAINE HCL (PF) 1 % IJ SOLN
INTRAMUSCULAR | Status: AC
  Administered 2019-10-11: 12:00:00
  Filled 2019-10-11: qty 30

## 2019-10-11 MED ORDER — CHLORHEXIDINE GLUCONATE CLOTH 2 % EX PADS
6.0000 | MEDICATED_PAD | Freq: Every day | CUTANEOUS | Status: DC
  Administered 2019-10-11: 10:00:00 6 via TOPICAL

## 2019-10-11 NOTE — ED Notes (Signed)
ED TO INPATIENT HANDOFF REPORT  ED Nurse Name and Phone #:  [phone]  S Name/Age/Gender Frank Carlson 78 y.o. male Room/Bed: 011C/011C  Code Status   Code Status: Full Code  Home/SNF/Other Home Patient oriented to: self, place, time and situation Is this baseline? Yes   Triage Complete: Triage complete  Chief Complaint Fall, Head Lac  Triage Note Pt reports he was walking up a hill and became short of breath and dizzy. Pt then fell forwards. Has blood and swelling noted to facial bones, nose, mouth. Pt states he did not fully lose consciousness. Pt is diabetic. Denies chest pain.    Allergies Allergies  Allergen Reactions  . Bee Venom Other (See Comments)    Passed out    Level of Care/Admitting Diagnosis ED Disposition    ED Disposition Condition [place]: [facility] [[id number]]  Level of Care: Progressive [102]  Admit to Progressive based on following criteria: RESPIRATORY PROBLEMS hypoxemic/hypercapnic respiratory failure that is responsive to NIPPV (BiPAP) or High Flow Nasal Cannula (6-80 lpm). Frequent assessment/intervention, no > Q2 hrs < Q4 hrs, to maintain oxygenation and pulmonary hygiene.  Covid Evaluation: Confirmed COVID Negative  Diagnosis: CAP (community acquired pneumonia) [[id number]]  Admitting Physician: Jani Gravel [[id number]]  Attending Physician: Jani Gravel [phone]  Estimated length of stay: past midnight tomorrow  Certification:: I certify this patient will need inpatient services for at least 2 midnights  PT Class (Do Not Modify): Inpatient [101]  PT Acc Code (Do Not Modify): Private [1]       B Medical/Surgery History Past Medical History:  Diagnosis Date  . BPH (benign prostatic hypertrophy)   . COLONIC POLYPS, HX OF 03/15/2008  . 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 CONCUSSION WITH LOC OF 30 MINUTES OR LESS 11/01/2009  . ED (erectile dysfunction)   . HEARING LOSS, BILATERAL 11/01/2009   Wears hearing aids   . Hyperlipidemia   . JOINT STIFFNESS, HAND 10/28/2008  . Type II or unspecified type diabetes mellitus with neurological manifestations, not stated as uncontrolled(250.60) 1/15   Type 2   Past Surgical History:  Procedure Laterality Date  . CATARACT EXTRACTION Bilateral 07/2010   OD with IOL  . COLONOSCOPY W/ POLYPECTOMY    . INCISION / DRAINAGE HAND / FINGER Right   . INGUINAL HERNIA REPAIR Right 08/23/2015   Procedure: LAPAROSCOPIC RIGHT INGUINAL HERNIA REPAIR WITH MESH;  Surgeon: Ralene Ok, MD;  Location: [facility];  Service: General;  Laterality: Right;  . INSERTION OF MESH Right 08/23/2015   Procedure: INSERTION OF MESH;  Surgeon: Ralene Ok, MD;  Location: [facility];  Service: General;  Laterality: Right;  . TONSILLECTOMY    . VASECTOMY       A IV Location/Drains/Wounds Patient Lines/Drains/Airways Status   Active Line/Drains/Airways    Name:   Placement date:   Placement time:   Site:   Days:   Peripheral IV 10/10/19 Right Antecubital   10/10/19    1729    Antecubital   1   Peripheral IV 10/10/19 Right Wrist   10/10/19    1729    Wrist   1   Urethral Catheter T. Gerilyn Nestle, RN Double-lumen;Latex;Straight-tip 16 Fr.   08/23/15    1203    Double-lumen;Latex;Straight-tip   1510   Incision (Closed) 08/23/15 Abdomen   08/23/15    1300     1510          Intake/Output Last 24 hours  Intake/Output Summary (Last  24 hours) at 10/11/2019 0501 Last data filed at 10/10/2019 2234 Gross per 24 hour  Intake 603 ml  Output -  Net 603 ml    Labs/Imaging Results for orders placed or performed during the hospital encounter of 10/10/19 (from the past 48 hour(s))  CBG monitoring, ED     Status: Abnormal   Collection Time: 10/10/19  5:19 PM  Result Value Ref Range   Glucose-Capillary 152 (H) 70 - 99 mg/dL  Comprehensive metabolic panel     Status: Abnormal   Collection Time: 10/10/19  5:22 PM  Result Value Ref Range   Sodium 137 135 - 145 mmol/L   Potassium 3.9 3.5 - 5.1 mmol/L    Chloride 100 98 - 111 mmol/L   CO2 24 22 - 32 mmol/L   Glucose, Bld 160 (H) 70 - 99 mg/dL   BUN 13 8 - 23 mg/dL   Creatinine, Ser 0.96 0.61 - 1.24 mg/dL   Calcium 8.5 (L) 8.9 - 10.3 mg/dL   Total Protein 7.0 6.5 - 8.1 g/dL   Albumin 2.2 (L) 3.5 - 5.0 g/dL   AST 120 (H) 15 - 41 U/L   ALT 145 (H) 0 - 44 U/L   Alkaline Phosphatase 119 38 - 126 U/L   Total Bilirubin 1.6 (H) 0.3 - 1.2 mg/dL   GFR calc non Af Amer >60 >60 mL/min   GFR calc Af Amer >60 >60 mL/min   Anion gap 13 5 - 15    Comment: Performed at [facility], [address], [place] [employer] [place], [place] [address]  Ethanol     Status: None   Collection Time: 10/10/19  5:22 PM  Result Value Ref Range   Alcohol, Ethyl (B) <10 <10 mg/dL    Comment: (NOTE) Lowest detectable limit for serum alcohol is 10 mg/dL. For medical purposes only. Performed at [facility], [place] [address] [place], [place] [address]   Troponin I (High Sensitivity)     Status: None   Collection Time: 10/10/19  5:22 PM  Result Value Ref Range   Troponin I (High Sensitivity) 16 <18 ng/L    Comment: (NOTE) Elevated high sensitivity troponin I (hsTnI) values and significant  changes across serial measurements may suggest ACS but many other  chronic and acute conditions are known to elevate hsTnI results.  Refer to the "Links" section for chest pain algorithms and additional  guidance. Performed at [facility], [place] [address], [place], [place] [address]   Lactic acid, plasma     Status: None   Collection Time: 10/10/19  5:22 PM  Result Value Ref Range   Lactic Acid, Venous 1.9 0.5 - 1.9 mmol/L    Comment: Performed at [facility] [address] [place],  [address]  CBC with Differential     Status: Abnormal   Collection Time: 10/10/19  5:22 PM  Result Value Ref Range   WBC 17.5 (H) 4.0 - 10.5 K/uL   RBC 4.01 (L) 4.22 - 5.81 MIL/uL   Hemoglobin 10.9 (L) 13.0 - 17.0 g/dL   HCT 33.8 (L) 39.0 - 52.0 %   MCV 84.3 80.0 - 100.0  fL   MCH 27.2 26.0 - 34.0 pg   MCHC 32.2 30.0 - 36.0 g/dL   RDW 13.8 11.5 - 15.5 %   Platelets 305 150 - 400 K/uL   nRBC 0.0 0.0 - 0.2 %   Neutrophils Relative % 79 %   Neutro Abs 13.8 (H) 1.7 -  7.7 K/uL   Lymphocytes Relative 8 %   Lymphs Abs 1.4 0.7 - 4.0 K/uL   Monocytes Relative 12 %   Monocytes Absolute 2.2 (H) 0.1 - 1.0 K/uL   Eosinophils Relative 0 %   Eosinophils Absolute 0.0 0.0 - 0.5 K/uL   Basophils Relative 0 %   Basophils Absolute 0.0 0.0 - 0.1 K/uL   Immature Granulocytes 1 %   Abs Immature Granulocytes 0.10 (H) 0.00 - 0.07 K/uL    Comment: Performed at [facility] [address], [place], [place] [address]  Protime-INR     Status: None   Collection Time: 10/10/19  5:22 PM  Result Value Ref Range   Prothrombin Time 14.0 11.4 - 15.2 seconds   INR 1.1 0.8 - 1.2    Comment: (NOTE) INR goal varies based on device and disease states. Performed at [facility], [place] [address], [place], [place] [address]   D-dimer, quantitative     Status: Abnormal   Collection Time: 10/10/19  5:22 PM  Result Value Ref Range   D-Dimer, Quant 6.13 (H) 0.00 - 0.50 ug/mL-FEU    Comment: (NOTE) At the manufacturer cut-off of 0.50 ug/mL FEU, this assay has been documented to exclude PE with a sensitivity and negative predictive value of 97 to 99%.  At this time, this assay has not been approved by the FDA to exclude DVT/VTE. Results should be correlated with clinical presentation. Performed at [facility], [place] [address] [place], [place] [address]   Procalcitonin     Status: None   Collection Time: 10/10/19  5:22 PM  Result Value Ref Range   Procalcitonin 0.27 ng/mL    Comment:        Interpretation: PCT (Procalcitonin) <= 0.5 ng/mL: Systemic infection (sepsis) is not likely. Local bacterial infection is possible. (NOTE)       Sepsis PCT Algorithm           Lower Respiratory Tract                                      Infection PCT Algorithm     ----------------------------     ----------------------------         PCT < 0.25 ng/mL                PCT < 0.10 ng/mL         Strongly encourage             Strongly discourage   discontinuation of antibiotics    initiation of antibiotics    ----------------------------     -----------------------------       PCT 0.25 - 0.50 ng/mL            PCT 0.10 - 0.25 ng/mL               OR       >80% decrease in PCT            Discourage initiation of                                            antibiotics      Encourage discontinuation           of antibiotics    ----------------------------     -----------------------------  PCT >= 0.50 ng/mL              PCT 0.26 - 0.50 ng/mL               AND        <80% decrease in PCT             Encourage initiation of                                             antibiotics       Encourage continuation           of antibiotics    ----------------------------     -----------------------------        PCT >= 0.50 ng/mL                  PCT > 0.50 ng/mL               AND         increase in PCT                  Strongly encourage                                      initiation of antibiotics    Strongly encourage escalation           of antibiotics                                     -----------------------------                                           PCT <= 0.25 ng/mL                                                 OR                                        > 80% decrease in PCT                                     Discontinue / Do not initiate                                             antibiotics Performed at [facility], [address] [place], [place] [address]   Lactate dehydrogenase     Status: None   Collection Time: 10/10/19  5:22 PM  Result Value Ref Range   LDH 182 98 - 192 U/L    Comment: Performed at [facility], [place] [address], [place], [place] [address]  Ferritin  Status: Abnormal   Collection Time: 10/10/19   5:22 PM  Result Value Ref Range   Ferritin 557 (H) 24 - 336 ng/mL    Comment: Performed at [facility], [place] [address] [place], [place] [address]  Triglycerides     Status: None   Collection Time: 10/10/19  5:22 PM  Result Value Ref Range   Triglycerides 69 <150 mg/dL    Comment: Performed at [facility] [address], [place], [place] [address]  Fibrinogen     Status: Abnormal   Collection Time: 10/10/19  5:22 PM  Result Value Ref Range   Fibrinogen >800 (H) 210 - 475 mg/dL    Comment: Performed at [facility] [address] [place], [place] [address]  C-reactive protein     Status: Abnormal   Collection Time: 10/10/19  5:22 PM  Result Value Ref Range   CRP 20.9 (H) <1.0 mg/dL    Comment: Performed at [facility] [address] [place], [place] [address]  Urinalysis, Routine w reflex microscopic     Status: Abnormal   Collection Time: 10/10/19  6:08 PM  Result Value Ref Range   Color, Urine AMBER (A) YELLOW    Comment: BIOCHEMICALS MAY BE AFFECTED BY COLOR   APPearance HAZY (A) CLEAR   Specific Gravity, Urine 1.023 1.005 - 1.030   pH 5.0 5.0 - 8.0   Glucose, UA NEGATIVE NEGATIVE mg/dL   Hgb urine dipstick NEGATIVE NEGATIVE   Bilirubin Urine NEGATIVE NEGATIVE   Ketones, ur NEGATIVE NEGATIVE mg/dL   Protein, ur 30 (A) NEGATIVE mg/dL   Nitrite NEGATIVE NEGATIVE   Leukocytes,Ua NEGATIVE NEGATIVE   RBC / HPF 0-5 0 - 5 RBC/hpf   WBC, UA 0-5 0 - 5 WBC/hpf   Bacteria, UA NONE SEEN NONE SEEN   Squamous Epithelial / LPF 0-5 0 - 5   Mucus PRESENT    Ca Oxalate Crys, UA PRESENT     Comment: Performed at [facility], [place] [address], [place], [place] [address]  Strep pneumoniae urinary antigen     Status: None   Collection Time: 10/10/19  6:08 PM  Result Value Ref Range   Strep Pneumo Urinary Antigen NEGATIVE NEGATIVE    Comment:        Infection due to S. pneumoniae cannot be absolutely ruled out since the antigen present may be below  the detection limit of the test. Performed at [facility], [address], [place], [place] [address]   POC SARS Coronavirus 2 Ag-ED - Nasal Swab (BD Veritor Kit)     Status: None   Collection Time: 10/10/19  8:20 PM  Result Value Ref Range   SARS Coronavirus 2 Ag NEGATIVE NEGATIVE    Comment: (NOTE) SARS-CoV-2 antigen NOT DETECTED.  Negative results are presumptive.  Negative results do not preclude SARS-CoV-2 infection and should not be used as the sole basis for treatment or other patient management decisions, including infection  control decisions, particularly in the presence of clinical signs and  symptoms consistent with COVID-19, or in those who have been in contact with the virus.  Negative results must be combined with clinical observations, patient history, and epidemiological information. The expected result is Negative. Fact Sheet for Patients: PodPark.tn Fact Sheet for Healthcare Providers: GiftContent.is This test is not yet approved or cleared by the [place] FDA and  has been authorized for detection and/or diagnosis of SARS-CoV-2 by FDA under an  Emergency Use Authorization (EUA).  This EUA will remain in effect (meaning this test can be used) for the duration of  the COVID-19 de claration under Section 564(b)(1) of the Act, 21 U.S.C. section 360bbb-3(b)(1), unless the authorization is terminated or revoked sooner.   Lactic acid, plasma     Status: None   Collection Time: 10/10/19  9:16 PM  Result Value Ref Range   Lactic Acid, Venous 1.0 0.5 - 1.9 mmol/L    Comment: Performed at [facility] [address], [place], [place] [address]  Troponin I (High Sensitivity)     Status: Abnormal   Collection Time: 10/10/19  9:16 PM  Result Value Ref Range   Troponin I (High Sensitivity) 26 (H) <18 ng/L    Comment: (NOTE) Elevated high sensitivity troponin I (hsTnI) values and significant   changes across serial measurements may suggest ACS but many other  chronic and acute conditions are known to elevate hsTnI results.  Refer to the "Links" section for chest pain algorithms and additional  guidance. Performed at [facility], [place] [address] [place], [place] [address]   SARS CORONAVIRUS 2 (TAT 6-24 HRS) Nasopharyngeal Nasopharyngeal Swab     Status: None   Collection Time: 10/10/19  9:56 PM   Specimen: Nasopharyngeal Swab  Result Value Ref Range   SARS Coronavirus 2 NEGATIVE NEGATIVE    Comment: (NOTE) SARS-CoV-2 target nucleic acids are NOT DETECTED. The SARS-CoV-2 RNA is generally detectable in upper and lower respiratory specimens during the acute phase of infection. Negative results do not preclude SARS-CoV-2 infection, do not rule out co-infections with other pathogens, and should not be used as the sole basis for treatment or other patient management decisions. Negative results must be combined with clinical observations, patient history, and epidemiological information. The expected result is Negative. Fact Sheet for Patients: SugarRoll.be Fact Sheet for Healthcare Providers: https://www.woods-mathews.com/ This test is not yet approved or cleared by the [place] FDA and  has been authorized for detection and/or diagnosis of SARS-CoV-2 by FDA under an Emergency Use Authorization (EUA). This EUA will remain  in effect (meaning this test can be used) for the duration of the COVID-19 declaration under Section 56 4(b)(1) of the Act, 21 U.S.C. section 360bbb-3(b)(1), unless the authorization is terminated or revoked sooner. Performed at [facility], [place] [address] [place], [place] [address]   CBG monitoring, ED     Status: Abnormal   Collection Time: 10/10/19 10:38 PM  Result Value Ref Range   Glucose-Capillary 143 (H) 70 - 99 mg/dL  CBG monitoring, ED     Status: Abnormal   Collection Time:  10/10/19 11:43 PM  Result Value Ref Range   Glucose-Capillary 131 (H) 70 - 99 mg/dL  Basic metabolic panel     Status: Abnormal   Collection Time: 10/11/19  2:35 AM  Result Value Ref Range   Sodium 140 135 - 145 mmol/L   Potassium 3.7 3.5 - 5.1 mmol/L   Chloride 103 98 - 111 mmol/L   CO2 25 22 - 32 mmol/L   Glucose, Bld 107 (H) 70 - 99 mg/dL   BUN 12 8 - 23 mg/dL   Creatinine, Ser 0.81 0.61 - 1.24 mg/dL   Calcium 8.6 (L) 8.9 - 10.3 mg/dL   GFR calc non Af Amer >60 >60 mL/min   GFR calc Af Amer >60 >60 mL/min   Anion gap 12 5 - 15    Comment: Performed at [facility], [address] [place], [place] [address]  CBC     Status: Abnormal   Collection Time: 10/11/19  2:35 AM  Result Value Ref Range   WBC 14.8 (H) 4.0 - 10.5 K/uL   RBC 3.80 (L) 4.22 - 5.81 MIL/uL   Hemoglobin 10.5 (L) 13.0 - 17.0 g/dL   HCT 32.3 (L) 39.0 - 52.0 %   MCV 85.0 80.0 - 100.0 fL   MCH 27.6 26.0 - 34.0 pg   MCHC 32.5 30.0 - 36.0 g/dL   RDW 13.9 11.5 - 15.5 %   Platelets 317 150 - 400 K/uL   nRBC 0.0 0.0 - 0.2 %    Comment: Performed at [facility], [place] [address] [place], [place] [address]  Hepatic function panel     Status: Abnormal   Collection Time: 10/11/19  2:35 AM  Result Value Ref Range   Total Protein 6.5 6.5 - 8.1 g/dL   Albumin 2.0 (L) 3.5 - 5.0 g/dL   AST 87 (H) 15 - 41 U/L   ALT 123 (H) 0 - 44 U/L   Alkaline Phosphatase 114 38 - 126 U/L   Total Bilirubin 1.0 0.3 - 1.2 mg/dL   Bilirubin, Direct 0.3 (H) 0.0 - 0.2 mg/dL   Indirect Bilirubin 0.7 0.3 - 0.9 mg/dL    Comment: Performed at [facility] [address], [place], [place] [address]  CK total and CKMB (cardiac)not at [facility]     Status: None   Collection Time: 10/11/19  2:35 AM  Result Value Ref Range   Total CK 71 49 - 397 U/L   CK, MB 3.2 0.5 - 5.0 ng/mL   Relative Index RELATIVE INDEX IS INVALID 0.0 - 2.5    Comment: WHEN CK < 100 U/L        Performed at [facility] [address],  [place], [place] [address]   Iron and TIBC     Status: Abnormal   Collection Time: 10/11/19  2:35 AM  Result Value Ref Range   Iron 11 (L) 45 - 182 ug/dL   TIBC 168 (L) 250 - 450 ug/dL   Saturation Ratios 7 (L) 17.9 - 39.5 %   UIBC 157 ug/dL    Comment: Performed at [facility], [place] [address], [place], [place] [address]  Vitamin B12     Status: Abnormal   Collection Time: 10/11/19  2:35 AM  Result Value Ref Range   Vitamin B-12 1,231 (H) 180 - 914 pg/mL    Comment: (NOTE) This assay is not validated for testing neonatal or myeloproliferative syndrome specimens for Vitamin B12 levels. Performed at [facility], [place] [address] [place], [place] [address]   Sedimentation rate     Status: Abnormal   Collection Time: 10/11/19  2:35 AM  Result Value Ref Range   Sed Rate 105 (H) 0 - 16 mm/hr    Comment: Performed at [facility] [address], [place], [place] [address]  Hemoglobin A1c     Status: Abnormal   Collection Time: 10/11/19  2:35 AM  Result Value Ref Range   Hgb A1c MFr Bld 7.1 (H) 4.8 - 5.6 %    Comment: (NOTE) Pre diabetes:          5.7%-6.4% Diabetes:              >6.4% Glycemic control for   <7.0% adults with diabetes    Mean Plasma Glucose  157.07 mg/dL    Comment: Performed at [facility] [address], [place], [place] [address]  CBG monitoring, ED     Status: Abnormal   Collection Time: 10/11/19  4:14 AM  Result Value Ref Range   Glucose-Capillary 108 (H) 70 - 99 mg/dL   Ct Head Wo Contrast  Result Date: 10/10/2019 CLINICAL DATA:  Fall with facial injury. EXAM: CT HEAD WITHOUT CONTRAST CT MAXILLOFACIAL WITHOUT CONTRAST CT CERVICAL SPINE WITHOUT CONTRAST TECHNIQUE: Multidetector CT imaging of the head, cervical spine, and maxillofacial structures were performed using the standard protocol without intravenous contrast. Multiplanar CT image reconstructions of the cervical spine and maxillofacial structures were also generated. COMPARISON:   None. FINDINGS: CT HEAD FINDINGS Brain: No evidence of hemorrhage or swelling. No acute infarct, mass, or hydrocephalus. CSF density mass in the right middle cranial fossa with local mass effect, up to 4.6 cm in transverse span and 5.5 cm craniocaudal-consistent with arachnoid cyst. Generalized brain atrophy. There is dural-based calcification along the right cerebral convexity, question sequela of remote subdural hemorrhage Vascular: Atherosclerotic calcification. Skull: No calvarial fracture. CT MAXILLOFACIAL FINDINGS Osseous: Bilateral nasal arch fracture with comminution and mild depression on the right. Gas is seen in the anterior nasal septum where there is subtle fracture. No orbital or ethmoid continuation is seen. The mandible is intact and located. Orbits: Bilateral cataract resection. No evidence of postseptal injury. Sinuses: Expansive frontal sinuses. High-density frothy material in the right frontal sinus and right nasal cavity/nasopharynx is presumably epistaxis. In the inferior right frontal sinus is likely epistaxis Soft tissues: Soft tissue swelling about the nose. No opaque foreign body. CT CERVICAL SPINE FINDINGS Alignment: No traumatic malalignment. Skull base and vertebrae: Negative for acute fracture Soft tissues and spinal canal: No prevertebral fluid or swelling. No visible canal hematoma. Disc levels: Lower cervical degenerative disc narrowing and ridging. Generalized mild cervical facet spurring. Upper chest: Right-sided pleural effusion, reference dedicated chest CT IMPRESSION: 1. Comminuted bilateral nasal arch fracture with mild depression. 2. Anterior and inferior nasal septum fracture. The fracture is nondisplaced but the septum is deviated to the left. 3. No evidence of intracranial or cervical spine injury. 4. Large arachnoid cyst in the right middle cranial fossa. Electronically Signed   By: Monte Fantasia M.D.   On: 10/10/2019 19:06   Ct Angio Chest Pe W And/or Wo  Contrast  Result Date: 10/10/2019 CLINICAL DATA:  78 year old male with shortness of breath. EXAM: CT ANGIOGRAPHY CHEST WITH CONTRAST TECHNIQUE: Multidetector CT imaging of the chest was performed using the standard protocol during bolus administration of intravenous contrast. Multiplanar CT image reconstructions and MIPs were obtained to evaluate the vascular anatomy. CONTRAST:  127m OMNIPAQUE IOHEXOL 350 MG/ML SOLN COMPARISON:  Chest radiograph dated 10/10/2019. FINDINGS: Cardiovascular: There is no cardiomegaly or pericardial effusion. Mild atherosclerotic calcification of thoracic aorta. No aneurysmal dilatation or dissection. Coronary vascular calcification noted. Evaluation of the pulmonary arteries is limited due to suboptimal opacification and timing of the contrast. No definite large or central pulmonary artery embolus identified. Mediastinum/Nodes: No hilar adenopathy. Evaluation however is limited due to consolidative changes of the right lung. Focal area of soft tissue density to the right of the trachea (series 9, image 182) most consistent with azygos vein. Top-normal subcarinal lymph nodes. There is a small hiatal hernia. The esophagus and the thyroid gland are grossly unremarkable. No mediastinal fluid collection. Lungs/Pleura: There is a large right pleural effusion with associated mass effect and compressive atelectasis of the majority the  right middle lobe and complete consolidation of the right lower lobe. Pneumonia or underlying mass is not excluded. Clinical correlation and follow-up to resolution recommended. Several calcified granuloma noted in the left upper lobe. There is no pneumothorax. The central airways are patent. Mucus secretions noted in the upper trachea. Upper Abdomen: Partially visualized cystic lesion from the interpolar left kidney. Musculoskeletal: Osteopenia. Old lower thoracic compression changes. No acute osseous pathology. Review of the MIP images confirms the above  findings. IMPRESSION: 1. No definite CT evidence of central pulmonary artery embolus. 2. Large right pleural effusion with associated mass effect and complete compressive atelectasis of the right lower lobe and atelectasis of the majority of the right middle lobe. Pneumonia or underlying mass is not excluded. Clinical correlation and follow-up to resolution recommended. 3. Aortic Atherosclerosis (ICD10-I70.0). Electronically Signed   By: Anner Crete M.D.   On: 10/10/2019 19:01   Ct Cervical Spine Wo Contrast  Result Date: 10/10/2019 CLINICAL DATA:  Fall with facial injury. EXAM: CT HEAD WITHOUT CONTRAST CT MAXILLOFACIAL WITHOUT CONTRAST CT CERVICAL SPINE WITHOUT CONTRAST TECHNIQUE: Multidetector CT imaging of the head, cervical spine, and maxillofacial structures were performed using the standard protocol without intravenous contrast. Multiplanar CT image reconstructions of the cervical spine and maxillofacial structures were also generated. COMPARISON:  None. FINDINGS: CT HEAD FINDINGS Brain: No evidence of hemorrhage or swelling. No acute infarct, mass, or hydrocephalus. CSF density mass in the right middle cranial fossa with local mass effect, up to 4.6 cm in transverse span and 5.5 cm craniocaudal-consistent with arachnoid cyst. Generalized brain atrophy. There is dural-based calcification along the right cerebral convexity, question sequela of remote subdural hemorrhage Vascular: Atherosclerotic calcification. Skull: No calvarial fracture. CT MAXILLOFACIAL FINDINGS Osseous: Bilateral nasal arch fracture with comminution and mild depression on the right. Gas is seen in the anterior nasal septum where there is subtle fracture. No orbital or ethmoid continuation is seen. The mandible is intact and located. Orbits: Bilateral cataract resection. No evidence of postseptal injury. Sinuses: Expansive frontal sinuses. High-density frothy material in the right frontal sinus and right nasal cavity/nasopharynx is  presumably epistaxis. In the inferior right frontal sinus is likely epistaxis Soft tissues: Soft tissue swelling about the nose. No opaque foreign body. CT CERVICAL SPINE FINDINGS Alignment: No traumatic malalignment. Skull base and vertebrae: Negative for acute fracture Soft tissues and spinal canal: No prevertebral fluid or swelling. No visible canal hematoma. Disc levels: Lower cervical degenerative disc narrowing and ridging. Generalized mild cervical facet spurring. Upper chest: Right-sided pleural effusion, reference dedicated chest CT IMPRESSION: 1. Comminuted bilateral nasal arch fracture with mild depression. 2. Anterior and inferior nasal septum fracture. The fracture is nondisplaced but the septum is deviated to the left. 3. No evidence of intracranial or cervical spine injury. 4. Large arachnoid cyst in the right middle cranial fossa. Electronically Signed   By: Monte Fantasia M.D.   On: 10/10/2019 19:06   US Abdomen Complete  Result Date: 10/10/2019 CLINICAL DATA:  Abnormal liver function EXAM: ABDOMEN ULTRASOUND COMPLETE COMPARISON:  None. FINDINGS: Gallbladder: No gallstones or wall thickening visualized. No sonographic Murphy sign noted by sonographer. Common bile duct: Diameter: 3 mm Liver: No focal lesion identified. Within normal limits in parenchymal echogenicity. Portal vein is patent on color Doppler imaging with normal direction of blood flow towards the liver. IVC: No abnormality visualized. Pancreas: Limited visualization due to bowel gas. Spleen: Size and appearance within normal limits. Right Kidney: Length: 10.4 cm. Renal cyst measures 5.0 x 4.1 x  4.7 cm Left Kidney: Length: 10.9 cm. Renal cyst measures 9.0 x 8.1 x 9.2 cm Abdominal aorta: No aneurysm visualized. Other findings: None. IMPRESSION: Bilateral renal cysts.  Otherwise normal abdominal ultrasound. Electronically Signed   By: Ulyses Jarred M.D.   On: 10/10/2019 22:35   Dg Chest Port 1 View  Result Date:  10/10/2019 CLINICAL DATA:  Shortness of breath. EXAM: PORTABLE CHEST 1 VIEW COMPARISON:  None. FINDINGS: There is a large right pleural effusion with underlying opacity. Two calcified granulomata are seen in the left lung. The right hilum and right heart border are not well seen due to the right-sided effusion. Within visualized limits, the heart, hila, and mediastinum are unremarkable. No pneumothorax. No other acute abnormalities. IMPRESSION: There is a large right-sided pleural effusion with underlying opacity which is nonspecific. No other acute abnormalities. Recommend short-term follow-up imaging to ensure resolution. Electronically Signed   By: Dorise Bullion III M.D   On: 10/10/2019 17:39   Ct Maxillofacial Wo Cm  Result Date: 10/10/2019 CLINICAL DATA:  Fall with facial injury. EXAM: CT HEAD WITHOUT CONTRAST CT MAXILLOFACIAL WITHOUT CONTRAST CT CERVICAL SPINE WITHOUT CONTRAST TECHNIQUE: Multidetector CT imaging of the head, cervical spine, and maxillofacial structures were performed using the standard protocol without intravenous contrast. Multiplanar CT image reconstructions of the cervical spine and maxillofacial structures were also generated. COMPARISON:  None. FINDINGS: CT HEAD FINDINGS Brain: No evidence of hemorrhage or swelling. No acute infarct, mass, or hydrocephalus. CSF density mass in the right middle cranial fossa with local mass effect, up to 4.6 cm in transverse span and 5.5 cm craniocaudal-consistent with arachnoid cyst. Generalized brain atrophy. There is dural-based calcification along the right cerebral convexity, question sequela of remote subdural hemorrhage Vascular: Atherosclerotic calcification. Skull: No calvarial fracture. CT MAXILLOFACIAL FINDINGS Osseous: Bilateral nasal arch fracture with comminution and mild depression on the right. Gas is seen in the anterior nasal septum where there is subtle fracture. No orbital or ethmoid continuation is seen. The mandible is intact  and located. Orbits: Bilateral cataract resection. No evidence of postseptal injury. Sinuses: Expansive frontal sinuses. High-density frothy material in the right frontal sinus and right nasal cavity/nasopharynx is presumably epistaxis. In the inferior right frontal sinus is likely epistaxis Soft tissues: Soft tissue swelling about the nose. No opaque foreign body. CT CERVICAL SPINE FINDINGS Alignment: No traumatic malalignment. Skull base and vertebrae: Negative for acute fracture Soft tissues and spinal canal: No prevertebral fluid or swelling. No visible canal hematoma. Disc levels: Lower cervical degenerative disc narrowing and ridging. Generalized mild cervical facet spurring. Upper chest: Right-sided pleural effusion, reference dedicated chest CT IMPRESSION: 1. Comminuted bilateral nasal arch fracture with mild depression. 2. Anterior and inferior nasal septum fracture. The fracture is nondisplaced but the septum is deviated to the left. 3. No evidence of intracranial or cervical spine injury. 4. Large arachnoid cyst in the right middle cranial fossa. Electronically Signed   By: Monte Fantasia M.D.   On: 10/10/2019 19:06    Pending Labs Unresulted Labs (From admission, onward)    Start     Ordered   10/11/19 1610  Basic metabolic panel  Daily,   R     10/10/19 1818   10/11/19 0500  HIV Antibody (routine testing w rflx)  (HIV Antibody (Routine testing w reflex) panel)  Tomorrow morning,   R     10/10/19 2128   10/11/19 0500  Hepatitis panel, acute  Tomorrow morning,   R     10/10/19 2129  10/11/19 0500  Folate RBC  Tomorrow morning,   R     10/10/19 2130   10/10/19 2127  Influenza panel by PCR (type A & B)  (Influenza PCR Panel)  Add-on,   AD     10/10/19 2128   10/10/19 2127  Respiratory Panel by PCR  (Respiratory virus panel with precautions)  Add-on,   AD     10/10/19 2128   10/10/19 2127  Adenovirus antibodies  Once,   STAT     10/10/19 2128   10/10/19 2127  Bordetella pertussis PCR   Once,   STAT     10/10/19 2128   10/10/19 1808  Legionella Pneumophila Serogp 1 Ur Ag  Once,   STAT     10/10/19 1808   10/10/19 1652  Urine culture  ONCE - STAT,   STAT     10/10/19 1651   10/10/19 1652  Culture, blood (routine x 2)  BLOOD CULTURE X 2,   STAT     10/10/19 1651          Vitals/Pain Today's Vitals   10/11/19 0336 10/11/19 0345 10/11/19 0400 10/11/19 0421  BP: 132/73  (!) 141/69   Pulse: 75  66   Resp: (!) 22  15   Temp:      TempSrc:      SpO2: 94%  95%   Weight:    81.6 kg  Height:    6' 0.5" (1.842 m)  PainSc: 0-No pain 0-No pain      Isolation Precautions Droplet precaution  Medications Medications  vancomycin (VANCOCIN) 1,750 mg in sodium chloride 0.9 % 500 mL IVPB (0 mg Intravenous Stopped 10/10/19 2234)  ceFEPIme (MAXIPIME) 2 g in sodium chloride 0.9 % 100 mL IVPB (0 g Intravenous Stopped 10/11/19 0412)  enoxaparin (LOVENOX) injection 40 mg (40 mg Subcutaneous Given 10/10/19 2239)  azithromycin (ZITHROMAX) 500 mg in sodium chloride 0.9 % 250 mL IVPB (0 mg Intravenous Stopped 10/11/19 0013)  insulin aspart (novoLOG) injection 0-9 Units (0 Units Subcutaneous Not Given 10/11/19 0416)  feeding supplement (PRO-STAT SUGAR FREE 64) liquid 30 mL (has no administration in time range)  sodium chloride flush (NS) 0.9 % injection 3 mL (3 mLs Intravenous Given 10/10/19 1744)  acetaminophen (TYLENOL) tablet 1,000 mg (1,000 mg Oral Given 10/10/19 1744)  Tdap (BOOSTRIX) injection 0.5 mL (0.5 mLs Intramuscular Given 10/10/19 1742)  iohexol (OMNIPAQUE) 350 MG/ML injection 100 mL (100 mLs Intravenous Contrast Given 10/10/19 1845)    Mobility walks with person assist Moderate fall risk   Focused Assessments Pulmonary Assessment Handoff:  Lung sounds: L Breath Sounds: Clear R Breath Sounds: (S) Clear, Diminished O2 Device: Room Air        R Recommendations: See Admitting Provider Note  Report given to:   Additional Notes: -

## 2019-10-11 NOTE — Procedures (Addendum)
Chest Tube Insertion Procedure Note  Indications:  Clinically significant Empyema  Pre-operative Diagnosis: Empyema  Post-operative Diagnosis: Empyema  Procedure Details  Informed consent was obtained for the procedure, including sedation.  Risks of lung perforation, hemorrhage, arrhythmia, and adverse drug reaction were discussed.   Ultrasound was used to locate the large right sided loculated effusion. After sterile skin prep, using standard technique, a 14 French tube was placed in the right lateral 4th rib space.  Findings: Serous fluid drainage  Estimated Blood Loss:  Minimal         Specimens:  Sent serosanguinous fluid for cytology, cell coutn, protein, glucose, LDH, gram stain and culture              Complications:  None; patient tolerated the procedure well. Chest xray is pending.          Disposition: stable, in patient room.          Condition: stable  Plan: Patient will likely require intra-pleural fibrinolytic therapy. Recommend BID Dnase and TPA per MIST 2 Trial protocol.  Lenice Llamas, MD Pulmonary and [place] Pager: [place]

## 2019-10-11 NOTE — Progress Notes (Signed)
[facility] Progress Note Patient Name: Frank Carlson DOB: 27-Jul-1941 MRN: [id number]   Date of Service  10/11/2019  HPI/Events of Note  Pleural fluid drainage becoming more bloody than serosanguinous. BP = 130/71.  eICU Interventions  Will order: 1.  H/H STAT.     Intervention Category Major Interventions: Other:  Lysle Dingwall 10/11/2019, 11:53 PM

## 2019-10-11 NOTE — Consult Note (Signed)
NAME:  Frank Carlson, MRN:  [id number], DOB:  [birth year], LOS: 1 ADMISSION DATE:  10/10/2019, CONSULTATION DATE:  10/11/2019  REFERRING MD:  Domenic Polite, MD, CHIEF COMPLAINT:  Loculated pleural effusion  History of present illness   The patient is an 78 year old gentleman with past medical history of non-insulin-dependent diabetes and hyperlipidemia who presents after having a fall while walking in his neighborhood.  He was feeling short of breath while walking, and then fell forward.  He did sustain some abrasions to his nose and forehead with a small amount of bleeding.  He has a remote smoking history, quit over 60 years ago.  He denies having any pneumonia recently, but was having some nasal congestion and cough that was treated with doxycycline a few weeks ago.  He additionally reports having symptoms of cough earlier in the year around May or June which was attributed to mold.  He has not had any sick contacts, he has not had any Covid exposure.  He denies any fevers chills night sweats or weight loss.  His main issue is a dry cough and worsening dyspnea on exertion.  A chest x-ray demonstrated large right-sided loculated pleural effusion which was confirmed on CT chest.  He underwent ultrasound-guided thoracentesis in the IR with about 500 cc of serous fluid drained.  Pulmonary is being consulted to assist with management of the persistent loculated pleural effusion.  Objective   Blood pressure 107/84, pulse 85, temperature 98.4 F (36.9 C), temperature source Oral, resp. rate 14, height 6' 0.5" (1.842 m), weight 88 kg, SpO2 96 %.        Intake/Output Summary (Last 24 hours) at 10/11/2019 1127 Last data filed at 10/11/2019 1000 Gross per 24 hour  Intake 603 ml  Output 150 ml  Net 453 ml   Filed Weights   10/11/19 0421 10/11/19 0600  Weight: 81.6 kg 88 kg    Examination: General: awake, sitting up, multiple ecchymoses and abrasians on face HENT: nose is broken Lungs: absent  breath sounds on the right, clear to ausculation on the left, decreased chest wall rise on right Cardiovascular: RRR, no mrg Abdomen: scaphoid, soft, nt, nd Extremities: no edema Neuro: awake, alert, oriented, no distress. Normal speech, HOH MSK: no acute synovitis Lines/Tubes: PIV  Assessment & Plan:   Frank Carlson is an 78 year old gentleman history of diabetes and hyperlipidemia who presents with:   1. Loculated pleural effusion, likely empyema  This loculated pleural effusion is most likely an empyema in the setting of her previous community-acquired pneumonia.  This is not amenable to thoracentesis drainage, given its many loculations.  He will require a ultrasound-guided chest tube placement, possibly with intrapleural fibrinolytics.  Will place at the bedside today.  He is currently on azithromycin and cefepime for antibiotics.  He does not have any hospital-acquired organism risk factors.  CAP coverage is generally sufficient.  Pending the results of his pleural fluid studies, we may be able to narrow antibiotics.  Generally these are polymicrobial spaces, and a beta-lactam such as ampicillin sulbactam is sufficient if no culture data is positive.  Pulmonary will continue to follow.   Lenice Llamas, MD Pulmonary and [place] Pager: [phone]   CBC: Recent Labs  Lab 10/10/19 1722 10/11/19 0235  WBC 17.5* 14.8*  NEUTROABS 13.8*  --   HGB 10.9* 10.5*  HCT 33.8* 32.3*  MCV 84.3 85.0  PLT 305 [id number]    Basic Metabolic Panel: Recent Labs  Lab 10/10/19 1722 10/11/19 0235  NA 137 140  K 3.9 3.7  CL 100 103  CO2 24 25  GLUCOSE 160* 107*  BUN 13 12  CREATININE 0.96 0.81  CALCIUM 8.5* 8.6*   GFR: Estimated Creatinine Clearance: 81.1 mL/min (by C-G formula based on SCr of 0.81 mg/dL). Recent Labs  Lab 10/10/19 1722 10/10/19 2116 10/11/19 0235  PROCALCITON 0.27  --   --   WBC 17.5*  --  14.8*   LATICACIDVEN 1.9 1.0  --     Liver Function Tests: Recent Labs  Lab 10/10/19 1722 10/11/19 0235  AST 120* 87*  ALT 145* 123*  ALKPHOS 119 114  BILITOT 1.6* 1.0  PROT 7.0 6.3*  6.5  ALBUMIN 2.2* 2.0*   No results for input(s): LIPASE, AMYLASE in the last 168 hours. No results for input(s): AMMONIA in the last 168 hours.  ABG    Component Value Date/Time   TCO2 28 06/15/2015 1520     Coagulation Profile: Recent Labs  Lab 10/10/19 1722  INR 1.1    Cardiac Enzymes: Recent Labs  Lab 10/11/19 0235  CKTOTAL 71  CKMB 3.2    HbA1C: Hgb A1c MFr Bld  Date/Time Value Ref Range Status  10/11/2019 02:35 AM 7.1 (H) 4.8 - 5.6 % Final    Comment:    (NOTE) Pre diabetes:          5.7%-6.4% Diabetes:              >6.4% Glycemic control for   <7.0% adults with diabetes   04/28/2019 09:12 AM 6.5 4.6 - 6.5 % Final    Comment:    Glycemic Control Guidelines for People with Diabetes:Non Diabetic:  <6%Goal of Therapy: <7%Additional Action Suggested:  >8%     CBG: Recent Labs  Lab 10/10/19 1719 10/10/19 2238 10/10/19 2343 10/11/19 0414 10/11/19 0834  GLUCAP 152* 143* 131* 108* 117*    Review of Systems:   Review of Systems  Constitutional: Negative for chills, diaphoresis, fever, malaise/fatigue and weight loss.  HENT: Negative for congestion, sinus pain and sore throat.   Eyes: Negative for discharge and redness.  Respiratory: Positive for cough and shortness of breath. Negative for sputum production and wheezing.   Cardiovascular: Negative for chest pain, palpitations and leg swelling.  Gastrointestinal: Negative for heartburn, nausea and vomiting.  Musculoskeletal: Negative for joint pain and myalgias.  Skin: Negative for rash.  Neurological: Positive for loss of consciousness. Negative for dizziness, tremors, focal weakness and headaches.  Endo/Heme/Allergies: Positive for environmental allergies.  Psychiatric/Behavioral: Negative for depression. The patient  is not nervous/anxious.   All other systems reviewed and are negative.    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   Surgical History    Past Surgical History:  Procedure Laterality Date  . CATARACT EXTRACTION Bilateral 07/2010   OD with IOL  . COLONOSCOPY W/ POLYPECTOMY    . INCISION / DRAINAGE HAND / FINGER Right   . INGUINAL HERNIA REPAIR Right 08/23/2015   Procedure: LAPAROSCOPIC RIGHT INGUINAL HERNIA REPAIR WITH MESH;  Surgeon: Ralene Ok, MD;  Location: [facility];  Service: General;  Laterality: Right;  . INSERTION OF MESH Right 08/23/2015  Procedure: INSERTION OF MESH;  Surgeon: Ralene Ok, MD;  Location: [facility];  Service: General;  Laterality: Right;  . TONSILLECTOMY    . VASECTOMY       Social History   reports that he quit smoking about 60 years ago. His smoking use included cigarettes. He has a 10.00 pack-year smoking history. He has never used smokeless tobacco. He reports that he does not drink alcohol or use drugs.   Family History   His family history includes Alzheimer's disease in his brother, mother, and sister; Coronary artery disease in his father and another family member; Dementia in his brother and mother; Diabetes in an other family member; Heart disease in his father.   Allergies Allergies  Allergen Reactions  . Bee Venom Other (See Comments)    Passed out     Home Medications  Prior to Admission medications   Medication Sig Start Date End Date Taking? Authorizing Provider  acetaminophen (TYLENOL) 325 MG tablet Take 325 mg by mouth every 6  (six) hours as needed for headache (pain).   Yes [provider]  Calcium Carb-Ergocalciferol (CHEWABLE CALCIUM/D PO) Take 1 tablet by mouth at bedtime.    Yes [provider]  metFORMIN (GLUCOPHAGE) 500 MG tablet Take 1 tablet (500 mg total) by mouth 2 (two) times daily with a meal. 10/05/19  Yes Venia Carbon, MD  Multiple Vitamin (MULTIVITAMIN WITH MINERALS) TABS tablet Take 1 tablet by mouth every morning.    Yes [provider]  Omega-3 Fatty Acids (FISH OIL PO) Take 1 capsule by mouth every morning.    Yes [provider]  simvastatin (ZOCOR) 20 MG tablet TAKE 1 TABLET (20 MG TOTAL) BY MOUTH AT BEDTIME. 06/22/19  Yes Venia Carbon, MD  Alcohol Swabs PADS 1 Package by Does not apply route as needed. 12/03/16   Venia Carbon, MD  glucose blood (TRUE METRIX BLOOD GLUCOSE TEST) test strip Use to test blood sugar once a day. Dx Code: E08.40 Patient taking differently: Use to test blood sugar twice a month  Dx Code: E08.40 12/03/16   Venia Carbon, MD  TRUEPLUS LANCETS 30G MISC 1 Units by Does not apply route daily. Use to check blood sugar once a day. Dx Code [id number] Patient taking differently: by Does not apply route See admin instructions. Use to check blood sugar twice a month. Dx Code [id number] 12/03/16   Venia Carbon, MD

## 2019-10-11 NOTE — Progress Notes (Signed)
Pharmacy Antibiotic Note  Frank Carlson is a 78 y.o. male admitted on 10/10/2019 with pneumonia.  Pharmacy has been consulted for Cefepime and Vancomcyin dosing.  Height: 6' 0.5" (184.2 cm) Weight: 194 lb 1.6 oz (88 kg) IBW/kg (Calculated) : 78.75  Temp (24hrs), Avg:99.3 F (37.4 C), Min:98.1 F (36.7 C), Max:102.4 F (39.1 C)  Recent Labs  Lab 10/10/19 1722 10/10/19 2116 10/11/19 0235  WBC 17.5*  --  14.8*  CREATININE 0.96  --  0.81  LATICACIDVEN 1.9 1.0  --     Estimated Creatinine Clearance: 81.1 mL/min (by C-G formula based on SCr of 0.81 mg/dL).    Allergies  Allergen Reactions  . Bee Venom Other (See Comments)    Passed out    Antimicrobials this admission: 11/28 Cefepime >>  11/28 Vancomcyin >>  11/28 Azithromycin >>  Dose adjustments this admission: N/a  Microbiology results: 11/28 BCx: Pending  Assessment: Patient is an 52 yom that presented to the ED after a syncopal episode this evening. The patient was recently treated with Doxy for PNA. On xray patient appears to have PNA and pharmacy has been asked to dose cefepime and vancomycin for this patient.   WBC trending down at 14.8. Now afebrile. Renal function stable with Scr down to 0.81 today.  Plan: - Continue Cefepime 2 grams IV q8h  - Increase Vancomycin to 2000 mg IV q24h (est AUC 441, Scr 0.81) - Continue to monitor patient's renal function - Monitor cultures and sensitivities, clinical progression - Vancomycin levels when indicated  Frank Carlson, PharmD PGY1 Pharmacy Resident Phone: [phone] 10/11/2019  11:00 AM  Please check [email or web address] for unit-specific pharmacy phone numbers.

## 2019-10-11 NOTE — Progress Notes (Signed)
Pt arrived to the floor via the stretcher, alert and oriented and ambulated to the room. Stand up weight taken, VSS, heart monitor applied and CCMD notified. CHG bath applied, patient oriented to staff and equipment in the room. Bed exit alarm on, call bell within reach.

## 2019-10-11 NOTE — Procedures (Signed)
PROCEDURE SUMMARY:  Severely loculated (R) effusion with innumerable septations Successful US guided right thoracentesis. Yielded only 525 mL of clear yellow fluid. Pt tolerated procedure well. No immediate complications.  Specimen was sent for labs. CXR ordered.  EBL < 5 mL  Ascencion Dike PA-C 10/11/2019 10:58 AM

## 2019-10-11 NOTE — Consult Note (Signed)
Reason for Consult: Nasal fractures  HPI:  Frank Carlson is an 78 y.o. male a history of type 2 diabetes, hyperlipidemia, BPH, hearing loss, who presents to the emergency department yesterday for evaluation after a syncopal episode and fall. He reports that he and his wife were walking in the neighborhood when he suddenly felt like he was falling, he struck his face and his nose on the ground he has significant abrasions to the nose with a small amount of bleeding.  He reports he felt like he was going to lose consciousness but did not completely pass out, and did not lose consciousness upon striking his head on the ground.  Reports some bleeding from the nose afterwards.  Reports mild headache, denies vision changes, nausea, vomiting, numbness or weakness. Denies any known sick contacts.  No other aggravating or alleviating factors. His facial CT scan showed bilateral nasal and septal fractures. No significant displacement.  Past Medical History:  Diagnosis Date  . BPH (benign prostatic hypertrophy)   . COLONIC POLYPS, HX OF 03/15/2008  . 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 CONCUSSION WITH LOC OF 30 MINUTES OR LESS 11/01/2009  . ED (erectile dysfunction)   . HEARING LOSS, BILATERAL 11/01/2009   Wears hearing aids  . Hyperlipidemia   . JOINT STIFFNESS, HAND 10/28/2008  . Type II or unspecified type diabetes mellitus with neurological manifestations, not stated as uncontrolled(250.60) 1/15   Type 2    Past Surgical History:  Procedure Laterality Date  . CATARACT EXTRACTION Bilateral 07/2010   OD with IOL  . COLONOSCOPY W/ POLYPECTOMY    . INCISION / DRAINAGE HAND / FINGER Right   . INGUINAL HERNIA REPAIR Right 08/23/2015   Procedure: LAPAROSCOPIC RIGHT INGUINAL HERNIA REPAIR WITH MESH;  Surgeon: Ralene Ok, MD;  Location: [facility];  Service: General;  Laterality: Right;  . INSERTION OF MESH Right 08/23/2015   Procedure: INSERTION OF MESH;  Surgeon: Ralene Ok, MD;  Location: [place] [facility];   Service: General;  Laterality: Right;  . TONSILLECTOMY    . VASECTOMY      Family History  Problem Relation Age of Onset  . Alzheimer's disease Mother   . Dementia Mother   . Coronary artery disease Father   . Heart disease Father   . Coronary artery disease Other   . Diabetes Other   . Alzheimer's disease Sister   . Dementia Brother   . Alzheimer's disease Brother     Social History:  reports that he quit smoking about 60 years ago. His smoking use included cigarettes. He has a 10.00 pack-year smoking history. He has never used smokeless tobacco. He reports that he does not drink alcohol or use drugs.  Allergies:  Allergies  Allergen Reactions  . Bee Venom Other (See Comments)    Passed out    Prior to Admission medications   Medication Sig Start Date End Date Taking? Authorizing Provider  acetaminophen (TYLENOL) 325 MG tablet Take 325 mg by mouth every 6 (six) hours as needed for headache (pain).   Yes [provider]  Calcium Carb-Ergocalciferol (CHEWABLE CALCIUM/D PO) Take 1 tablet by mouth at bedtime.    Yes [provider]  metFORMIN (GLUCOPHAGE) 500 MG tablet Take 1 tablet (500 mg total) by mouth 2 (two) times daily with a meal. 10/05/19  Yes Venia Carbon, MD  Multiple Vitamin (MULTIVITAMIN WITH MINERALS) TABS tablet Take 1 tablet by mouth every morning.    Yes [provider]  Omega-3 Fatty Acids (FISH OIL  PO) Take 1 capsule by mouth every morning.    Yes [provider]  simvastatin (ZOCOR) 20 MG tablet TAKE 1 TABLET (20 MG TOTAL) BY MOUTH AT BEDTIME. 06/22/19  Yes Venia Carbon, MD  Alcohol Swabs PADS 1 Package by Does not apply route as needed. 12/03/16   Venia Carbon, MD  glucose blood (TRUE METRIX BLOOD GLUCOSE TEST) test strip Use to test blood sugar once a day. Dx Code: E08.40 Patient taking differently: Use to test blood sugar twice a month  Dx Code: E08.40 12/03/16   Venia Carbon, MD  TRUEPLUS LANCETS 30G MISC  1 Units by Does not apply route daily. Use to check blood sugar once a day. Dx Code [id number] Patient taking differently: by Does not apply route See admin instructions. Use to check blood sugar twice a month. Dx Code [id number] 12/03/16   Venia Carbon, MD    Medications:  I have reviewed the patient's current medications. Scheduled: . alteplase (TPA) for intrapleural administration  10 mg Intrapleural Q12H   And  . pulmozyme (DORNASE) for intrapleural administration  5 mg Intrapleural Q12H  . Chlorhexidine Gluconate Cloth  6 each Topical Daily  . enoxaparin (LOVENOX) injection  40 mg Subcutaneous Q24H  . feeding supplement (PRO-STAT SUGAR FREE 64)  30 mL Oral BID  . insulin aspart  0-9 Units Subcutaneous Q4H   Continuous: . azithromycin Stopped (10/11/19 0013)  . ceFEPime (MAXIPIME) IV 2 g (10/11/19 1808)     Dg Chest 1 View  Result Date: 10/11/2019 CLINICAL DATA:  RIGHT-sided pleural effusion status post thoracentesis. EXAM: CHEST  1 VIEW COMPARISON:  Chest x-ray dated 10/10/2019. RIGHT chest ultrasound from earlier today. FINDINGS: The large opacity within the RIGHT hemithorax is not appreciably changed in size compared to the earlier chest x-ray. LEFT lung remains clear. No pneumothorax seen. Heart size and mediastinal contours are stable. IMPRESSION: No significant change of the RIGHT lung opacity despite interval thoracentesis. No pneumothorax seen status post thoracentesis. Electronically Signed   By: Franki Cabot M.D.   On: 10/11/2019 11:15   Ct Head Wo Contrast  Result Date: 10/10/2019 CLINICAL DATA:  Fall with facial injury. EXAM: CT HEAD WITHOUT CONTRAST CT MAXILLOFACIAL WITHOUT CONTRAST CT CERVICAL SPINE WITHOUT CONTRAST TECHNIQUE: Multidetector CT imaging of the head, cervical spine, and maxillofacial structures were performed using the standard protocol without intravenous contrast. Multiplanar CT image reconstructions of the cervical spine and maxillofacial structures were  also generated. COMPARISON:  None. FINDINGS: CT HEAD FINDINGS Brain: No evidence of hemorrhage or swelling. No acute infarct, mass, or hydrocephalus. CSF density mass in the right middle cranial fossa with local mass effect, up to 4.6 cm in transverse span and 5.5 cm craniocaudal-consistent with arachnoid cyst. Generalized brain atrophy. There is dural-based calcification along the right cerebral convexity, question sequela of remote subdural hemorrhage Vascular: Atherosclerotic calcification. Skull: No calvarial fracture. CT MAXILLOFACIAL FINDINGS Osseous: Bilateral nasal arch fracture with comminution and mild depression on the right. Gas is seen in the anterior nasal septum where there is subtle fracture. No orbital or ethmoid continuation is seen. The mandible is intact and located. Orbits: Bilateral cataract resection. No evidence of postseptal injury. Sinuses: Expansive frontal sinuses. High-density frothy material in the right frontal sinus and right nasal cavity/nasopharynx is presumably epistaxis. In the inferior right frontal sinus is likely epistaxis Soft tissues: Soft tissue swelling about the nose. No opaque foreign body. CT CERVICAL SPINE FINDINGS Alignment: No traumatic malalignment. Skull base and vertebrae: Negative  for acute fracture Soft tissues and spinal canal: No prevertebral fluid or swelling. No visible canal hematoma. Disc levels: Lower cervical degenerative disc narrowing and ridging. Generalized mild cervical facet spurring. Upper chest: Right-sided pleural effusion, reference dedicated chest CT IMPRESSION: 1. Comminuted bilateral nasal arch fracture with mild depression. 2. Anterior and inferior nasal septum fracture. The fracture is nondisplaced but the septum is deviated to the left. 3. No evidence of intracranial or cervical spine injury. 4. Large arachnoid cyst in the right middle cranial fossa. Electronically Signed   By: Monte Fantasia M.D.   On: 10/10/2019 19:06   Ct Angio Chest  Pe W And/or Wo Contrast  Result Date: 10/10/2019 CLINICAL DATA:  78 year old male with shortness of breath. EXAM: CT ANGIOGRAPHY CHEST WITH CONTRAST TECHNIQUE: Multidetector CT imaging of the chest was performed using the standard protocol during bolus administration of intravenous contrast. Multiplanar CT image reconstructions and MIPs were obtained to evaluate the vascular anatomy. CONTRAST:  132mL OMNIPAQUE IOHEXOL 350 MG/ML SOLN COMPARISON:  Chest radiograph dated 10/10/2019. FINDINGS: Cardiovascular: There is no cardiomegaly or pericardial effusion. Mild atherosclerotic calcification of thoracic aorta. No aneurysmal dilatation or dissection. Coronary vascular calcification noted. Evaluation of the pulmonary arteries is limited due to suboptimal opacification and timing of the contrast. No definite large or central pulmonary artery embolus identified. Mediastinum/Nodes: No hilar adenopathy. Evaluation however is limited due to consolidative changes of the right lung. Focal area of soft tissue density to the right of the trachea (series 9, image 182) most consistent with azygos vein. Top-normal subcarinal lymph nodes. There is a small hiatal hernia. The esophagus and the thyroid gland are grossly unremarkable. No mediastinal fluid collection. Lungs/Pleura: There is a large right pleural effusion with associated mass effect and compressive atelectasis of the majority the right middle lobe and complete consolidation of the right lower lobe. Pneumonia or underlying mass is not excluded. Clinical correlation and follow-up to resolution recommended. Several calcified granuloma noted in the left upper lobe. There is no pneumothorax. The central airways are patent. Mucus secretions noted in the upper trachea. Upper Abdomen: Partially visualized cystic lesion from the interpolar left kidney. Musculoskeletal: Osteopenia. Old lower thoracic compression changes. No acute osseous pathology. Review of the MIP images  confirms the above findings. IMPRESSION: 1. No definite CT evidence of central pulmonary artery embolus. 2. Large right pleural effusion with associated mass effect and complete compressive atelectasis of the right lower lobe and atelectasis of the majority of the right middle lobe. Pneumonia or underlying mass is not excluded. Clinical correlation and follow-up to resolution recommended. 3. Aortic Atherosclerosis (ICD10-I70.0). Electronically Signed   By: Anner Crete M.D.   On: 10/10/2019 19:01   Ct Cervical Spine Wo Contrast  Result Date: 10/10/2019 CLINICAL DATA:  Fall with facial injury. EXAM: CT HEAD WITHOUT CONTRAST CT MAXILLOFACIAL WITHOUT CONTRAST CT CERVICAL SPINE WITHOUT CONTRAST TECHNIQUE: Multidetector CT imaging of the head, cervical spine, and maxillofacial structures were performed using the standard protocol without intravenous contrast. Multiplanar CT image reconstructions of the cervical spine and maxillofacial structures were also generated. COMPARISON:  None. FINDINGS: CT HEAD FINDINGS Brain: No evidence of hemorrhage or swelling. No acute infarct, mass, or hydrocephalus. CSF density mass in the right middle cranial fossa with local mass effect, up to 4.6 cm in transverse span and 5.5 cm craniocaudal-consistent with arachnoid cyst. Generalized brain atrophy. There is dural-based calcification along the right cerebral convexity, question sequela of remote subdural hemorrhage Vascular: Atherosclerotic calcification. Skull: No calvarial fracture. CT MAXILLOFACIAL  FINDINGS Osseous: Bilateral nasal arch fracture with comminution and mild depression on the right. Gas is seen in the anterior nasal septum where there is subtle fracture. No orbital or ethmoid continuation is seen. The mandible is intact and located. Orbits: Bilateral cataract resection. No evidence of postseptal injury. Sinuses: Expansive frontal sinuses. High-density frothy material in the right frontal sinus and right nasal  cavity/nasopharynx is presumably epistaxis. In the inferior right frontal sinus is likely epistaxis Soft tissues: Soft tissue swelling about the nose. No opaque foreign body. CT CERVICAL SPINE FINDINGS Alignment: No traumatic malalignment. Skull base and vertebrae: Negative for acute fracture Soft tissues and spinal canal: No prevertebral fluid or swelling. No visible canal hematoma. Disc levels: Lower cervical degenerative disc narrowing and ridging. Generalized mild cervical facet spurring. Upper chest: Right-sided pleural effusion, reference dedicated chest CT IMPRESSION: 1. Comminuted bilateral nasal arch fracture with mild depression. 2. Anterior and inferior nasal septum fracture. The fracture is nondisplaced but the septum is deviated to the left. 3. No evidence of intracranial or cervical spine injury. 4. Large arachnoid cyst in the right middle cranial fossa. Electronically Signed   By: Monte Fantasia M.D.   On: 10/10/2019 19:06   US Abdomen Complete  Result Date: 10/10/2019 CLINICAL DATA:  Abnormal liver function EXAM: ABDOMEN ULTRASOUND COMPLETE COMPARISON:  None. FINDINGS: Gallbladder: No gallstones or wall thickening visualized. No sonographic Murphy sign noted by sonographer. Common bile duct: Diameter: 3 mm Liver: No focal lesion identified. Within normal limits in parenchymal echogenicity. Portal vein is patent on color Doppler imaging with normal direction of blood flow towards the liver. IVC: No abnormality visualized. Pancreas: Limited visualization due to bowel gas. Spleen: Size and appearance within normal limits. Right Kidney: Length: 10.4 cm. Renal cyst measures 5.0 x 4.1 x 4.7 cm Left Kidney: Length: 10.9 cm. Renal cyst measures 9.0 x 8.1 x 9.2 cm Abdominal aorta: No aneurysm visualized. Other findings: None. IMPRESSION: Bilateral renal cysts.  Otherwise normal abdominal ultrasound. Electronically Signed   By: Ulyses Jarred M.D.   On: 10/10/2019 22:35   Portable Chest  Result Date:  10/11/2019 CLINICAL DATA:  Chest tube placement EXAM: PORTABLE CHEST 1 VIEW COMPARISON:  October 11, 2019 FINDINGS: A right chest tube is been placed in the interval. A moderate right pleural effusion with underlying opacity remains. No pneumothorax. Granuloma are seen in the left lung. No other changes. IMPRESSION: A right chest tube has been placed in the interval, in good position. A moderate right effusion with underlying opacity remains. No pneumothorax. Electronically Signed   By: Dorise Bullion III M.D   On: 10/11/2019 15:17   Dg Chest Port 1 View  Result Date: 10/10/2019 CLINICAL DATA:  Shortness of breath. EXAM: PORTABLE CHEST 1 VIEW COMPARISON:  None. FINDINGS: There is a large right pleural effusion with underlying opacity. Two calcified granulomata are seen in the left lung. The right hilum and right heart border are not well seen due to the right-sided effusion. Within visualized limits, the heart, hila, and mediastinum are unremarkable. No pneumothorax. No other acute abnormalities. IMPRESSION: There is a large right-sided pleural effusion with underlying opacity which is nonspecific. No other acute abnormalities. Recommend short-term follow-up imaging to ensure resolution. Electronically Signed   By: Dorise Bullion III M.D   On: 10/10/2019 17:39   Ct Maxillofacial Wo Cm  Result Date: 10/10/2019 CLINICAL DATA:  Fall with facial injury. EXAM: CT HEAD WITHOUT CONTRAST CT MAXILLOFACIAL WITHOUT CONTRAST CT CERVICAL SPINE WITHOUT CONTRAST TECHNIQUE: Multidetector CT  imaging of the head, cervical spine, and maxillofacial structures were performed using the standard protocol without intravenous contrast. Multiplanar CT image reconstructions of the cervical spine and maxillofacial structures were also generated. COMPARISON:  None. FINDINGS: CT HEAD FINDINGS Brain: No evidence of hemorrhage or swelling. No acute infarct, mass, or hydrocephalus. CSF density mass in the right middle cranial fossa  with local mass effect, up to 4.6 cm in transverse span and 5.5 cm craniocaudal-consistent with arachnoid cyst. Generalized brain atrophy. There is dural-based calcification along the right cerebral convexity, question sequela of remote subdural hemorrhage Vascular: Atherosclerotic calcification. Skull: No calvarial fracture. CT MAXILLOFACIAL FINDINGS Osseous: Bilateral nasal arch fracture with comminution and mild depression on the right. Gas is seen in the anterior nasal septum where there is subtle fracture. No orbital or ethmoid continuation is seen. The mandible is intact and located. Orbits: Bilateral cataract resection. No evidence of postseptal injury. Sinuses: Expansive frontal sinuses. High-density frothy material in the right frontal sinus and right nasal cavity/nasopharynx is presumably epistaxis. In the inferior right frontal sinus is likely epistaxis Soft tissues: Soft tissue swelling about the nose. No opaque foreign body. CT CERVICAL SPINE FINDINGS Alignment: No traumatic malalignment. Skull base and vertebrae: Negative for acute fracture Soft tissues and spinal canal: No prevertebral fluid or swelling. No visible canal hematoma. Disc levels: Lower cervical degenerative disc narrowing and ridging. Generalized mild cervical facet spurring. Upper chest: Right-sided pleural effusion, reference dedicated chest CT IMPRESSION: 1. Comminuted bilateral nasal arch fracture with mild depression. 2. Anterior and inferior nasal septum fracture. The fracture is nondisplaced but the septum is deviated to the left. 3. No evidence of intracranial or cervical spine injury. 4. Large arachnoid cyst in the right middle cranial fossa. Electronically Signed   By: Monte Fantasia M.D.   On: 10/10/2019 19:06   US Thoracentesis Asp Pleural Space W/img Guide  Result Date: 10/11/2019 INDICATION: Shortness of breath. Large right pleural effusion. Request for diagnostic and therapeutic thoracentesis. EXAM: ULTRASOUND GUIDED  RIGHT THORACENTESIS MEDICATIONS: None. COMPLICATIONS: None immediate. Postprocedural chest x-ray negative for pneumothorax. PROCEDURE: An ultrasound guided thoracentesis was thoroughly discussed with the patient and questions answered. The benefits, risks, alternatives and complications were also discussed. The patient understands and wishes to proceed with the procedure. Written consent was obtained. Ultrasound of the right chest demonstrates large but severely loculated effusion with innumerable septations. Ultrasound was performed to localize and mark an adequate pocket of fluid in the right chest. The area was then prepped and draped in the normal sterile fashion. 1% Lidocaine was used for local anesthesia. Under ultrasound guidance a 6 Fr Safe-T-Centesis catheter was introduced. Thoracentesis was performed. Despite repositioning of the catheter multiple times, only approximately 525 mL was able to be removed. The catheter was removed and a dressing applied. FINDINGS: A total of approximately 525 mL of clear yellow fluid was removed. Samples were sent to the laboratory as requested by the clinical team. IMPRESSION: Large right, severely loculated pleural effusion. Successful ultrasound guided right thoracentesis yielding 525 mL of pleural fluid. Read by: Ascencion Dike PA-C Electronically Signed   By: Aletta Edouard M.D.   On: 10/11/2019 11:35   Review of Systems  Constitutional: Positive for chills and fever.  HENT: Positive for congestion, nosebleeds and rhinorrhea. Negative for sore throat.   Eyes: Negative for pain and visual disturbance.  Respiratory: Positive for cough and shortness of breath.   Cardiovascular: Negative for chest pain.  Gastrointestinal: Negative for abdominal pain, diarrhea, nausea and vomiting.  Genitourinary: Negative for dysuria.  Musculoskeletal: Negative for arthralgias, myalgias and neck pain.  Skin: Positive for wound. Negative for color change.  Neurological: Positive  for syncope and light-headedness.  Negative for headaches.   Blood pressure 123/68, pulse 78, temperature 97.6 F (36.4 C), temperature source Oral, resp. rate 20, height 6' 0.5" (1.842 m), weight 88 kg, SpO2 95 %. Physical Exam: General: He is not in acute distress. Appearance: Normal appearance. He is well-developed and normal weight. He is not diaphoretic. Eyes: His pupils are equal, round, reactive to light. Extraocular motion is intact.  Ears: Examination of the ears shows normal auricles and external auditory canals bilaterally. Both tympanic membranes are intact.  Nose: Nasal examination shows dorsal abrasion and edema. No significant deformity. Significant nasal septal deviation, which is likely a pre-existing condition. Face: Facial examination shows no asymmetry. Palpation of the face elicit no significant tenderness.  Mouth: Oral cavity examination shows no mucosal lacerations. No significant trismus is noted.  Neck Palpation of the neck reveals no lymphadenopathy or mass. The trachea is midline.  Neuro: Cranial nerves 2-12 are all grossly in tact.  Assessment/Plan: Nasal and septal fractures s/p fall. - No significant deformity is noted today. - Pre-existing nasal septal deviation. - Likely will not need surgical intervention. - Patient may follow up with me as an outpatient. He may call [phone] for an appointment.  Kadeja Granada W Aquil Duhe 10/11/2019, 5:59 PM

## 2019-10-11 NOTE — Procedures (Signed)
Interpleural Fibrinolytic Therapy Installation Procedure Note:  Patient was placed in semi-recumbent position.  Stopcock on existing indwelling pleural catheter was cleaned and sterilized.  10 mg of alteplase and 5 mg of DNase were instilled slowly into the pleural space. The stopcock was closed in the off position towards the patient. The patient was instructed to alternate positions with in bed every 15 minutes for a total of 1 hour dwell time.  The nursing staff was instructed to open the stopcock and allow -20 cm water suction to the atrium after 1 hour of intrapleural fibrinolytic therapy dwell time.  Please notify physician if there is pleuritic chest pain or blood return into the atrium.   Garner Nash, DO [facility] 10/11/2019 4:55 PM

## 2019-10-11 NOTE — Progress Notes (Signed)
[facility] Progress Note Patient Name: Frank Carlson DOB: [birth year] MRN: [id number]   Date of Service  10/11/2019  HPI/Events of Note  Question about chest tube management.   eICU Interventions  Please place R chest tube to -20 cm suction.      Intervention Category Major Interventions: Other:  Lysle Dingwall 10/11/2019, 10:11 PM

## 2019-10-11 NOTE — Progress Notes (Addendum)
PROGRESS NOTE    Frank Carlson  [id number] DOB: February 13, 1941 DOA: 10/10/2019 PCP: Venia Carbon, MD  Brief Narrative: Frank Carlson is an 78 year old male with history of type 2 diabetes mellitus, BPH, dyslipidemia presented to the ED following a near syncopal episode. -He reports dyspnea on exertion for the last 1 month, associated with nonproductive cough, he was treated with doxycycline about 3 weeks ago with some temporary improvement in symptoms which subsequently worsened. Yesterday 11/28 he was walking uphill when he became acutely dyspneic and fell forward, denies any loss of consciousness. -In the emergency room he was febrile to 102, white count of 17,000, noted to have nasal arch fracture, CT chest noted large R effusion   Assessment & Plan:   Massive right pleural effusion -CTA noted compressive atelectasis in right lower and middle lobe, underlying pneumonia is a possibility, given fever of 102 on admission and leukocytosis -Suspect parapneumonic effusion, however given 60 pound weight loss in 6 to 8 months malignancy is also a consideration -Ultrasound-guided thoracentesis ordered by admitting MD -Continue cefepime and azithromycin, discontinue vancomycin -Follow-up pleural fluid Gram stain, culture, LDH, protein, pH, cytology ADDENDUM: back from [place] with 537ml of clear fluid drained but [place] concerning for loculations, unfortunately this was not reported on CT last night, will ask PCCM for input, ?chest tube  Nasal bridge fracture -ENT to see  Mildly elevated LFTs -Hold statin, bili is normal -Trend and monitor  Iron deficiency anemia -Will give IV iron x1 -Outpatient gastroenterology follow-up for colonoscopy  Type 2 diabetes mellitus -Metformin on hold, CBGs are stable, hemoglobin A1c 7.1  BPH  Renal cysts noted on imaging  DVT prophylaxis: Lovenox Code Status: Full code Family Communication: No family at bedside Disposition Plan: Home likely in 48  hours  Consultants:      Procedures:   Antimicrobials:    Subjective: Just about to go down to have thoracentesis, minimal discomfort at the nasal bridge fracture site, notes dyspnea on exertion ongoing for 3 to 4 weeks  Objective: Vitals:   10/11/19 0600 10/11/19 0836 10/11/19 1034 10/11/19 1050  BP: 138/70 140/75 130/65 107/84  Pulse: 83 85    Resp: (!) 24 14    Temp: 98.1 F (36.7 C) 98.4 F (36.9 C)    TempSrc: Oral Oral    SpO2: 98% 96%    Weight: 88 kg     Height: 6' 0.5" (1.842 m)       Intake/Output Summary (Last 24 hours) at 10/11/2019 1108 Last data filed at 10/11/2019 1000 Gross per 24 hour  Intake 603 ml  Output 150 ml  Net 453 ml   Filed Weights   10/11/19 0421 10/11/19 0600  Weight: 81.6 kg 88 kg    Examination:  General exam: Pleasant elderly male sitting up in bed AAOx3, no distress HEENT: Nasal bridge fracture with swelling and surrounding ecchymosis Respiratory system: Poor air movement on the right  cardiovascular system: S1 & S2 heard, RRR.  Gastrointestinal system: Abdomen is nondistended, soft and nontender.Normal bowel sounds heard. Central nervous system: Alert and oriented. No focal neurological deficits. Extremities: No edema Skin: No rashes, lesions or ulcers Psychiatry: Judgement and insight appear normal. Mood & affect appropriate.     Data Reviewed:   CBC: Recent Labs  Lab 10/10/19 1722 10/11/19 0235  WBC 17.5* 14.8*  NEUTROABS 13.8*  --   HGB 10.9* 10.5*  HCT 33.8* 32.3*  MCV 84.3 85.0  PLT 305 [id number]   Basic Metabolic Panel: Recent Labs  Lab 10/10/19 1722 10/11/19 0235  NA 137 140  K 3.9 3.7  CL 100 103  CO2 24 25  GLUCOSE 160* 107*  BUN 13 12  CREATININE 0.96 0.81  CALCIUM 8.5* 8.6*   GFR: Estimated Creatinine Clearance: 81.1 mL/min (by C-G formula based on SCr of 0.81 mg/dL). Liver Function Tests: Recent Labs  Lab 10/10/19 1722 10/11/19 0235  AST 120* 87*  ALT 145* 123*  ALKPHOS 119 114   BILITOT 1.6* 1.0  PROT 7.0 6.3*  6.5  ALBUMIN 2.2* 2.0*   No results for input(s): LIPASE, AMYLASE in the last 168 hours. No results for input(s): AMMONIA in the last 168 hours. Coagulation Profile: Recent Labs  Lab 10/10/19 1722  INR 1.1   Cardiac Enzymes: Recent Labs  Lab 10/11/19 0235  CKTOTAL 71  CKMB 3.2   BNP (last 3 results) No results for input(s): PROBNP in the last 8760 hours. HbA1C: Recent Labs    10/11/19 0235  HGBA1C 7.1*   CBG: Recent Labs  Lab 10/10/19 1719 10/10/19 2238 10/10/19 2343 10/11/19 0414 10/11/19 0834  GLUCAP 152* 143* 131* 108* 117*   Lipid Profile: Recent Labs    10/10/19 1722  TRIG 69   Thyroid Function Tests: No results for input(s): TSH, T4TOTAL, FREET4, T3FREE, THYROIDAB in the last 72 hours. Anemia Panel: Recent Labs    10/10/19 1722 10/11/19 0235  VITAMINB12  --  1,231*  FERRITIN 557*  --   TIBC  --  168*  IRON  --  11*   Urine analysis:    Component Value Date/Time   COLORURINE AMBER (A) 10/10/2019 1808   APPEARANCEUR HAZY (A) 10/10/2019 1808   LABSPEC 1.023 10/10/2019 1808   PHURINE 5.0 10/10/2019 1808   GLUCOSEU NEGATIVE 10/10/2019 1808   HGBUR NEGATIVE 10/10/2019 1808   HGBUR moderate 11/03/2009 0804   BILIRUBINUR NEGATIVE 10/10/2019 1808   BILIRUBINUR 2+ 11/16/2015 1227   KETONESUR NEGATIVE 10/10/2019 1808   PROTEINUR 30 (A) 10/10/2019 1808   UROBILINOGEN >=8.0 11/16/2015 1227   UROBILINOGEN 0.2 11/03/2009 0804   NITRITE NEGATIVE 10/10/2019 1808   LEUKOCYTESUR NEGATIVE 10/10/2019 1808   Sepsis Labs: @LABRCNTIP (procalcitonin:4,lacticidven:4)  ) Recent Results (from the past 240 hour(s))  Culture, blood (routine x 2)     Status: None (Preliminary result)   Collection Time: 10/10/19  5:15 PM   Specimen: BLOOD RIGHT ARM  Result Value Ref Range Status   Specimen Description BLOOD RIGHT ARM  Final   Special Requests   Final    BOTTLES DRAWN AEROBIC AND ANAEROBIC Blood Culture adequate volume    Culture   Final    NO GROWTH < 24 HOURS Performed at [facility], [place] [address] [place], [place] [address]    Report Status PENDING  Incomplete  Culture, blood (routine x 2)     Status: None (Preliminary result)   Collection Time: 10/10/19  5:22 PM   Specimen: BLOOD  Result Value Ref Range Status   Specimen Description BLOOD RIGHT ANTECUBITAL  Final   Special Requests   Final    BOTTLES DRAWN AEROBIC AND ANAEROBIC Blood Culture adequate volume   Culture   Final    NO GROWTH < 24 HOURS Performed at [facility], [place] [address], [place], [place] [address]    Report Status PENDING  Incomplete  SARS CORONAVIRUS 2 (TAT 6-24 HRS) Nasopharyngeal Nasopharyngeal Swab     Status: None   Collection Time: 10/10/19  9:56 PM   Specimen: Nasopharyngeal Swab  Result Value Ref Range Status   SARS Coronavirus 2 NEGATIVE NEGATIVE Final    Comment: (NOTE) SARS-CoV-2 target nucleic acids are NOT DETECTED. The SARS-CoV-2 RNA is generally detectable in upper and lower respiratory specimens during the acute phase of infection. Negative results do not preclude SARS-CoV-2 infection, do not rule out co-infections with other pathogens, and should not be used as the sole basis for treatment or other patient management decisions. Negative results must be combined with clinical observations, patient history, and epidemiological information. The expected result is Negative. Fact Sheet for Patients: SugarRoll.be Fact Sheet for Healthcare Providers: https://www.woods-mathews.com/ This test is not yet approved or cleared by the [place] FDA and  has been authorized for detection and/or diagnosis of SARS-CoV-2 by FDA under an Emergency Use Authorization (EUA). This EUA will remain  in effect (meaning this test can be used) for the duration of the COVID-19 declaration under Section 56 4(b)(1) of the Act, 21 U.S.C. section 360bbb-3(b)(1), unless the  authorization is terminated or revoked sooner. Performed at [facility], [place] [address] [place], [place] [address]          Radiology Studies: Ct Head Wo Contrast  Result Date: 10/10/2019 CLINICAL DATA:  Fall with facial injury. EXAM: CT HEAD WITHOUT CONTRAST CT MAXILLOFACIAL WITHOUT CONTRAST CT CERVICAL SPINE WITHOUT CONTRAST TECHNIQUE: Multidetector CT imaging of the head, cervical spine, and maxillofacial structures were performed using the standard protocol without intravenous contrast. Multiplanar CT image reconstructions of the cervical spine and maxillofacial structures were also generated. COMPARISON:  None. FINDINGS: CT HEAD FINDINGS Brain: No evidence of hemorrhage or swelling. No acute infarct, mass, or hydrocephalus. CSF density mass in the right middle cranial fossa with local mass effect, up to 4.6 cm in transverse span and 5.5 cm craniocaudal-consistent with arachnoid cyst. Generalized brain atrophy. There is dural-based calcification along the right cerebral convexity, question sequela of remote subdural hemorrhage Vascular: Atherosclerotic calcification. Skull: No calvarial fracture. CT MAXILLOFACIAL FINDINGS Osseous: Bilateral nasal arch fracture with comminution and mild depression on the right. Gas is seen in the anterior nasal septum where there is subtle fracture. No orbital or ethmoid continuation is seen. The mandible is intact and located. Orbits: Bilateral cataract resection. No evidence of postseptal injury. Sinuses: Expansive frontal sinuses. High-density frothy material in the right frontal sinus and right nasal cavity/nasopharynx is presumably epistaxis. In the inferior right frontal sinus is likely epistaxis Soft tissues: Soft tissue swelling about the nose. No opaque foreign body. CT CERVICAL SPINE FINDINGS Alignment: No traumatic malalignment. Skull base and vertebrae: Negative for acute fracture Soft tissues and spinal canal: No prevertebral fluid or swelling.  No visible canal hematoma. Disc levels: Lower cervical degenerative disc narrowing and ridging. Generalized mild cervical facet spurring. Upper chest: Right-sided pleural effusion, reference dedicated chest CT IMPRESSION: 1. Comminuted bilateral nasal arch fracture with mild depression. 2. Anterior and inferior nasal septum fracture. The fracture is nondisplaced but the septum is deviated to the left. 3. No evidence of intracranial or cervical spine injury. 4. Large arachnoid cyst in the right middle cranial fossa. Electronically Signed   By: Monte Fantasia M.D.   On: 10/10/2019 19:06   Ct Angio Chest Pe W And/or Wo Contrast  Result Date: 10/10/2019 CLINICAL DATA:  78 year old male with shortness of breath. EXAM: CT ANGIOGRAPHY CHEST WITH CONTRAST TECHNIQUE: Multidetector CT imaging of the chest was performed using the standard protocol during bolus administration of intravenous contrast. Multiplanar CT image reconstructions and MIPs were obtained to evaluate  the vascular anatomy. CONTRAST:  126mL OMNIPAQUE IOHEXOL 350 MG/ML SOLN COMPARISON:  Chest radiograph dated 10/10/2019. FINDINGS: Cardiovascular: There is no cardiomegaly or pericardial effusion. Mild atherosclerotic calcification of thoracic aorta. No aneurysmal dilatation or dissection. Coronary vascular calcification noted. Evaluation of the pulmonary arteries is limited due to suboptimal opacification and timing of the contrast. No definite large or central pulmonary artery embolus identified. Mediastinum/Nodes: No hilar adenopathy. Evaluation however is limited due to consolidative changes of the right lung. Focal area of soft tissue density to the right of the trachea (series 9, image 182) most consistent with azygos vein. Top-normal subcarinal lymph nodes. There is a small hiatal hernia. The esophagus and the thyroid gland are grossly unremarkable. No mediastinal fluid collection. Lungs/Pleura: There is a large right pleural effusion with  associated mass effect and compressive atelectasis of the majority the right middle lobe and complete consolidation of the right lower lobe. Pneumonia or underlying mass is not excluded. Clinical correlation and follow-up to resolution recommended. Several calcified granuloma noted in the left upper lobe. There is no pneumothorax. The central airways are patent. Mucus secretions noted in the upper trachea. Upper Abdomen: Partially visualized cystic lesion from the interpolar left kidney. Musculoskeletal: Osteopenia. Old lower thoracic compression changes. No acute osseous pathology. Review of the MIP images confirms the above findings. IMPRESSION: 1. No definite CT evidence of central pulmonary artery embolus. 2. Large right pleural effusion with associated mass effect and complete compressive atelectasis of the right lower lobe and atelectasis of the majority of the right middle lobe. Pneumonia or underlying mass is not excluded. Clinical correlation and follow-up to resolution recommended. 3. Aortic Atherosclerosis (ICD10-I70.0). Electronically Signed   By: Anner Crete M.D.   On: 10/10/2019 19:01   Ct Cervical Spine Wo Contrast  Result Date: 10/10/2019 CLINICAL DATA:  Fall with facial injury. EXAM: CT HEAD WITHOUT CONTRAST CT MAXILLOFACIAL WITHOUT CONTRAST CT CERVICAL SPINE WITHOUT CONTRAST TECHNIQUE: Multidetector CT imaging of the head, cervical spine, and maxillofacial structures were performed using the standard protocol without intravenous contrast. Multiplanar CT image reconstructions of the cervical spine and maxillofacial structures were also generated. COMPARISON:  None. FINDINGS: CT HEAD FINDINGS Brain: No evidence of hemorrhage or swelling. No acute infarct, mass, or hydrocephalus. CSF density mass in the right middle cranial fossa with local mass effect, up to 4.6 cm in transverse span and 5.5 cm craniocaudal-consistent with arachnoid cyst. Generalized brain atrophy. There is dural-based  calcification along the right cerebral convexity, question sequela of remote subdural hemorrhage Vascular: Atherosclerotic calcification. Skull: No calvarial fracture. CT MAXILLOFACIAL FINDINGS Osseous: Bilateral nasal arch fracture with comminution and mild depression on the right. Gas is seen in the anterior nasal septum where there is subtle fracture. No orbital or ethmoid continuation is seen. The mandible is intact and located. Orbits: Bilateral cataract resection. No evidence of postseptal injury. Sinuses: Expansive frontal sinuses. High-density frothy material in the right frontal sinus and right nasal cavity/nasopharynx is presumably epistaxis. In the inferior right frontal sinus is likely epistaxis Soft tissues: Soft tissue swelling about the nose. No opaque foreign body. CT CERVICAL SPINE FINDINGS Alignment: No traumatic malalignment. Skull base and vertebrae: Negative for acute fracture Soft tissues and spinal canal: No prevertebral fluid or swelling. No visible canal hematoma. Disc levels: Lower cervical degenerative disc narrowing and ridging. Generalized mild cervical facet spurring. Upper chest: Right-sided pleural effusion, reference dedicated chest CT IMPRESSION: 1. Comminuted bilateral nasal arch fracture with mild depression. 2. Anterior and inferior nasal septum fracture. The  fracture is nondisplaced but the septum is deviated to the left. 3. No evidence of intracranial or cervical spine injury. 4. Large arachnoid cyst in the right middle cranial fossa. Electronically Signed   By: Monte Fantasia M.D.   On: 10/10/2019 19:06   US Abdomen Complete  Result Date: 10/10/2019 CLINICAL DATA:  Abnormal liver function EXAM: ABDOMEN ULTRASOUND COMPLETE COMPARISON:  None. FINDINGS: Gallbladder: No gallstones or wall thickening visualized. No sonographic Murphy sign noted by sonographer. Common bile duct: Diameter: 3 mm Liver: No focal lesion identified. Within normal limits in parenchymal echogenicity.  Portal vein is patent on color Doppler imaging with normal direction of blood flow towards the liver. IVC: No abnormality visualized. Pancreas: Limited visualization due to bowel gas. Spleen: Size and appearance within normal limits. Right Kidney: Length: 10.4 cm. Renal cyst measures 5.0 x 4.1 x 4.7 cm Left Kidney: Length: 10.9 cm. Renal cyst measures 9.0 x 8.1 x 9.2 cm Abdominal aorta: No aneurysm visualized. Other findings: None. IMPRESSION: Bilateral renal cysts.  Otherwise normal abdominal ultrasound. Electronically Signed   By: Ulyses Jarred M.D.   On: 10/10/2019 22:35   Dg Chest Port 1 View  Result Date: 10/10/2019 CLINICAL DATA:  Shortness of breath. EXAM: PORTABLE CHEST 1 VIEW COMPARISON:  None. FINDINGS: There is a large right pleural effusion with underlying opacity. Two calcified granulomata are seen in the left lung. The right hilum and right heart border are not well seen due to the right-sided effusion. Within visualized limits, the heart, hila, and mediastinum are unremarkable. No pneumothorax. No other acute abnormalities. IMPRESSION: There is a large right-sided pleural effusion with underlying opacity which is nonspecific. No other acute abnormalities. Recommend short-term follow-up imaging to ensure resolution. Electronically Signed   By: Dorise Bullion III M.D   On: 10/10/2019 17:39   Ct Maxillofacial Wo Cm  Result Date: 10/10/2019 CLINICAL DATA:  Fall with facial injury. EXAM: CT HEAD WITHOUT CONTRAST CT MAXILLOFACIAL WITHOUT CONTRAST CT CERVICAL SPINE WITHOUT CONTRAST TECHNIQUE: Multidetector CT imaging of the head, cervical spine, and maxillofacial structures were performed using the standard protocol without intravenous contrast. Multiplanar CT image reconstructions of the cervical spine and maxillofacial structures were also generated. COMPARISON:  None. FINDINGS: CT HEAD FINDINGS Brain: No evidence of hemorrhage or swelling. No acute infarct, mass, or hydrocephalus. CSF density  mass in the right middle cranial fossa with local mass effect, up to 4.6 cm in transverse span and 5.5 cm craniocaudal-consistent with arachnoid cyst. Generalized brain atrophy. There is dural-based calcification along the right cerebral convexity, question sequela of remote subdural hemorrhage Vascular: Atherosclerotic calcification. Skull: No calvarial fracture. CT MAXILLOFACIAL FINDINGS Osseous: Bilateral nasal arch fracture with comminution and mild depression on the right. Gas is seen in the anterior nasal septum where there is subtle fracture. No orbital or ethmoid continuation is seen. The mandible is intact and located. Orbits: Bilateral cataract resection. No evidence of postseptal injury. Sinuses: Expansive frontal sinuses. High-density frothy material in the right frontal sinus and right nasal cavity/nasopharynx is presumably epistaxis. In the inferior right frontal sinus is likely epistaxis Soft tissues: Soft tissue swelling about the nose. No opaque foreign body. CT CERVICAL SPINE FINDINGS Alignment: No traumatic malalignment. Skull base and vertebrae: Negative for acute fracture Soft tissues and spinal canal: No prevertebral fluid or swelling. No visible canal hematoma. Disc levels: Lower cervical degenerative disc narrowing and ridging. Generalized mild cervical facet spurring. Upper chest: Right-sided pleural effusion, reference dedicated chest CT IMPRESSION: 1. Comminuted bilateral nasal arch fracture  with mild depression. 2. Anterior and inferior nasal septum fracture. The fracture is nondisplaced but the septum is deviated to the left. 3. No evidence of intracranial or cervical spine injury. 4. Large arachnoid cyst in the right middle cranial fossa. Electronically Signed   By: Monte Fantasia M.D.   On: 10/10/2019 19:06        Scheduled Meds: . Chlorhexidine Gluconate Cloth  6 each Topical Daily  . enoxaparin (LOVENOX) injection  40 mg Subcutaneous Q24H  . feeding supplement (PRO-STAT  SUGAR FREE 64)  30 mL Oral BID  . insulin aspart  0-9 Units Subcutaneous Q4H  . lidocaine (PF)       Continuous Infusions: . azithromycin Stopped (10/11/19 0013)  . ceFEPime (MAXIPIME) IV Stopped (10/11/19 0412)  . vancomycin       LOS: 1 day    Time spent: 76min    Domenic Polite, MD [facility] 10/11/2019, 11:08 AM

## 2019-10-11 NOTE — ED Notes (Signed)
Breakfast ordered 

## 2019-10-11 NOTE — Progress Notes (Signed)
Patient refusing all medications at this time including azithromycin, novolog, lovenox, and prostat. Patient educated on potential lethal complications of refusing pertinent medications. Despite education patient continued to refuse medications at this time. Asked patient for confirmation of refusal of medications 3 times. Charge nurse and provider on call notified. Will attempt to administer medications at a later time.

## 2019-10-12 ENCOUNTER — Inpatient Hospital Stay (HOSPITAL_COMMUNITY): Payer: Medicare HMO

## 2019-10-12 DIAGNOSIS — J189 Pneumonia, unspecified organism: Secondary | ICD-10-CM | POA: Diagnosis not present

## 2019-10-12 DIAGNOSIS — J90 Pleural effusion, not elsewhere classified: Secondary | ICD-10-CM | POA: Diagnosis not present

## 2019-10-12 LAB — TRIGLYCERIDES, BODY FLUIDS: Triglycerides, Fluid: 33 mg/dL

## 2019-10-12 LAB — BASIC METABOLIC PANEL
Anion gap: 8 (ref 5–15)
BUN: 15 mg/dL (ref 8–23)
CO2: 24 mmol/L (ref 22–32)
Calcium: 8.3 mg/dL — ABNORMAL LOW (ref 8.9–10.3)
Chloride: 107 mmol/L (ref 98–111)
Creatinine, Ser: 1.01 mg/dL (ref 0.61–1.24)
GFR calc Af Amer: >60 mL/min (ref >60)
GFR calc non Af Amer: >60 mL/min (ref >60)
Glucose, Bld: 206 mg/dL — ABNORMAL HIGH (ref 70–99)
Potassium: 4 mmol/L (ref 3.5–5.1)
Sodium: 139 mmol/L (ref 135–145)

## 2019-10-12 LAB — CYTOLOGY - NON PAP

## 2019-10-12 LAB — GLUCOSE, CAPILLARY
Glucose-Capillary: 203 mg/dL — ABNORMAL HIGH (ref 70–99)
Glucose-Capillary: 182 mg/dL — ABNORMAL HIGH (ref 70–99)
Glucose-Capillary: 195 mg/dL — ABNORMAL HIGH (ref 70–99)
Glucose-Capillary: 158 mg/dL — ABNORMAL HIGH (ref 70–99)
Glucose-Capillary: 200 mg/dL — ABNORMAL HIGH (ref 70–99)

## 2019-10-12 LAB — FOLATE RBC
Folate, Hemolysate: 339 ng/mL
Folate, RBC: 1076 ng/mL (ref >498)
Hematocrit: 31.5 % — ABNORMAL LOW (ref 37.5–51.0)

## 2019-10-12 LAB — HEMOGLOBIN AND HEMATOCRIT, BLOOD
HCT: 34 % — ABNORMAL LOW (ref 39.0–52.0)
Hemoglobin: 11.4 g/dL — ABNORMAL LOW (ref 13.0–17.0)

## 2019-10-12 LAB — LEGIONELLA PNEUMOPHILA SEROGP 1 UR AG: L. pneumophila Serogp 1 Ur Ag: NEGATIVE

## 2019-10-12 MED ORDER — SODIUM CHLORIDE (PF) 0.9 % IJ SOLN
10.0000 mg | Freq: Once | INTRAMUSCULAR | Status: AC
  Administered 2019-10-12: 10 mg via INTRAPLEURAL
  Filled 2019-10-12: qty 10

## 2019-10-12 MED ORDER — STERILE WATER FOR INJECTION IJ SOLN
5.0000 mg | Freq: Once | RESPIRATORY_TRACT | Status: AC
  Administered 2019-10-12: 13:00:00 5 mg via INTRAPLEURAL
  Filled 2019-10-12: qty 5

## 2019-10-12 NOTE — Progress Notes (Signed)
Patient refused respiratory panel at this time. Patient is currently on droplet precautions. Will pass on to day shift.

## 2019-10-12 NOTE — Progress Notes (Signed)
Patient sitting up eating lunch with wife at bedside. Cooperative at this moment but in and out of confusion.

## 2019-10-12 NOTE — Progress Notes (Signed)
Pt refusing vitals to be taken

## 2019-10-12 NOTE — Progress Notes (Signed)
Patient continues to refuse medication at this time despite education. Patient stated that he believes that we are trying to keep him here against his will. Patient has stated that he will be leaving tomorrow, he does not trust the hospital staff, and he will be callings news stations about his experience at the hospital. Patient stated that he will not be taking any treatment unless his PCP Dr.Letvik advises him to.

## 2019-10-12 NOTE — Progress Notes (Signed)
Patient was not responding to voice, or opening eyes. Rapid response nurse notified, provider on call notified. Vital signs stable.  Rapid response nurse performed an assessment and determined that there was no concern. Shortly after leaving the room. Patient opened his eyes and began using the urinal. Will continue to monitor.

## 2019-10-12 NOTE — Progress Notes (Signed)
Patient had chest tube placed at approximately 1400 on 10/11/19. Since chest tube was placed a total of around 2541ml output was noted and output went from serous sanguineous to sanguineous. Critical care physician Oletta Darter and charge nurse notified. H&H ordered, results reviewed. Patient stable at this time. Will continue to monitor.

## 2019-10-12 NOTE — Consult Note (Signed)
NAME:  Frank Carlson, MRN:  [id number], DOB:  October 19, 1941, LOS: 2 ADMISSION DATE:  10/10/2019, CONSULTATION DATE:  10/12/2019  REFERRING MD:  Domenic Polite, MD, CHIEF COMPLAINT:  Loculated pleural effusion  History of present illness   The patient is an 78 year old gentleman with past medical history of non-insulin-dependent diabetes and hyperlipidemia who presents after having a fall while walking in his neighborhood.  He was feeling short of breath while walking, and then fell forward.  He did sustain some abrasions to his nose and forehead with a small amount of bleeding.  He has a remote smoking history, quit over 60 years ago.  He denies having any pneumonia recently, but was having some nasal congestion and cough that was treated with doxycycline a few weeks ago.  He additionally reports having symptoms of cough earlier in the year around May or June which was attributed to mold.  He has not had any sick contacts, he has not had any Covid exposure.  He denies any fevers chills night sweats or weight loss.  His main issue is a dry cough and worsening dyspnea on exertion.  A chest x-ray demonstrated large right-sided loculated pleural effusion which was confirmed on CT chest.  He underwent ultrasound-guided thoracentesis in the IR with about 500 cc of serous fluid drained.  Pulmonary is being consulted to assist with management of the persistent loculated pleural effusion.  Subjective: Issues overnight. Delirium. Pulling lines.   Objective   Blood pressure 133/70, pulse 88, temperature 98 F (36.7 C), temperature source Axillary, resp. rate 16, height 6' 0.5" (1.842 m), weight 88 kg, SpO2 95 %.        Intake/Output Summary (Last 24 hours) at 10/12/2019 1218 Last data filed at 10/12/2019 0335 Gross per 24 hour  Intake 580 ml  Output 3380 ml  Net -2800 ml   Filed Weights   10/11/19 0421 10/11/19 0600  Weight: 81.6 kg 88 kg    Examination: General: alert, resting in bed, oriented   HENT: bleeding nose  Lungs: diminished tight base, ct in place  Cardiovascular: rrr, no mrg  Abdomen: soft, nt nd  Extremities: no edema  Neuro: AAOX2   Assessment & Plan:   Complex pleural effusion  Loculated pleural effusion, likely empyema - good response to first dose tpa/dnase - >3L of fluid out of chest since yesterday  - repeat dosing today  - repeat CXR in the AM  Rest per primary  Pulmonary service will follow   Labs   CBC: Recent Labs  Lab 10/10/19 1722 10/11/19 0235 10/12/19 0017  WBC 17.5* 14.8*  --   NEUTROABS 13.8*  --   --   HGB 10.9* 10.5* 11.4*  HCT 33.8* 32.3* 34.0*  MCV 84.3 85.0  --   PLT 305 317  --     Basic Metabolic Panel: Recent Labs  Lab 10/10/19 1722 10/11/19 0235 10/12/19 0256  NA 137 140 139  K 3.9 3.7 4.0  CL 100 103 107  CO2 24 25 24   GLUCOSE 160* 107* 206*  BUN 13 12 15   CREATININE 0.96 0.81 1.01  CALCIUM 8.5* 8.6* 8.3*   GFR: Estimated Creatinine Clearance: 65 mL/min (by C-G formula based on SCr of 1.01 mg/dL). Recent Labs  Lab 10/10/19 1722 10/10/19 2116 10/11/19 0235  PROCALCITON 0.27  --   --   WBC 17.5*  --  14.8*  LATICACIDVEN 1.9 1.0  --     Liver Function Tests: Recent Labs  Lab 10/10/19 1722  10/11/19 0235  AST 120* 87*  ALT 145* 123*  ALKPHOS 119 114  BILITOT 1.6* 1.0  PROT 7.0 6.3*  6.5  ALBUMIN 2.2* 2.0*   No results for input(s): LIPASE, AMYLASE in the last 168 hours. No results for input(s): AMMONIA in the last 168 hours.  ABG    Component Value Date/Time   TCO2 28 06/15/2015 1520     Coagulation Profile: Recent Labs  Lab 10/10/19 1722  INR 1.1    Cardiac Enzymes: Recent Labs  Lab 10/11/19 0235  CKTOTAL 71  CKMB 3.2    HbA1C: Hgb A1c MFr Bld  Date/Time Value Ref Range Status  10/11/2019 02:35 AM 7.1 (H) 4.8 - 5.6 % Final    Comment:    (NOTE) Pre diabetes:          5.7%-6.4% Diabetes:              >6.4% Glycemic control for   <7.0% adults with diabetes    04/28/2019 09:12 AM 6.5 4.6 - 6.5 % Final    Comment:    Glycemic Control Guidelines for People with Diabetes:Non Diabetic:  <6%Goal of Therapy: <7%Additional Action Suggested:  >8%     CBG: Recent Labs  Lab 10/11/19 2127 10/11/19 2353 10/12/19 0328 10/12/19 0830 10/12/19 1206  GLUCAP 215* 237* 203* 182* [place], DO [facility] 10/12/2019 12:18 PM

## 2019-10-12 NOTE — Progress Notes (Signed)
Patient became agitated and began to disconnect leads from chest and then became combative and disconnected chest tube from port. Chest tube still connected to patient but may have become dislodged. Patient began attempting to punch primary nurse. Staff members called to bedside to attempt to restrain patient. Patient became verbally abusive and began punching and kicking at staff members. Four point restraints applied. Dr.Sommer notified. Chest x-ray and wrist restraints ordered. Four point restraints removed and wrist restraints applied.Will continue to monitor

## 2019-10-12 NOTE — Progress Notes (Signed)
Called to room by pt's primary nurse pt chest placed at 14hours had drained approximately 2500 cc's of fluid, 500 hundred in under 2 hour fluid wittnessed by this RN  Had turned from serosangious to sainginous since sahara change at 2230 Dr, Oletta Darter notified  Ordered stat CBC pt first refused, I explained to patient the reason for the blood draw he eventually let lab draw the blood. Pt knows where he is but thinks he has been here for 3 days and that we are trying to kill him. I reinforced in laymen terms his medical problems. Pt however continues to not believe me and has refused med's and treatments for the primary nurse

## 2019-10-12 NOTE — Progress Notes (Signed)
Upon entering pt's room at the start of shift, pt behaving violently. Pt with soft wrist restraints, but managed to chew through telemetry monitor wire. CCMD notified to put pt on standby. Pt kicked multiple times at staff, therefore leg restraints had to be placed for the safety of staff. Pt pulled at chest tube multiple times, while laughing and stated that he was "going to pull it out". MD notified. Waiting for response.

## 2019-10-12 NOTE — Progress Notes (Signed)
Bedside Rn called stating that patient pulled out chest tube and is combative Asked RN to get occlusive dressing over site and escalate call to EMD

## 2019-10-12 NOTE — Progress Notes (Signed)
[facility] Progress Note Patient Name: Frank Carlson DOB: 21-Mar-1941 MRN: [id number]   Date of Service  10/12/2019  HPI/Events of Note  Patient pulled chest tube out.  eICU Interventions  Will order: 1. Bilateral soft wrist restraints X 2 hours.  2. Portable CXR STAT.  3. Ground team notified to evaluate the patent at bedside.      Intervention Category Major Interventions: Delirium, psychosis, severe agitation - evaluation and management  Shardee Dieu [place] 10/12/2019, 6:47 AM

## 2019-10-12 NOTE — Progress Notes (Addendum)
PROGRESS NOTE    Frank Carlson  [id number] DOB: 06-Feb-1941 DOA: 10/10/2019 PCP: Venia Carbon, MD  Brief Narrative: Frank Carlson is an 78 year old male with history of type 2 diabetes mellitus, BPH, dyslipidemia presented to the ED following a near syncopal episode. -He reports dyspnea on exertion for the last 1 month, associated with nonproductive cough, he was treated with doxycycline about 3 weeks ago with some temporary improvement in symptoms which subsequently worsened. Yesterday 11/28 he was walking uphill when he became acutely dyspneic and fell forward, denies any loss of consciousness. -In the emergency room he was febrile to 102, white count of 17,000, noted to have nasal arch fracture, CT chest noted large R effusion -Underwent ultrasound-guided thoracentesis in radiology, 500 cc of clear fluid drained, IR PA in the note documented concern for loculations -PCCM consulted 11/29, underwent chest tube placement and TPA/DNase   Assessment & Plan:   Loculated right pleural effusion -Empyema versus complex parapneumonic effusion, patient was febrile to 102 on admission with leukocytosis -Greatly appreciate PCCM assistance -Continue IV cefepime and azithromycin -Underwent chest tube placement yesterday, treated with DNase/TPA and approximately 3248ml of fluid drained, fluid exudative -Pleural fluid Gram stain negative so far, follow-up cultures, blood cultures also negative  Nasal bridge fracture -ENT consult appreciated -Recommended to follow-up with Dr.Teoh will likely not need any surgical intervention  Delirium/agitation 11/30 am -Patient was agitated and attempting to leave last night -He is more calm and oriented today, although has some paranoid thoughts, denies any alcohol use, do not suspect withdrawal -Requested patient's wife to visit and stay with him during daytime hours if possible -DC restraints if able to after family comes by  Mildly elevated  LFTs -Hold statin, bili is normal -Trend and monitor  Iron deficiency anemia -Given IV iron x1 -Outpatient gastroenterology follow-up for colonoscopy  Type 2 diabetes mellitus -Metformin on hold, CBGs are stable, hemoglobin A1c 7.1  BPH  Renal cysts noted on imaging  DVT prophylaxis: Lovenox Code Status: Full code Family Communication: No family at bedside, called and discussed with patient's wife Disposition Plan: Home pending resolution of empyema  Consultants:   PCCM   Procedures: Ultrasound thoracentesis 11/29 500 mL drained  Chest tube placement Dr. Shearon Stalls 11/29  Antimicrobials:    Subjective: -Extremely agitated overnight, attempted to pull out his chest tube, was kicking and screaming at the nurses Objective: Vitals:   10/11/19 2009 10/11/19 2352 10/12/19 0344 10/12/19 1220  BP: (!) 145/72 130/71 133/70 120/61  Pulse: (!) 103 97 88 87  Resp: 19 (!) 21 16 (!) 21  Temp: 98.1 F (36.7 C) 98 F (36.7 C) 98 F (36.7 C)   TempSrc: Oral Oral Axillary   SpO2: 99% 95% 95% 95%  Weight:      Height:        Intake/Output Summary (Last 24 hours) at 10/12/2019 1336 Last data filed at 10/12/2019 1137 Gross per 24 hour  Intake 820 ml  Output 3380 ml  Net -2560 ml   Filed Weights   10/11/19 0421 10/11/19 0600  Weight: 81.6 kg 88 kg    Examination:  Gen: Angry elderly male laying in bed with restraints, he is awake alert oriented to self to place and to time HEENT: Ecchymosis, bruising and swelling of his nasal bridge  lungs: Diminished breath sounds on the right, otherwise clear  CVS: RRR,No Gallops,Rubs or new Murmurs Abd: soft, Non tender, non distended, BS present Extremities: No edema  skin: no new rashes Psychiatry: Angry,  upset, paranoid about night staff    Data Reviewed:   CBC: Recent Labs  Lab 10/10/19 1722 10/11/19 0235 10/12/19 0017  WBC 17.5* 14.8*  --   NEUTROABS 13.8*  --   --   HGB 10.9* 10.5* 11.4*  HCT 33.8* 32.3* 34.0*  MCV  84.3 85.0  --   PLT 305 317  --    Basic Metabolic Panel: Recent Labs  Lab 10/10/19 1722 10/11/19 0235 10/12/19 0256  NA 137 140 139  K 3.9 3.7 4.0  CL 100 103 107  CO2 24 25 24   GLUCOSE 160* 107* 206*  BUN 13 12 15   CREATININE 0.96 0.81 1.01  CALCIUM 8.5* 8.6* 8.3*   GFR: Estimated Creatinine Clearance: 65 mL/min (by C-G formula based on SCr of 1.01 mg/dL). Liver Function Tests: Recent Labs  Lab 10/10/19 1722 10/11/19 0235  AST 120* 87*  ALT 145* 123*  ALKPHOS 119 114  BILITOT 1.6* 1.0  PROT 7.0 6.3*  6.5  ALBUMIN 2.2* 2.0*   No results for input(s): LIPASE, AMYLASE in the last 168 hours. No results for input(s): AMMONIA in the last 168 hours. Coagulation Profile: Recent Labs  Lab 10/10/19 1722  INR 1.1   Cardiac Enzymes: Recent Labs  Lab 10/11/19 0235  CKTOTAL 71  CKMB 3.2   BNP (last 3 results) No results for input(s): PROBNP in the last 8760 hours. HbA1C: Recent Labs    10/11/19 0235  HGBA1C 7.1*   CBG: Recent Labs  Lab 10/11/19 2127 10/11/19 2353 10/12/19 0328 10/12/19 0830 10/12/19 1206  GLUCAP 215* 237* 203* 182* 195*   Lipid Profile: Recent Labs    10/10/19 1722  TRIG 69   Thyroid Function Tests: No results for input(s): TSH, T4TOTAL, FREET4, T3FREE, THYROIDAB in the last 72 hours. Anemia Panel: Recent Labs    10/10/19 1722 10/11/19 0235  VITAMINB12  --  1,231*  FERRITIN 557*  --   TIBC  --  168*  IRON  --  11*   Urine analysis:    Component Value Date/Time   COLORURINE AMBER (A) 10/10/2019 1808   APPEARANCEUR HAZY (A) 10/10/2019 1808   LABSPEC 1.023 10/10/2019 1808   PHURINE 5.0 10/10/2019 1808   GLUCOSEU NEGATIVE 10/10/2019 1808   HGBUR NEGATIVE 10/10/2019 1808   HGBUR moderate 11/03/2009 0804   BILIRUBINUR NEGATIVE 10/10/2019 1808   BILIRUBINUR 2+ 11/16/2015 1227   KETONESUR NEGATIVE 10/10/2019 1808   PROTEINUR 30 (A) 10/10/2019 1808   UROBILINOGEN >=8.0 11/16/2015 1227   UROBILINOGEN 0.2 11/03/2009 0804    NITRITE NEGATIVE 10/10/2019 1808   LEUKOCYTESUR NEGATIVE 10/10/2019 1808   Sepsis Labs: @LABRCNTIP (procalcitonin:4,lacticidven:4)  ) Recent Results (from the past 240 hour(s))  Culture, blood (routine x 2)     Status: None (Preliminary result)   Collection Time: 10/10/19  5:15 PM   Specimen: BLOOD RIGHT ARM  Result Value Ref Range Status   Specimen Description BLOOD RIGHT ARM  Final   Special Requests   Final    BOTTLES DRAWN AEROBIC AND ANAEROBIC Blood Culture adequate volume   Culture   Final    NO GROWTH 2 DAYS Performed at [facility], [place] [address] [place], [place] [address]    Report Status PENDING  Incomplete  Culture, blood (routine x 2)     Status: None (Preliminary result)   Collection Time: 10/10/19  5:22 PM   Specimen: BLOOD  Result Value Ref Range Status   Specimen Description BLOOD RIGHT ANTECUBITAL  Final   Special  Requests   Final    BOTTLES DRAWN AEROBIC AND ANAEROBIC Blood Culture adequate volume   Culture   Final    NO GROWTH 2 DAYS Performed at [facility], [place] [address] [place], [place] [address]    Report Status PENDING  Incomplete  Urine culture     Status: None   Collection Time: 10/10/19  6:08 PM   Specimen: Urine, Clean Catch  Result Value Ref Range Status   Specimen Description URINE, CLEAN CATCH  Final   Special Requests NONE  Final   Culture   Final    NO GROWTH Performed at [facility], [place] [address] [place], [place] [address]    Report Status 10/11/2019 FINAL  Final  SARS CORONAVIRUS 2 (TAT 6-24 HRS) Nasopharyngeal Nasopharyngeal Swab     Status: None   Collection Time: 10/10/19  9:56 PM   Specimen: Nasopharyngeal Swab  Result Value Ref Range Status   SARS Coronavirus 2 NEGATIVE NEGATIVE Final    Comment: (NOTE) SARS-CoV-2 target nucleic acids are NOT DETECTED. The SARS-CoV-2 RNA is generally detectable in upper and lower respiratory specimens during the acute phase of infection. Negative results do not  preclude SARS-CoV-2 infection, do not rule out co-infections with other pathogens, and should not be used as the sole basis for treatment or other patient management decisions. Negative results must be combined with clinical observations, patient history, and epidemiological information. The expected result is Negative. Fact Sheet for Patients: SugarRoll.be Fact Sheet for Healthcare Providers: https://www.woods-mathews.com/ This test is not yet approved or cleared by the [place] FDA and  has been authorized for detection and/or diagnosis of SARS-CoV-2 by FDA under an Emergency Use Authorization (EUA). This EUA will remain  in effect (meaning this test can be used) for the duration of the COVID-19 declaration under Section 56 4(b)(1) of the Act, 21 U.S.C. section 360bbb-3(b)(1), unless the authorization is terminated or revoked sooner. Performed at [facility], [place] [address] [place], [place] [address]   Gram stain     Status: None   Collection Time: 10/11/19 11:03 AM   Specimen: PATH Cytology Pleural fluid  Result Value Ref Range Status   Specimen Description PLEURAL RIGHT  Final   Special Requests NONE  Final   Gram Stain   Final    FEW WBC PRESENT, PREDOMINANTLY PMN NO ORGANISMS SEEN Performed at [facility], [place] [address], [place], [place] [address]    Report Status 10/11/2019 FINAL  Final  Culture, body fluid-bottle     Status: None (Preliminary result)   Collection Time: 10/11/19 11:03 AM   Specimen: Pleura  Result Value Ref Range Status   Specimen Description PLEURAL RIGHT  Final   Special Requests NONE  Final   Culture   Final    NO GROWTH < 24 HOURS Performed at [facility], [place] [address] [place], [place] [address]    Report Status PENDING  Incomplete  Culture, body fluid-bottle     Status: None (Preliminary result)   Collection Time: 10/11/19  3:27 PM   Specimen: Pleura  Result Value Ref  Range Status   Specimen Description PLEURAL  Final   Special Requests NONE  Final   Culture   Final    NO GROWTH < 24 HOURS Performed at [facility], [place] [address] [place], [place] [address]    Report Status PENDING  Incomplete  Gram stain     Status: None  Collection Time: 10/11/19  3:27 PM   Specimen: Pleura  Result Value Ref Range Status   Specimen Description PLEURAL  Final   Special Requests NONE  Final   Gram Stain   Final    RARE WBC PRESENT, PREDOMINANTLY PMN NO ORGANISMS SEEN Performed at [facility], [place] [address], [place], [place] [address]    Report Status 10/11/2019 FINAL  Final         Radiology Studies: Dg Chest 1 View  Result Date: 10/11/2019 CLINICAL DATA:  RIGHT-sided pleural effusion status post thoracentesis. EXAM: CHEST  1 VIEW COMPARISON:  Chest x-ray dated 10/10/2019. RIGHT chest ultrasound from earlier today. FINDINGS: The large opacity within the RIGHT hemithorax is not appreciably changed in size compared to the earlier chest x-ray. LEFT lung remains clear. No pneumothorax seen. Heart size and mediastinal contours are stable. IMPRESSION: No significant change of the RIGHT lung opacity despite interval thoracentesis. No pneumothorax seen status post thoracentesis. Electronically Signed   By: Franki Cabot M.D.   On: 10/11/2019 11:15   Ct Head Wo Contrast  Result Date: 10/10/2019 CLINICAL DATA:  Fall with facial injury. EXAM: CT HEAD WITHOUT CONTRAST CT MAXILLOFACIAL WITHOUT CONTRAST CT CERVICAL SPINE WITHOUT CONTRAST TECHNIQUE: Multidetector CT imaging of the head, cervical spine, and maxillofacial structures were performed using the standard protocol without intravenous contrast. Multiplanar CT image reconstructions of the cervical spine and maxillofacial structures were also generated. COMPARISON:  None. FINDINGS: CT HEAD FINDINGS Brain: No evidence of hemorrhage or swelling. No acute infarct, mass, or hydrocephalus. CSF density mass in  the right middle cranial fossa with local mass effect, up to 4.6 cm in transverse span and 5.5 cm craniocaudal-consistent with arachnoid cyst. Generalized brain atrophy. There is dural-based calcification along the right cerebral convexity, question sequela of remote subdural hemorrhage Vascular: Atherosclerotic calcification. Skull: No calvarial fracture. CT MAXILLOFACIAL FINDINGS Osseous: Bilateral nasal arch fracture with comminution and mild depression on the right. Gas is seen in the anterior nasal septum where there is subtle fracture. No orbital or ethmoid continuation is seen. The mandible is intact and located. Orbits: Bilateral cataract resection. No evidence of postseptal injury. Sinuses: Expansive frontal sinuses. High-density frothy material in the right frontal sinus and right nasal cavity/nasopharynx is presumably epistaxis. In the inferior right frontal sinus is likely epistaxis Soft tissues: Soft tissue swelling about the nose. No opaque foreign body. CT CERVICAL SPINE FINDINGS Alignment: No traumatic malalignment. Skull base and vertebrae: Negative for acute fracture Soft tissues and spinal canal: No prevertebral fluid or swelling. No visible canal hematoma. Disc levels: Lower cervical degenerative disc narrowing and ridging. Generalized mild cervical facet spurring. Upper chest: Right-sided pleural effusion, reference dedicated chest CT IMPRESSION: 1. Comminuted bilateral nasal arch fracture with mild depression. 2. Anterior and inferior nasal septum fracture. The fracture is nondisplaced but the septum is deviated to the left. 3. No evidence of intracranial or cervical spine injury. 4. Large arachnoid cyst in the right middle cranial fossa. Electronically Signed   By: Monte Fantasia M.D.   On: 10/10/2019 19:06   Ct Angio Chest Pe W And/or Wo Contrast  Result Date: 10/10/2019 CLINICAL DATA:  78 year old male with shortness of breath. EXAM: CT ANGIOGRAPHY CHEST WITH CONTRAST TECHNIQUE:  Multidetector CT imaging of the chest was performed using the standard protocol during bolus administration of intravenous contrast. Multiplanar CT image reconstructions and MIPs were obtained to evaluate the vascular anatomy. CONTRAST:  135mL OMNIPAQUE IOHEXOL 350 MG/ML SOLN COMPARISON:  Chest radiograph dated  10/10/2019. FINDINGS: Cardiovascular: There is no cardiomegaly or pericardial effusion. Mild atherosclerotic calcification of thoracic aorta. No aneurysmal dilatation or dissection. Coronary vascular calcification noted. Evaluation of the pulmonary arteries is limited due to suboptimal opacification and timing of the contrast. No definite large or central pulmonary artery embolus identified. Mediastinum/Nodes: No hilar adenopathy. Evaluation however is limited due to consolidative changes of the right lung. Focal area of soft tissue density to the right of the trachea (series 9, image 182) most consistent with azygos vein. Top-normal subcarinal lymph nodes. There is a small hiatal hernia. The esophagus and the thyroid gland are grossly unremarkable. No mediastinal fluid collection. Lungs/Pleura: There is a large right pleural effusion with associated mass effect and compressive atelectasis of the majority the right middle lobe and complete consolidation of the right lower lobe. Pneumonia or underlying mass is not excluded. Clinical correlation and follow-up to resolution recommended. Several calcified granuloma noted in the left upper lobe. There is no pneumothorax. The central airways are patent. Mucus secretions noted in the upper trachea. Upper Abdomen: Partially visualized cystic lesion from the interpolar left kidney. Musculoskeletal: Osteopenia. Old lower thoracic compression changes. No acute osseous pathology. Review of the MIP images confirms the above findings. IMPRESSION: 1. No definite CT evidence of central pulmonary artery embolus. 2. Large right pleural effusion with associated mass effect and  complete compressive atelectasis of the right lower lobe and atelectasis of the majority of the right middle lobe. Pneumonia or underlying mass is not excluded. Clinical correlation and follow-up to resolution recommended. 3. Aortic Atherosclerosis (ICD10-I70.0). Electronically Signed   By: Anner Crete M.D.   On: 10/10/2019 19:01   Ct Cervical Spine Wo Contrast  Result Date: 10/10/2019 CLINICAL DATA:  Fall with facial injury. EXAM: CT HEAD WITHOUT CONTRAST CT MAXILLOFACIAL WITHOUT CONTRAST CT CERVICAL SPINE WITHOUT CONTRAST TECHNIQUE: Multidetector CT imaging of the head, cervical spine, and maxillofacial structures were performed using the standard protocol without intravenous contrast. Multiplanar CT image reconstructions of the cervical spine and maxillofacial structures were also generated. COMPARISON:  None. FINDINGS: CT HEAD FINDINGS Brain: No evidence of hemorrhage or swelling. No acute infarct, mass, or hydrocephalus. CSF density mass in the right middle cranial fossa with local mass effect, up to 4.6 cm in transverse span and 5.5 cm craniocaudal-consistent with arachnoid cyst. Generalized brain atrophy. There is dural-based calcification along the right cerebral convexity, question sequela of remote subdural hemorrhage Vascular: Atherosclerotic calcification. Skull: No calvarial fracture. CT MAXILLOFACIAL FINDINGS Osseous: Bilateral nasal arch fracture with comminution and mild depression on the right. Gas is seen in the anterior nasal septum where there is subtle fracture. No orbital or ethmoid continuation is seen. The mandible is intact and located. Orbits: Bilateral cataract resection. No evidence of postseptal injury. Sinuses: Expansive frontal sinuses. High-density frothy material in the right frontal sinus and right nasal cavity/nasopharynx is presumably epistaxis. In the inferior right frontal sinus is likely epistaxis Soft tissues: Soft tissue swelling about the nose. No opaque foreign  body. CT CERVICAL SPINE FINDINGS Alignment: No traumatic malalignment. Skull base and vertebrae: Negative for acute fracture Soft tissues and spinal canal: No prevertebral fluid or swelling. No visible canal hematoma. Disc levels: Lower cervical degenerative disc narrowing and ridging. Generalized mild cervical facet spurring. Upper chest: Right-sided pleural effusion, reference dedicated chest CT IMPRESSION: 1. Comminuted bilateral nasal arch fracture with mild depression. 2. Anterior and inferior nasal septum fracture. The fracture is nondisplaced but the septum is deviated to the left. 3. No evidence of intracranial  or cervical spine injury. 4. Large arachnoid cyst in the right middle cranial fossa. Electronically Signed   By: Monte Fantasia M.D.   On: 10/10/2019 19:06   US Abdomen Complete  Result Date: 10/10/2019 CLINICAL DATA:  Abnormal liver function EXAM: ABDOMEN ULTRASOUND COMPLETE COMPARISON:  None. FINDINGS: Gallbladder: No gallstones or wall thickening visualized. No sonographic Murphy sign noted by sonographer. Common bile duct: Diameter: 3 mm Liver: No focal lesion identified. Within normal limits in parenchymal echogenicity. Portal vein is patent on color Doppler imaging with normal direction of blood flow towards the liver. IVC: No abnormality visualized. Pancreas: Limited visualization due to bowel gas. Spleen: Size and appearance within normal limits. Right Kidney: Length: 10.4 cm. Renal cyst measures 5.0 x 4.1 x 4.7 cm Left Kidney: Length: 10.9 cm. Renal cyst measures 9.0 x 8.1 x 9.2 cm Abdominal aorta: No aneurysm visualized. Other findings: None. IMPRESSION: Bilateral renal cysts.  Otherwise normal abdominal ultrasound. Electronically Signed   By: Ulyses Jarred M.D.   On: 10/10/2019 22:35   Dg Chest Port 1 View  Result Date: 10/12/2019 CLINICAL DATA:  Chest tube. EXAM: PORTABLE CHEST 1 VIEW COMPARISON:  10/11/2019. FINDINGS: Right chest tube noted with tip over the lower right chest.  Interval improvement of right pleural effusion. No pneumothorax noted. Right base atelectatic changes and/or infiltrate noted. Calcified pulmonary nodules again noted consistent granulomas. Heart size stable. IMPRESSION: 1. Right chest tube noted with tip over the lower right chest. Interval improvement of right pleural effusion. No pneumothorax noted. 2. Right base atelectatic changes and/or infiltrate noted. Electronically Signed   By: Marcello Moores  Register   On: 10/12/2019 07:10   Portable Chest  Result Date: 10/11/2019 CLINICAL DATA:  Chest tube placement EXAM: PORTABLE CHEST 1 VIEW COMPARISON:  October 11, 2019 FINDINGS: A right chest tube is been placed in the interval. A moderate right pleural effusion with underlying opacity remains. No pneumothorax. Granuloma are seen in the left lung. No other changes. IMPRESSION: A right chest tube has been placed in the interval, in good position. A moderate right effusion with underlying opacity remains. No pneumothorax. Electronically Signed   By: Dorise Bullion III M.D   On: 10/11/2019 15:17   Dg Chest Port 1 View  Result Date: 10/10/2019 CLINICAL DATA:  Shortness of breath. EXAM: PORTABLE CHEST 1 VIEW COMPARISON:  None. FINDINGS: There is a large right pleural effusion with underlying opacity. Two calcified granulomata are seen in the left lung. The right hilum and right heart border are not well seen due to the right-sided effusion. Within visualized limits, the heart, hila, and mediastinum are unremarkable. No pneumothorax. No other acute abnormalities. IMPRESSION: There is a large right-sided pleural effusion with underlying opacity which is nonspecific. No other acute abnormalities. Recommend short-term follow-up imaging to ensure resolution. Electronically Signed   By: Dorise Bullion III M.D   On: 10/10/2019 17:39   Ct Maxillofacial Wo Cm  Result Date: 10/10/2019 CLINICAL DATA:  Fall with facial injury. EXAM: CT HEAD WITHOUT CONTRAST CT  MAXILLOFACIAL WITHOUT CONTRAST CT CERVICAL SPINE WITHOUT CONTRAST TECHNIQUE: Multidetector CT imaging of the head, cervical spine, and maxillofacial structures were performed using the standard protocol without intravenous contrast. Multiplanar CT image reconstructions of the cervical spine and maxillofacial structures were also generated. COMPARISON:  None. FINDINGS: CT HEAD FINDINGS Brain: No evidence of hemorrhage or swelling. No acute infarct, mass, or hydrocephalus. CSF density mass in the right middle cranial fossa with local mass effect, up to 4.6 cm in transverse  span and 5.5 cm craniocaudal-consistent with arachnoid cyst. Generalized brain atrophy. There is dural-based calcification along the right cerebral convexity, question sequela of remote subdural hemorrhage Vascular: Atherosclerotic calcification. Skull: No calvarial fracture. CT MAXILLOFACIAL FINDINGS Osseous: Bilateral nasal arch fracture with comminution and mild depression on the right. Gas is seen in the anterior nasal septum where there is subtle fracture. No orbital or ethmoid continuation is seen. The mandible is intact and located. Orbits: Bilateral cataract resection. No evidence of postseptal injury. Sinuses: Expansive frontal sinuses. High-density frothy material in the right frontal sinus and right nasal cavity/nasopharynx is presumably epistaxis. In the inferior right frontal sinus is likely epistaxis Soft tissues: Soft tissue swelling about the nose. No opaque foreign body. CT CERVICAL SPINE FINDINGS Alignment: No traumatic malalignment. Skull base and vertebrae: Negative for acute fracture Soft tissues and spinal canal: No prevertebral fluid or swelling. No visible canal hematoma. Disc levels: Lower cervical degenerative disc narrowing and ridging. Generalized mild cervical facet spurring. Upper chest: Right-sided pleural effusion, reference dedicated chest CT IMPRESSION: 1. Comminuted bilateral nasal arch fracture with mild  depression. 2. Anterior and inferior nasal septum fracture. The fracture is nondisplaced but the septum is deviated to the left. 3. No evidence of intracranial or cervical spine injury. 4. Large arachnoid cyst in the right middle cranial fossa. Electronically Signed   By: Monte Fantasia M.D.   On: 10/10/2019 19:06   US Thoracentesis Asp Pleural Space W/img Guide  Result Date: 10/11/2019 INDICATION: Shortness of breath. Large right pleural effusion. Request for diagnostic and therapeutic thoracentesis. EXAM: ULTRASOUND GUIDED RIGHT THORACENTESIS MEDICATIONS: None. COMPLICATIONS: None immediate. Postprocedural chest x-ray negative for pneumothorax. PROCEDURE: An ultrasound guided thoracentesis was thoroughly discussed with the patient and questions answered. The benefits, risks, alternatives and complications were also discussed. The patient understands and wishes to proceed with the procedure. Written consent was obtained. Ultrasound of the right chest demonstrates large but severely loculated effusion with innumerable septations. Ultrasound was performed to localize and mark an adequate pocket of fluid in the right chest. The area was then prepped and draped in the normal sterile fashion. 1% Lidocaine was used for local anesthesia. Under ultrasound guidance a 6 Fr Safe-T-Centesis catheter was introduced. Thoracentesis was performed. Despite repositioning of the catheter multiple times, only approximately 525 mL was able to be removed. The catheter was removed and a dressing applied. FINDINGS: A total of approximately 525 mL of clear yellow fluid was removed. Samples were sent to the laboratory as requested by the clinical team. IMPRESSION: Large right, severely loculated pleural effusion. Successful ultrasound guided right thoracentesis yielding 525 mL of pleural fluid. Read by: Ascencion Dike PA-C Electronically Signed   By: Aletta Edouard M.D.   On: 10/11/2019 11:35        Scheduled Meds: .  Chlorhexidine Gluconate Cloth  6 each Topical Daily  . pulmozyme (DORNASE) for intrapleural administration  5 mg Intrapleural Q12H  . enoxaparin (LOVENOX) injection  40 mg Subcutaneous Q24H  . feeding supplement (PRO-STAT SUGAR FREE 64)  30 mL Oral BID  . insulin aspart  0-9 Units Subcutaneous Q4H   Continuous Infusions: . azithromycin Stopped (10/11/19 0013)  . ceFEPime (MAXIPIME) IV 2 g (10/12/19 1109)     LOS: 2 days    Time spent: 43min    Domenic Polite, MD [facility] 10/12/2019, 1:36 PM

## 2019-10-13 ENCOUNTER — Inpatient Hospital Stay (HOSPITAL_COMMUNITY): Payer: Medicare HMO

## 2019-10-13 DIAGNOSIS — J90 Pleural effusion, not elsewhere classified: Secondary | ICD-10-CM

## 2019-10-13 HISTORY — DX: Pleural effusion, not elsewhere classified: J90

## 2019-10-13 LAB — GLUCOSE, CAPILLARY
Glucose-Capillary: 109 mg/dL — ABNORMAL HIGH (ref 70–99)
Glucose-Capillary: 123 mg/dL — ABNORMAL HIGH (ref 70–99)
Glucose-Capillary: 125 mg/dL — ABNORMAL HIGH (ref 70–99)
Glucose-Capillary: 163 mg/dL — ABNORMAL HIGH (ref 70–99)
Glucose-Capillary: 170 mg/dL — ABNORMAL HIGH (ref 70–99)
Glucose-Capillary: 176 mg/dL — ABNORMAL HIGH (ref 70–99)
Glucose-Capillary: 211 mg/dL — ABNORMAL HIGH (ref 70–99)

## 2019-10-13 LAB — BASIC METABOLIC PANEL
Anion gap: 11 (ref 5–15)
BUN: 16 mg/dL (ref 8–23)
CO2: 23 mmol/L (ref 22–32)
Calcium: 8.2 mg/dL — ABNORMAL LOW (ref 8.9–10.3)
Chloride: 107 mmol/L (ref 98–111)
Creatinine, Ser: 0.88 mg/dL (ref 0.61–1.24)
GFR calc Af Amer: >60 mL/min (ref >60)
GFR calc non Af Amer: >60 mL/min (ref >60)
Glucose, Bld: 112 mg/dL — ABNORMAL HIGH (ref 70–99)
Potassium: 3.6 mmol/L (ref 3.5–5.1)
Sodium: 141 mmol/L (ref 135–145)

## 2019-10-13 LAB — CBC
HCT: 31.1 % — ABNORMAL LOW (ref 39.0–52.0)
Hemoglobin: 10 g/dL — ABNORMAL LOW (ref 13.0–17.0)
MCH: 27.3 pg (ref 26.0–34.0)
MCHC: 32.2 g/dL (ref 30.0–36.0)
MCV: 85 fL (ref 80.0–100.0)
Platelets: 326 10*3/uL (ref 150–400)
RBC: 3.66 MIL/uL — ABNORMAL LOW (ref 4.22–5.81)
RDW: 14.3 % (ref 11.5–15.5)
WBC: 14.1 10*3/uL — ABNORMAL HIGH (ref 4.0–10.5)
nRBC: 0 % (ref 0.0–0.2)

## 2019-10-13 LAB — ADENOVIRUS ANTIBODIES: Adenovirus Antibody: NEGATIVE

## 2019-10-13 LAB — PATHOLOGIST SMEAR REVIEW

## 2019-10-13 LAB — ACID FAST SMEAR (AFB, MYCOBACTERIA): Acid Fast Smear: NEGATIVE

## 2019-10-13 MED ORDER — ACETAMINOPHEN 325 MG PO TABS: 650.0000 mg | ORAL_TABLET | Freq: Four times a day (QID) | ORAL | Status: DC | PRN

## 2019-10-13 NOTE — Progress Notes (Addendum)
   NAME:  Frank Carlson, MRN:  [id number], DOB:  10/04/1941, LOS: 3 ADMISSION DATE:  10/10/2019, CONSULTATION DATE:  10/13/2019  REFERRING MD:  Domenic Polite, MD, CHIEF COMPLAINT:  Loculated pleural effusion  History of present illness   The patient is an 78 year old gentleman with past medical history of non-insulin-dependent diabetes and hyperlipidemia who presents after having a fall while walking in his neighborhood.  He was feeling short of breath while walking, and then fell forward.  He did sustain some abrasions to his nose and forehead with a small amount of bleeding.  He has a remote smoking history, quit over 60 years ago.  He denies having any pneumonia recently, but was having some nasal congestion and cough that was treated with doxycycline a few weeks ago.  He additionally reports having symptoms of cough earlier in the year around May or June which was attributed to mold.  He has not had any sick contacts, he has not had any Covid exposure.  He denies any fevers chills night sweats or weight loss.  His main issue is a dry cough and worsening dyspnea on exertion.  A chest x-ray demonstrated large right-sided loculated pleural effusion which was confirmed on CT chest.  He underwent ultrasound-guided thoracentesis in the IR with about 500 cc of serous fluid drained.  Pulmonary is being consulted to assist with management of the persistent loculated pleural effusion.  Subjective: No acute events.  Chest tube draining, 540 overnight.  CXR slight improvement.  Objective   Blood pressure 116/63, pulse 70, temperature 98.2 F (36.8 C), temperature source Oral, resp. rate 18, height 6' 0.5" (1.842 m), weight 88 kg, SpO2 98 %.        Intake/Output Summary (Last 24 hours) at 10/13/2019 1608 Last data filed at 10/13/2019 1400 Gross per 24 hour  Intake 1750 ml  Output 1265 ml  Net 485 ml   Filed Weights   10/11/19 0421 10/11/19 0600  Weight: 81.6 kg 88 kg    Examination: General:  alert, sitting up in bed watching TV, in NAD  HENT: bleeding nose  Lungs: diminished right base, ct in place  Cardiovascular: rrr, no mrg  Abdomen: soft, nt nd  Extremities: no edema  Neuro: AAOX2   Assessment & Plan:   Complex pleural effusion  Loculated pleural effusion, likely empyema - good response to tpa / dnase (2 doses) - - Continue chest tube to suction for now. - Will get chest CT to better characterize effusion / empyema and assess whether to leave chest tube with continued dosing of tpa/dnase vs pull tube - Continue abx.  Rest per primary   Montey Hora, PA - C [facility] 10/13/2019, 4:12 PM   PCCM Attending:   78 yo, complex effusion s/p 2 days tpa dnase  BP 116/63 (BP Location: Right Arm)   Pulse 63   Temp 98.7 F (37.1 C) (Oral)   Resp 19   Ht 6' 0.5" (1.842 m)   Wt 88 kg   SpO2 98%   BMI 25.96 kg/m   Comfortable Resting in bed RRR s1 s2  Right chest diminished breath sounds, no crackles  Left no wheeze  No edema  cxr reviewed, improved effusion   A: Complex loculated effusion   P: Repeat ct chest May need more tpa dnase  Await ct results  Garner Nash, DO [facility] 10/13/2019 7:11 PM

## 2019-10-13 NOTE — Progress Notes (Signed)
CT chest results in computer. PM RN made aware. STAT order from pulmonary. He will page MD to make aware. Pt resting with call bell within reach.  Payton Emerald, RN

## 2019-10-13 NOTE — Progress Notes (Addendum)
PROGRESS NOTE    Frank Carlson  [id number] DOB: [birth year] DOA: 10/10/2019 PCP: Venia Carbon, MD  Brief Narrative: Frank Carlson is an 78 year old male with history of type 2 diabetes mellitus, BPH, dyslipidemia presented to the ED following a near syncopal episode. -He reports dyspnea on exertion for the last 1 month, associated with nonproductive cough, he was treated with doxycycline about 3 weeks ago with some temporary improvement in symptoms which subsequently worsened. Yesterday 11/28 he was walking uphill when he became acutely dyspneic and fell forward, denied any loss of consciousness. -In the emergency room he was febrile to 102, white count of 17,000, noted to have nasal arch fracture, CT chest noted large R effusion -Underwent ultrasound-guided thoracentesis in radiology, 500 cc of clear fluid drained, IR PA in the note documented concern for loculations -PCCM consulted 11/29, underwent chest tube placement 11/29 and TPA/DNase   Assessment & Plan:   Loculated right pleural effusion -Empyema versus complex parapneumonic effusion, febrile to 102, WBC 17K on admission -Greatly appreciate PCCM assistance -Clinically improving, continue IV cefepime and azithromycin day 3  -Status post chest tube placement 11/29, treated with DNase/TPA x2 , output of 3.2 L following this, and an additional 540 mL last night  -Pleural fluid Gram stain and cultures negative so far, cytology with predominant neutrophilic inflammation, reactive mesothelial cells -Blood cultures also negative -Serial chest x-rays note improvement  Nasal bridge fracture -ENT consult appreciated -Recommended to follow-up with Dr.Teoh will likely not need any surgical intervention  Delirium/agitation 11/30 am -Patient was agitated and attempting to leave last night -No further issues, has been calm subsequently, off restraints, oriented x3 and appropriate   Mildly elevated LFTs -Hold statin, bili is  normal -Trend and monitor  Iron deficiency anemia -Given IV iron x1 -Outpatient gastroenterology follow-up for colonoscopy  Type 2 diabetes mellitus -Metformin on hold, CBGs are stable, hemoglobin A1c 7.1  BPH  Renal cysts noted on imaging  DVT prophylaxis: Lovenox Code Status: Full code Family Communication: No family at bedside, called and discussed with patient's wife 11/30 Disposition Plan: Home pending resolution of empyema  Consultants:   PCCM   Procedures: Ultrasound thoracentesis 11/29 500 mL drained  Chest tube placement Dr. Shearon Stalls 11/29  Antimicrobials:    Subjective: -No issues overnight, put out another 540 mL via chest tube  Objective: Vitals:   10/13/19 0424 10/13/19 0708 10/13/19 0902 10/13/19 1109  BP: (!) 110/58 118/68 (!) 116/59 106/67  Pulse: 73 74 80 69  Resp: 17 (!) 22 16 19   Temp: 99.4 F (37.4 C) 97.7 F (36.5 C) 98.1 F (36.7 C) 97.8 F (36.6 C)  TempSrc: Oral Oral Oral Oral  SpO2: 95% 97% 96% 97%  Weight:      Height:        Intake/Output Summary (Last 24 hours) at 10/13/2019 1419 Last data filed at 10/13/2019 1249 Gross per 24 hour  Intake 1610 ml  Output 1265 ml  Net 345 ml   Filed Weights   10/11/19 0421 10/11/19 0600  Weight: 81.6 kg 88 kg    Examination:  Gen: Elderly male, sitting up in bed, restraints of, awake alert oriented x3, no distress HEENT: Ecchymosis present bruising and swelling of the nasal bridge Lungs: Diminished breath sounds, right base, otherwise clear  CVS: S1-S2, regular rate rhythm Abd: soft, Non tender, non distended, BS present Extremities: No edema Skin: no new rashes Psychiatry: More appropriate mood and affect today    Data Reviewed:   CBC: Recent  Labs  Lab 10/10/19 1722 10/11/19 0235 10/12/19 0017 10/13/19 0259  WBC 17.5* 14.8*  --  14.1*  NEUTROABS 13.8*  --   --   --   HGB 10.9* 10.5* 11.4* 10.0*  HCT 33.8* 32.3*  31.5* 34.0* 31.1*  MCV 84.3 85.0  --  85.0  PLT 305 317   --  [id number]   Basic Metabolic Panel: Recent Labs  Lab 10/10/19 1722 10/11/19 0235 10/12/19 0256 10/13/19 0259  NA 137 140 139 141  K 3.9 3.7 4.0 3.6  CL 100 103 107 107  CO2 24 25 24 23   GLUCOSE 160* 107* 206* 112*  BUN 13 12 15 16   CREATININE 0.96 0.81 1.01 0.88  CALCIUM 8.5* 8.6* 8.3* 8.2*   GFR: Estimated Creatinine Clearance: 74.6 mL/min (by C-G formula based on SCr of 0.88 mg/dL). Liver Function Tests: Recent Labs  Lab 10/10/19 1722 10/11/19 0235  AST 120* 87*  ALT 145* 123*  ALKPHOS 119 114  BILITOT 1.6* 1.0  PROT 7.0 6.3*  6.5  ALBUMIN 2.2* 2.0*   No results for input(s): LIPASE, AMYLASE in the last 168 hours. No results for input(s): AMMONIA in the last 168 hours. Coagulation Profile: Recent Labs  Lab 10/10/19 1722  INR 1.1   Cardiac Enzymes: Recent Labs  Lab 10/11/19 0235  CKTOTAL 71  CKMB 3.2   BNP (last 3 results) No results for input(s): PROBNP in the last 8760 hours. HbA1C: Recent Labs    10/11/19 0235  HGBA1C 7.1*   CBG: Recent Labs  Lab 10/12/19 2001 10/13/19 0014 10/13/19 0425 10/13/19 0727 10/13/19 1111  GLUCAP 200* 109* 123* 125* 211*   Lipid Profile: Recent Labs    10/10/19 1722  TRIG 69   Thyroid Function Tests: No results for input(s): TSH, T4TOTAL, FREET4, T3FREE, THYROIDAB in the last 72 hours. Anemia Panel: Recent Labs    10/10/19 1722 10/11/19 0235  VITAMINB12  --  1,231*  FERRITIN 557*  --   TIBC  --  168*  IRON  --  11*   Urine analysis:    Component Value Date/Time   COLORURINE AMBER (A) 10/10/2019 1808   APPEARANCEUR HAZY (A) 10/10/2019 1808   LABSPEC 1.023 10/10/2019 1808   PHURINE 5.0 10/10/2019 1808   GLUCOSEU NEGATIVE 10/10/2019 1808   HGBUR NEGATIVE 10/10/2019 1808   HGBUR moderate 11/03/2009 0804   BILIRUBINUR NEGATIVE 10/10/2019 1808   BILIRUBINUR 2+ 11/16/2015 1227   KETONESUR NEGATIVE 10/10/2019 1808   PROTEINUR 30 (A) 10/10/2019 1808   UROBILINOGEN >=8.0 11/16/2015 1227   UROBILINOGEN  0.2 11/03/2009 0804   NITRITE NEGATIVE 10/10/2019 1808   LEUKOCYTESUR NEGATIVE 10/10/2019 1808   Sepsis Labs: @LABRCNTIP (procalcitonin:4,lacticidven:4)  ) Recent Results (from the past 240 hour(s))  Culture, blood (routine x 2)     Status: None (Preliminary result)   Collection Time: 10/10/19  5:15 PM   Specimen: BLOOD RIGHT ARM  Result Value Ref Range Status   Specimen Description BLOOD RIGHT ARM  Final   Special Requests   Final    BOTTLES DRAWN AEROBIC AND ANAEROBIC Blood Culture adequate volume   Culture   Final    NO GROWTH 3 DAYS Performed at [facility], [place] [address], [place], [place] [address]    Report Status PENDING  Incomplete  Culture, blood (routine x 2)     Status: None (Preliminary result)   Collection Time: 10/10/19  5:22 PM   Specimen: BLOOD  Result Value Ref Range Status   Specimen Description  BLOOD RIGHT ANTECUBITAL  Final   Special Requests   Final    BOTTLES DRAWN AEROBIC AND ANAEROBIC Blood Culture adequate volume   Culture   Final    NO GROWTH 3 DAYS Performed at [facility], [address] [place], [place] [address]    Report Status PENDING  Incomplete  Urine culture     Status: None   Collection Time: 10/10/19  6:08 PM   Specimen: Urine, Clean Catch  Result Value Ref Range Status   Specimen Description URINE, CLEAN CATCH  Final   Special Requests NONE  Final   Culture   Final    NO GROWTH Performed at [facility], [place] [address], [place], [place] [address]    Report Status 10/11/2019 FINAL  Final  SARS CORONAVIRUS 2 (TAT 6-24 HRS) Nasopharyngeal Nasopharyngeal Swab     Status: None   Collection Time: 10/10/19  9:56 PM   Specimen: Nasopharyngeal Swab  Result Value Ref Range Status   SARS Coronavirus 2 NEGATIVE NEGATIVE Final    Comment: (NOTE) SARS-CoV-2 target nucleic acids are NOT DETECTED. The SARS-CoV-2 RNA is generally detectable in upper and lower respiratory specimens during the acute phase of infection.  Negative results do not preclude SARS-CoV-2 infection, do not rule out co-infections with other pathogens, and should not be used as the sole basis for treatment or other patient management decisions. Negative results must be combined with clinical observations, patient history, and epidemiological information. The expected result is Negative. Fact Sheet for Patients: SugarRoll.be Fact Sheet for Healthcare Providers: https://www.woods-mathews.com/ This test is not yet approved or cleared by the [place] FDA and  has been authorized for detection and/or diagnosis of SARS-CoV-2 by FDA under an Emergency Use Authorization (EUA). This EUA will remain  in effect (meaning this test can be used) for the duration of the COVID-19 declaration under Section 56 4(b)(1) of the Act, 21 U.S.C. section 360bbb-3(b)(1), unless the authorization is terminated or revoked sooner. Performed at [facility], [place] [address] [place], [place] [address]   Gram stain     Status: None   Collection Time: 10/11/19 11:03 AM   Specimen: PATH Cytology Pleural fluid  Result Value Ref Range Status   Specimen Description PLEURAL RIGHT  Final   Special Requests NONE  Final   Gram Stain   Final    FEW WBC PRESENT, PREDOMINANTLY PMN NO ORGANISMS SEEN Performed at [facility], [place] [address], [place], [place] [address]    Report Status 10/11/2019 FINAL  Final  Acid Fast Smear (AFB)     Status: None   Collection Time: 10/11/19 11:03 AM   Specimen: PATH Cytology Pleural fluid  Result Value Ref Range Status   AFB Specimen Processing Concentration  Final   Acid Fast Smear Negative  Final    Comment: (NOTE) Performed At: [facility] [place], [place] [id number] Rush Farmer MD [id number]    Source (AFB) PLEURAL  Final    Comment: RIGHT Performed at [facility], [place] [address], [place], [place] [address]   Culture, body  fluid-bottle     Status: None (Preliminary result)   Collection Time: 10/11/19 11:03 AM   Specimen: Pleura  Result Value Ref Range Status   Specimen Description PLEURAL RIGHT  Final   Special Requests NONE  Final   Culture   Final    NO GROWTH 2 DAYS Performed at [facility]  [address], [place], [place] [address]    Report Status PENDING  Incomplete  Culture, body fluid-bottle     Status: None (Preliminary result)   Collection Time: 10/11/19  3:27 PM   Specimen: Pleura  Result Value Ref Range Status   Specimen Description PLEURAL  Final   Special Requests NONE  Final   Culture   Final    NO GROWTH 2 DAYS Performed at [facility], [address] [place], [place] [address]    Report Status PENDING  Incomplete  Gram stain     Status: None   Collection Time: 10/11/19  3:27 PM   Specimen: Pleura  Result Value Ref Range Status   Specimen Description PLEURAL  Final   Special Requests NONE  Final   Gram Stain   Final    RARE WBC PRESENT, PREDOMINANTLY PMN NO ORGANISMS SEEN Performed at [facility], [place] [address] [place],  [address]    Report Status 10/11/2019 FINAL  Final         Radiology Studies: Dg Chest Port 1 View  Result Date: 10/13/2019 CLINICAL DATA:  Pleural effusion. EXAM: PORTABLE CHEST 1 VIEW COMPARISON:  October 12, 2019. FINDINGS: The heart size and mediastinal contours are within normal limits. Left lung is clear. Stable position of right-sided chest tube with small right pleural effusion. No pneumothorax is noted. Stable right basilar atelectasis or infiltrate is noted. The visualized skeletal structures are unremarkable. IMPRESSION: Stable position of right-sided chest tube with small right pleural effusion. Stable right basilar atelectasis or infiltrate is noted. No pneumothorax is noted. Electronically Signed   By: Marijo Conception M.D.   On: 10/13/2019 08:08   Dg Chest Port 1 View  Result Date: 10/12/2019 CLINICAL DATA:   Chest tube. EXAM: PORTABLE CHEST 1 VIEW COMPARISON:  10/11/2019. FINDINGS: Right chest tube noted with tip over the lower right chest. Interval improvement of right pleural effusion. No pneumothorax noted. Right base atelectatic changes and/or infiltrate noted. Calcified pulmonary nodules again noted consistent granulomas. Heart size stable. IMPRESSION: 1. Right chest tube noted with tip over the lower right chest. Interval improvement of right pleural effusion. No pneumothorax noted. 2. Right base atelectatic changes and/or infiltrate noted. Electronically Signed   By: Marcello Moores  Register   On: 10/12/2019 07:10   Portable Chest  Result Date: 10/11/2019 CLINICAL DATA:  Chest tube placement EXAM: PORTABLE CHEST 1 VIEW COMPARISON:  October 11, 2019 FINDINGS: A right chest tube is been placed in the interval. A moderate right pleural effusion with underlying opacity remains. No pneumothorax. Granuloma are seen in the left lung. No other changes. IMPRESSION: A right chest tube has been placed in the interval, in good position. A moderate right effusion with underlying opacity remains. No pneumothorax. Electronically Signed   By: Dorise Bullion III M.D   On: 10/11/2019 15:17        Scheduled Meds: . Chlorhexidine Gluconate Cloth  6 each Topical Daily  . enoxaparin (LOVENOX) injection  40 mg Subcutaneous Q24H  . feeding supplement (PRO-STAT SUGAR FREE 64)  30 mL Oral BID  . insulin aspart  0-9 Units Subcutaneous Q4H   Continuous Infusions: . azithromycin 500 mg (10/12/19 2315)  . ceFEPime (MAXIPIME) IV 2 g (10/13/19 1310)     LOS: 3 days    Time spent: 42min    Domenic Polite, MD [facility] 10/13/2019, 2:19 PM

## 2019-10-14 DIAGNOSIS — J90 Pleural effusion, not elsewhere classified: Secondary | ICD-10-CM | POA: Diagnosis not present

## 2019-10-14 LAB — COMPREHENSIVE METABOLIC PANEL
ALT: 147 U/L — ABNORMAL HIGH (ref 0–44)
AST: 112 U/L — ABNORMAL HIGH (ref 15–41)
Albumin: 1.6 g/dL — ABNORMAL LOW (ref 3.5–5.0)
Alkaline Phosphatase: 80 U/L (ref 38–126)
Anion gap: 9 (ref 5–15)
BUN: 13 mg/dL (ref 8–23)
CO2: 22 mmol/L (ref 22–32)
Calcium: 7.8 mg/dL — ABNORMAL LOW (ref 8.9–10.3)
Chloride: 106 mmol/L (ref 98–111)
Creatinine, Ser: 0.72 mg/dL (ref 0.61–1.24)
GFR calc Af Amer: >60 mL/min (ref >60)
GFR calc non Af Amer: >60 mL/min (ref >60)
Glucose, Bld: 138 mg/dL — ABNORMAL HIGH (ref 70–99)
Potassium: 3.6 mmol/L (ref 3.5–5.1)
Sodium: 137 mmol/L (ref 135–145)
Total Bilirubin: 0.2 mg/dL — ABNORMAL LOW (ref 0.3–1.2)
Total Protein: 5.4 g/dL — ABNORMAL LOW (ref 6.5–8.1)

## 2019-10-14 LAB — GLUCOSE, CAPILLARY
Glucose-Capillary: 132 mg/dL — ABNORMAL HIGH (ref 70–99)
Glucose-Capillary: 153 mg/dL — ABNORMAL HIGH (ref 70–99)
Glucose-Capillary: 125 mg/dL — ABNORMAL HIGH (ref 70–99)
Glucose-Capillary: 107 mg/dL — ABNORMAL HIGH (ref 70–99)
Glucose-Capillary: 186 mg/dL — ABNORMAL HIGH (ref 70–99)

## 2019-10-14 LAB — CBC
HCT: 29.8 % — ABNORMAL LOW (ref 39.0–52.0)
Hemoglobin: 9.8 g/dL — ABNORMAL LOW (ref 13.0–17.0)
MCH: 27.6 pg (ref 26.0–34.0)
MCHC: 32.9 g/dL (ref 30.0–36.0)
MCV: 83.9 fL (ref 80.0–100.0)
Platelets: 358 10*3/uL (ref 150–400)
RBC: 3.55 MIL/uL — ABNORMAL LOW (ref 4.22–5.81)
RDW: 14.1 % (ref 11.5–15.5)
WBC: 12 10*3/uL — ABNORMAL HIGH (ref 4.0–10.5)
nRBC: 0 % (ref 0.0–0.2)

## 2019-10-14 LAB — PH, BODY FLUID: pH, Body Fluid: 7.2

## 2019-10-14 MED ORDER — INSULIN ASPART 100 UNIT/ML ~~LOC~~ SOLN
0.0000 [IU] | Freq: Three times a day (TID) | SUBCUTANEOUS | Status: DC
  Administered 2019-10-14: 22:00:00 2 [IU] via SUBCUTANEOUS
  Administered 2019-10-14: 12:00:00 1 [IU] via SUBCUTANEOUS
  Administered 2019-10-15: 23:00:00 2 [IU] via SUBCUTANEOUS
  Administered 2019-10-15: 1 [IU] via SUBCUTANEOUS

## 2019-10-14 NOTE — Progress Notes (Signed)
NAME:  Frank Carlson, MRN:  [id number], DOB:  09/17/1941, LOS: 4 ADMISSION DATE:  10/10/2019, CONSULTATION DATE:  12/1 REFERRING MD:  Domenic Polite, CHIEF COMPLAINT:  Loculated pleural effusion    Brief History   78 yo presented post mechanical fall, found to havearge right-sided loculated pleural effusion which was confirmed on CT chest.  He underwent ultrasound-guided thoracentesis in the IR with about 500 cc of serous fluid drained.  History of present illness   The patient is an 78 year old gentleman with past medical history of non-insulin-dependent diabetes and hyperlipidemia who presents after having a fall while walking in his neighborhood.  He was feeling short of breath while walking, and then fell forward.  He did sustain some abrasions to his nose and forehead with a small amount of bleeding.  He has a remote smoking history, quit over 60 years ago.  He denies having any pneumonia recently, but was having some nasal congestion and cough that was treated with doxycycline a few weeks ago.  He additionally reports having symptoms of cough earlier in the year around May or June which was attributed to mold.  He has not had any sick contacts, he has not had any Covid exposure.  He denies any fevers chills night sweats or weight loss.  His main issue is a dry cough and worsening dyspnea on exertion.   A chest x-ray demonstrated large right-sided loculated pleural effusion which was confirmed on CT chest.  He underwent ultrasound-guided thoracentesis in the IR with about 500 cc of serous fluid drained.  Pulmonary is being consulted to assist with management of the persistent loculated pleural effusion.  Past Medical History  Type 2 diabetes  Hyperlipidemia Color blindness BPH  Significant Hospital Events   11/28 Admitted   Consults:  PCCM  IR  Procedures:  11/29 > US guided right thoracentesis, placement of right chest tube with insilation of interpleural fibrinolytic    Significant Diagnostic Tests:  CT angio chest 11/28 > No PE. Large right pleural effusion with associated mass effect andcomplete compressive atelectasis of the right lower lobe and atelectasis of the majority of the right middle lobe. Pneumonia or underlying mass is not excluded.   CT head and maxillofacial 11/28 > Comminuted bilateral nasal arch fracture with mild depression. Anterior and inferior nasal septum fracture. The fracture is nondisplaced but the septum is deviated to the left. No evidence of intracranial or cervical spine injury. Large arachnoid cyst in the right middle cranial fossa.  Chest CT 12/1 > Marked interval decrease in size of the large right-sided pleural effusion, perhaps empyema, with a chest tube in place. A thin ring of fluid remains mix with locules of gas along with a small amount of fluid in the dependent right lower chest. Improved aeration of the right middle and lower lobe still with some lateral middle lobe consolidation and posterior lower lobe consolidation. Continued follow-up is suggested to ensure resolution. Along the exit site for the catheter there is some mild kinking of the catheter as it passes inferiorly along the right chest wall. Correlate with catheter function.   Micro Data:  RVP 11/28 > Bordetella PCR 11/28 > Pleural culture 11/29>  AFB smear 11/29 > Urine culture 11/28 > neg Pleura grams stain > neg Blood culture 11/29 > neg  Antimicrobials:  Cefepime 11/28 >   Interim history/subjective:  Sitting up in bed with no acute complains, states he feels much better with 75% improvement compared to admission.   Objective  Blood pressure 131/78, pulse 74, temperature 98.1 F (36.7 C), temperature source Oral, resp. rate 14, height 6' 0.5" (1.842 m), weight 84.8 kg, SpO2 96 %.        Intake/Output Summary (Last 24 hours) at 10/14/2019 1425 Last data filed at 10/14/2019 1200 Gross per 24 hour  Intake 780 ml  Output 1085 ml  Net -305 ml    Filed Weights   10/11/19 0421 10/11/19 0600 10/14/19 0300  Weight: 81.6 kg 88 kg 84.8 kg    Examination: General: Elderly adult malen sitting up in bed, in NAD HEENT: Scattered bruising and scabs to forehead and nose, MM pink/moist, PERRL,  Neuro: Alert and oriented x3, non focal, able to follow all commands  CV: s1s2 regular rate and rhythm, no murmur, rubs, or gallops,  PULM:  Clear to ascultation bilaterally, pigtail chest tube in place with fibrin strain seen in tubing GI: soft, bowel sounds active in all 4 quadrants, non-tender, non-distended Extremities: warm/dry, no edema  Skin: no rashes or lesions   Resolved Hospital Problem list     Assessment & Plan:  Complex pleural effusion  Loculated pleural effusion, likely empyema - good response to tpa / dnase (2 doses) P: Continue with chest tube, place to waterseal   Continue IV antibiotics  Repeat CT with marked decrease in size of the large right-sided pleural Appears pigtail chest tube has not been flushed, will add scheduled flushed now and overnight, if output remains minimal post flushed chest tube can be removed tomorrow   Rest per primary  Best practice:  Diet: Heart healthy Pain/Anxiety/Delirium protocol (if indicated): N/A VAP protocol (if indicated): N/A DVT prophylaxis: Lovenox GI prophylaxis: PPI Glucose control: monitor Mobility: up with assistance  Code Status: Full Family Communication: Per primary  Disposition: Progressive care   Labs   CBC: Recent Labs  Lab 10/10/19 1722 10/11/19 0235 10/12/19 0017 10/13/19 0259 10/14/19 0238  WBC 17.5* 14.8*  --  14.1* 12.0*  NEUTROABS 13.8*  --   --   --   --   HGB 10.9* 10.5* 11.4* 10.0* 9.8*  HCT 33.8* 32.3*  31.5* 34.0* 31.1* 29.8*  MCV 84.3 85.0  --  85.0 83.9  PLT 305 317  --  326 [id number]    Basic Metabolic Panel: Recent Labs  Lab 10/10/19 1722 10/11/19 0235 10/12/19 0256 10/13/19 0259 10/14/19 0238  NA 137 140 139 141 137  K 3.9 3.7 4.0  3.6 3.6  CL 100 103 107 107 106  CO2 24 25 24 23 22   GLUCOSE 160* 107* 206* 112* 138*  BUN 13 12 15 16 13   CREATININE 0.96 0.81 1.01 0.88 0.72  CALCIUM 8.5* 8.6* 8.3* 8.2* 7.8*   GFR: Estimated Creatinine Clearance: 82.1 mL/min (by C-G formula based on SCr of 0.72 mg/dL). Recent Labs  Lab 10/10/19 1722 10/10/19 2116 10/11/19 0235 10/13/19 0259 10/14/19 0238  PROCALCITON 0.27  --   --   --   --   WBC 17.5*  --  14.8* 14.1* 12.0*  LATICACIDVEN 1.9 1.0  --   --   --     Liver Function Tests: Recent Labs  Lab 10/10/19 1722 10/11/19 0235 10/14/19 0238  AST 120* 87* 112*  ALT 145* 123* 147*  ALKPHOS 119 114 80  BILITOT 1.6* 1.0 0.2*  PROT 7.0 6.3*  6.5 5.4*  ALBUMIN 2.2* 2.0* 1.6*   No results for input(s): LIPASE, AMYLASE in the last 168 hours. No results for input(s): AMMONIA in the last 168  hours.  ABG    Component Value Date/Time   TCO2 28 06/15/2015 1520     Coagulation Profile: Recent Labs  Lab 10/10/19 1722  INR 1.1    Cardiac Enzymes: Recent Labs  Lab 10/11/19 0235  CKTOTAL 71  CKMB 3.2    HbA1C: Hgb A1c MFr Bld  Date/Time Value Ref Range Status  10/11/2019 02:35 AM 7.1 (H) 4.8 - 5.6 % Final    Comment:    (NOTE) Pre diabetes:          5.7%-6.4% Diabetes:              >6.4% Glycemic control for   <7.0% adults with diabetes   04/28/2019 09:12 AM 6.5 4.6 - 6.5 % Final    Comment:    Glycemic Control Guidelines for People with Diabetes:Non Diabetic:  <6%Goal of Therapy: <7%Additional Action Suggested:  >8%     CBG: Recent Labs  Lab 10/13/19 2026 10/13/19 2353 10/14/19 0433 10/14/19 0758 10/14/19 1117  GLUCAP 176* 170* 132* 153* 125*   Signature:    Johnsie Cancel, NP-C [facility] After hours pager: [phone]. 10/14/2019, 3:51 PM

## 2019-10-14 NOTE — Progress Notes (Signed)
Pharmacy Antibiotic Note  Frank Carlson is a 78 y.o. male on day # 5 Cefepime and Azithromycin for pneumonia/R-sided loculated pleural effusion/empyema. S/p thoracentesis and chest tube placed 11/29.  S/p tPA/Dornase alpha on 11/29 and 11/30.  Cefepime dose remains appropriate for renal function.  Tmax 100.5, WBC trending down.  Cultures negative to date.  Plan: Continue Cefepime 2gm IV q8hrs. Also on Azithromycin 500 mg IV q24hrs  Follow renal function, final culture data, antibiotic plans.  Height: 6' 0.5" (184.2 cm) Weight: 186 lb 15.2 oz (84.8 kg)(scale B) IBW/kg (Calculated) : 78.75  Temp (24hrs), Avg:98.9 F (37.2 C), Min:97.7 F (36.5 C), Max:100.3 F (37.9 C)  Recent Labs  Lab 10/10/19 1722 10/10/19 2116 10/11/19 0235 10/12/19 0256 10/13/19 0259 10/14/19 0238  WBC 17.5*  --  14.8*  --  14.1* 12.0*  CREATININE 0.96  --  0.81 1.01 0.88 0.72  LATICACIDVEN 1.9 1.0  --   --   --   --     Estimated Creatinine Clearance: 82.1 mL/min (by C-G formula based on SCr of 0.72 mg/dL).    Allergies  Allergen Reactions  . Bee Venom Other (See Comments)    Passed out    Antimicrobials this admission: Vanc 11/28 >> 11/29 (received x 1 on 11/28 only) Cefepime 11/28 >> Azithromycin 11/28 >>  Dose adjustments this admission:  n/a  Microbiology results: 11/29 pleural fluid: no growth x 3 days to date 11/28 Blood x 2: no growth x 4 days to date 11/28 Bordetella pertussis PCR: sent 11/28 Urine: negative 11/28 Legionella: negative 11/28 Strep pneumo Ag: negative 11/28 COVID: negative 11/29 AFB smear: negative 11/29 Adenovirus: negative  Thank you for allowing pharmacy to be a part of this patient's care.  Arty Baumgartner, [place] Pager: [phone] or phone: [phone] 10/14/2019 12:46 PM

## 2019-10-14 NOTE — Progress Notes (Addendum)
PROGRESS NOTE    STYLES HENNESSEE  [id number] DOB: 1940-12-16 DOA: 10/10/2019 PCP: Venia Carbon, MD  Brief Narrative:  78 year old male with history of type 2 diabetes mellitus, BPH, dyslipidemia presented to the ED following a near syncopal episode. -He reports dyspnea on exertion for the last 1 month, associated with nonproductive cough, he was treated with doxycycline about 3 weeks ago with some temporary improvement in symptoms which subsequently worsened. 11/28 he was walking uphill when he became acutely dyspneic and fell forward, denied any loss of consciousness. -In the emergency room he was febrile to 102, white count of 17,000, noted to have nasal arch fracture, CT chest noted large R effusion -Underwent ultrasound-guided thoracentesis in radiology, 500 cc of clear fluid drained, IR PA in the note documented concern for loculations -PCCM consulted 11/29, underwent chest tube placement 11/29 and TPA/DNase   Assessment & Plan:   Loculated right pleural effusion -Empyema versus complex parapneumonic effusion, febrile to 102, WBC 17K on admission -Clinically improving, continue IV cefepime and azithromycin day 3  -Status post chest tube placement 11/29, treated with DNase/TPA x2 , output 3.8 L -Pleural fluid Gram stain and cultures negative so far, cytology with predominant neutrophilic inflammation, reactive mesothelial cells -Blood cultures also negative -Serial chest x-rays note improvement-CT chest 12/1 shows interval decrease in size R effusion and improved aeration overall-defer further management to pulmonary  Nasal bridge fracture -ENT consult appreciated -Recommended to follow-up with Dr.Teoh will likely not need any surgical intervention  Delirium/agitation 11/30 am -Patient was agitated and attempting to leave last night -No further issues, has been calm subsequently, off restraints, oriented x3 and appropriate   Mildly elevated LFTs -Hold statin, bili is  normal -Trend and monitor  Iron deficiency anemia -Given IV iron x1 -Outpatient gastroenterology follow-up for colonoscopy  Type 2 diabetes mellitus -Metformin on hold, CBGs 1 25-1 50 hemoglobin A1c 7.1  BPH  Renal cysts noted on imaging  DVT prophylaxis: Lovenox Code Status: Full code Family Communication: N discussed with wife at bedside Disposition Plan: Home pending resolution of empyema  Consultants:   PCCM   Procedures: Ultrasound thoracentesis 11/29 500 mL drained  Chest tube placement Dr. Shearon Stalls 11/29  Antimicrobials:    Subjective: Doing well no shortness of breath no nausea no vomiting shortness of breath is much improved there is scant drainage from chest tube  Objective: Vitals:   10/13/19 2351 10/14/19 0300 10/14/19 0747 10/14/19 1220  BP: 125/62 116/64 116/62 131/78  Pulse: 70 78 79 74  Resp: 18 18 15 14   Temp: 98.1 F (36.7 C) 100.3 F (37.9 C) 97.7 F (36.5 C) 98.1 F (36.7 C)  TempSrc: Oral Oral Oral Oral  SpO2: 98% 95% 97% 96%  Weight:  84.8 kg    Height:        Intake/Output Summary (Last 24 hours) at 10/14/2019 1356 Last data filed at 10/14/2019 1200 Gross per 24 hour  Intake 1020 ml  Output 1085 ml  Net -65 ml   Filed Weights   10/11/19 0421 10/11/19 0600 10/14/19 0300  Weight: 81.6 kg 88 kg 84.8 kg    Examination:  EOMI NCAT no focal deficit large bruise to forehead Chest clinically clear bilaterally pleural tube.  Right hemithorax S1-S2 no murmur rub or gallop Abdomen soft nontender no rebound no Neurologically intact no focal deficit Extremity edema no   Data Reviewed:   CBC: Recent Labs  Lab 10/10/19 1722 10/11/19 0235 10/12/19 0017 10/13/19 0259 10/14/19 0238  WBC 17.5*  14.8*  --  14.1* 12.0*  NEUTROABS 13.8*  --   --   --   --   HGB 10.9* 10.5* 11.4* 10.0* 9.8*  HCT 33.8* 32.3*  31.5* 34.0* 31.1* 29.8*  MCV 84.3 85.0  --  85.0 83.9  PLT 305 317  --  326 123456   Basic Metabolic Panel: Recent Labs  Lab  10/10/19 1722 10/11/19 0235 10/12/19 0256 10/13/19 0259 10/14/19 0238  NA 137 140 139 141 137  K 3.9 3.7 4.0 3.6 3.6  CL 100 103 107 107 106  CO2 24 25 24 23 22   GLUCOSE 160* 107* 206* 112* 138*  BUN 13 12 15 16 13   CREATININE 0.96 0.81 1.01 0.88 0.72  CALCIUM 8.5* 8.6* 8.3* 8.2* 7.8*   GFR: Estimated Creatinine Clearance: 82.1 mL/min (by C-G formula based on SCr of 0.72 mg/dL). Liver Function Tests: Recent Labs  Lab 10/10/19 1722 10/11/19 0235 10/14/19 0238  AST 120* 87* 112*  ALT 145* 123* 147*  ALKPHOS 119 114 80  BILITOT 1.6* 1.0 0.2*  PROT 7.0 6.3*  6.5 5.4*  ALBUMIN 2.2* 2.0* 1.6*   No results for input(s): LIPASE, AMYLASE in the last 168 hours. No results for input(s): AMMONIA in the last 168 hours. Coagulation Profile: Recent Labs  Lab 10/10/19 1722  INR 1.1   Cardiac Enzymes: Recent Labs  Lab 10/11/19 0235  CKTOTAL 71  CKMB 3.2   BNP (last 3 results) No results for input(s): PROBNP in the last 8760 hours. HbA1C: No results for input(s): HGBA1C in the last 72 hours. CBG: Recent Labs  Lab 10/13/19 2026 10/13/19 2353 10/14/19 0433 10/14/19 0758 10/14/19 1117  GLUCAP 176* 170* 132* 153* 125*   Lipid Profile: No results for input(s): CHOL, HDL, LDLCALC, TRIG, CHOLHDL, LDLDIRECT in the last 72 hours. Thyroid Function Tests: No results for input(s): TSH, T4TOTAL, FREET4, T3FREE, THYROIDAB in the last 72 hours. Anemia Panel: No results for input(s): VITAMINB12, FOLATE, FERRITIN, TIBC, IRON, RETICCTPCT in the last 72 hours. Urine analysis:    Component Value Date/Time   COLORURINE AMBER (A) 10/10/2019 1808   APPEARANCEUR HAZY (A) 10/10/2019 1808   LABSPEC 1.023 10/10/2019 1808   PHURINE 5.0 10/10/2019 1808   GLUCOSEU NEGATIVE 10/10/2019 1808   HGBUR NEGATIVE 10/10/2019 1808   HGBUR moderate 11/03/2009 0804   BILIRUBINUR NEGATIVE 10/10/2019 1808   BILIRUBINUR 2+ 11/16/2015 1227   KETONESUR NEGATIVE 10/10/2019 1808   PROTEINUR 30 (A)  10/10/2019 1808   UROBILINOGEN >=8.0 11/16/2015 1227   UROBILINOGEN 0.2 11/03/2009 0804   NITRITE NEGATIVE 10/10/2019 1808   LEUKOCYTESUR NEGATIVE 10/10/2019 1808   Sepsis Labs: @LABRCNTIP (procalcitonin:4,lacticidven:4)  ) Recent Results (from the past 240 hour(s))  Culture, blood (routine x 2)     Status: None (Preliminary result)   Collection Time: 10/10/19  5:15 PM   Specimen: BLOOD RIGHT ARM  Result Value Ref Range Status   Specimen Description BLOOD RIGHT ARM  Final   Special Requests   Final    BOTTLES DRAWN AEROBIC AND ANAEROBIC Blood Culture adequate volume   Culture   Final    NO GROWTH 4 DAYS Performed at [facility], [place] [address], [place], [place] [address]    Report Status PENDING  Incomplete  Culture, blood (routine x 2)     Status: None (Preliminary result)   Collection Time: 10/10/19  5:22 PM   Specimen: BLOOD  Result Value Ref Range Status   Specimen Description BLOOD RIGHT ANTECUBITAL  Final  Special Requests   Final    BOTTLES DRAWN AEROBIC AND ANAEROBIC Blood Culture adequate volume   Culture   Final    NO GROWTH 4 DAYS Performed at [facility], [place] [address] [place], [place] [address]    Report Status PENDING  Incomplete  Urine culture     Status: None   Collection Time: 10/10/19  6:08 PM   Specimen: Urine, Clean Catch  Result Value Ref Range Status   Specimen Description URINE, CLEAN CATCH  Final   Special Requests NONE  Final   Culture   Final    NO GROWTH Performed at [facility], [place] [address], [place], [place] [address]    Report Status 10/11/2019 FINAL  Final  SARS CORONAVIRUS 2 (TAT 6-24 HRS) Nasopharyngeal Nasopharyngeal Swab     Status: None   Collection Time: 10/10/19  9:56 PM   Specimen: Nasopharyngeal Swab  Result Value Ref Range Status   SARS Coronavirus 2 NEGATIVE NEGATIVE Final    Comment: (NOTE) SARS-CoV-2 target nucleic acids are NOT DETECTED. The SARS-CoV-2 RNA is generally detectable in upper and  lower respiratory specimens during the acute phase of infection. Negative results do not preclude SARS-CoV-2 infection, do not rule out co-infections with other pathogens, and should not be used as the sole basis for treatment or other patient management decisions. Negative results must be combined with clinical observations, patient history, and epidemiological information. The expected result is Negative. Fact Sheet for Patients: SugarRoll.be Fact Sheet for Healthcare Providers: https://www.woods-mathews.com/ This test is not yet approved or cleared by the [place] FDA and  has been authorized for detection and/or diagnosis of SARS-CoV-2 by FDA under an Emergency Use Authorization (EUA). This EUA will remain  in effect (meaning this test can be used) for the duration of the COVID-19 declaration under Section 56 4(b)(1) of the Act, 21 U.S.C. section 360bbb-3(b)(1), unless the authorization is terminated or revoked sooner. Performed at [facility], [place] [address], [place], [place] [address]   Gram stain     Status: None   Collection Time: 10/11/19 11:03 AM   Specimen: PATH Cytology Pleural fluid  Result Value Ref Range Status   Specimen Description PLEURAL RIGHT  Final   Special Requests NONE  Final   Gram Stain   Final    FEW WBC PRESENT, PREDOMINANTLY PMN NO ORGANISMS SEEN Performed at [facility], [place] [address] [place], [place] [address]    Report Status 10/11/2019 FINAL  Final  Acid Fast Smear (AFB)     Status: None   Collection Time: 10/11/19 11:03 AM   Specimen: PATH Cytology Pleural fluid  Result Value Ref Range Status   AFB Specimen Processing Concentration  Final   Acid Fast Smear Negative  Final    Comment: (NOTE) Performed At: [facility] [place], [place] [id number] Rush Farmer MD [id number]    Source (AFB) PLEURAL  Final    Comment: RIGHT Performed at [facility], [place] [address], [place], [place] [address]   Culture, body fluid-bottle     Status: None (Preliminary result)   Collection Time: 10/11/19 11:03 AM   Specimen: Pleura  Result Value Ref Range Status   Specimen Description PLEURAL RIGHT  Final   Special Requests NONE  Final   Culture   Final    NO GROWTH 3 DAYS Performed at [place] [address], [place],  [address]  Report Status PENDING  Incomplete  Culture, body fluid-bottle     Status: None (Preliminary result)   Collection Time: 10/11/19  3:27 PM   Specimen: Pleura  Result Value Ref Range Status   Specimen Description PLEURAL  Final   Special Requests NONE  Final   Culture   Final    NO GROWTH 3 DAYS Performed at [facility], [address], [place], [place] [address]    Report Status PENDING  Incomplete  Gram stain     Status: None   Collection Time: 10/11/19  3:27 PM   Specimen: Pleura  Result Value Ref Range Status   Specimen Description PLEURAL  Final   Special Requests NONE  Final   Gram Stain   Final    RARE WBC PRESENT, PREDOMINANTLY PMN NO ORGANISMS SEEN Performed at [facility], [place] [address], [place], [place] [address]    Report Status 10/11/2019 FINAL  Final         Radiology Studies: Ct Chest Wo Contrast  Result Date: 10/13/2019 CLINICAL DATA:  Post recent chest tube placement, evaluate pleural effusion. EXAM: CT CHEST WITHOUT CONTRAST TECHNIQUE: Multidetector CT imaging of the chest was performed following the standard protocol without IV contrast. COMPARISON:  10/10/2019 CT angiography of the chest and chest x-ray of 10/13/2019 FINDINGS: Cardiovascular: Scattered atherosclerosis. No signs of dilation of the thoracic aorta. Heart size is stable without pericardial effusion. Central pulmonary vasculature is of normal caliber. Mediastinum/Nodes: Scattered small lymph nodes in the chest are unchanged. Thoracic inlet structures are normal. Lungs/Pleura: Marked  interval decrease in size of a large right-sided pleural effusion that was seen on the previous CT chest. Right-sided chest tube along the periphery of the right chest. No clinically significant pneumothorax with scattered locules of gas within residual pleural fluid contained within the rind of fluid along the periphery of the right chest. There is a small area of dependent fluid measuring approximately 7.8 x 3.4 cm (image 107, series 4) there is improved aeration of right middle and lower lobe still with some lateral middle lobe consolidation and posterior lower lobe consolidation. Airways are patent. Calcified granuloma in the left chest in the lingula and another in the left upper lobe. Upper Abdomen: Cholelithiasis. No signs of acute process in the upper abdomen. Presumed cyst arising from anterior right kidney is incompletely imaged but was seen on the prior CT examination from 2017. Musculoskeletal: No signs of acute bone finding or destructive bone process. Signs of spinal degenerative change. Along the exit site for the catheter there is some mild kinking of the catheter as it passes inferiorly along the right chest wall. IMPRESSION: 1. Marked interval decrease in size of the large right-sided pleural effusion, perhaps empyema, with a chest tube in place. A thin rind of fluid remains mix with locules of gas along with a small amount of fluid in the dependent right lower chest. 2. Improved aeration of the right middle and lower lobe still with some lateral middle lobe consolidation and posterior lower lobe consolidation. Continued follow-up is suggested to ensure resolution. 3. Along the exit site for the catheter there is some mild kinking of the catheter as it passes inferiorly along the right chest wall. Correlate with catheter function. 4. Cholelithiasis. 5. Aortic atherosclerosis. 6. These results will be called to the ordering clinician or representative by the Radiologist Assistant, and communication  documented in the PACS or zVision Dashboard. Aortic Atherosclerosis (ICD10-I70.0). Electronically Signed   By: Cay Schillings  Wile M.D.   On: 10/13/2019 19:30   Dg Chest Port 1 View  Result Date: 10/13/2019 CLINICAL DATA:  Pleural effusion. EXAM: PORTABLE CHEST 1 VIEW COMPARISON:  October 12, 2019. FINDINGS: The heart size and mediastinal contours are within normal limits. Left lung is clear. Stable position of right-sided chest tube with small right pleural effusion. No pneumothorax is noted. Stable right basilar atelectasis or infiltrate is noted. The visualized skeletal structures are unremarkable. IMPRESSION: Stable position of right-sided chest tube with small right pleural effusion. Stable right basilar atelectasis or infiltrate is noted. No pneumothorax is noted. Electronically Signed   By: Marijo Conception M.D.   On: 10/13/2019 08:08        Scheduled Meds: . Chlorhexidine Gluconate Cloth  6 each Topical Daily  . enoxaparin (LOVENOX) injection  40 mg Subcutaneous Q24H  . feeding supplement (PRO-STAT SUGAR FREE 64)  30 mL Oral BID  . insulin aspart  0-9 Units Subcutaneous TID AC & HS   Continuous Infusions: . azithromycin 500 mg (10/13/19 2122)  . ceFEPime (MAXIPIME) IV 2 g (10/14/19 1158)     LOS: 4 days    Time spent: 38min    Domenic Polite, MD [facility] 10/14/2019, 1:56 PM

## 2019-10-14 NOTE — Progress Notes (Signed)
Chest tube flushed with 30cc normal saline as ordered without difficulty.

## 2019-10-15 ENCOUNTER — Encounter (HOSPITAL_COMMUNITY): Payer: Self-pay | Admitting: Radiology

## 2019-10-15 ENCOUNTER — Inpatient Hospital Stay (HOSPITAL_COMMUNITY): Payer: Medicare HMO

## 2019-10-15 DIAGNOSIS — J90 Pleural effusion, not elsewhere classified: Secondary | ICD-10-CM | POA: Diagnosis not present

## 2019-10-15 HISTORY — PX: IR THORACENTESIS ASP PLEURAL SPACE W/IMG GUIDE: IMG5380

## 2019-10-15 LAB — BASIC METABOLIC PANEL
Anion gap: 9 (ref 5–15)
BUN: 9 mg/dL (ref 8–23)
CO2: 25 mmol/L (ref 22–32)
Calcium: 7.8 mg/dL — ABNORMAL LOW (ref 8.9–10.3)
Chloride: 105 mmol/L (ref 98–111)
Creatinine, Ser: 0.72 mg/dL (ref 0.61–1.24)
GFR calc Af Amer: >60 mL/min (ref >60)
GFR calc non Af Amer: >60 mL/min (ref >60)
Glucose, Bld: 111 mg/dL — ABNORMAL HIGH (ref 70–99)
Potassium: 3.7 mmol/L (ref 3.5–5.1)
Sodium: 139 mmol/L (ref 135–145)

## 2019-10-15 LAB — CULTURE, BLOOD (ROUTINE X 2)
Culture: NO GROWTH
Culture: NO GROWTH
Special Requests: ADEQUATE
Special Requests: ADEQUATE

## 2019-10-15 LAB — CBC WITH DIFFERENTIAL/PLATELET
Abs Immature Granulocytes: 0.1 10*3/uL — ABNORMAL HIGH (ref 0.00–0.07)
Basophils Absolute: 0 10*3/uL (ref 0.0–0.1)
Basophils Relative: 0 %
Eosinophils Absolute: 0 10*3/uL (ref 0.0–0.5)
Eosinophils Relative: 0 %
HCT: 32.1 % — ABNORMAL LOW (ref 39.0–52.0)
Hemoglobin: 10.3 g/dL — ABNORMAL LOW (ref 13.0–17.0)
Immature Granulocytes: 1 %
Lymphocytes Relative: 19 %
Lymphs Abs: 2.2 10*3/uL (ref 0.7–4.0)
MCH: 27 pg (ref 26.0–34.0)
MCHC: 32.1 g/dL (ref 30.0–36.0)
MCV: 84.3 fL (ref 80.0–100.0)
Monocytes Absolute: 1.6 10*3/uL — ABNORMAL HIGH (ref 0.1–1.0)
Monocytes Relative: 13 %
Neutro Abs: 7.9 10*3/uL — ABNORMAL HIGH (ref 1.7–7.7)
Neutrophils Relative %: 67 %
Platelets: 390 10*3/uL (ref 150–400)
RBC: 3.81 MIL/uL — ABNORMAL LOW (ref 4.22–5.81)
RDW: 14.5 % (ref 11.5–15.5)
WBC: 11.9 10*3/uL — ABNORMAL HIGH (ref 4.0–10.5)
nRBC: 0 % (ref 0.0–0.2)

## 2019-10-15 LAB — GLUCOSE, CAPILLARY
Glucose-Capillary: 116 mg/dL — ABNORMAL HIGH (ref 70–99)
Glucose-Capillary: 122 mg/dL — ABNORMAL HIGH (ref 70–99)
Glucose-Capillary: 118 mg/dL — ABNORMAL HIGH (ref 70–99)
Glucose-Capillary: 154 mg/dL — ABNORMAL HIGH (ref 70–99)

## 2019-10-15 LAB — LACTATE DEHYDROGENASE, PLEURAL OR PERITONEAL FLUID: LD, Fluid: 964 U/L — ABNORMAL HIGH (ref 3–23)

## 2019-10-15 LAB — GLUCOSE, PLEURAL OR PERITONEAL FLUID: Glucose, Fluid: <20 mg/dL

## 2019-10-15 MED ORDER — AMOXICILLIN-POT CLAVULANATE 875-125 MG PO TABS
1.0000 | ORAL_TABLET | Freq: Two times a day (BID) | ORAL | Status: DC
  Administered 2019-10-15 – 2019-10-16 (×3): 1 via ORAL
  Filled 2019-10-15 (×3): qty 1

## 2019-10-15 MED ORDER — POLYETHYLENE GLYCOL 3350 17 G PO PACK
17.0000 g | PACK | Freq: Every day | ORAL | Status: DC | PRN
  Administered 2019-10-15: 10:00:00 17 g via ORAL
  Filled 2019-10-15: qty 1

## 2019-10-15 MED ORDER — LIDOCAINE HCL (PF) 1 % IJ SOLN
INTRAMUSCULAR | Status: DC | PRN
  Administered 2019-10-15: 15 mL

## 2019-10-15 MED ORDER — LIDOCAINE HCL 1 % IJ SOLN
INTRAMUSCULAR | Status: AC
  Filled 2019-10-15: qty 20

## 2019-10-15 NOTE — Procedures (Signed)
Ultrasound-guided diagnostic and therapeutic right thoracentesis performed yielding 40 cc of hazy, amber/blood-tinged fluid. No immediate complications. Follow-up chest x-ray pending. EBL none. Very small loculated right pleural effusion noted by [place] today. Despite cath manipulation only the above amount of fluid could be removed today. The fluid was submitted to the lab for preordered studies.

## 2019-10-15 NOTE — Progress Notes (Signed)
Notified by microbiology patient has final result - gram + cocci. Notified on call for triad Frank Carlson)

## 2019-10-15 NOTE — Progress Notes (Signed)
NAME:  Frank Carlson, MRN:  [id number], DOB:  02/04/1941, LOS: 5 ADMISSION DATE:  10/10/2019, CONSULTATION DATE:  12/1 REFERRING MD:  Domenic Polite, CHIEF COMPLAINT:  Loculated pleural effusion    Brief History    The patient is an 78 year old gentleman with past medical history of non-insulin-dependent diabetes and hyperlipidemia who presents after having a fall while walking in his neighborhood.  He was feeling short of breath while walking, and then fell forward.  He did sustain some abrasions to his nose and forehead with a small amount of bleeding.  He has a remote smoking history, quit over 60 years ago.  He denies having any pneumonia recently, but was having some nasal congestion and cough that was treated with doxycycline a few weeks ago.  He additionally reports having symptoms of cough earlier in the year around May or June which was attributed to mold.  He has not had any sick contacts, he has not had any Covid exposure.  He denies any fevers chills night sweats or weight loss.  His main issue is a dry cough and worsening dyspnea on exertion.   A chest x-ray demonstrated large right-sided loculated pleural effusion which was confirmed on CT chest.  He underwent ultrasound-guided thoracentesis in the IR with about 500 cc of serous fluid drained.  Pulmonary is being consulted to assist with management of the persistent loculated pleural effusion.  Past Medical History  Type 2 diabetes  Hyperlipidemia Color blindness BPH  Significant Hospital Events   11/28 Admitted   CT angio chest 11/28 > No PE. Large right pleural effusion with associated mass effect andcomplete compressive atelectasis of the right lower lobe and atelectasis of the majority of the right middle lobe. Pneumonia or underlying mass is not excluded.   11/29 > US guided right thoracentesis, placement of right chest tube with insilation of interpleural fibrinolytic  CT head and maxillofacial 11/28 > Comminuted  bilateral nasal arch fracture with mild depression. Anterior and inferior nasal septum fracture. The fracture is nondisplaced but the septum is deviated to the left. No evidence of intracranial or cervical spine injury. Large arachnoid cyst in the right middle cranial fossa.  Chest CT 12/1 > Marked interval decrease in size of the large right-sided pleural effusion, perhaps empyema, with a chest tube in place. A thin ring of fluid remains mix with locules of gas along with a small amount of fluid in the dependent right lower chest. Improved aeration of the right middle and lower lobe still with some lateral middle lobe consolidation and posterior lower lobe consolidation. Continued follow-up is suggested to ensure resolution. Along the exit site for the catheter there is some mild kinking of the catheter as it passes inferiorly along the right chest wall. Correlate with catheter function.   12/2 - Sitting up in bed with no acute complains, states he feels much better with 75% improvement compared to admission.   Consults:  PCCM  IR  Procedures:  11/29 > US guided right thoracentesis, placement of right chest tube with insilation of interpleural fibrinolytic   Significant Diagnostic Tests:  Results for [place], CAMISA" (MRN [id number]) as of 10/15/2019 09:36  Ref. Range 10/11/2019 11:03 10/11/2019 15:38  LD, Fluid Latest Ref Range: 3 - 23 U/L 712 (H) 617 (H)    CT angio chest 11/28 > No PE. Large right pleural effusion with associated mass effect andcomplete compressive atelectasis of the right lower lobe and atelectasis of the majority of the right  middle lobe. Pneumonia or underlying mass is not excluded.   CT head and maxillofacial 11/28 > Comminuted bilateral nasal arch fracture with mild depression. Anterior and inferior nasal septum fracture. The fracture is nondisplaced but the septum is deviated to the left. No evidence of intracranial or cervical spine injury. Large  arachnoid cyst in the right middle cranial fossa.  Chest CT 12/1 > Marked interval decrease in size of the large right-sided pleural effusion, perhaps empyema, with a chest tube in place. A thin ring of fluid remains mix with locules of gas along with a small amount of fluid in the dependent right lower chest. Improved aeration of the right middle and lower lobe still with some lateral middle lobe consolidation and posterior lower lobe consolidation. Continued follow-up is suggested to ensure resolution. Along the exit site for the catheter there is some mild kinking of the catheter as it passes inferiorly along the right chest wall. Correlate with catheter function.   Micro Data:  RVP 11/28 > Bordetella PCR 11/28 > Pleural culture 11/29>  AFB smear 11/29 > Urine culture 11/28 > neg Pleura grams stain > neg Blood culture 11/29 > neg Results for LEHMAN, HANSER" (MRN [id number]) as of 10/15/2019 09:36  Ref. Range 10/10/2019 18:08  Strep Pneumo Urinary Antigen Latest Ref Range: NEGATIVE  NEGATIVE  L. pneumophila Serogp 1 Ur Ag Latest Ref Range: Negative  Negative    Antimicrobials:  Cefepime 11/28 >   Anti-infectives (From admission, onward)   Start     Dose/Rate Route Frequency Ordered Stop   10/11/19 1830  vancomycin (VANCOCIN) 2,000 mg in sodium chloride 0.9 % 500 mL IVPB  Status:  Discontinued     2,000 mg 250 mL/hr over 120 Minutes Intravenous Every 24 hours 10/11/19 1100 10/11/19 1127   10/10/19 2200  azithromycin (ZITHROMAX) 500 mg in sodium chloride 0.9 % 250 mL IVPB     500 mg 250 mL/hr over 60 Minutes Intravenous Every 24 hours 10/10/19 2128     10/10/19 1830  vancomycin (VANCOCIN) 1,750 mg in sodium chloride 0.9 % 500 mL IVPB  Status:  Discontinued     1,750 mg 250 mL/hr over 120 Minutes Intravenous  Once 10/10/19 1816 10/10/19 1818   10/10/19 1830  vancomycin (VANCOCIN) 1,750 mg in sodium chloride 0.9 % 500 mL IVPB  Status:  Discontinued     1,750 mg 250  mL/hr over 120 Minutes Intravenous Every 24 hours 10/10/19 1818 10/11/19 1100   10/10/19 1830  ceFEPIme (MAXIPIME) 2 g in sodium chloride 0.9 % 100 mL IVPB     2 g 200 mL/hr over 30 Minutes Intravenous Every 8 hours 10/10/19 1820     10/10/19 1800  vancomycin (VANCOCIN) IVPB 1000 mg/200 mL premix  Status:  Discontinued     1,000 mg 200 mL/hr over 60 Minutes Intravenous  Once 10/10/19 1754 10/10/19 1757   10/10/19 1800  ceFEPIme (MAXIPIME) 2 g in sodium chloride 0.9 % 100 mL IVPB  Status:  Discontinued     2 g 200 mL/hr over 30 Minutes Intravenous  Once 10/10/19 1754 10/10/19 1820       Interim history/subjective:    10/15/2019 -= as of yesterday had drained only 100cc in 24h. INstruction yesterday for RN was to flush the catheter per shift. Afbrile. On abx. Patient says he is better and last 1-2 days hardly any drainage from chest tube. CXR today still with R sided small effuion  Objective   Blood pressure 111/63, pulse  72, temperature 98.1 F (36.7 C), temperature source Oral, resp. rate 20, height 6' 0.5" (1.842 m), weight 84.8 kg, SpO2 96 %.        Intake/Output Summary (Last 24 hours) at 10/15/2019 0934 Last data filed at 10/15/2019 0800 Gross per 24 hour  Intake 1360 ml  Output 2130 ml  Net -770 ml   Filed Weights   10/11/19 0421 10/11/19 0600 10/14/19 0300  Weight: 81.6 kg 88 kg 84.8 kg     General Appearance:  Looks well but deconditioned Head:  Normocephalic, without obvious abnormality, atraumatic Eyes:  PERRL - yes, conjunctiva/corneas - clear     Ears:  Normal external ear canals, both ears Nose:  G tube - no Throat:  ETT TUBE - no , OG tube - no Neck:  Supple,  No enlargement/tenderness/nodules Lungs: Clear to auscultation bilaterally, RT chest tube. RT base ? Equal air entry Heart:  S1 and S2 normal, no murmur, CVP - no.  Pressors - no Abdomen:  Soft, no masses, no organomegaly Genitalia / Rectal:  Not done Extremities:  Extremities- intact Skin:  ntact in  exposed areas . Sacral area - x Neurologic:  Sedation - none -> RASS - +1 . Moves all 4s - yes. CAM-ICU - neg . Orientation - x3+      Resolved Hospital Problem list     Assessment & Plan:  Complex pleural effusion  Loculated pleural effusion, likely empyema - good response to tpa / dnase (2 doses)  10/15/2019 -> improved with minimal drainage such that he meets criteria for dc of chest tube but still has small Rt effusion on CXR  P: Will get IR/US to do US guided thora of residual effusion - if they can and then dc chest tube -> then dc home next 1-2 days with 2-3 week antibiotics  Till then, Continue with chest tube, place to waterseal   - continue regular flushing Till then, Continue IV antibiotics  Repeat CT with marked decrease in size of the large right-sided pleural \  Rest per primary  Best practice:  Diet: Heart healthy Pain/Anxiety/Delirium protocol (if indicated): N/A VAP protocol (if indicated): N/A DVT prophylaxis: Lovenox GI prophylaxis: PPI Glucose control: monitor Mobility: up with assistance  Code Status: Full Family Communication: Per primary  Disposition: Progressive care   LABS    PULMONARY No results for input(s): PHART, PCO2ART, PO2ART, HCO3, TCO2, O2SAT in the last 168 hours.  Invalid input(s): PCO2, PO2  CBC Recent Labs  Lab 10/13/19 0259 10/14/19 0238 10/15/19 0313  HGB 10.0* 9.8* 10.3*  HCT 31.1* 29.8* 32.1*  WBC 14.1* 12.0* 11.9*  PLT 326 358 390    COAGULATION Recent Labs  Lab 10/10/19 1722  INR 1.1    CARDIAC  No results for input(s): TROPONINI in the last 168 hours. No results for input(s): PROBNP in the last 168 hours.   CHEMISTRY Recent Labs  Lab 10/11/19 0235 10/12/19 0256 10/13/19 0259 10/14/19 0238 10/15/19 0313  NA 140 139 141 137 139  K 3.7 4.0 3.6 3.6 3.7  CL 103 107 107 106 105  CO2 25 24 23 22 25   GLUCOSE 107* 206* 112* 138* 111*  BUN 12 15 16 13 9   CREATININE 0.81 1.01 0.88 0.72 0.72  CALCIUM  8.6* 8.3* 8.2* 7.8* 7.8*   Estimated Creatinine Clearance: 82.1 mL/min (by C-G formula based on SCr of 0.72 mg/dL).   LIVER Recent Labs  Lab 10/10/19 1722 10/11/19 0235 10/14/19 0238  AST 120* 87* 112*  ALT 145* 123* 147*  ALKPHOS 119 114 80  BILITOT 1.6* 1.0 0.2*  PROT 7.0 6.3*  6.5 5.4*  ALBUMIN 2.2* 2.0* 1.6*  INR 1.1  --   --      INFECTIOUS Recent Labs  Lab 10/10/19 1722 10/10/19 2116  LATICACIDVEN 1.9 1.0  PROCALCITON 0.27  --      ENDOCRINE CBG (last 3)  Recent Labs    10/14/19 1625 10/14/19 2143 10/15/19 0621  GLUCAP 107* 186* 116*         IMAGING x48h  - image(s) personally visualized  -   highlighted in bold Dg Chest 2 View  Result Date: 10/15/2019 CLINICAL DATA:  Follow-up pleural effusion. EXAM: CHEST - 2 VIEW COMPARISON:  10/13/2019 FINDINGS: Right-sided chest tube is in place. No significant pneumothorax identified. Right pleural effusion and right basilar atelectasis/infiltrate is unchanged. Left lung clear. IMPRESSION: 1. Stable position of right chest tube with small right pleural effusion. No pneumothorax identified Electronically Signed   By: Kerby Moors M.D.   On: 10/15/2019 09:19   Ct Chest Wo Contrast  Result Date: 10/13/2019 CLINICAL DATA:  Post recent chest tube placement, evaluate pleural effusion. EXAM: CT CHEST WITHOUT CONTRAST TECHNIQUE: Multidetector CT imaging of the chest was performed following the standard protocol without IV contrast. COMPARISON:  10/10/2019 CT angiography of the chest and chest x-ray of 10/13/2019 FINDINGS: Cardiovascular: Scattered atherosclerosis. No signs of dilation of the thoracic aorta. Heart size is stable without pericardial effusion. Central pulmonary vasculature is of normal caliber. Mediastinum/Nodes: Scattered small lymph nodes in the chest are unchanged. Thoracic inlet structures are normal. Lungs/Pleura: Marked interval decrease in size of a large right-sided pleural effusion that was seen on  the previous CT chest. Right-sided chest tube along the periphery of the right chest. No clinically significant pneumothorax with scattered locules of gas within residual pleural fluid contained within the rind of fluid along the periphery of the right chest. There is a small area of dependent fluid measuring approximately 7.8 x 3.4 cm (image 107, series 4) there is improved aeration of right middle and lower lobe still with some lateral middle lobe consolidation and posterior lower lobe consolidation. Airways are patent. Calcified granuloma in the left chest in the lingula and another in the left upper lobe. Upper Abdomen: Cholelithiasis. No signs of acute process in the upper abdomen. Presumed cyst arising from anterior right kidney is incompletely imaged but was seen on the prior CT examination from 2017. Musculoskeletal: No signs of acute bone finding or destructive bone process. Signs of spinal degenerative change. Along the exit site for the catheter there is some mild kinking of the catheter as it passes inferiorly along the right chest wall. IMPRESSION: 1. Marked interval decrease in size of the large right-sided pleural effusion, perhaps empyema, with a chest tube in place. A thin rind of fluid remains mix with locules of gas along with a small amount of fluid in the dependent right lower chest. 2. Improved aeration of the right middle and lower lobe still with some lateral middle lobe consolidation and posterior lower lobe consolidation. Continued follow-up is suggested to ensure resolution. 3. Along the exit site for the catheter there is some mild kinking of the catheter as it passes inferiorly along the right chest wall. Correlate with catheter function. 4. Cholelithiasis. 5. Aortic atherosclerosis. 6. These results will be called to the ordering clinician or representative by the Radiologist Assistant, and communication documented in the PACS or zVision Dashboard. Aortic Atherosclerosis (ICD10-I70.0).  Electronically Signed   By: Zetta Bills M.D.   On: 10/13/2019 19:30

## 2019-10-15 NOTE — Care Management Important Message (Signed)
Important Message  Patient Details  Name: Frank Carlson MRN: [id number] Date of Birth: 1941-06-08   Medicare Important Message Given:  Yes     Shelda Altes 10/15/2019, 1:32 PM

## 2019-10-15 NOTE — Progress Notes (Signed)
Chest tube flushed with 30cc of normal saline as ordered.

## 2019-10-15 NOTE — Progress Notes (Signed)
PROGRESS NOTE    Frank Carlson  [id number] DOB: 1941/02/08 DOA: 10/10/2019 PCP: Venia Carbon, MD  Brief Narrative:  88 [address] [facility] type 2 diabetes mellitus, BPH, dyslipidemia presented to the ED following a near syncopal episode. dyspnea on exertion for the last 1 month + nonproductive cough, RX doxycycline about 3 weeks ago with some temporary improvement -11/28 he was walking uphill when he became acutely dyspneic and fell forward, denied any loss of consciousness. -In the emergency room he was febrile to 102, white count of 17,000, noted to have nasal arch fracture, CT chest noted large R effusion -Underwent ultrasound-guided thoracentesis in radiology, 500 cc of clear fluid drained, IR PA in the note documented concern for loculations -PCCM consulted 11/29, underwent chest tube placement 11/29 and TPA/DNase x 2   Assessment & Plan:   Loculated right pleural effusion -Empyema versus complex parapneumonic effusion, febrile to 102, WBC 17K on admission -Clinically improving, continue IV cefepime and azithromycin --will need ongoing antibiotics for several weeks-transitioning to Augmentin 12/3-White count has come down from the 14 range over the past several days to the 11 range with treatment -Status post chest tube placement 11/29, treated with DNase/TPA x2 , output -3.2 L -Pleural fluid Gram stain and cultures NGTD since 11/29 cytology with predominant neutrophilic inflammation, reactive mesothelial cells, blood culture negative- -Serial chest x-rays note improvement-CT chest 12/1 shows interval decrease in size R effusion-tube was not being drained by nursing-pulmonology wishes to get IR to tap remaining effusion and then maybe can plan on discontinuation of tube a.m.  Nasal bridge fracture -ENT consult appreciated -Recommended to follow-up with Dr.Teoh will likely not need any surgical intervention  Delirium/agitation 11/30 am -Patient was agitated and  attempting to leave however this is resolved mentation is at baseline  Mildly elevated LFTs -Hold statin, bili is normal  -Trend and monitor-potentially related to mild steatosis of liver  Iron deficiency anemia -Given IV iron x1 -Outpatient gastroenterology follow-up for colonoscopy  Type 2 diabetes mellitus -Metformin on hold, CBGs 116-1 86 hemoglobin A1c 7.1  BPH  Renal cysts noted on imaging  DVT prophylaxis: Lovenox Code Status: Full code Family Communication: N discussed with wife at bedside Disposition Plan: Home pending resolution of empyema  Consultants:   PCCM   Procedures: Ultrasound thoracentesis 11/29 500 mL drained  Chest tube placement Dr. Shearon Stalls 11/29  Antimicrobials:  IV cefepime and azithromycin started 11/28 transitioned to oral Augmentin 12/3-->end date 12/19 (3 weeks)  Subjective:  Looks and feels fair was up out of bed did a bird bath yesterday No chest pain no fever Chest tube has been flushed a couple of times He is feeling well  Objective: Vitals:   10/14/19 2302 10/14/19 2310 10/15/19 0353 10/15/19 0805  BP: 115/64 115/64 (!) 113/59 111/63  Pulse: 76 65 74 72  Resp: 13 17 19 20   Temp: 98.2 F (36.8 C) 97.9 F (36.6 C) 99.4 F (37.4 C) 98.1 F (36.7 C)  TempSrc: Oral Oral Oral Oral  SpO2: 98% 97% 95% 96%  Weight:      Height:        Intake/Output Summary (Last 24 hours) at 10/15/2019 1021 Last data filed at 10/15/2019 0900 Gross per 24 hour  Intake 1360 ml  Output 2830 ml  Net -1470 ml   Filed Weights   10/11/19 0421 10/11/19 0600 10/14/19 0300  Weight: 81.6 kg 88 kg 84.8 kg    Examination:  EOMI NCAT no focal deficit and bruises resolved Chest  clinically clear bilaterally pleural tube.  Right hemithorax S1-S2 no murmur rub or gallop Abdomen soft nontender no rebound no Neurologically intact no focal deficit No lower extremity edema   Data Reviewed:   CBC: Recent Labs  Lab 10/10/19 1722 10/11/19 0235  10/12/19 0017 10/13/19 0259 10/14/19 0238 10/15/19 0313  WBC 17.5* 14.8*  --  14.1* 12.0* 11.9*  NEUTROABS 13.8*  --   --   --   --  7.9*  HGB 10.9* 10.5* 11.4* 10.0* 9.8* 10.3*  HCT 33.8* 32.3*  31.5* 34.0* 31.1* 29.8* 32.1*  MCV 84.3 85.0  --  85.0 83.9 84.3  PLT 305 317  --  326 358 [id number]   Basic Metabolic Panel: Recent Labs  Lab 10/11/19 0235 10/12/19 0256 10/13/19 0259 10/14/19 0238 10/15/19 0313  NA 140 139 141 137 139  K 3.7 4.0 3.6 3.6 3.7  CL 103 107 107 106 105  CO2 25 24 23 22 25   GLUCOSE 107* 206* 112* 138* 111*  BUN 12 15 16 13 9   CREATININE 0.81 1.01 0.88 0.72 0.72  CALCIUM 8.6* 8.3* 8.2* 7.8* 7.8*   GFR: Estimated Creatinine Clearance: 82.1 mL/min (by C-G formula based on SCr of 0.72 mg/dL). Liver Function Tests: Recent Labs  Lab 10/10/19 1722 10/11/19 0235 10/14/19 0238  AST 120* 87* 112*  ALT 145* 123* 147*  ALKPHOS 119 114 80  BILITOT 1.6* 1.0 0.2*  PROT 7.0 6.3*  6.5 5.4*  ALBUMIN 2.2* 2.0* 1.6*   No results for input(s): LIPASE, AMYLASE in the last 168 hours. No results for input(s): AMMONIA in the last 168 hours. Coagulation Profile: Recent Labs  Lab 10/10/19 1722  INR 1.1   Cardiac Enzymes: Recent Labs  Lab 10/11/19 0235  CKTOTAL 71  CKMB 3.2   BNP (last 3 results) No results for input(s): PROBNP in the last 8760 hours. HbA1C: No results for input(s): HGBA1C in the last 72 hours. CBG: Recent Labs  Lab 10/14/19 0758 10/14/19 1117 10/14/19 1625 10/14/19 2143 10/15/19 0621  GLUCAP 153* 125* 107* 186* 116*   Lipid Profile: No results for input(s): CHOL, HDL, LDLCALC, TRIG, CHOLHDL, LDLDIRECT in the last 72 hours. Thyroid Function Tests: No results for input(s): TSH, T4TOTAL, FREET4, T3FREE, THYROIDAB in the last 72 hours. Anemia Panel: No results for input(s): VITAMINB12, FOLATE, FERRITIN, TIBC, IRON, RETICCTPCT in the last 72 hours. Urine analysis:    Component Value Date/Time   COLORURINE AMBER (A) 10/10/2019 1808    APPEARANCEUR HAZY (A) 10/10/2019 1808   LABSPEC 1.023 10/10/2019 1808   PHURINE 5.0 10/10/2019 1808   GLUCOSEU NEGATIVE 10/10/2019 1808   HGBUR NEGATIVE 10/10/2019 1808   HGBUR moderate 11/03/2009 0804   BILIRUBINUR NEGATIVE 10/10/2019 1808   BILIRUBINUR 2+ 11/16/2015 1227   KETONESUR NEGATIVE 10/10/2019 1808   PROTEINUR 30 (A) 10/10/2019 1808   UROBILINOGEN >=8.0 11/16/2015 1227   UROBILINOGEN 0.2 11/03/2009 0804   NITRITE NEGATIVE 10/10/2019 1808   LEUKOCYTESUR NEGATIVE 10/10/2019 1808   Sepsis Labs: @LABRCNTIP (procalcitonin:4,lacticidven:4)  ) Recent Results (from the past 240 hour(s))  Culture, blood (routine x 2)     Status: None   Collection Time: 10/10/19  5:15 PM   Specimen: BLOOD RIGHT ARM  Result Value Ref Range Status   Specimen Description BLOOD RIGHT ARM  Final   Special Requests   Final    BOTTLES DRAWN AEROBIC AND ANAEROBIC Blood Culture adequate volume   Culture   Final    NO GROWTH 5 DAYS Performed at [facility]  [facility], [address], [place], [place] [address]    Report Status 10/15/2019 FINAL  Final  Culture, blood (routine x 2)     Status: None   Collection Time: 10/10/19  5:22 PM   Specimen: BLOOD  Result Value Ref Range Status   Specimen Description BLOOD RIGHT ANTECUBITAL  Final   Special Requests   Final    BOTTLES DRAWN AEROBIC AND ANAEROBIC Blood Culture adequate volume   Culture   Final    NO GROWTH 5 DAYS Performed at [facility], [place] [address], [place], [place] [address]    Report Status 10/15/2019 FINAL  Final  Urine culture     Status: None   Collection Time: 10/10/19  6:08 PM   Specimen: Urine, Clean Catch  Result Value Ref Range Status   Specimen Description URINE, CLEAN CATCH  Final   Special Requests NONE  Final   Culture   Final    NO GROWTH Performed at [facility], [place] [address], [place], [place] [address]    Report Status 10/11/2019 FINAL  Final  SARS CORONAVIRUS 2 (TAT 6-24 HRS) Nasopharyngeal  Nasopharyngeal Swab     Status: None   Collection Time: 10/10/19  9:56 PM   Specimen: Nasopharyngeal Swab  Result Value Ref Range Status   SARS Coronavirus 2 NEGATIVE NEGATIVE Final    Comment: (NOTE) SARS-CoV-2 target nucleic acids are NOT DETECTED. The SARS-CoV-2 RNA is generally detectable in upper and lower respiratory specimens during the acute phase of infection. Negative results do not preclude SARS-CoV-2 infection, do not rule out co-infections with other pathogens, and should not be used as the sole basis for treatment or other patient management decisions. Negative results must be combined with clinical observations, patient history, and epidemiological information. The expected result is Negative. Fact Sheet for Patients: SugarRoll.be Fact Sheet for Healthcare Providers: https://www.woods-mathews.com/ This test is not yet approved or cleared by the [place] FDA and  has been authorized for detection and/or diagnosis of SARS-CoV-2 by FDA under an Emergency Use Authorization (EUA). This EUA will remain  in effect (meaning this test can be used) for the duration of the COVID-19 declaration under Section 56 4(b)(1) of the Act, 21 U.S.C. section 360bbb-3(b)(1), unless the authorization is terminated or revoked sooner. Performed at [facility], [place] [address], [place], [place] [address]   Gram stain     Status: None   Collection Time: 10/11/19 11:03 AM   Specimen: PATH Cytology Pleural fluid  Result Value Ref Range Status   Specimen Description PLEURAL RIGHT  Final   Special Requests NONE  Final   Gram Stain   Final    FEW WBC PRESENT, PREDOMINANTLY PMN NO ORGANISMS SEEN Performed at [facility], [place] [address] [place], [place] [address]    Report Status 10/11/2019 FINAL  Final  Acid Fast Smear (AFB)     Status: None   Collection Time: 10/11/19 11:03 AM   Specimen: PATH Cytology Pleural fluid  Result Value  Ref Range Status   AFB Specimen Processing Concentration  Final   Acid Fast Smear Negative  Final    Comment: (NOTE) Performed At: [facility] [place], [place] [id number] Rush Farmer MD [id number]    Source (AFB) PLEURAL  Final    Comment: RIGHT Performed at [facility], [place] [address], [place],  [address]   Culture, body fluid-bottle     Status: None (Preliminary result)  Collection Time: 10/11/19 11:03 AM   Specimen: Pleura  Result Value Ref Range Status   Specimen Description PLEURAL RIGHT  Final   Special Requests NONE  Final   Culture   Final    NO GROWTH 4 DAYS Performed at [facility], [address], [place], [place] [address]    Report Status PENDING  Incomplete  Culture, body fluid-bottle     Status: None (Preliminary result)   Collection Time: 10/11/19  3:27 PM   Specimen: Pleura  Result Value Ref Range Status   Specimen Description PLEURAL  Final   Special Requests NONE  Final   Culture   Final    NO GROWTH 4 DAYS Performed at [facility], [address], [place], [place] [address]    Report Status PENDING  Incomplete  Gram stain     Status: None   Collection Time: 10/11/19  3:27 PM   Specimen: Pleura  Result Value Ref Range Status   Specimen Description PLEURAL  Final   Special Requests NONE  Final   Gram Stain   Final    RARE WBC PRESENT, PREDOMINANTLY PMN NO ORGANISMS SEEN Performed at [facility], [place] [address] [place], [place] [address]    Report Status 10/11/2019 FINAL  Final         Radiology Studies: Dg Chest 2 View  Result Date: 10/15/2019 CLINICAL DATA:  Follow-up pleural effusion. EXAM: CHEST - 2 VIEW COMPARISON:  10/13/2019 FINDINGS: Right-sided chest tube is in place. No significant pneumothorax identified. Right pleural effusion and right basilar atelectasis/infiltrate is unchanged. Left lung clear. IMPRESSION: 1. Stable position of right chest tube with small right  pleural effusion. No pneumothorax identified Electronically Signed   By: Kerby Moors M.D.   On: 10/15/2019 09:19   Ct Chest Wo Contrast  Result Date: 10/13/2019 CLINICAL DATA:  Post recent chest tube placement, evaluate pleural effusion. EXAM: CT CHEST WITHOUT CONTRAST TECHNIQUE: Multidetector CT imaging of the chest was performed following the standard protocol without IV contrast. COMPARISON:  10/10/2019 CT angiography of the chest and chest x-ray of 10/13/2019 FINDINGS: Cardiovascular: Scattered atherosclerosis. No signs of dilation of the thoracic aorta. Heart size is stable without pericardial effusion. Central pulmonary vasculature is of normal caliber. Mediastinum/Nodes: Scattered small lymph nodes in the chest are unchanged. Thoracic inlet structures are normal. Lungs/Pleura: Marked interval decrease in size of a large right-sided pleural effusion that was seen on the previous CT chest. Right-sided chest tube along the periphery of the right chest. No clinically significant pneumothorax with scattered locules of gas within residual pleural fluid contained within the rind of fluid along the periphery of the right chest. There is a small area of dependent fluid measuring approximately 7.8 x 3.4 cm (image 107, series 4) there is improved aeration of right middle and lower lobe still with some lateral middle lobe consolidation and posterior lower lobe consolidation. Airways are patent. Calcified granuloma in the left chest in the lingula and another in the left upper lobe. Upper Abdomen: Cholelithiasis. No signs of acute process in the upper abdomen. Presumed cyst arising from anterior right kidney is incompletely imaged but was seen on the prior CT examination from 2017. Musculoskeletal: No signs of acute bone finding or destructive bone process. Signs of spinal degenerative change. Along the exit site for the catheter there is some mild kinking of the catheter as it passes inferiorly along the right  chest wall. IMPRESSION: 1. Marked interval decrease in size  of the large right-sided pleural effusion, perhaps empyema, with a chest tube in place. A thin rind of fluid remains mix with locules of gas along with a small amount of fluid in the dependent right lower chest. 2. Improved aeration of the right middle and lower lobe still with some lateral middle lobe consolidation and posterior lower lobe consolidation. Continued follow-up is suggested to ensure resolution. 3. Along the exit site for the catheter there is some mild kinking of the catheter as it passes inferiorly along the right chest wall. Correlate with catheter function. 4. Cholelithiasis. 5. Aortic atherosclerosis. 6. These results will be called to the ordering clinician or representative by the Radiologist Assistant, and communication documented in the PACS or zVision Dashboard. Aortic Atherosclerosis (ICD10-I70.0). Electronically Signed   By: Zetta Bills M.D.   On: 10/13/2019 19:30        Scheduled Meds: . Chlorhexidine Gluconate Cloth  6 each Topical Daily  . enoxaparin (LOVENOX) injection  40 mg Subcutaneous Q24H  . feeding supplement (PRO-STAT SUGAR FREE 64)  30 mL Oral BID  . insulin aspart  0-9 Units Subcutaneous TID AC & HS   Continuous Infusions: . azithromycin 500 mg (10/14/19 2140)  . ceFEPime (MAXIPIME) IV 2 g (10/15/19 0355)     LOS: 5 days    Time spent: 52min    Domenic Polite, MD [facility] 10/15/2019, 10:21 AM

## 2019-10-15 NOTE — Progress Notes (Signed)
Chest tube removed. Vaseline gauze applied. Pt tolerated well. Pt has no complaints. Will continue to monitor. Jerald Kief, RN.

## 2019-10-16 ENCOUNTER — Telehealth: Payer: Self-pay

## 2019-10-16 DIAGNOSIS — J90 Pleural effusion, not elsewhere classified: Secondary | ICD-10-CM | POA: Diagnosis not present

## 2019-10-16 LAB — PHOSPHORUS: Phosphorus: 2.9 mg/dL (ref 2.5–4.6)

## 2019-10-16 LAB — CULTURE, BODY FLUID W GRAM STAIN -BOTTLE: Culture: NO GROWTH

## 2019-10-16 LAB — MAGNESIUM: Magnesium: 2 mg/dL (ref 1.7–2.4)

## 2019-10-16 LAB — CYTOLOGY - NON PAP

## 2019-10-16 LAB — GLUCOSE, CAPILLARY: Glucose-Capillary: 130 mg/dL — ABNORMAL HIGH (ref 70–99)

## 2019-10-16 MED ORDER — POLYETHYLENE GLYCOL 3350 17 G PO PACK: 17.0000 g | PACK | Freq: Every day | ORAL | 0 refills | Status: DC | PRN

## 2019-10-16 MED ORDER — AMOXICILLIN-POT CLAVULANATE 875-125 MG PO TABS: 1.0000 | ORAL_TABLET | Freq: Two times a day (BID) | ORAL | 0 refills | Status: AC

## 2019-10-16 NOTE — Plan of Care (Signed)

## 2019-10-16 NOTE — Discharge Summary (Signed)
physician Discharge Summary  Frank Carlson [id number] DOB: 1941/04/13 DOA: 10/10/2019  PCP: Venia Carbon, MD  Admit date: 10/10/2019 Discharge date: 10/16/2019  Time spent: 30 minutes  Recommendations for Outpatient Follow-up:  1. Complete Augmentin 12/nineteen 3-week course and will need outpatient chest x-ray probably by the end of the month in addition to screening Chem-12, CBC 2. Started MiraLAX 3. Follow-up final sensitivities of chest fluid culture 4. Consider outpatient colonoscopy, iron studies   Discharge Diagnoses:  Active Problems:   Diabetes mellitus with neurological manifestations, controlled ([place])   Hyperlipidemia   CAP (community acquired pneumonia)   Loculated pleural effusion   Abnormal liver function   Anemia   Protein-calorie malnutrition, severe ([place])   Discharge Condition: Improved  Diet recommendation: Heart healthy  Filed Weights   10/11/19 0421 10/11/19 0600 10/14/19 0300  Weight: 81.6 kg 88 kg 84.8 kg    History of present illness:  78 community dwelling WM type 2 diabetes mellitus, BPH, dyslipidemia presented to the ED following a near syncopal episode. dyspnea on exertion for the last 1 month + nonproductive cough, RX doxycycline about 3 weeks ago with some temporary improvement -11/28 he was walking uphill when he became acutely dyspneic and fell forward, denied any loss of consciousness. -In the emergency room he was febrile to 102, white count of 17,000, noted to have nasal arch fracture, CT chest noted large R effusion -Underwent ultrasound-guided thoracentesis in radiology, 500 cc of clear fluid drained, IR PA in the note documented concern for loculations -PCCM consulted 11/29, underwent chest tube placement 11/29 and TPA/DNase x 2  Hospital Course:  Loculated right pleural effusion -Empyema versus complex parapneumonic effusion, febrile to 102, WBC 17K on admission -Given IV cefepime and azithromycin --on discharge started  Augmentin 12/3-to complete 12/19 -Status post chest tube placement 11/29, treated with DNase/TPA x2 , output -3.2 L -Pleural fluid Gram stain and cultures  grew gram-positive cocci final sensitivities pending since 11/29 cytology with predominant neutrophilic inflammation, reactive mesothelial cells, blood culture negative- -Serial chest x-rays note improvement-CT chest 12/1 shows interval decrease in size R effusion-chest tube removed after second thoracentesis of 40 cc 12/3 -Needs outpatient x-ray end of month  Nasal bridge fracture -ENT consult appreciated -Recommended to follow-up with Dr.Teoh will likely not need any surgical intervention  Delirium/agitation 11/30 am -Patient was agitated and attempting to leave however this is resolved mentation is at baseline  Mildly elevated LFTs -Hold statin, bili is normal  -Trend and monitor-potentially related to mild steatosis of liver  Iron deficiency anemia -Given IV iron x1 -Outpatient gastroenterology follow-up for colonoscopy  Type 2 diabetes mellitus -Metformin on hold, well controlled this admission hemoglobin A1c 7.1  BPH  Renal cysts noted on imaging  Procedures:  chest tube placement 11/29-4 removed 12/3 thora 11/29 and 12/3  Consultations:  Pulmonology  Interventional radiology  Discharge Exam: Vitals:   10/16/19 0625 10/16/19 0729  BP: (!) 146/78 123/66  Pulse: 69 74  Resp: (!) 21 20  Temp: 98.2 F (36.8 C) 98.1 F (36.7 C)  SpO2: 97% 96%    General: Ambulatory eating drinking no focal deficit Cardiovascular: S1-S2 no murmur rub or gallop Respiratory: Slightly more dull note right posterior hemithorax with less air entry increased present Abdomen soft no rebound no guarding no focal deficit   Discharge Instructions   Discharge Instructions    Diet - low sodium heart healthy   Complete by: As directed    Discharge instructions   Complete by:  As directed    Complete Augmentin 12/19 and get  a chest x-ray in about 2 weeks after that with your primary care physician's office we have given you a prescription for MiraLAX in addition Get some labs once you follow-up for x-ray to ensure labs are stable   Increase activity slowly   Complete by: As directed      Allergies as of 10/16/2019      Reactions   Bee Venom Other (See Comments)   Passed out      Medication List    TAKE these medications   acetaminophen 325 MG tablet Commonly known as: TYLENOL Take 325 mg by mouth every 6 (six) hours as needed for headache (pain).   Alcohol Swabs Pads 1 Package by Does not apply route as needed.   amoxicillin-clavulanate 875-125 MG tablet Commonly known as: AUGMENTIN Take 1 tablet by mouth every 12 (twelve) hours for 15 days.   CHEWABLE CALCIUM/D PO Take 1 tablet by mouth at bedtime.   FISH OIL PO Take 1 capsule by mouth every morning.   glucose blood test strip Commonly known as: True Metrix Blood Glucose Test Use to test blood sugar once a day. Dx Code: E08.40 What changed: additional instructions   metFORMIN 500 MG tablet Commonly known as: GLUCOPHAGE Take 1 tablet (500 mg total) by mouth 2 (two) times daily with a meal.   multivitamin with minerals Tabs tablet Take 1 tablet by mouth every morning.   polyethylene glycol 17 g packet Commonly known as: MIRALAX / GLYCOLAX Take 17 g by mouth daily as needed for moderate constipation.   simvastatin 20 MG tablet Commonly known as: ZOCOR TAKE 1 TABLET (20 MG TOTAL) BY MOUTH AT BEDTIME.   TRUEplus Lancets 30G Misc 1 Units by Does not apply route daily. Use to check blood sugar once a day. Dx Code [id number] What changed:   how much to take  when to take this  additional instructions      Allergies  Allergen Reactions  . Bee Venom Other (See Comments)    Passed out      The results of significant diagnostics from this hospitalization (including imaging, microbiology, ancillary and laboratory) are listed below  for reference.    Significant Diagnostic Studies: Dg Chest 1 View  Result Date: 10/15/2019 CLINICAL DATA:  Post right-sided thoracentesis. EXAM: CHEST  1 VIEW COMPARISON:  Chest radiograph-earlier same day; 10/13/2019; chest CT-10/13/2019 FINDINGS: Grossly unchanged cardiac silhouette and mediastinal contours. Interval reduction in persistent small potentially partially loculated right-sided effusion post thoracentesis. No pneumothorax. Improved aeration the right lung base with persistent right perihilar and basilar heterogeneous opacities. The left hemithorax remains well aerated. No new focal airspace opacities. Calcified granuloma are again seen within the left mid lung. No acute osseous abnormalities. IMPRESSION: 1. Interval reduction in persistent small potentially partially loculated right-sided effusion post thoracentesis. No pneumothorax. 2. Improved aeration the right lung base with persistent right basilar opacities, atelectasis versus infiltrate. 3.  Stable positioning of support apparatus. 4. Stable sequela of previous granulomatous infection. Electronically Signed   By: Sandi Mariscal M.D.   On: 10/15/2019 12:31   Dg Chest 1 View  Result Date: 10/11/2019 CLINICAL DATA:  RIGHT-sided pleural effusion status post thoracentesis. EXAM: CHEST  1 VIEW COMPARISON:  Chest x-ray dated 10/10/2019. RIGHT chest ultrasound from earlier today. FINDINGS: The large opacity within the RIGHT hemithorax is not appreciably changed in size compared to the earlier chest x-ray. LEFT lung remains clear. No pneumothorax seen. Heart  size and mediastinal contours are stable. IMPRESSION: No significant change of the RIGHT lung opacity despite interval thoracentesis. No pneumothorax seen status post thoracentesis. Electronically Signed   By: Franki Cabot M.D.   On: 10/11/2019 11:15   Dg Chest 2 View  Result Date: 10/15/2019 CLINICAL DATA:  Follow-up pleural effusion. EXAM: CHEST - 2 VIEW COMPARISON:  10/13/2019  FINDINGS: Right-sided chest tube is in place. No significant pneumothorax identified. Right pleural effusion and right basilar atelectasis/infiltrate is unchanged. Left lung clear. IMPRESSION: 1. Stable position of right chest tube with small right pleural effusion. No pneumothorax identified Electronically Signed   By: Kerby Moors M.D.   On: 10/15/2019 09:19   Ct Head Wo Contrast  Result Date: 10/10/2019 CLINICAL DATA:  Fall with facial injury. EXAM: CT HEAD WITHOUT CONTRAST CT MAXILLOFACIAL WITHOUT CONTRAST CT CERVICAL SPINE WITHOUT CONTRAST TECHNIQUE: Multidetector CT imaging of the head, cervical spine, and maxillofacial structures were performed using the standard protocol without intravenous contrast. Multiplanar CT image reconstructions of the cervical spine and maxillofacial structures were also generated. COMPARISON:  None. FINDINGS: CT HEAD FINDINGS Brain: No evidence of hemorrhage or swelling. No acute infarct, mass, or hydrocephalus. CSF density mass in the right middle cranial fossa with local mass effect, up to 4.6 cm in transverse span and 5.5 cm craniocaudal-consistent with arachnoid cyst. Generalized brain atrophy. There is dural-based calcification along the right cerebral convexity, question sequela of remote subdural hemorrhage Vascular: Atherosclerotic calcification. Skull: No calvarial fracture. CT MAXILLOFACIAL FINDINGS Osseous: Bilateral nasal arch fracture with comminution and mild depression on the right. Gas is seen in the anterior nasal septum where there is subtle fracture. No orbital or ethmoid continuation is seen. The mandible is intact and located. Orbits: Bilateral cataract resection. No evidence of postseptal injury. Sinuses: Expansive frontal sinuses. High-density frothy material in the right frontal sinus and right nasal cavity/nasopharynx is presumably epistaxis. In the inferior right frontal sinus is likely epistaxis Soft tissues: Soft tissue swelling about the nose. No  opaque foreign body. CT CERVICAL SPINE FINDINGS Alignment: No traumatic malalignment. Skull base and vertebrae: Negative for acute fracture Soft tissues and spinal canal: No prevertebral fluid or swelling. No visible canal hematoma. Disc levels: Lower cervical degenerative disc narrowing and ridging. Generalized mild cervical facet spurring. Upper chest: Right-sided pleural effusion, reference dedicated chest CT IMPRESSION: 1. Comminuted bilateral nasal arch fracture with mild depression. 2. Anterior and inferior nasal septum fracture. The fracture is nondisplaced but the septum is deviated to the left. 3. No evidence of intracranial or cervical spine injury. 4. Large arachnoid cyst in the right middle cranial fossa. Electronically Signed   By: Monte Fantasia M.D.   On: 10/10/2019 19:06   Ct Chest Wo Contrast  Result Date: 10/13/2019 CLINICAL DATA:  Post recent chest tube placement, evaluate pleural effusion. EXAM: CT CHEST WITHOUT CONTRAST TECHNIQUE: Multidetector CT imaging of the chest was performed following the standard protocol without IV contrast. COMPARISON:  10/10/2019 CT angiography of the chest and chest x-ray of 10/13/2019 FINDINGS: Cardiovascular: Scattered atherosclerosis. No signs of dilation of the thoracic aorta. Heart size is stable without pericardial effusion. Central pulmonary vasculature is of normal caliber. Mediastinum/Nodes: Scattered small lymph nodes in the chest are unchanged. Thoracic inlet structures are normal. Lungs/Pleura: Marked interval decrease in size of a large right-sided pleural effusion that was seen on the previous CT chest. Right-sided chest tube along the periphery of the right chest. No clinically significant pneumothorax with scattered locules of gas within residual pleural fluid  contained within the rind of fluid along the periphery of the right chest. There is a small area of dependent fluid measuring approximately 7.8 x 3.4 cm (image 107, series 4) there is  improved aeration of right middle and lower lobe still with some lateral middle lobe consolidation and posterior lower lobe consolidation. Airways are patent. Calcified granuloma in the left chest in the lingula and another in the left upper lobe. Upper Abdomen: Cholelithiasis. No signs of acute process in the upper abdomen. Presumed cyst arising from anterior right kidney is incompletely imaged but was seen on the prior CT examination from 2017. Musculoskeletal: No signs of acute bone finding or destructive bone process. Signs of spinal degenerative change. Along the exit site for the catheter there is some mild kinking of the catheter as it passes inferiorly along the right chest wall. IMPRESSION: 1. Marked interval decrease in size of the large right-sided pleural effusion, perhaps empyema, with a chest tube in place. A thin rind of fluid remains mix with locules of gas along with a small amount of fluid in the dependent right lower chest. 2. Improved aeration of the right middle and lower lobe still with some lateral middle lobe consolidation and posterior lower lobe consolidation. Continued follow-up is suggested to ensure resolution. 3. Along the exit site for the catheter there is some mild kinking of the catheter as it passes inferiorly along the right chest wall. Correlate with catheter function. 4. Cholelithiasis. 5. Aortic atherosclerosis. 6. These results will be called to the ordering clinician or representative by the Radiologist Assistant, and communication documented in the PACS or zVision Dashboard. Aortic Atherosclerosis (ICD10-I70.0). Electronically Signed   By: Zetta Bills M.D.   On: 10/13/2019 19:30   Ct Angio Chest Pe W And/or Wo Contrast  Result Date: 10/10/2019 CLINICAL DATA:  78 year old male with shortness of breath. EXAM: CT ANGIOGRAPHY CHEST WITH CONTRAST TECHNIQUE: Multidetector CT imaging of the chest was performed using the standard protocol during bolus administration of  intravenous contrast. Multiplanar CT image reconstructions and MIPs were obtained to evaluate the vascular anatomy. CONTRAST:  150mL OMNIPAQUE IOHEXOL 350 MG/ML SOLN COMPARISON:  Chest radiograph dated 10/10/2019. FINDINGS: Cardiovascular: There is no cardiomegaly or pericardial effusion. Mild atherosclerotic calcification of thoracic aorta. No aneurysmal dilatation or dissection. Coronary vascular calcification noted. Evaluation of the pulmonary arteries is limited due to suboptimal opacification and timing of the contrast. No definite large or central pulmonary artery embolus identified. Mediastinum/Nodes: No hilar adenopathy. Evaluation however is limited due to consolidative changes of the right lung. Focal area of soft tissue density to the right of the trachea (series 9, image 182) most consistent with azygos vein. Top-normal subcarinal lymph nodes. There is a small hiatal hernia. The esophagus and the thyroid gland are grossly unremarkable. No mediastinal fluid collection. Lungs/Pleura: There is a large right pleural effusion with associated mass effect and compressive atelectasis of the majority the right middle lobe and complete consolidation of the right lower lobe. Pneumonia or underlying mass is not excluded. Clinical correlation and follow-up to resolution recommended. Several calcified granuloma noted in the left upper lobe. There is no pneumothorax. The central airways are patent. Mucus secretions noted in the upper trachea. Upper Abdomen: Partially visualized cystic lesion from the interpolar left kidney. Musculoskeletal: Osteopenia. Old lower thoracic compression changes. No acute osseous pathology. Review of the MIP images confirms the above findings. IMPRESSION: 1. No definite CT evidence of central pulmonary artery embolus. 2. Large right pleural effusion with associated mass effect  and complete compressive atelectasis of the right lower lobe and atelectasis of the majority of the right middle  lobe. Pneumonia or underlying mass is not excluded. Clinical correlation and follow-up to resolution recommended. 3. Aortic Atherosclerosis (ICD10-I70.0). Electronically Signed   By: Anner Crete M.D.   On: 10/10/2019 19:01   Ct Cervical Spine Wo Contrast  Result Date: 10/10/2019 CLINICAL DATA:  Fall with facial injury. EXAM: CT HEAD WITHOUT CONTRAST CT MAXILLOFACIAL WITHOUT CONTRAST CT CERVICAL SPINE WITHOUT CONTRAST TECHNIQUE: Multidetector CT imaging of the head, cervical spine, and maxillofacial structures were performed using the standard protocol without intravenous contrast. Multiplanar CT image reconstructions of the cervical spine and maxillofacial structures were also generated. COMPARISON:  None. FINDINGS: CT HEAD FINDINGS Brain: No evidence of hemorrhage or swelling. No acute infarct, mass, or hydrocephalus. CSF density mass in the right middle cranial fossa with local mass effect, up to 4.6 cm in transverse span and 5.5 cm craniocaudal-consistent with arachnoid cyst. Generalized brain atrophy. There is dural-based calcification along the right cerebral convexity, question sequela of remote subdural hemorrhage Vascular: Atherosclerotic calcification. Skull: No calvarial fracture. CT MAXILLOFACIAL FINDINGS Osseous: Bilateral nasal arch fracture with comminution and mild depression on the right. Gas is seen in the anterior nasal septum where there is subtle fracture. No orbital or ethmoid continuation is seen. The mandible is intact and located. Orbits: Bilateral cataract resection. No evidence of postseptal injury. Sinuses: Expansive frontal sinuses. High-density frothy material in the right frontal sinus and right nasal cavity/nasopharynx is presumably epistaxis. In the inferior right frontal sinus is likely epistaxis Soft tissues: Soft tissue swelling about the nose. No opaque foreign body. CT CERVICAL SPINE FINDINGS Alignment: No traumatic malalignment. Skull base and vertebrae: Negative for  acute fracture Soft tissues and spinal canal: No prevertebral fluid or swelling. No visible canal hematoma. Disc levels: Lower cervical degenerative disc narrowing and ridging. Generalized mild cervical facet spurring. Upper chest: Right-sided pleural effusion, reference dedicated chest CT IMPRESSION: 1. Comminuted bilateral nasal arch fracture with mild depression. 2. Anterior and inferior nasal septum fracture. The fracture is nondisplaced but the septum is deviated to the left. 3. No evidence of intracranial or cervical spine injury. 4. Large arachnoid cyst in the right middle cranial fossa. Electronically Signed   By: Monte Fantasia M.D.   On: 10/10/2019 19:06   US Abdomen Complete  Result Date: 10/10/2019 CLINICAL DATA:  Abnormal liver function EXAM: ABDOMEN ULTRASOUND COMPLETE COMPARISON:  None. FINDINGS: Gallbladder: No gallstones or wall thickening visualized. No sonographic Murphy sign noted by sonographer. Common bile duct: Diameter: 3 mm Liver: No focal lesion identified. Within normal limits in parenchymal echogenicity. Portal vein is patent on color Doppler imaging with normal direction of blood flow towards the liver. IVC: No abnormality visualized. Pancreas: Limited visualization due to bowel gas. Spleen: Size and appearance within normal limits. Right Kidney: Length: 10.4 cm. Renal cyst measures 5.0 x 4.1 x 4.7 cm Left Kidney: Length: 10.9 cm. Renal cyst measures 9.0 x 8.1 x 9.2 cm Abdominal aorta: No aneurysm visualized. Other findings: None. IMPRESSION: Bilateral renal cysts.  Otherwise normal abdominal ultrasound. Electronically Signed   By: Ulyses Jarred M.D.   On: 10/10/2019 22:35   Dg Chest Port 1 View  Result Date: 10/13/2019 CLINICAL DATA:  Pleural effusion. EXAM: PORTABLE CHEST 1 VIEW COMPARISON:  October 12, 2019. FINDINGS: The heart size and mediastinal contours are within normal limits. Left lung is clear. Stable position of right-sided chest tube with small right pleural  effusion. No  pneumothorax is noted. Stable right basilar atelectasis or infiltrate is noted. The visualized skeletal structures are unremarkable. IMPRESSION: Stable position of right-sided chest tube with small right pleural effusion. Stable right basilar atelectasis or infiltrate is noted. No pneumothorax is noted. Electronically Signed   By: Marijo Conception M.D.   On: 10/13/2019 08:08   Dg Chest Port 1 View  Result Date: 10/12/2019 CLINICAL DATA:  Chest tube. EXAM: PORTABLE CHEST 1 VIEW COMPARISON:  10/11/2019. FINDINGS: Right chest tube noted with tip over the lower right chest. Interval improvement of right pleural effusion. No pneumothorax noted. Right base atelectatic changes and/or infiltrate noted. Calcified pulmonary nodules again noted consistent granulomas. Heart size stable. IMPRESSION: 1. Right chest tube noted with tip over the lower right chest. Interval improvement of right pleural effusion. No pneumothorax noted. 2. Right base atelectatic changes and/or infiltrate noted. Electronically Signed   By: Marcello Moores  Register   On: 10/12/2019 07:10   Portable Chest  Result Date: 10/11/2019 CLINICAL DATA:  Chest tube placement EXAM: PORTABLE CHEST 1 VIEW COMPARISON:  October 11, 2019 FINDINGS: A right chest tube is been placed in the interval. A moderate right pleural effusion with underlying opacity remains. No pneumothorax. Granuloma are seen in the left lung. No other changes. IMPRESSION: A right chest tube has been placed in the interval, in good position. A moderate right effusion with underlying opacity remains. No pneumothorax. Electronically Signed   By: Dorise Bullion III M.D   On: 10/11/2019 15:17   Dg Chest Port 1 View  Result Date: 10/10/2019 CLINICAL DATA:  Shortness of breath. EXAM: PORTABLE CHEST 1 VIEW COMPARISON:  None. FINDINGS: There is a large right pleural effusion with underlying opacity. Two calcified granulomata are seen in the left lung. The right hilum and right heart  border are not well seen due to the right-sided effusion. Within visualized limits, the heart, hila, and mediastinum are unremarkable. No pneumothorax. No other acute abnormalities. IMPRESSION: There is a large right-sided pleural effusion with underlying opacity which is nonspecific. No other acute abnormalities. Recommend short-term follow-up imaging to ensure resolution. Electronically Signed   By: Dorise Bullion III M.D   On: 10/10/2019 17:39   Ct Maxillofacial Wo Cm  Result Date: 10/10/2019 CLINICAL DATA:  Fall with facial injury. EXAM: CT HEAD WITHOUT CONTRAST CT MAXILLOFACIAL WITHOUT CONTRAST CT CERVICAL SPINE WITHOUT CONTRAST TECHNIQUE: Multidetector CT imaging of the head, cervical spine, and maxillofacial structures were performed using the standard protocol without intravenous contrast. Multiplanar CT image reconstructions of the cervical spine and maxillofacial structures were also generated. COMPARISON:  None. FINDINGS: CT HEAD FINDINGS Brain: No evidence of hemorrhage or swelling. No acute infarct, mass, or hydrocephalus. CSF density mass in the right middle cranial fossa with local mass effect, up to 4.6 cm in transverse span and 5.5 cm craniocaudal-consistent with arachnoid cyst. Generalized brain atrophy. There is dural-based calcification along the right cerebral convexity, question sequela of remote subdural hemorrhage Vascular: Atherosclerotic calcification. Skull: No calvarial fracture. CT MAXILLOFACIAL FINDINGS Osseous: Bilateral nasal arch fracture with comminution and mild depression on the right. Gas is seen in the anterior nasal septum where there is subtle fracture. No orbital or ethmoid continuation is seen. The mandible is intact and located. Orbits: Bilateral cataract resection. No evidence of postseptal injury. Sinuses: Expansive frontal sinuses. High-density frothy material in the right frontal sinus and right nasal cavity/nasopharynx is presumably epistaxis. In the inferior  right frontal sinus is likely epistaxis Soft tissues: Soft tissue swelling about the nose.  No opaque foreign body. CT CERVICAL SPINE FINDINGS Alignment: No traumatic malalignment. Skull base and vertebrae: Negative for acute fracture Soft tissues and spinal canal: No prevertebral fluid or swelling. No visible canal hematoma. Disc levels: Lower cervical degenerative disc narrowing and ridging. Generalized mild cervical facet spurring. Upper chest: Right-sided pleural effusion, reference dedicated chest CT IMPRESSION: 1. Comminuted bilateral nasal arch fracture with mild depression. 2. Anterior and inferior nasal septum fracture. The fracture is nondisplaced but the septum is deviated to the left. 3. No evidence of intracranial or cervical spine injury. 4. Large arachnoid cyst in the right middle cranial fossa. Electronically Signed   By: Monte Fantasia M.D.   On: 10/10/2019 19:06   Ir Thoracentesis Asp Pleural Space W/img Guide  Result Date: 10/15/2019 INDICATION: Patient with history of complex right parapneumonic pleural effusion/empyema with prior chest tube placement by CCM on 10/11/2019; now with small residual right pleural effusion. Request received for diagnostic and therapeutic right thoracentesis. EXAM: ULTRASOUND GUIDED DIAGNOSTIC AND THERAPEUTIC RIGHT THORACENTESIS MEDICATIONS: None COMPLICATIONS: None immediate. PROCEDURE: An ultrasound guided thoracentesis was thoroughly discussed with the patient and questions answered. The benefits, risks, alternatives and complications were also discussed. The patient understands and wishes to proceed with the procedure. Written consent was obtained. Ultrasound was performed to localize and mark an adequate pocket of fluid in the right chest. The area was then prepped and draped in the normal sterile fashion. 1% Lidocaine was used for local anesthesia. Under ultrasound guidance a 19 gauge, 7-cm, Yueh catheter was introduced. Thoracentesis was performed. The  catheter was removed and a dressing applied. FINDINGS: A total of approximately 40 cc of hazy, amber/blood-tinged fluid was removed. Samples were sent to the laboratory as requested by the clinical team. Despite catheter manipulation only the above amount of fluid could be removed at this time. IMPRESSION: Successful ultrasound guided diagnostic and therapeutic right thoracentesis yielding 40 cc of pleural fluid. Read by: Rowe Robert, PA-C Electronically Signed   By: Sandi Mariscal M.D.   On: 10/15/2019 12:14   US Thoracentesis Asp Pleural Space W/img Guide  Result Date: 10/11/2019 INDICATION: Shortness of breath. Large right pleural effusion. Request for diagnostic and therapeutic thoracentesis. EXAM: ULTRASOUND GUIDED RIGHT THORACENTESIS MEDICATIONS: None. COMPLICATIONS: None immediate. Postprocedural chest x-ray negative for pneumothorax. PROCEDURE: An ultrasound guided thoracentesis was thoroughly discussed with the patient and questions answered. The benefits, risks, alternatives and complications were also discussed. The patient understands and wishes to proceed with the procedure. Written consent was obtained. Ultrasound of the right chest demonstrates large but severely loculated effusion with innumerable septations. Ultrasound was performed to localize and mark an adequate pocket of fluid in the right chest. The area was then prepped and draped in the normal sterile fashion. 1% Lidocaine was used for local anesthesia. Under ultrasound guidance a 6 Fr Safe-T-Centesis catheter was introduced. Thoracentesis was performed. Despite repositioning of the catheter multiple times, only approximately 525 mL was able to be removed. The catheter was removed and a dressing applied. FINDINGS: A total of approximately 525 mL of clear yellow fluid was removed. Samples were sent to the laboratory as requested by the clinical team. IMPRESSION: Large right, severely loculated pleural effusion. Successful ultrasound guided  right thoracentesis yielding 525 mL of pleural fluid. Read by: Ascencion Dike PA-C Electronically Signed   By: Aletta Edouard M.D.   On: 10/11/2019 11:35    Microbiology: Recent Results (from the past 240 hour(s))  Culture, blood (routine x 2)     Status: None  Collection Time: 10/10/19  5:15 PM   Specimen: BLOOD RIGHT ARM  Result Value Ref Range Status   Specimen Description BLOOD RIGHT ARM  Final   Special Requests   Final    BOTTLES DRAWN AEROBIC AND ANAEROBIC Blood Culture adequate volume   Culture   Final    NO GROWTH 5 DAYS Performed at [facility], [address], [place], [place] [address]    Report Status 10/15/2019 FINAL  Final  Culture, blood (routine x 2)     Status: None   Collection Time: 10/10/19  5:22 PM   Specimen: BLOOD  Result Value Ref Range Status   Specimen Description BLOOD RIGHT ANTECUBITAL  Final   Special Requests   Final    BOTTLES DRAWN AEROBIC AND ANAEROBIC Blood Culture adequate volume   Culture   Final    NO GROWTH 5 DAYS Performed at [facility], [place] [address], [place], [place] [address]    Report Status 10/15/2019 FINAL  Final  Urine culture     Status: None   Collection Time: 10/10/19  6:08 PM   Specimen: Urine, Clean Catch  Result Value Ref Range Status   Specimen Description URINE, CLEAN CATCH  Final   Special Requests NONE  Final   Culture   Final    NO GROWTH Performed at [facility], [place] [address] [place], [place] [address]    Report Status 10/11/2019 FINAL  Final  SARS CORONAVIRUS 2 (TAT 6-24 HRS) Nasopharyngeal Nasopharyngeal Swab     Status: None   Collection Time: 10/10/19  9:56 PM   Specimen: Nasopharyngeal Swab  Result Value Ref Range Status   SARS Coronavirus 2 NEGATIVE NEGATIVE Final    Comment: (NOTE) SARS-CoV-2 target nucleic acids are NOT DETECTED. The SARS-CoV-2 RNA is generally detectable in upper and lower respiratory specimens during the acute phase of infection. Negative results do not  preclude SARS-CoV-2 infection, do not rule out co-infections with other pathogens, and should not be used as the sole basis for treatment or other patient management decisions. Negative results must be combined with clinical observations, patient history, and epidemiological information. The expected result is Negative. Fact Sheet for Patients: SugarRoll.be Fact Sheet for Healthcare Providers: https://www.woods-mathews.com/ This test is not yet approved or cleared by the [place] FDA and  has been authorized for detection and/or diagnosis of SARS-CoV-2 by FDA under an Emergency Use Authorization (EUA). This EUA will remain  in effect (meaning this test can be used) for the duration of the COVID-19 declaration under Section 56 4(b)(1) of the Act, 21 U.S.C. section 360bbb-3(b)(1), unless the authorization is terminated or revoked sooner. Performed at [facility], [place] [address] [place], [place] [address]   Gram stain     Status: None   Collection Time: 10/11/19 11:03 AM   Specimen: PATH Cytology Pleural fluid  Result Value Ref Range Status   Specimen Description PLEURAL RIGHT  Final   Special Requests NONE  Final   Gram Stain   Final    FEW WBC PRESENT, PREDOMINANTLY PMN NO ORGANISMS SEEN Performed at [facility], [place] [address] [place],  [address]    Report Status 10/11/2019 FINAL  Final  Acid Fast Smear (AFB)     Status: None   Collection Time: 10/11/19 11:03 AM   Specimen: PATH Cytology Pleural fluid  Result Value Ref Range Status   AFB Specimen Processing Concentration  Final   Acid Fast Smear Negative  Final    Comment: (NOTE) Performed At: [address] [facility] [place], [place] [id number] Rush Farmer MD [id number]    Source (AFB) PLEURAL  Final    Comment: RIGHT Performed at [facility], [place] [address], [place], [place] [address]   Culture, body fluid-bottle     Status:  None (Preliminary result)   Collection Time: 10/11/19 11:03 AM   Specimen: Pleura  Result Value Ref Range Status   Specimen Description PLEURAL RIGHT  Final   Special Requests NONE  Final   Gram Stain   Final    GRAM POSITIVE COCCI ANAEROBIC BOTTLE ONLY Gram Stain Report Called to,Read Back By and Verified With: D Susann Givens RN [id number] 10/15/19 A BROWNING    Culture   Final    CULTURE REINCUBATED FOR BETTER GROWTH CORRECTED ON 12/04 AT 1003: PREVIOUSLY REPORTED AS NO GROWTH 5 DAYS Performed at [facility], [place] [address] [place], [place] [address]    Report Status PENDING  Incomplete  Culture, body fluid-bottle     Status: None   Collection Time: 10/11/19  3:27 PM   Specimen: Pleura  Result Value Ref Range Status   Specimen Description PLEURAL  Final   Special Requests NONE  Final   Culture   Final    NO GROWTH 5 DAYS Performed at [facility], [address], [place], [place] [address]    Report Status 10/16/2019 FINAL  Final  Gram stain     Status: None   Collection Time: 10/11/19  3:27 PM   Specimen: Pleura  Result Value Ref Range Status   Specimen Description PLEURAL  Final   Special Requests NONE  Final   Gram Stain   Final    RARE WBC PRESENT, PREDOMINANTLY PMN NO ORGANISMS SEEN Performed at [facility], [place] [address], [place], [place] [address]    Report Status 10/11/2019 FINAL  Final     Labs: Basic Metabolic Panel: Recent Labs  Lab 10/11/19 0235 10/12/19 0256 10/13/19 0259 10/14/19 0238 10/15/19 0313 10/16/19 0402  NA 140 139 141 137 139  --   K 3.7 4.0 3.6 3.6 3.7  --   CL 103 107 107 106 105  --   CO2 25 24 23 22 25   --   GLUCOSE 107* 206* 112* 138* 111*  --   BUN 12 15 16 13 9   --   CREATININE 0.81 1.01 0.88 0.72 0.72  --   CALCIUM 8.6* 8.3* 8.2* 7.8* 7.8*  --   MG  --   --   --   --   --  2.0  PHOS  --   --   --   --   --  2.9   Liver Function Tests: Recent Labs  Lab 10/10/19 1722 10/11/19 0235 10/14/19 0238  AST 120* 87*  112*  ALT 145* 123* 147*  ALKPHOS 119 114 80  BILITOT 1.6* 1.0 0.2*  PROT 7.0 6.3*  6.5 5.4*  ALBUMIN 2.2* 2.0* 1.6*   No results for input(s): LIPASE, AMYLASE in the last 168 hours. No results for input(s): AMMONIA in the last 168 hours. CBC: Recent Labs  Lab 10/10/19 1722 10/11/19 0235 10/12/19 0017 10/13/19 0259 10/14/19 0238 10/15/19 0313  WBC 17.5* 14.8*  --  14.1* 12.0* 11.9*  NEUTROABS 13.8*  --   --   --   --  7.9*  HGB 10.9* 10.5* 11.4* 10.0* 9.8* 10.3*  HCT 33.8* 32.3*  31.5*  34.0* 31.1* 29.8* 32.1*  MCV 84.3 85.0  --  85.0 83.9 84.3  PLT 305 317  --  326 358 390   Cardiac Enzymes: Recent Labs  Lab 10/11/19 0235  CKTOTAL 71  CKMB 3.2   BNP: BNP (last 3 results) Recent Labs    10/11/19 0235  BNP 84.4    ProBNP (last 3 results) No results for input(s): PROBNP in the last 8760 hours.  CBG: Recent Labs  Lab 10/15/19 0621 10/15/19 1234 10/15/19 1709 10/15/19 2140 10/16/19 0624  GLUCAP 116* 122* 118* 154* 130*       Signed:  Nita Sells MD   [facility] 10/16/2019, 10:17 AM  30

## 2019-10-16 NOTE — Progress Notes (Signed)
Patient in a stable condition, discharge education reviewed with patient he verbalized understanding, iv removed, tele dc ccmd notified, patient belongings at bedside, patient to be transported home by his wife.  

## 2019-10-16 NOTE — Plan of Care (Signed)
  Problem: Clinical Measurements: Goal: Respiratory complications will improve Outcome: Progressing   Problem: Nutrition: Goal: Adequate nutrition will be maintained Outcome: Progressing   Problem: Pain Managment: Goal: General experience of comfort will improve Outcome: Progressing    

## 2019-10-16 NOTE — Progress Notes (Addendum)
NAME:  Frank Carlson, MRN:  [id number], DOB:  [birth year], LOS: 6 ADMISSION DATE:  10/10/2019, CONSULTATION DATE:  12/1 REFERRING MD:  Domenic Polite, CHIEF COMPLAINT:  Loculated pleural effusion    Brief History    The patient is an 78 year old gentleman with past medical history of non-insulin-dependent diabetes and hyperlipidemia who presents after having a fall while walking in his neighborhood.  He was feeling short of breath while walking, and then fell forward.  He did sustain some abrasions to his nose and forehead with a small amount of bleeding.  He has a remote smoking history, quit over 60 years ago.  He denies having any pneumonia recently, but was having some nasal congestion and cough that was treated with doxycycline a few weeks ago.  He additionally reports having symptoms of cough earlier in the year around May or June which was attributed to mold.  He has not had any sick contacts, he has not had any Covid exposure.  He denies any fevers chills night sweats or weight loss.  His main issue is a dry cough and worsening dyspnea on exertion.   A chest x-ray demonstrated large right-sided loculated pleural effusion which was confirmed on CT chest.  He underwent ultrasound-guided thoracentesis in the IR with about 500 cc of serous fluid drained.  Pulmonary is being consulted to assist with management of the persistent loculated pleural effusion.  Past Medical History  Type 2 diabetes  Hyperlipidemia Color blindness BPH  Significant Hospital Events   11/28 Admitted   CT angio chest 11/28 > No PE. Large right pleural effusion with associated mass effect andcomplete compressive atelectasis of the right lower lobe and atelectasis of the majority of the right middle lobe. Pneumonia or underlying mass is not excluded.   11/29 > US guided right thoracentesis, placement of right chest tube with insilation of interpleural fibrinolytic  CT head and maxillofacial 11/28 > Comminuted  bilateral nasal arch fracture with mild depression. Anterior and inferior nasal septum fracture. The fracture is nondisplaced but the septum is deviated to the left. No evidence of intracranial or cervical spine injury. Large arachnoid cyst in the right middle cranial fossa.  Chest CT 12/1 > Marked interval decrease in size of the large right-sided pleural effusion, perhaps empyema, with a chest tube in place. A thin ring of fluid remains mix with locules of gas along with a small amount of fluid in the dependent right lower chest. Improved aeration of the right middle and lower lobe still with some lateral middle lobe consolidation and posterior lower lobe consolidation. Continued follow-up is suggested to ensure resolution. Along the exit site for the catheter there is some mild kinking of the catheter as it passes inferiorly along the right chest wall. Correlate with catheter function.   12/2 - Sitting up in bed with no acute complains, states he feels much better with 75% improvement compared to admission.   12/3 - Chest Tube D/C'd   12/4 - For discharge home with long term Augmentin  Consults:  PCCM  IR  Procedures:  11/29 > US guided right thoracentesis, placement of right chest tube with insilation of interpleural fibrinolytic   Significant Diagnostic Tests:  Results for JISAIAH, COURTLAND" (MRN [id number]) as of 10/15/2019 09:36  Ref. Range 10/11/2019 11:03 10/11/2019 15:38  LD, Fluid Latest Ref Range: 3 - 23 U/L 712 (H) 617 (H)    CT angio chest 11/28 > No PE. Large right pleural effusion with associated mass  effect andcomplete compressive atelectasis of the right lower lobe and atelectasis of the majority of the right middle lobe. Pneumonia or underlying mass is not excluded.   CT head and maxillofacial 11/28 > Comminuted bilateral nasal arch fracture with mild depression. Anterior and inferior nasal septum fracture. The fracture is nondisplaced but the septum is  deviated to the left. No evidence of intracranial or cervical spine injury. Large arachnoid cyst in the right middle cranial fossa.  Chest CT 12/1 > Marked interval decrease in size of the large right-sided pleural effusion, perhaps empyema, with a chest tube in place. A thin ring of fluid remains mix with locules of gas along with a small amount of fluid in the dependent right lower chest. Improved aeration of the right middle and lower lobe still with some lateral middle lobe consolidation and posterior lower lobe consolidation. Continued follow-up is suggested to ensure resolution. Along the exit site for the catheter there is some mild kinking of the catheter as it passes inferiorly along the right chest wall. Correlate with catheter function.   Micro Data:  RVP 11/28 > Bordetella PCR 11/28 > Pleural culture 11/29>  AFB smear 11/29 > Urine culture 11/28 > neg Pleura grams stain > neg Blood culture 11/29 > neg Results for JAMUAL, BASS" (MRN [id number]) as of 10/15/2019 09:36  Ref. Range 10/10/2019 18:08  Strep Pneumo Urinary Antigen Latest Ref Range: NEGATIVE  NEGATIVE  L. pneumophila Serogp 1 Ur Ag Latest Ref Range: Negative  Negative    Antimicrobials:  Cefepime 11/28 >  Augmentin 12/3>> Ancef 10/11>> 10/11 Vanc 11/28>> 11/29  Anti-infectives (From admission, onward)   Start     Dose/Rate Route Frequency Ordered Stop   10/16/19 0000  amoxicillin-clavulanate (AUGMENTIN) 875-125 MG tablet     1 tablet Oral Every 12 hours 10/16/19 1016 10/31/19 2359   10/15/19 1100  amoxicillin-clavulanate (AUGMENTIN) 875-125 MG per tablet 1 tablet     1 tablet Oral Every 12 hours 10/15/19 1038     10/11/19 1830  vancomycin (VANCOCIN) 2,000 mg in sodium chloride 0.9 % 500 mL IVPB  Status:  Discontinued     2,000 mg 250 mL/hr over 120 Minutes Intravenous Every 24 hours 10/11/19 1100 10/11/19 1127   10/10/19 2200  azithromycin (ZITHROMAX) 500 mg in sodium chloride 0.9 % 250 mL  IVPB  Status:  Discontinued     500 mg 250 mL/hr over 60 Minutes Intravenous Every 24 hours 10/10/19 2128 10/15/19 1038   10/10/19 1830  vancomycin (VANCOCIN) 1,750 mg in sodium chloride 0.9 % 500 mL IVPB  Status:  Discontinued     1,750 mg 250 mL/hr over 120 Minutes Intravenous  Once 10/10/19 1816 10/10/19 1818   10/10/19 1830  vancomycin (VANCOCIN) 1,750 mg in sodium chloride 0.9 % 500 mL IVPB  Status:  Discontinued     1,750 mg 250 mL/hr over 120 Minutes Intravenous Every 24 hours 10/10/19 1818 10/11/19 1100   10/10/19 1830  ceFEPIme (MAXIPIME) 2 g in sodium chloride 0.9 % 100 mL IVPB  Status:  Discontinued     2 g 200 mL/hr over 30 Minutes Intravenous Every 8 hours 10/10/19 1820 10/15/19 1038   10/10/19 1800  vancomycin (VANCOCIN) IVPB 1000 mg/200 mL premix  Status:  Discontinued     1,000 mg 200 mL/hr over 60 Minutes Intravenous  Once 10/10/19 1754 10/10/19 1757   10/10/19 1800  ceFEPIme (MAXIPIME) 2 g in sodium chloride 0.9 % 100 mL IVPB  Status:  Discontinued  2 g 200 mL/hr over 30 Minutes Intravenous  Once 10/10/19 1754 10/10/19 1820       Interim history/subjective:  Awake and alert. No subjective dyspnea States he is ready to go home Sats 96% on RA Right CT site with clean dry dressing For discharge today with long term Augmentin Follow up appointment 12/14 2020 at 2 pm with Dr. Lutricia Feil Pulmonary   Objective   Blood pressure 123/66, pulse 74, temperature 98.1 F (36.7 C), temperature source Oral, resp. rate 20, height 6' 0.5" (1.842 m), weight 84.8 kg, SpO2 96 %.        Intake/Output Summary (Last 24 hours) at 10/16/2019 1030 Last data filed at 10/16/2019 0635 Gross per 24 hour  Intake 240 ml  Output 2140 ml  Net -1900 ml   Filed Weights   10/11/19 0421 10/11/19 0600 10/14/19 0300  Weight: 81.6 kg 88 kg 84.8 kg     General Appearance:  Awake alert and appropriate on  RA. In NAD HEENT:  NCAT, No LAD, No JVD, conjunctiva/corneas - clear , PERRLA     Lungs: Bilateral chest excursion, Clear to auscultation bilaterally, RT chest tube site with dressing CDI , no drainage , trachea midline Heart:  S1 and S2 normal, RRR, No RMG, NSR per tele Abdomen:  Soft, NT. ND, no masses, no organomegaly Genitalia / Rectal:  Not done Extremities:  Warm and dry, no obvious deformities Skin:  intact in exposed areas .Warm and dry, few bruises noted , no rash or lesions Neurologic: Awake and alert and oriented x 3, MAE x 4, follows commands, appropriate      Resolved Hospital Problem list     Assessment & Plan:  Complex pleural effusion  Loculated pleural effusion, likely empyema - good response to tpa / dnase (2 doses) CT remove 12/3 For discharge home 12/4 P: DC home 12/4 per primary team  with 2-3 week antibiotics ( Augmentin) Follow up in the Pulmonary Office 12/14 2020 with CXR to reassess for re-accumulation determine If need for pleurex catheter Ambulatory saturationprior to discharge Rest per primary  Best practice:  Diet: Heart healthy Pain/Anxiety/Delirium protocol (if indicated): N/A VAP protocol (if indicated): N/A DVT prophylaxis: Lovenox GI prophylaxis: PPI Glucose control: monitor Mobility: up with assistance  Code Status: Full Family Communication: Per primary  Disposition: Progressive care   CC APP Time: 35 minutes  LABS    PULMONARY No results for input(s): PHART, PCO2ART, PO2ART, HCO3, TCO2, O2SAT in the last 168 hours.  Invalid input(s): PCO2, PO2  CBC Recent Labs  Lab 10/13/19 0259 10/14/19 0238 10/15/19 0313  HGB 10.0* 9.8* 10.3*  HCT 31.1* 29.8* 32.1*  WBC 14.1* 12.0* 11.9*  PLT 326 358 390    COAGULATION Recent Labs  Lab 10/10/19 1722  INR 1.1    CARDIAC  No results for input(s): TROPONINI in the last 168 hours. No results for input(s): PROBNP in the last 168 hours.   CHEMISTRY Recent Labs  Lab 10/11/19 0235 10/12/19 0256 10/13/19 0259 10/14/19 0238 10/15/19 0313 10/16/19 0402   NA 140 139 141 137 139  --   K 3.7 4.0 3.6 3.6 3.7  --   CL 103 107 107 106 105  --   CO2 25 24 23 22 25   --   GLUCOSE 107* 206* 112* 138* 111*  --   BUN 12 15 16 13 9   --   CREATININE 0.81 1.01 0.88 0.72 0.72  --   CALCIUM 8.6* 8.3* 8.2* 7.8* 7.8*  --  MG  --   --   --   --   --  2.0  PHOS  --   --   --   --   --  2.9   Estimated Creatinine Clearance: 82.1 mL/min (by C-G formula based on SCr of 0.72 mg/dL).   LIVER Recent Labs  Lab 10/10/19 1722 10/11/19 0235 10/14/19 0238  AST 120* 87* 112*  ALT 145* 123* 147*  ALKPHOS 119 114 80  BILITOT 1.6* 1.0 0.2*  PROT 7.0 6.3*  6.5 5.4*  ALBUMIN 2.2* 2.0* 1.6*  INR 1.1  --   --      INFECTIOUS Recent Labs  Lab 10/10/19 1722 10/10/19 2116  LATICACIDVEN 1.9 1.0  PROCALCITON 0.27  --      ENDOCRINE CBG (last 3)  Recent Labs    10/15/19 1709 10/15/19 2140 10/16/19 0624  GLUCAP 118* 154* 130*         IMAGING x48h  - image(s) personally visualized  -   highlighted in bold Dg Chest 1 View  Result Date: 10/15/2019 CLINICAL DATA:  Post right-sided thoracentesis. EXAM: CHEST  1 VIEW COMPARISON:  Chest radiograph-earlier same day; 10/13/2019; chest CT-10/13/2019 FINDINGS: Grossly unchanged cardiac silhouette and mediastinal contours. Interval reduction in persistent small potentially partially loculated right-sided effusion post thoracentesis. No pneumothorax. Improved aeration the right lung base with persistent right perihilar and basilar heterogeneous opacities. The left hemithorax remains well aerated. No new focal airspace opacities. Calcified granuloma are again seen within the left mid lung. No acute osseous abnormalities. IMPRESSION: 1. Interval reduction in persistent small potentially partially loculated right-sided effusion post thoracentesis. No pneumothorax. 2. Improved aeration the right lung base with persistent right basilar opacities, atelectasis versus infiltrate. 3.  Stable positioning of support  apparatus. 4. Stable sequela of previous granulomatous infection. Electronically Signed   By: Sandi Mariscal M.D.   On: 10/15/2019 12:31   Dg Chest 2 View  Result Date: 10/15/2019 CLINICAL DATA:  Follow-up pleural effusion. EXAM: CHEST - 2 VIEW COMPARISON:  10/13/2019 FINDINGS: Right-sided chest tube is in place. No significant pneumothorax identified. Right pleural effusion and right basilar atelectasis/infiltrate is unchanged. Left lung clear. IMPRESSION: 1. Stable position of right chest tube with small right pleural effusion. No pneumothorax identified Electronically Signed   By: Kerby Moors M.D.   On: 10/15/2019 09:19   Ir Thoracentesis Asp Pleural Space W/img Guide  Result Date: 10/15/2019 INDICATION: Patient with history of complex right parapneumonic pleural effusion/empyema with prior chest tube placement by CCM on 10/11/2019; now with small residual right pleural effusion. Request received for diagnostic and therapeutic right thoracentesis. EXAM: ULTRASOUND GUIDED DIAGNOSTIC AND THERAPEUTIC RIGHT THORACENTESIS MEDICATIONS: None COMPLICATIONS: None immediate. PROCEDURE: An ultrasound guided thoracentesis was thoroughly discussed with the patient and questions answered. The benefits, risks, alternatives and complications were also discussed. The patient understands and wishes to proceed with the procedure. Written consent was obtained. Ultrasound was performed to localize and mark an adequate pocket of fluid in the right chest. The area was then prepped and draped in the normal sterile fashion. 1% Lidocaine was used for local anesthesia. Under ultrasound guidance a 19 gauge, 7-cm, Yueh catheter was introduced. Thoracentesis was performed. The catheter was removed and a dressing applied. FINDINGS: A total of approximately 40 cc of hazy, amber/blood-tinged fluid was removed. Samples were sent to the laboratory as requested by the clinical team. Despite catheter manipulation only the above amount of  fluid could be removed at this time. IMPRESSION: Successful ultrasound guided  diagnostic and therapeutic right thoracentesis yielding 40 cc of pleural fluid. Read by: Rowe Robert, PA-C Electronically Signed   By: Sandi Mariscal M.D.   On: 10/15/2019 12:14   Magdalen Spatz, MSN, AGACNP-BC [place] Pager # [phone] After 4 pm please call [phone] 10/16/2019 11:09 AM

## 2019-10-16 NOTE — Telephone Encounter (Signed)
Transition Care Management Follow-up Telephone Call  Date of discharge and from where: 10/16/2019, [facility]  How have you been since you were released from the hospital? Patient states that he is much better since getting released home. He has some weakness but is definitely getting stronger.  Any questions or concerns? No   Items Reviewed:  Did the pt receive and understand the discharge instructions provided? Yes   Medications obtained and verified? Yes   Any new allergies since your discharge? No   Dietary orders reviewed? Yes  Do you have support at home? Yes   Functional Questionnaire: (I = Independent and D = Dependent) ADLs: I  Bathing/Dressing- I  Meal Prep- I  Eating- I  Maintaining continence- I  Transferring/Ambulation- I  Managing Meds- I  Follow up appointments reviewed:   PCP Hospital f/u appt confirmed? Yes  Scheduled to see Dr. Silvio Pate  on 10/27/2019 @ 8 am.  [facility] f/u appt confirmed? Yes  Scheduled to see Pulmonary  on 10/26/2019 @ 2 pm.  Are transportation arrangements needed? No   If their condition worsens, is the pt aware to call PCP or go to the Emergency Dept.? Yes  Was the patient provided with contact information for the PCP's office or ED? Yes  Was to pt encouraged to call back with questions or concerns? Yes

## 2019-10-18 LAB — CULTURE, BODY FLUID W GRAM STAIN -BOTTLE

## 2019-10-26 ENCOUNTER — Encounter: Payer: Self-pay | Admitting: Internal Medicine

## 2019-10-26 ENCOUNTER — Ambulatory Visit (INDEPENDENT_AMBULATORY_CARE_PROVIDER_SITE_OTHER): Payer: Medicare HMO | Admitting: Internal Medicine

## 2019-10-26 ENCOUNTER — Other Ambulatory Visit: Payer: Self-pay

## 2019-10-26 VITALS — BP 116/56 | HR 83 | Temp 97.6°F | Ht 72.0 in | Wt 181.6 lb

## 2019-10-26 DIAGNOSIS — J869 Pyothorax without fistula: Secondary | ICD-10-CM | POA: Diagnosis not present

## 2019-10-26 NOTE — Patient Instructions (Signed)
Come see me again if you have any breathing problems.   Talk to your family doctor about the heart rate.

## 2019-10-26 NOTE — Progress Notes (Signed)
Frank Carlson    [id number]    Jul 31, 1941  Primary Care Physician:Letvak, Theophilus Kinds, MD Date of Appointment: 10/26/2019 Established Patient Visit  Chief complaint:   Chief Complaint  Patient presents with   Hospitalization Follow-up    fatigued, bradycardia,      HPI: Patient was previously seen by myself in the hospital for empyema.  He underwent thoracentesis as well as right-sided chest tube placement with fibrinolytic therapy intrapleural.  He had resolution of his symptoms and was treated with an empiric course of antibiotics.  He now presents for hospital follow-up.  Interval Updates: Since hospital follow-up he has been feeling a bit tired, and has noticed his HR ranging from 45-72.  Otherwise appetite is good, no fevers or chills.  He is finishing a course of augmentin on 12/19. No diarrhea.   I have reviewed the patient's family social and past medical history and updated as appropriate.  The patient is a retired Chief Financial Officer. He lives at home with his wife.   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   CONCUSSION WITH LOC OF 30 MINUTES OR LESS 11/01/2009   Diabetes mellitus without complication ([place])    TYPE 2   ED (erectile dysfunction)    HEARING LOSS, BILATERAL 11/01/2009   Wears hearing aids   Hyperlipidemia    JOINT STIFFNESS, HAND 10/28/2008   Pleural effusion 10/2019   Pneumonia    Type II or unspecified type diabetes mellitus with neurological manifestations, not stated as uncontrolled(250.60) 1/15   Type 2    Past Surgical History:  Procedure Laterality Date   CATARACT EXTRACTION Bilateral 07/2010   OD with IOL   COLONOSCOPY W/ POLYPECTOMY     INCISION / DRAINAGE HAND / FINGER Right    INGUINAL HERNIA REPAIR Right 08/23/2015   Procedure: LAPAROSCOPIC RIGHT INGUINAL HERNIA REPAIR WITH MESH;  Surgeon: Ralene Ok, MD;   Location: [facility];  Service: General;  Laterality: Right;   INSERTION OF MESH Right 08/23/2015   Procedure: INSERTION OF MESH;  Surgeon: Ralene Ok, MD;  Location: [facility];  Service: General;  Laterality: Right;   IR THORACENTESIS ASP PLEURAL SPACE W/IMG GUIDE  10/15/2019   TONSILLECTOMY     VASECTOMY      Family History  Problem Relation Age of Onset   Alzheimer's disease Mother    Dementia Mother    Coronary artery disease Father    Heart disease Father    Coronary artery disease Other    Diabetes Other    Alzheimer's disease Sister    Dementia Brother    Alzheimer's disease Brother     Social History   Occupational History   Occupation: Therapist, occupational    Comment: Retired  Tobacco Use   Smoking status: Former Smoker    Packs/day: 1.50    Years: 5.00    Pack years: 7.50    Types: Cigarettes    Start date: 1955    Quit date: 11/25/1958    Years since quitting: 60.9   Smokeless tobacco: Never Used  Substance and Sexual Activity   Alcohol use: No   Drug use: No   Sexual activity: Yes    Partners: Female    Review of systems: Constitutional: No fevers, chills, night sweats, or weight loss. CV: No chest pain, or palpitations. Resp: Occasionally coughs up sputum, no hemoptysis  Physical  Exam: Blood pressure (!) 116/56, pulse 83, temperature 97.6 F (36.4 C), temperature source Temporal, height 6' (1.829 m), weight 181 lb 9.6 oz (82.4 kg), SpO2 98 %.  Gen:      No acute distress Eyes: EOMI, sclera anicteric ENT:  no nasal polyps, mucus membranes moist Neck:     Supple, no thyromegaly Lungs:    Decreased breath sounds right lung base, otherwise ctab, no wheezes or crackles CV:         Regular rate and rhythm; no murmurs, rubs, or gallops.  No pedal edema Abd:      + bowel sounds; soft, non-tender; no distension MSK: no acute synovitis of DIP or PIP joints, no mechanics hands.  Skin:      Warm and dry; no rashes Neuro: normal  speech, no focal facial asymmetry Psych: alert and oriented x3, normal mood and affect  Data Reviewed: Imaging: I have personally reviewed the chest imaging from his inpatient stay.  His admission chest x-ray shows a sizable loculated right-sided effusion, which is confirmed on CT chest.  When compared to his discharge chest x-ray which was on 12/3, there has been significant reduction of this right-sided effusion and improved aeration of the right lung base.  PFTs: None on file.  Labs: From his thoracentesis done on 12/3 his glucose was less than 20 which is consistent with bacterial empyema.  His additional pleural studies demonstrated exudative effusion based on LDH criteria, as well as protein criteria.  There was a neutrophil predominance to the cell count which confirms this suspicion. Immunization status: Immunization History  Administered Date(s) Administered   Influenza Split 08/19/2012   Influenza Whole 08/17/2008, 09/12/2009, 08/10/2010   Influenza, High Dose Seasonal PF 08/03/2017, 08/14/2018   Influenza,inj,Quad PF,6+ Mos 07/14/2013, 08/02/2014, 08/02/2015, 07/20/2016, 07/07/2019   Influenza-Unspecified 07/27/2019   Pneumococcal Conjugate-13 11/30/2013   Pneumococcal Polysaccharide-23 11/01/2009, 04/17/2017   Td 11/01/2009   Tdap 10/10/2019   Zoster 11/14/2009   Assessment:  Empyema - improved  Plan/Recommendations: Okay to stay off antibiotics once he finishes this course ont he 19th.   I asked him to talk to his PCP about his HR variation - may need holter monitor? Otherwise ok to see me prn - he has no residual symptoms from his empyema and evidence of clinical pneumonia has resolved.  He is up to date on influenza and pneumococcal vaccination.   Return to Care: Return if symptoms worsen or fail to improve.   Lenice Llamas, MD Pulmonary and [place]

## 2019-10-27 ENCOUNTER — Ambulatory Visit (INDEPENDENT_AMBULATORY_CARE_PROVIDER_SITE_OTHER): Payer: Medicare HMO | Admitting: Internal Medicine

## 2019-10-27 ENCOUNTER — Encounter: Payer: Self-pay | Admitting: Internal Medicine

## 2019-10-27 VITALS — BP 114/78 | HR 75 | Temp 97.2°F | Resp 24 | Ht 71.0 in | Wt 182.0 lb

## 2019-10-27 DIAGNOSIS — J181 Lobar pneumonia, unspecified organism: Secondary | ICD-10-CM | POA: Diagnosis not present

## 2019-10-27 DIAGNOSIS — E0840 Diabetes mellitus due to underlying condition with diabetic neuropathy, unspecified: Secondary | ICD-10-CM

## 2019-10-27 DIAGNOSIS — E1140 Type 2 diabetes mellitus with diabetic neuropathy, unspecified: Secondary | ICD-10-CM

## 2019-10-27 NOTE — Progress Notes (Signed)
Subjective:    Patient ID: Frank Carlson, male    DOB: 03-Aug-1941, 78 y.o.   MRN: [id number]  HPI Here for hospital follow up Reviewed all hospital studies, discharge summary, lab studies  Did have some cough--got antibiotic on virtual visit Seemed to improve Cough then recurred Was out walking and "legs quit on me" and he fell forward Broke nose and went to ER CXR showed lung abnormalities and effusion  Eventually had thoracentesis and pleurodesis Cultures negative but findings suggest bacterial empyema Treated with antibiotics and now finishing out with augmentin till 12/19  He had delirium Vague memories of some of his visit--and went "beserck" Finally cleared  Now he feels fine Mild feeling of fatigue remains Legs still weak---but feels stable walking  Current Outpatient Medications on File Prior to Visit  Medication Sig Dispense Refill  . acetaminophen (TYLENOL) 325 MG tablet Take 325 mg by mouth every 6 (six) hours as needed for headache (pain).    . Alcohol Swabs PADS 1 Package by Does not apply route as needed. 100 each 3  . amoxicillin-clavulanate (AUGMENTIN) 875-125 MG tablet Take 1 tablet by mouth every 12 (twelve) hours for 15 days. 30 tablet 0  . Calcium Carb-Ergocalciferol (CHEWABLE CALCIUM/D PO) Take 1 tablet by mouth at bedtime.     Marland Kitchen glucose blood (TRUE METRIX BLOOD GLUCOSE TEST) test strip Use to test blood sugar once a day. Dx Code: E08.40 (Patient taking differently: Use to test blood sugar twice a month  Dx Code: E08.40) 100 each 3  . metFORMIN (GLUCOPHAGE) 500 MG tablet Take 1 tablet (500 mg total) by mouth 2 (two) times daily with a meal. 180 tablet 3  . Multiple Vitamin (MULTIVITAMIN WITH MINERALS) TABS tablet Take 1 tablet by mouth every morning.     . Omega-3 Fatty Acids (FISH OIL PO) Take 1 capsule by mouth every morning.     . polyethylene glycol (MIRALAX / GLYCOLAX) 17 g packet Take 17 g by mouth daily as needed for moderate constipation. 14 each  0  . simvastatin (ZOCOR) 20 MG tablet TAKE 1 TABLET (20 MG TOTAL) BY MOUTH AT BEDTIME. 90 tablet 3  . TRUEPLUS LANCETS 30G MISC 1 Units by Does not apply route daily. Use to check blood sugar once a day. Dx Code [id number] (Patient taking differently: by Does not apply route See admin instructions. Use to check blood sugar twice a month. Dx Code E08.40) 100 each 3   No current facility-administered medications on file prior to visit.    Allergies  Allergen Reactions  . Bee Venom Other (See Comments)    Passed out    Past Medical History:  Diagnosis Date  . BPH (benign prostatic hypertrophy)   . COLONIC POLYPS, HX OF 03/15/2008  . 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 CONCUSSION WITH LOC OF 30 MINUTES OR LESS 11/01/2009  . Diabetes mellitus without complication ([place])    TYPE 2  . ED (erectile dysfunction)   . HEARING LOSS, BILATERAL 11/01/2009   Wears hearing aids  . Hyperlipidemia   . JOINT STIFFNESS, HAND 10/28/2008  . Pleural effusion 10/2019  . Pneumonia   . Type II or unspecified type diabetes mellitus with neurological manifestations, not stated as uncontrolled(250.60) 1/15   Type 2    Past Surgical History:  Procedure Laterality Date  . CATARACT EXTRACTION Bilateral 07/2010   OD with IOL  . COLONOSCOPY W/ POLYPECTOMY    . INCISION / DRAINAGE HAND / FINGER Right   . INGUINAL  HERNIA REPAIR Right 08/23/2015   Procedure: LAPAROSCOPIC RIGHT INGUINAL HERNIA REPAIR WITH MESH;  Surgeon: Ralene Ok, MD;  Location: [facility];  Service: General;  Laterality: Right;  . INSERTION OF MESH Right 08/23/2015   Procedure: INSERTION OF MESH;  Surgeon: Ralene Ok, MD;  Location: [facility];  Service: General;  Laterality: Right;  . IR THORACENTESIS ASP PLEURAL SPACE W/IMG GUIDE  10/15/2019  . TONSILLECTOMY    . VASECTOMY      Family History  Problem Relation Age of Onset  . Alzheimer's disease Mother   . Dementia Mother   . Coronary artery disease Father   . Heart disease Father   . Coronary  artery disease Other   . Diabetes Other   . Alzheimer's disease Sister   . Dementia Brother   . Alzheimer's disease Brother     Social History   Socioeconomic History  . Marital status: Married    Spouse name: Lovey Newcomer  . Number of children: 3  . Years of education: 23  . Highest education level: Not on file  Occupational History  . Occupation: Therapist, occupational    Comment: Retired  Tobacco Use  . Smoking status: Former Smoker    Packs/day: 1.50    Years: 5.00    Pack years: 7.50    Types: Cigarettes    Start date: 1955    Quit date: 11/25/1958    Years since quitting: 60.9  . Smokeless tobacco: Never Used  Substance and Sexual Activity  . Alcohol use: No  . Drug use: No  . Sexual activity: Yes    Partners: Female  Other Topics Concern  . Not on file  Social History Narrative   [employer]- Estate manager/land agent.    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    Married '64. 3 sons- '66, '68, '71; 5 grandchildren, 1 step g-dtr.       Has living will   Wife is health care POA--then son Mitzi Hansen   Would accept resuscitation attempts but no prolonged ventilatory support   Probably wouldn't want prolonged tube feeds   Social Determinants of Health   Financial Resource Strain:   . Difficulty of Paying Living Expenses: Not on file  Food Insecurity:   . Worried About Charity fundraiser in the Last Year: Not on file  . Ran Out of Food in the Last Year: Not on file  Transportation Needs:   . Lack of Transportation (Medical): Not on file  . Lack of Transportation (Non-Medical): Not on file  Physical Activity:   . Days of Exercise per Week: Not on file  . Minutes of Exercise per Session: Not on file  Stress:   . Feeling of Stress : Not on file  Social Connections:   . Frequency of Communication with Friends and Family: Not on file  . Frequency of Social Gatherings with Friends and Family: Not on file  . Attends Religious Services: Not on file  . Active Member  of Clubs or Organizations: Not on file  . Attends Archivist Meetings: Not on file  . Marital Status: Not on file  Intimate Partner Violence:   . Fear of Current or Ex-Partner: Not on file  . Emotionally Abused: Not on file  . Physically Abused: Not on file  . Sexually Abused: Not on file   Review of Systems  Appetite is fine Lost weight over the past year--- and more in hospital. Gaining this back Sugars up slightly--- in 120's Sleeping okay Some  urinary incontinence--this is new. Just comes out at times     Objective:   Physical Exam  Constitutional: He appears well-developed. No distress.  Neck: No thyromegaly present.  Cardiovascular: Normal rate, regular rhythm and normal heart sounds. Exam reveals no gallop.  No murmur heard. Respiratory: He has no wheezes. He has no rales.  Mild tachypnea Dullness and decreased breath sounds--right base  GI: Soft. There is no abdominal tenderness.  Musculoskeletal:        General: No edema.  Lymphadenopathy:    He has no cervical adenopathy.  Psychiatric: He has a normal mood and affect. His behavior is normal.           Assessment & Plan:

## 2019-10-27 NOTE — Assessment & Plan Note (Signed)
Clear right sided disease with secondary bacterial empyema Is improving but still weak and with respiratory compromise Will finish out the augmentin Recheck CXR--may take months to normalize Discussed slow return to normal activiites

## 2019-10-27 NOTE — Assessment & Plan Note (Signed)
Lab Results  Component Value Date   HGBA1C 7.1 (H) 10/11/2019   Good control still

## 2019-11-09 ENCOUNTER — Ambulatory Visit (INDEPENDENT_AMBULATORY_CARE_PROVIDER_SITE_OTHER)
Admission: RE | Admit: 2019-11-09 | Discharge: 2019-11-09 | Disposition: A | Discharge status: Home / Self Care | Payer: Medicare HMO | Source: Ambulatory Visit | Attending: Internal Medicine | Admitting: Internal Medicine

## 2019-11-09 DIAGNOSIS — J181 Lobar pneumonia, unspecified organism: Secondary | ICD-10-CM

## 2019-11-09 DIAGNOSIS — J189 Pneumonia, unspecified organism: Secondary | ICD-10-CM | POA: Diagnosis not present

## 2019-11-09 DIAGNOSIS — J90 Pleural effusion, not elsewhere classified: Secondary | ICD-10-CM | POA: Diagnosis not present

## 2019-11-24 LAB — ACID FAST CULTURE WITH REFLEXED SENSITIVITIES (MYCOBACTERIA): Acid Fast Culture: NEGATIVE

## 2019-11-29 ENCOUNTER — Ambulatory Visit: Payer: Medicare Other | Attending: Internal Medicine

## 2019-11-29 DIAGNOSIS — Z23 Encounter for immunization: Secondary | ICD-10-CM | POA: Insufficient documentation

## 2019-11-29 NOTE — Progress Notes (Signed)
   Covid-19 Vaccination Clinic  Name:  Frank Carlson    MRN: [id number] DOB: 1941/02/24  11/29/2019  Mr. Braden was observed post Covid-19 immunization for 74 MINS without incidence. He was provided with Vaccine Information Sheet and instruction to access the V-Safe system.   Mr. Krocker was instructed to call 911 with any severe reactions post vaccine: Marland Kitchen Difficulty breathing  . Swelling of your face and throat  . A fast heartbeat  . A bad rash all over your body  . Dizziness and weakness

## 2019-12-20 ENCOUNTER — Ambulatory Visit: Payer: Medicare HMO | Attending: Internal Medicine

## 2019-12-20 DIAGNOSIS — Z23 Encounter for immunization: Secondary | ICD-10-CM | POA: Insufficient documentation

## 2019-12-20 NOTE — Progress Notes (Signed)
   Covid-19 Vaccination Clinic  Name:  Frank Carlson    MRN: [id number] DOB: 01-Aug-1941  12/20/2019  Mr. Frank Carlson was observed post Covid-19 immunization for 15 minutes without incidence. He was provided with Vaccine Information Sheet and instruction to access the V-Safe system.   Mr. Frank Carlson was instructed to call 911 with any severe reactions post vaccine: Marland Kitchen Difficulty breathing  . Swelling of your face and throat  . A fast heartbeat  . A bad rash all over your body  . Dizziness and weakness    Immunizations Administered    Name Date Dose VIS Date Route   Pfizer COVID-19 Vaccine 12/20/2019 10:03 AM 0.3 mL 10/23/2019 Intramuscular   Manufacturer: [place]   Lot: [id number]   [place]: [id number]

## 2020-03-23 ENCOUNTER — Other Ambulatory Visit: Payer: Self-pay | Admitting: Internal Medicine

## 2020-03-23 DIAGNOSIS — E7800 Pure hypercholesterolemia, unspecified: Secondary | ICD-10-CM

## 2020-04-07 ENCOUNTER — Telehealth: Payer: Self-pay | Admitting: Internal Medicine

## 2020-04-07 NOTE — Progress Notes (Signed)
  Chronic Care Management   Outreach Note  04/07/2020 Name: Frank Carlson MRN: [id number] DOB: September 11, 1941  Referred by: Venia Carbon, MD Reason for referral : No chief complaint on file.   An unsuccessful telephone outreach was attempted today. The patient was referred to the pharmacist for assistance with care management and care coordination.   This note is not being shared with the patient for the following reason: To respect privacy (The patient or proxy has requested that the information not be shared).  Follow Up Plan:   SIGNATURE

## 2020-04-08 ENCOUNTER — Telehealth: Payer: Self-pay | Admitting: Internal Medicine

## 2020-04-08 NOTE — Progress Notes (Signed)
  Chronic Care Management   Note  04/08/2020 Name: Frank Carlson MRN: [id number] DOB: 12-14-1940  Frank Carlson is a 79 y.o. year old male who is a primary care patient of Venia Carbon, MD. I reached out to [name] by phone today in response to a referral sent by [name]'s PCP, Venia Carbon, MD.   [name] was given information about Chronic Care Management services today including:  1. CCM service includes personalized support from designated clinical staff supervised by his physician, including individualized plan of care and coordination with other care providers 2. 24/7 contact phone numbers for assistance for urgent and routine care needs. 3. Service will only be billed when office clinical staff spend 20 minutes or more in a month to coordinate care. 4. Only one practitioner may furnish and bill the service in a calendar month. 5. The patient may stop CCM services at any time (effective at the end of the month) by phone call to the office staff.   Patient agreed to services and verbal consent obtained.   This note is not being shared with the patient for the following reason: To respect privacy (The patient or proxy has requested that the information not be shared). Follow up plan:   Earney [place] [employer] Scheduler

## 2020-04-18 DIAGNOSIS — E119 Type 2 diabetes mellitus without complications: Secondary | ICD-10-CM | POA: Diagnosis not present

## 2020-04-18 DIAGNOSIS — H524 Presbyopia: Secondary | ICD-10-CM | POA: Diagnosis not present

## 2020-04-26 IMAGING — DX DG CHEST 2V
3 series · 3 of 3 positions shown · non-contrast
Comparison: 10/15/2019

CLINICAL DATA: Follow-up pneumonia

EXAM:
CHEST - 2 VIEW

[chest pa]
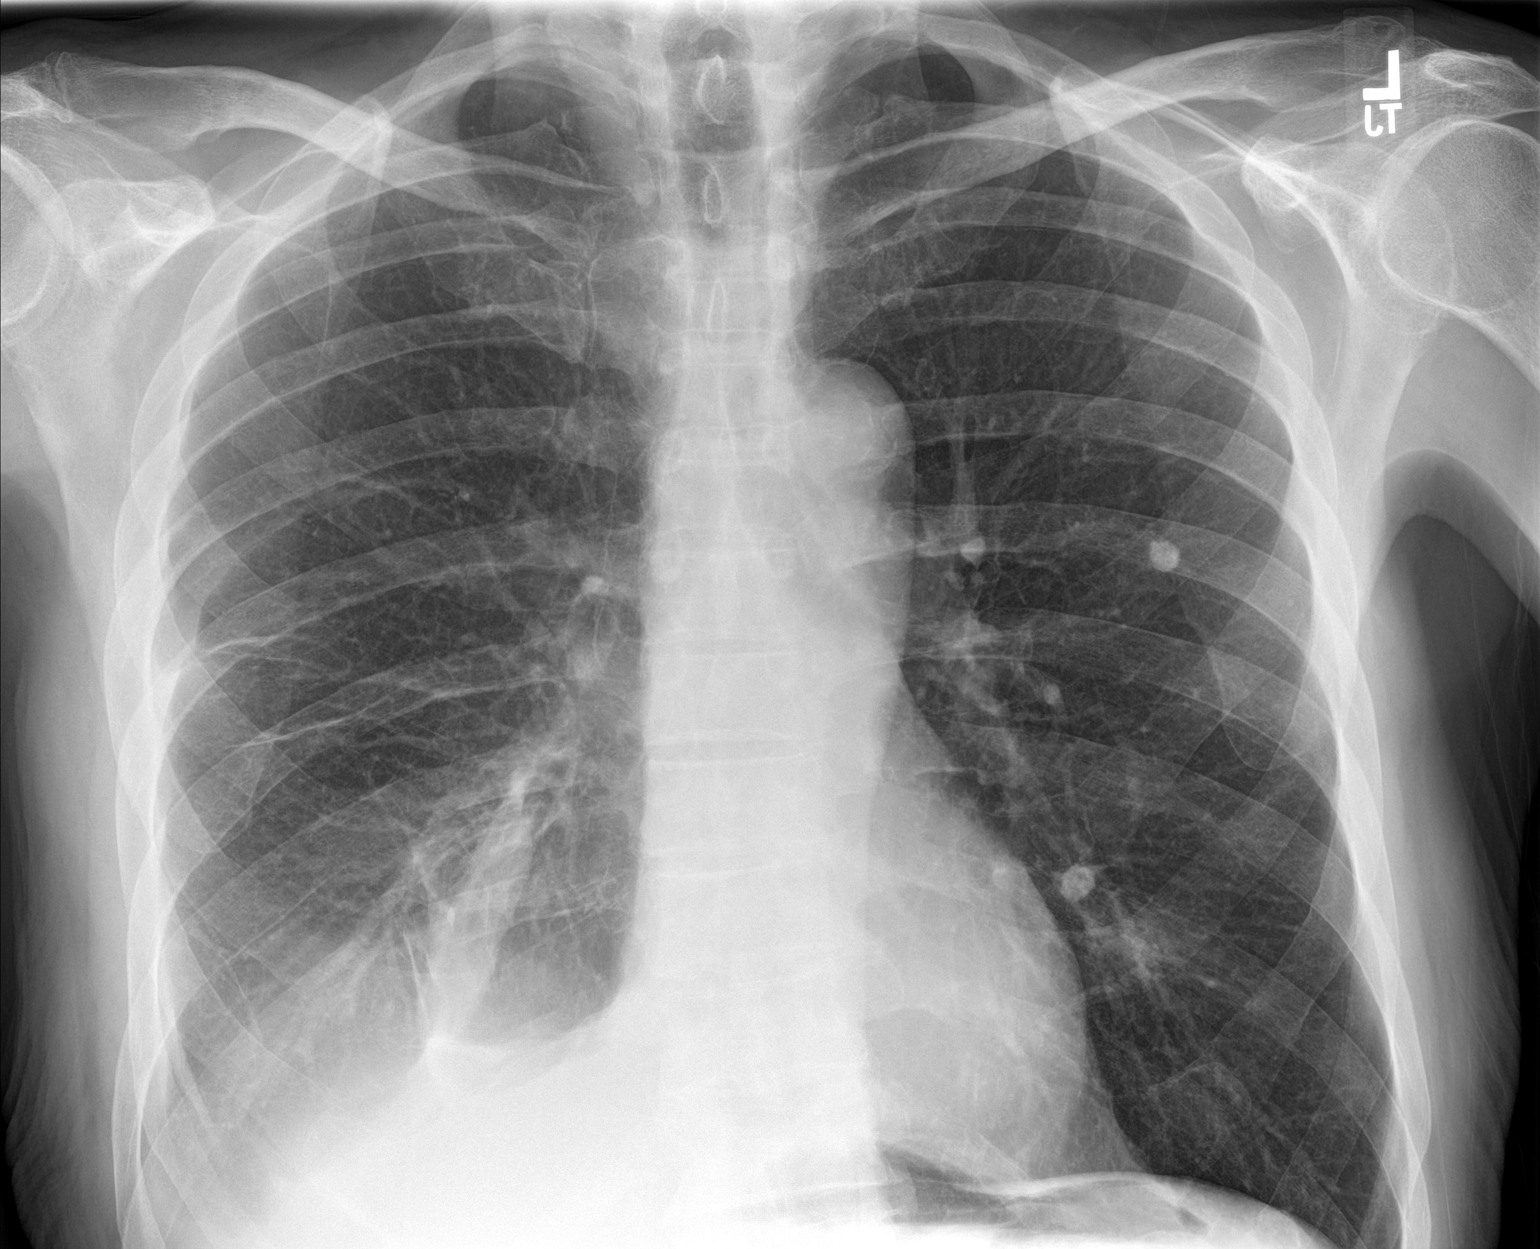

[chest lat]
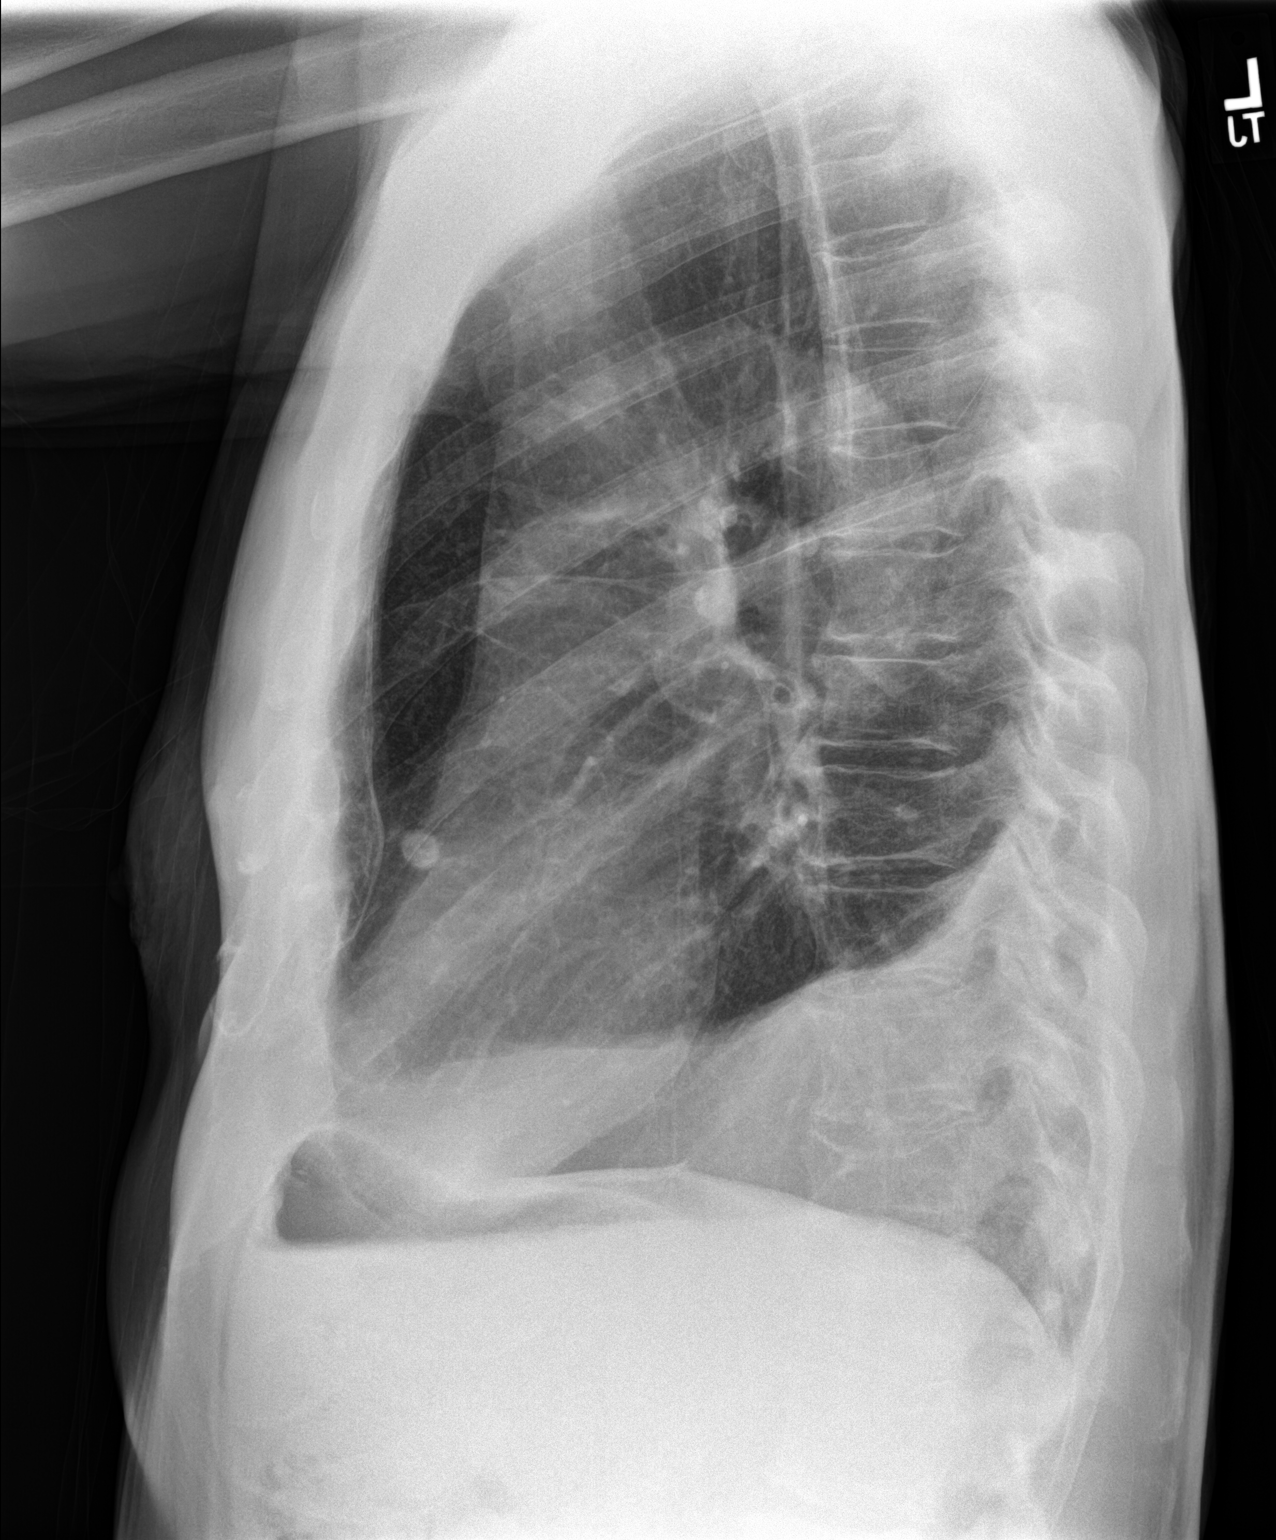

[chest ap]
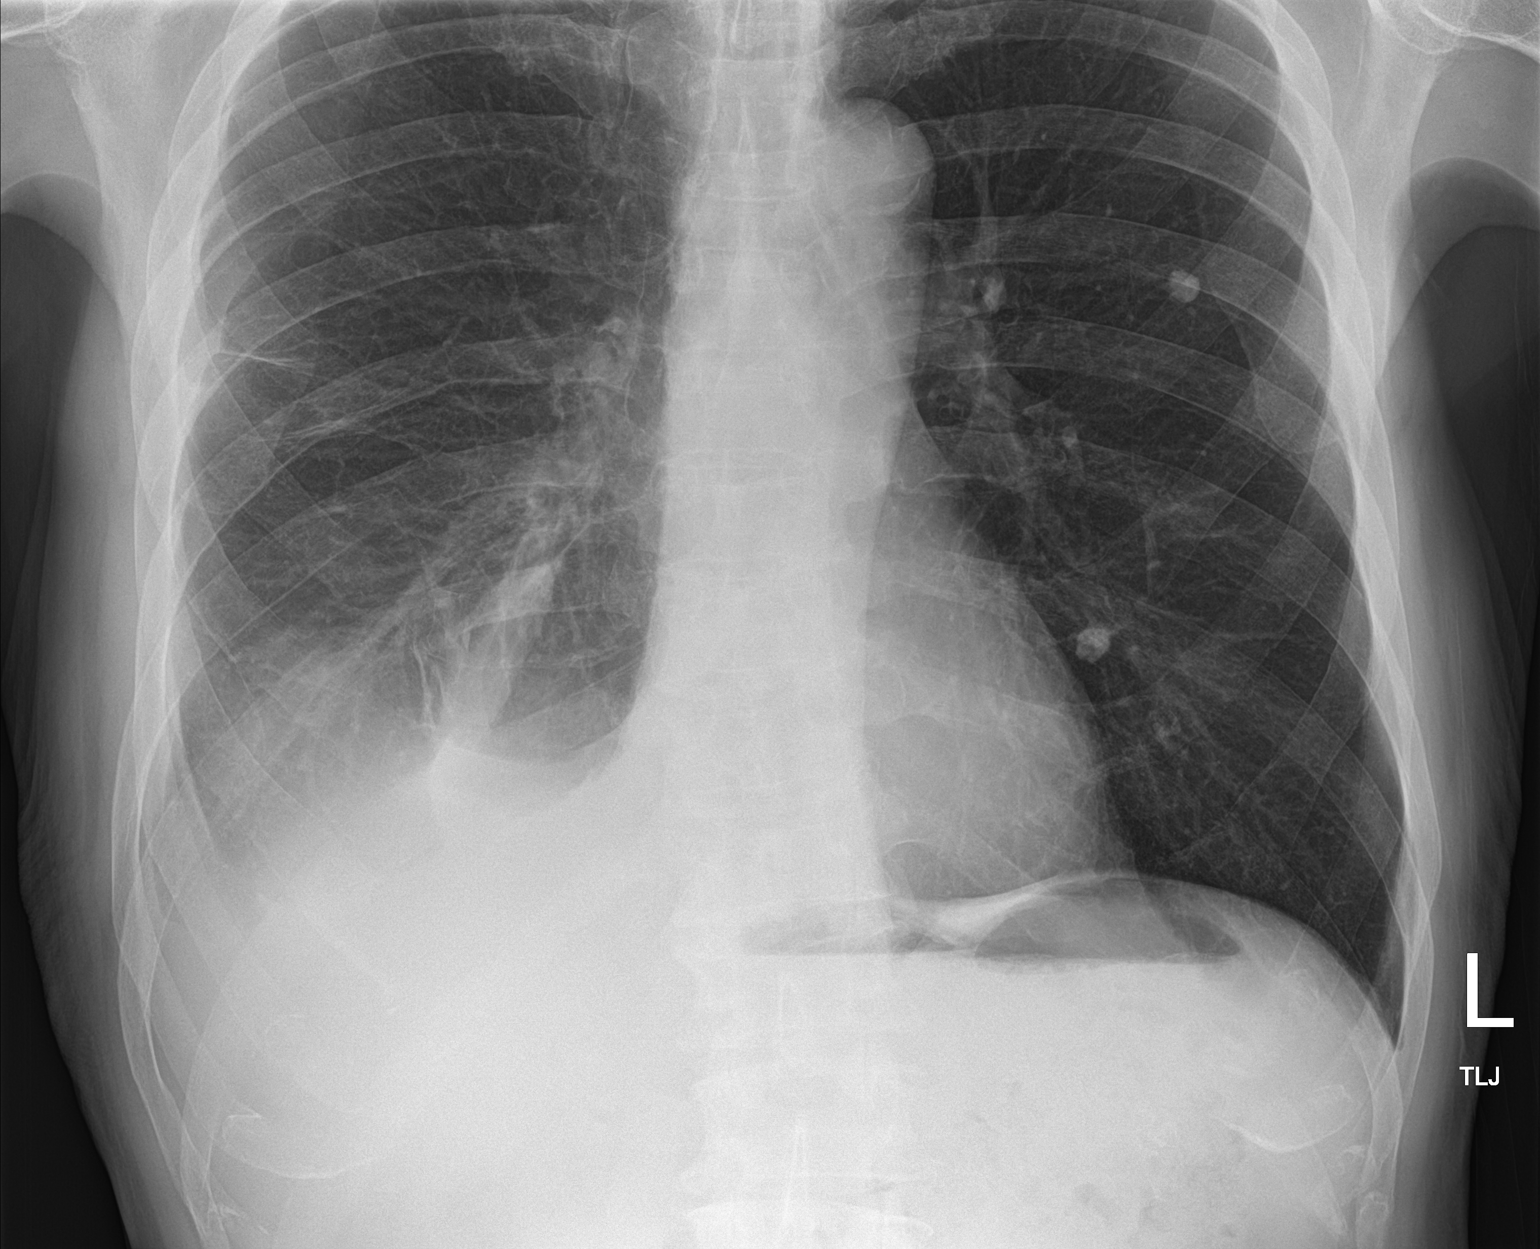

[3 of 3 positions shown; findings below may reference images not displayed]

FINDINGS: Stable cardiomediastinal contours. Interval removal of a right
pleural drainage catheter. Moderate right-sided pleural effusion,
increased from prior. Bandlike opacity in the right lung base may
reflect loculated pleural fluid and or atelectasis. Stable calcified
granulomas within the left lung. Left lung is otherwise clear. No
pneumothorax. Chronic compression deformities of T12 and L1,
unchanged.
IMPRESSION: 1. Moderate right pleural effusion, increased from prior.
2. Bandlike opacity in the right lung base may reflect loculated
pleural fluid and/or atelectasis.
3. No pneumothorax status post right pleural drainage catheter
removal.

## 2020-04-29 ENCOUNTER — Encounter: Payer: Medicare HMO | Admitting: Internal Medicine

## 2020-05-02 NOTE — Progress Notes (Signed)
Hearing Screening   125Hz  250Hz  500Hz  1000Hz  2000Hz  3000Hz  4000Hz  6000Hz  8000Hz   Right ear:           Left ear:           Comments: Done annually, [place]  Vision Screening Comments: Done annually, about 3-4 months ago, [facility]

## 2020-05-03 ENCOUNTER — Ambulatory Visit (INDEPENDENT_AMBULATORY_CARE_PROVIDER_SITE_OTHER): Payer: Medicare HMO | Admitting: Internal Medicine

## 2020-05-03 ENCOUNTER — Other Ambulatory Visit: Payer: Self-pay

## 2020-05-03 ENCOUNTER — Ambulatory Visit (INDEPENDENT_AMBULATORY_CARE_PROVIDER_SITE_OTHER)
Admission: RE | Admit: 2020-05-03 | Discharge: 2020-05-03 | Disposition: A | Discharge status: Home / Self Care | Payer: Medicare HMO | Source: Ambulatory Visit | Attending: Internal Medicine | Admitting: Internal Medicine

## 2020-05-03 ENCOUNTER — Encounter: Payer: Self-pay | Admitting: Internal Medicine

## 2020-05-03 VITALS — BP 112/62 | HR 59 | Temp 97.2°F | Ht 71.5 in | Wt 197.0 lb

## 2020-05-03 DIAGNOSIS — E1142 Type 2 diabetes mellitus with diabetic polyneuropathy: Secondary | ICD-10-CM | POA: Diagnosis not present

## 2020-05-03 DIAGNOSIS — Z0000 Encounter for general adult medical examination without abnormal findings: Secondary | ICD-10-CM | POA: Diagnosis not present

## 2020-05-03 DIAGNOSIS — J90 Pleural effusion, not elsewhere classified: Secondary | ICD-10-CM

## 2020-05-03 DIAGNOSIS — I700 Atherosclerosis of aorta: Secondary | ICD-10-CM | POA: Diagnosis not present

## 2020-05-03 DIAGNOSIS — Z7189 Other specified counseling: Secondary | ICD-10-CM | POA: Diagnosis not present

## 2020-05-03 DIAGNOSIS — N138 Other obstructive and reflux uropathy: Secondary | ICD-10-CM | POA: Diagnosis not present

## 2020-05-03 DIAGNOSIS — N401 Enlarged prostate with lower urinary tract symptoms: Secondary | ICD-10-CM | POA: Diagnosis not present

## 2020-05-03 LAB — COMPREHENSIVE METABOLIC PANEL
ALT: 13 U/L (ref 0–53)
AST: 15 U/L (ref 0–37)
Albumin: 4.2 g/dL (ref 3.5–5.2)
Alkaline Phosphatase: 40 U/L (ref 39–117)
BUN: 13 mg/dL (ref 6–23)
CO2: 33 mEq/L — ABNORMAL HIGH (ref 19–32)
Calcium: 9.4 mg/dL (ref 8.4–10.5)
Chloride: 104 mEq/L (ref 96–112)
Creatinine, Ser: 0.99 mg/dL (ref 0.40–1.50)
GFR: 72.52 mL/min (ref >60.00)
Glucose, Bld: 86 mg/dL (ref 70–99)
Potassium: 4.3 mEq/L (ref 3.5–5.1)
Sodium: 140 mEq/L (ref 135–145)
Total Bilirubin: 0.5 mg/dL (ref 0.2–1.2)
Total Protein: 6.9 g/dL (ref 6.0–8.3)

## 2020-05-03 LAB — CBC
HCT: 39.5 % (ref 39.0–52.0)
Hemoglobin: 13.2 g/dL (ref 13.0–17.0)
MCHC: 33.4 g/dL (ref 30.0–36.0)
MCV: 89.9 fl (ref 78.0–100.0)
Platelets: 150 10*3/uL (ref 150.0–400.0)
RBC: 4.39 Mil/uL (ref 4.22–5.81)
RDW: 14.7 % (ref 11.5–15.5)
WBC: 5 10*3/uL (ref 4.0–10.5)

## 2020-05-03 LAB — MICROALBUMIN / CREATININE URINE RATIO
Creatinine,U: 57.4 mg/dL
Microalb Creat Ratio: 1.2 mg/g (ref 0.0–30.0)
Microalb, Ur: <0.7 mg/dL (ref 0.0–1.9)

## 2020-05-03 LAB — LIPID PANEL
Cholesterol: 126 mg/dL (ref 0–200)
HDL: 29.9 mg/dL — ABNORMAL LOW (ref >39.00)
LDL Cholesterol: 72 mg/dL (ref 0–99)
NonHDL: 95.67
Total CHOL/HDL Ratio: 4
Triglycerides: 117 mg/dL (ref 0.0–149.0)
VLDL: 23.4 mg/dL (ref 0.0–40.0)

## 2020-05-03 LAB — HM DIABETES FOOT EXAM

## 2020-05-03 LAB — HEMOGLOBIN A1C: Hgb A1c MFr Bld: 5.9 % (ref 4.6–6.5)

## 2020-05-03 NOTE — Assessment & Plan Note (Signed)
See social history 

## 2020-05-03 NOTE — Assessment & Plan Note (Signed)
Seen incidentally on CT scan Is on statin

## 2020-05-03 NOTE — Assessment & Plan Note (Signed)
I have personally reviewed the Medicare Annual Wellness questionnaire and have noted 1. The patient's medical and social history 2. Their use of alcohol, tobacco or illicit drugs 3. Their current medications and supplements 4. The patient's functional ability including ADL's, fall risks, home safety risks and hearing or visual             impairment. 5. Diet and physical activities 6. Evidence for depression or mood disorders  The patients weight, height, BMI and visual acuity have been recorded in the chart I have made referrals, counseling and provided education to the patient based review of the above and I have provided the pt with a written personalized care plan for preventive services.  I have provided you with a copy of your personalized plan for preventive services. Please take the time to review along with your updated medication list.  No cancer screening due to age Flu vaccine in the fall Has gotten back to exercise

## 2020-05-03 NOTE — Assessment & Plan Note (Signed)
Seems to still have good control Very mild neuropathy Will check labs

## 2020-05-03 NOTE — Assessment & Plan Note (Signed)
Mild symptoms Not enough for medication

## 2020-05-03 NOTE — Progress Notes (Signed)
Subjective:    Patient ID: Frank Carlson, male    DOB: 1940-12-25, 79 y.o.   MRN: [id number]  HPI Here for Medicare wellness visit and follow up of chronic health conditions This visit occurred during the SARS-CoV-2 public health emergency.  Safety protocols were in place, including screening questions prior to the visit, additional usage of staff PPE, and extensive cleaning of exam room while observing appropriate contact time as indicated for disinfecting solutions.   Reviewed form and advanced directives Reviewed other doctors No alcohol or tobacco Exercises regularly Vision is okay---did have recent dilated eye exam Has hearing aides Did have the fall when sick with injury No depression or anhedonia Independent with instrumental ADLs Memory is slower--but no sig issues  He feels pretty much back to normal No SOB No cough other than rare (mostly sneezes)  Checks sugars every 3 days Usually in 120's---rarely 140's Still on the metformin Some feet tingling and feels cold. No pain or ulcers  Continues on the statin No myalgias or GI problems  Urine stream is okay Does note after void dribbling and need to go back No nocturia  Aortic atherosclerosis found on chest CT in December Is on statin  Current Outpatient Medications on File Prior to Visit  Medication Sig Dispense Refill  . acetaminophen (TYLENOL) 325 MG tablet Take 325 mg by mouth every 6 (six) hours as needed for headache (pain).    . Alcohol Swabs PADS 1 Package by Does not apply route as needed. 100 each 3  . Calcium Carb-Ergocalciferol (CHEWABLE CALCIUM/D PO) Take 1 tablet by mouth at bedtime.     Marland Kitchen glucose blood (TRUE METRIX BLOOD GLUCOSE TEST) test strip Use to test blood sugar once a day. Dx Code: E08.40 (Patient taking differently: Use to test blood sugar twice a month  Dx Code: E08.40) 100 each 3  . metFORMIN (GLUCOPHAGE) 500 MG tablet Take 1 tablet (500 mg total) by mouth 2 (two) times daily with a  meal. 180 tablet 3  . Multiple Vitamin (MULTIVITAMIN WITH MINERALS) TABS tablet Take 1 tablet by mouth every morning.     . Omega-3 Fatty Acids (FISH OIL PO) Take 1 capsule by mouth every morning.     . simvastatin (ZOCOR) 20 MG tablet TAKE 1 TABLET AT BEDTIME 90 tablet 3  . TRUEPLUS LANCETS 30G MISC 1 Units by Does not apply route daily. Use to check blood sugar once a day. Dx Code F62.13 (Patient taking differently: by Does not apply route See admin instructions. Use to check blood sugar twice a month. Dx Code E08.40) 100 each 3   No current facility-administered medications on file prior to visit.    Allergies  Allergen Reactions  . Bee Venom Other (See Comments)    Passed out    Past Medical History:  Diagnosis Date  . BPH (benign prostatic hypertrophy)   . COLONIC POLYPS, HX OF 03/15/2008  . 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 CONCUSSION WITH LOC OF 30 MINUTES OR LESS 11/01/2009  . Diabetes mellitus without complication ([place])    TYPE 2  . ED (erectile dysfunction)   . HEARING LOSS, BILATERAL 11/01/2009   Wears hearing aids  . Hyperlipidemia   . JOINT STIFFNESS, HAND 10/28/2008  . Pleural effusion 10/2019  . Pneumonia   . Type II or unspecified type diabetes mellitus with neurological manifestations, not stated as uncontrolled(250.60) 1/15   Type 2    Past Surgical History:  Procedure Laterality Date  . CATARACT EXTRACTION Bilateral  07/2010   OD with IOL  . COLONOSCOPY W/ POLYPECTOMY    . INCISION / DRAINAGE HAND / FINGER Right   . INGUINAL HERNIA REPAIR Right 08/23/2015   Procedure: LAPAROSCOPIC RIGHT INGUINAL HERNIA REPAIR WITH MESH;  Surgeon: Ralene Ok, MD;  Location: [facility];  Service: General;  Laterality: Right;  . INSERTION OF MESH Right 08/23/2015   Procedure: INSERTION OF MESH;  Surgeon: Ralene Ok, MD;  Location: [facility];  Service: General;  Laterality: Right;  . IR THORACENTESIS ASP PLEURAL SPACE W/IMG GUIDE  10/15/2019  . TONSILLECTOMY    . VASECTOMY       Family History  Problem Relation Age of Onset  . Alzheimer's disease Mother   . Dementia Mother   . Coronary artery disease Father   . Heart disease Father   . Coronary artery disease Other   . Diabetes Other   . Alzheimer's disease Sister   . Dementia Brother   . Alzheimer's disease Brother     Social History   Socioeconomic History  . Marital status: Married    Spouse name: Lovey Newcomer  . Number of children: 3  . Years of education: 90  . Highest education level: Not on file  Occupational History  . Occupation: Therapist, occupational    Comment: Retired  Tobacco Use  . Smoking status: Former Smoker    Packs/day: 1.50    Years: 5.00    Pack years: 7.50    Types: Cigarettes    Start date: 1955    Quit date: 11/25/1958    Years since quitting: 61.4  . Smokeless tobacco: Never Used  Vaping Use  . Vaping Use: Never used  Substance and Sexual Activity  . Alcohol use: No  . Drug use: No  . Sexual activity: Yes    Partners: Female  Other Topics Concern  . Not on file  Social History Narrative   [employer]- Estate manager/land agent.    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    Married '64. 3 sons- '66, '68, '71; 5 grandchildren, 1 step g-dtr.       Has living will   Wife is health care POA--then son Mitzi Hansen   Would accept resuscitation attempts but no prolonged ventilatory support   Probably wouldn't want prolonged tube feeds   Social Determinants of Health   Financial Resource Strain:   . Difficulty of Paying Living Expenses:   Food Insecurity:   . Worried About Charity fundraiser in the Last Year:   . Arboriculturist in the Last Year:   Transportation Needs:   . Film/video editor (Medical):   Marland Kitchen Lack of Transportation (Non-Medical):   Physical Activity:   . Days of Exercise per Week:   . Minutes of Exercise per Session:   Stress:   . Feeling of Stress :   Social Connections:   . Frequency of Communication with Friends and Family:   .  Frequency of Social Gatherings with Friends and Family:   . Attends Religious Services:   . Active Member of Clubs or Organizations:   . Attends Archivist Meetings:   Marland Kitchen Marital Status:   Intimate Partner Violence:   . Fear of Current or Ex-Partner:   . Emotionally Abused:   Marland Kitchen Physically Abused:   . Sexually Abused:    Review of Systems Appetite is good Has regained the weight he lost with the illness Sleeps well Wears seat belt Teeth okay--keeps up with dentist No rash or suspicious  skin lesions No chest pain or palpitations No dizziness or syncope No edema No heartburn or dysphagia Bowels are fine---no blood No sig back or joint pains    Objective:   Physical Exam  Constitutional: He is oriented to person, place, and time. No distress.  HENT:  Head: Normocephalic and atraumatic.  Mouth/Throat: Mucous membranes are moist.  Cardiovascular: Normal rate, regular rhythm and normal pulses. Exam reveals no gallop.  No murmur heard. Respiratory: Effort normal and breath sounds normal. He has no wheezes. He has no rales.  GI: Soft. Normal appearance. There is no abdominal tenderness.  Musculoskeletal:     Cervical back: Neck supple.     Right lower leg: No edema.     Left lower leg: No edema.  Lymphadenopathy:    He has no cervical adenopathy.  Neurological: He is alert and oriented to person, place, and time.  President---"Biden, Trump, Obama" [id number] Recall 1/3  Normal fine touch sensation in feet  Skin: No rash noted.  No foot lesions  Psychiatric: His behavior is normal. Mood normal.           Assessment & Plan:

## 2020-05-03 NOTE — Assessment & Plan Note (Signed)
From past pneumonia Will recheck CXR to see new baseline

## 2020-06-17 ENCOUNTER — Telehealth: Payer: Self-pay | Admitting: Internal Medicine

## 2020-06-17 NOTE — Progress Notes (Signed)
  Chronic Care Management   Note  06/17/2020 Name: Frank Carlson MRN: [id number] DOB: 05-16-1941  Frank Carlson is a 79 y.o. year old male who is a primary care patient of Venia Carbon, MD. I reached out to [name] by phone today in response to a referral sent by Frank Carlson's PCP, Venia Carbon, MD.   [name] was given information about Chronic Care Management services today including:  1. CCM service includes personalized support from designated clinical staff supervised by his physician, including individualized plan of care and coordination with other care providers 2. 24/7 contact phone numbers for assistance for urgent and routine care needs. 3. Service will only be billed when office clinical staff spend 20 minutes or more in a month to coordinate care. 4. Only one practitioner may furnish and bill the service in a calendar month. 5. The patient may stop CCM services at any time (effective at the end of the month) by phone call to the office staff.   Patient agreed to services and verbal consent obtained.   Follow up plan:   Carley Perdue [employer] Scheduler

## 2020-06-20 ENCOUNTER — Telehealth: Payer: Self-pay | Admitting: Internal Medicine

## 2020-06-20 NOTE — Progress Notes (Signed)
   Chronic Care Management   Note  06/20/2020 Name: Frank Carlson MRN: [id number] DOB: Feb 10, 1941  Frank Carlson is a 79 y.o. year old male who is a primary care patient of Venia Carbon, MD. I reached out to Frank Carlson by phone today in response to a referral sent by Frank Carlson's PCP, Venia Carbon, MD.   Frank Carlson was given information about Chronic Care Management services today including:  1. CCM service includes personalized support from designated clinical staff supervised by his physician, including individualized plan of care and coordination with other care providers 2. 24/7 contact phone numbers for assistance for urgent and routine care needs. 3. Service will only be billed when office clinical staff spend 20 minutes or more in a month to coordinate care. 4. Only one practitioner may furnish and bill the service in a calendar month. 5. The patient may stop CCM services at any time (effective at the end of the month) by phone call to the office staff.   Patient agreed to services and verbal consent obtained.   Follow up plan:   Carley Perdue [employer] Scheduler

## 2020-06-23 ENCOUNTER — Telehealth: Payer: Medicare HMO

## 2020-06-26 ENCOUNTER — Emergency Department (HOSPITAL_COMMUNITY)
Admission: EM | Admit: 2020-06-26 | Discharge: 2020-06-26 | Disposition: A | Discharge status: Home / Self Care | Payer: Medicare HMO | Attending: Emergency Medicine | Admitting: Emergency Medicine

## 2020-06-26 ENCOUNTER — Other Ambulatory Visit: Payer: Self-pay

## 2020-06-26 ENCOUNTER — Encounter (HOSPITAL_COMMUNITY): Payer: Self-pay | Admitting: Emergency Medicine

## 2020-06-26 DIAGNOSIS — R1110 Vomiting, unspecified: Secondary | ICD-10-CM | POA: Diagnosis not present

## 2020-06-26 DIAGNOSIS — R1013 Epigastric pain: Secondary | ICD-10-CM | POA: Insufficient documentation

## 2020-06-26 DIAGNOSIS — Z5321 Procedure and treatment not carried out due to patient leaving prior to being seen by health care provider: Secondary | ICD-10-CM | POA: Insufficient documentation

## 2020-06-26 LAB — COMPREHENSIVE METABOLIC PANEL
ALT: 145 U/L — ABNORMAL HIGH (ref 0–44)
AST: 280 U/L — ABNORMAL HIGH (ref 15–41)
Albumin: 4 g/dL (ref 3.5–5.0)
Alkaline Phosphatase: 50 U/L (ref 38–126)
Anion gap: 10 (ref 5–15)
BUN: 13 mg/dL (ref 8–23)
CO2: 29 mmol/L (ref 22–32)
Calcium: 9.8 mg/dL (ref 8.9–10.3)
Chloride: 102 mmol/L (ref 98–111)
Creatinine, Ser: 1.18 mg/dL (ref 0.61–1.24)
GFR calc Af Amer: >60 mL/min (ref >60)
GFR calc non Af Amer: 58 mL/min — ABNORMAL LOW (ref >60)
Glucose, Bld: 194 mg/dL — ABNORMAL HIGH (ref 70–99)
Potassium: 4.3 mmol/L (ref 3.5–5.1)
Sodium: 141 mmol/L (ref 135–145)
Total Bilirubin: 1.2 mg/dL (ref 0.3–1.2)
Total Protein: 7.7 g/dL (ref 6.5–8.1)

## 2020-06-26 LAB — TROPONIN I (HIGH SENSITIVITY)
Troponin I (High Sensitivity): 9 ng/L (ref <18)
Troponin I (High Sensitivity): 7 ng/L (ref <18)

## 2020-06-26 LAB — LIPASE, BLOOD: Lipase: 3639 U/L — ABNORMAL HIGH (ref 11–51)

## 2020-06-26 LAB — CBC
HCT: 44.6 % (ref 39.0–52.0)
Hemoglobin: 14.2 g/dL (ref 13.0–17.0)
MCH: 28.9 pg (ref 26.0–34.0)
MCHC: 31.8 g/dL (ref 30.0–36.0)
MCV: 90.7 fL (ref 80.0–100.0)
Platelets: 180 10*3/uL (ref 150–400)
RBC: 4.92 MIL/uL (ref 4.22–5.81)
RDW: 13.4 % (ref 11.5–15.5)
WBC: 16 10*3/uL — ABNORMAL HIGH (ref 4.0–10.5)
nRBC: 0 % (ref 0.0–0.2)

## 2020-06-26 NOTE — ED Notes (Signed)
Called pt name to be roomed x3. No response from pt.  

## 2020-06-26 NOTE — ED Triage Notes (Signed)
Pt presents to ED POV. PT c/o epigastric pain that began around 0200. PT reports taht he attempted to take antiacid a/ no relief. Emesis x1.

## 2020-06-27 ENCOUNTER — Encounter (HOSPITAL_COMMUNITY): Payer: Self-pay

## 2020-06-27 ENCOUNTER — Telehealth: Payer: Self-pay

## 2020-06-27 ENCOUNTER — Emergency Department (HOSPITAL_COMMUNITY)
Admission: EM | Admit: 2020-06-27 | Discharge: 2020-06-28 | Disposition: A | Discharge status: Home / Self Care | Payer: Medicare HMO | Attending: Emergency Medicine | Admitting: Emergency Medicine

## 2020-06-27 DIAGNOSIS — R296 Repeated falls: Secondary | ICD-10-CM | POA: Diagnosis not present

## 2020-06-27 DIAGNOSIS — R509 Fever, unspecified: Secondary | ICD-10-CM | POA: Diagnosis not present

## 2020-06-27 DIAGNOSIS — G930 Cerebral cysts: Secondary | ICD-10-CM | POA: Diagnosis not present

## 2020-06-27 DIAGNOSIS — Z7984 Long term (current) use of oral hypoglycemic drugs: Secondary | ICD-10-CM | POA: Diagnosis not present

## 2020-06-27 DIAGNOSIS — R42 Dizziness and giddiness: Secondary | ICD-10-CM | POA: Diagnosis not present

## 2020-06-27 DIAGNOSIS — Z20822 Contact with and (suspected) exposure to covid-19: Secondary | ICD-10-CM | POA: Diagnosis not present

## 2020-06-27 DIAGNOSIS — R7989 Other specified abnormal findings of blood chemistry: Secondary | ICD-10-CM

## 2020-06-27 DIAGNOSIS — Z87891 Personal history of nicotine dependence: Secondary | ICD-10-CM | POA: Insufficient documentation

## 2020-06-27 DIAGNOSIS — R1011 Right upper quadrant pain: Secondary | ICD-10-CM | POA: Insufficient documentation

## 2020-06-27 DIAGNOSIS — E119 Type 2 diabetes mellitus without complications: Secondary | ICD-10-CM | POA: Diagnosis not present

## 2020-06-27 DIAGNOSIS — Z79899 Other long term (current) drug therapy: Secondary | ICD-10-CM | POA: Diagnosis not present

## 2020-06-27 DIAGNOSIS — G319 Degenerative disease of nervous system, unspecified: Secondary | ICD-10-CM | POA: Diagnosis not present

## 2020-06-27 DIAGNOSIS — K8020 Calculus of gallbladder without cholecystitis without obstruction: Secondary | ICD-10-CM | POA: Diagnosis not present

## 2020-06-27 DIAGNOSIS — R945 Abnormal results of liver function studies: Secondary | ICD-10-CM | POA: Diagnosis not present

## 2020-06-27 DIAGNOSIS — R05 Cough: Secondary | ICD-10-CM | POA: Diagnosis not present

## 2020-06-27 DIAGNOSIS — R531 Weakness: Secondary | ICD-10-CM | POA: Diagnosis not present

## 2020-06-27 DIAGNOSIS — R9431 Abnormal electrocardiogram [ECG] [EKG]: Secondary | ICD-10-CM | POA: Diagnosis not present

## 2020-06-27 LAB — URINALYSIS, ROUTINE W REFLEX MICROSCOPIC
Bilirubin Urine: NEGATIVE
Glucose, UA: NEGATIVE mg/dL
Hgb urine dipstick: NEGATIVE
Ketones, ur: NEGATIVE mg/dL
Leukocytes,Ua: NEGATIVE
Nitrite: NEGATIVE
Protein, ur: 100 mg/dL — AB
Specific Gravity, Urine: 1.023 (ref 1.005–1.030)
pH: 7 (ref 5.0–8.0)

## 2020-06-27 LAB — COMPREHENSIVE METABOLIC PANEL
ALT: 465 U/L — ABNORMAL HIGH (ref 0–44)
AST: 260 U/L — ABNORMAL HIGH (ref 15–41)
Albumin: 3.5 g/dL (ref 3.5–5.0)
Alkaline Phosphatase: 65 U/L (ref 38–126)
Anion gap: 10 (ref 5–15)
BUN: 19 mg/dL (ref 8–23)
CO2: 24 mmol/L (ref 22–32)
Calcium: 8.8 mg/dL — ABNORMAL LOW (ref 8.9–10.3)
Chloride: 101 mmol/L (ref 98–111)
Creatinine, Ser: 1.19 mg/dL (ref 0.61–1.24)
GFR calc Af Amer: >60 mL/min (ref >60)
GFR calc non Af Amer: 57 mL/min — ABNORMAL LOW (ref >60)
Glucose, Bld: 146 mg/dL — ABNORMAL HIGH (ref 70–99)
Potassium: 3.7 mmol/L (ref 3.5–5.1)
Sodium: 135 mmol/L (ref 135–145)
Total Bilirubin: 1.2 mg/dL (ref 0.3–1.2)
Total Protein: 6.9 g/dL (ref 6.5–8.1)

## 2020-06-27 LAB — CBC WITH DIFFERENTIAL/PLATELET
Abs Immature Granulocytes: 0.03 10*3/uL (ref 0.00–0.07)
Basophils Absolute: 0 10*3/uL (ref 0.0–0.1)
Basophils Relative: 0 %
Eosinophils Absolute: 0 10*3/uL (ref 0.0–0.5)
Eosinophils Relative: 0 %
HCT: 38.8 % — ABNORMAL LOW (ref 39.0–52.0)
Hemoglobin: 12.9 g/dL — ABNORMAL LOW (ref 13.0–17.0)
Immature Granulocytes: 0 %
Lymphocytes Relative: 5 %
Lymphs Abs: 0.4 10*3/uL — ABNORMAL LOW (ref 0.7–4.0)
MCH: 29.7 pg (ref 26.0–34.0)
MCHC: 33.2 g/dL (ref 30.0–36.0)
MCV: 89.2 fL (ref 80.0–100.0)
Monocytes Absolute: 0.7 10*3/uL (ref 0.1–1.0)
Monocytes Relative: 8 %
Neutro Abs: 7.7 10*3/uL (ref 1.7–7.7)
Neutrophils Relative %: 87 %
Platelets: DECREASED 10*3/uL (ref 150–400)
RBC: 4.35 MIL/uL (ref 4.22–5.81)
RDW: 13.3 % (ref 11.5–15.5)
WBC: 8.8 10*3/uL (ref 4.0–10.5)
nRBC: 0 % (ref 0.0–0.2)

## 2020-06-27 LAB — LACTIC ACID, PLASMA: Lactic Acid, Venous: 1.1 mmol/L (ref 0.5–1.9)

## 2020-06-27 NOTE — Telephone Encounter (Signed)
[facility] Day - Client TELEPHONE ADVICE RECORD [facility] Patient Name: Frank Carlson Gender: Male DOB: [birth year] Age: 79 Y 3 M 29 D Return Phone Number: [phone] (Primary) Address: City/State/Zip: [place] [address] Client [facility] - Client Client Site [facility] - Day Physician Viviana Simpler- MD Contact Type Call Who Is Calling Patient / Member / Family / Caregiver Call Type Triage / Clinical Caller Name Katharine Look Relationship To Patient Spouse Return Phone Number [phone] (Primary) Chief Complaint Dizziness Reason for Call Symptomatic / Request for [place] states is w/Answering Service - Her husband fell this morning and was feeling off balance. EMT saw the pt this morning, blood sugar was 250 but he refuse to go to the ER. Translation No Nurse Assessment Nurse: Zenia Resides, RN, Diane Date/Time Eilene Ghazi Time): 06/27/2020 2:44:19 PM Confirm and document reason for call. If symptomatic, describe symptoms. ---Caller states pt. fell yesterday- went to ER and got tired of waiting and came home. Pt. has fallen again. Called EMT this morning. BS was 250 this am. Pt. has a hx of DM. Pt. can't seem to get his balance. No chest pain or sob. Just very weak. Saturday night had some stomach problems- vomited x 1. Has the patient had close contact with a person known or suspected to have the novel coronavirus illness OR traveled / lives in area with major community spread (including international travel) in the last 14 days from the onset of symptoms? * If Asymptomatic, screen for exposure and travel within the last 14 days. ---No Does the patient have any new or worsening symptoms? ---Yes Will a triage be completed? ---Yes Related visit to physician within the last 2 weeks? ---Yes Does the PT have any chronic conditions? (i.e. diabetes, asthma, this includes High risk  factors for pregnancy, etc.) ---Yes List chronic conditions. ---DM Is this a behavioral health or substance abuse call? ---No Guidelines Guideline Title Affirmed Question Affirmed Notes Nurse Date/Time (Eastern Time) Falls and Falling [1] SEVERE weakness (i.e., unable to walk Zenia Resides, Therapist, sports, Diane 06/27/2020 2:48:11 PM PLEASE NOTE: All timestamps contained within this report are represented as [place] Standard Time. CONFIDENTIALTY NOTICE: This fax transmission is intended only for the addressee. It contains information that is legally privileged, confidential or otherwise protected from use or disclosure. If you are not the intended recipient, you are strictly prohibited from reviewing, disclosing, copying using or disseminating any of this information or taking any action in reliance on or regarding this information. If you have received this fax in error, please notify us immediately by telephone so that we can arrange for its return to [place]. Phone: [phone], Toll-Free: [phone], Fax: [phone] Page: 2 of 2 Call Id: [id number] Guidelines Guideline Title Affirmed Question Affirmed Notes Nurse Date/Time Eilene Ghazi Time) or barely able to walk, requires support) AND [2] new-onset or worsening Disp. Time Eilene Ghazi Time) Disposition Final User 06/27/2020 2:52:37 PM 911 Outcome Documentation Zenia Resides, RN, Diane Reason: Wife was calling 911 now- she had already planned to as pt. had fallen gain between when she called and when we had called back 06/27/2020 2:49:06 PM Call EMS 911 Now Yes Zenia Resides, RN, Diane Caller Disagree/Comply Comply Caller Understands Yes PreDisposition 911 Care Advice Given Per Guideline CALL EMS 911 NOW: CARE ADVICE given per Fall and Falling (Adult) guideline.

## 2020-06-27 NOTE — Telephone Encounter (Signed)
Faythe Ghee It seems like he does need evaluation but I know that wait is very long in the ER. I will see what happens this time

## 2020-06-27 NOTE — ED Triage Notes (Signed)
Pt from home with ems for generalized weakness and fever. Pt had 2 falls at home within the last 24 hrs due to leg weakness. Pt temp 102 initially with EMS, 1g tylenol given en route temp now 100.2. pt a.o, VSS, nad noted.

## 2020-06-27 NOTE — Telephone Encounter (Signed)
I spoke with pts wife and pt is going by EMS now to [facility] ED. Per pts wife pt has fallen several times today. FYI to Dr Silvio Pate.

## 2020-06-28 ENCOUNTER — Emergency Department (HOSPITAL_COMMUNITY): Payer: Medicare HMO

## 2020-06-28 DIAGNOSIS — K8020 Calculus of gallbladder without cholecystitis without obstruction: Secondary | ICD-10-CM | POA: Diagnosis not present

## 2020-06-28 DIAGNOSIS — G319 Degenerative disease of nervous system, unspecified: Secondary | ICD-10-CM | POA: Diagnosis not present

## 2020-06-28 DIAGNOSIS — R509 Fever, unspecified: Secondary | ICD-10-CM | POA: Diagnosis not present

## 2020-06-28 DIAGNOSIS — G930 Cerebral cysts: Secondary | ICD-10-CM | POA: Diagnosis not present

## 2020-06-28 DIAGNOSIS — R296 Repeated falls: Secondary | ICD-10-CM | POA: Diagnosis not present

## 2020-06-28 DIAGNOSIS — R945 Abnormal results of liver function studies: Secondary | ICD-10-CM | POA: Diagnosis not present

## 2020-06-28 LAB — SARS CORONAVIRUS 2 BY RT PCR (HOSPITAL ORDER, PERFORMED IN ~~LOC~~ HOSPITAL LAB): SARS Coronavirus 2: NEGATIVE

## 2020-06-28 NOTE — ED Provider Notes (Signed)
[facility] EMERGENCY DEPARTMENT Provider Note   CSN: [id number] Arrival date & time: 06/27/20  1624     History Chief Complaint  Patient presents with  . Weakness  . Fever    Frank Carlson is a 79 y.o. male.  Patient with history of diabetes, pneumonia complicated by pleural effusion -- presents to the emergency department for falls and weakness.  Patient reports feeling off balance over the past 2 weeks.  He has had multiple falls at home.  He states that when walking at home recently he has been "bouncing off the walls".  Patient states that this typically occurs when he stands up from his living room chair.  Last night he stood up and fell prompting his wife to call the ambulance.  Upon ambulance arrival, patient was found to have a temperature of 102 F.  He does not report fever prior to this point.  He was given a gram of Tylenol with improvement in his temperature.  In regards to infection, patient denies ear pain, sore throat.  He has had some nasal congestion and occasional minor cough.  No chest pain or shortness of breath.  No abdominal pain, nausea, vomiting, diarrhea.  No urinary symptoms or skin rashes.  Denies leg swelling or tick bites.  In regards to stroke symptoms, hedenies signs of stroke including: facial droop, slurred speech, aphasia, weakness/numbness in extremities.         Past Medical History:  Diagnosis Date  . BPH (benign prostatic hypertrophy)   . COLONIC POLYPS, HX OF 03/15/2008  . 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 CONCUSSION WITH LOC OF 30 MINUTES OR LESS 11/01/2009  . Diabetes mellitus without complication ([place])    TYPE 2  . ED (erectile dysfunction)   . HEARING LOSS, BILATERAL 11/01/2009   Wears hearing aids  . Hyperlipidemia   . JOINT STIFFNESS, HAND 10/28/2008  . Pleural effusion 10/2019  . Pneumonia   . Type II or unspecified type diabetes mellitus with neurological manifestations, not stated as uncontrolled(250.60) 1/15    Type 2    Patient Active Problem List   Diagnosis Date Noted  . Aortic atherosclerosis ([place]) 05/03/2020  . Loculated pleural effusion 10/10/2019  . Anemia 10/10/2019  . Seborrheic keratosis 12/03/2016  . BPH with obstruction/lower urinary tract symptoms 04/03/2016  . Advance directive discussed with patient 03/28/2015  . Hyperlipidemia   . Diabetes mellitus with neurological manifestations, controlled ([place]) 12/08/2013  . Routine health maintenance 11/26/2011  . 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 Presbycusis of both ears 11/01/2009  . COLONIC POLYPS, HX OF 03/15/2008    Past Surgical History:  Procedure Laterality Date  . CATARACT EXTRACTION Bilateral 07/2010   OD with IOL  . COLONOSCOPY W/ POLYPECTOMY    . INCISION / DRAINAGE HAND / FINGER Right   . INGUINAL HERNIA REPAIR Right 08/23/2015   Procedure: LAPAROSCOPIC RIGHT INGUINAL HERNIA REPAIR WITH MESH;  Surgeon: Ralene Ok, MD;  Location: [facility];  Service: General;  Laterality: Right;  . INSERTION OF MESH Right 08/23/2015   Procedure: INSERTION OF MESH;  Surgeon: Ralene Ok, MD;  Location: [facility];  Service: General;  Laterality: Right;  . IR THORACENTESIS ASP PLEURAL SPACE W/IMG GUIDE  10/15/2019  . TONSILLECTOMY    . VASECTOMY         Family History  Problem Relation Age of Onset  . Alzheimer's disease Mother   . Dementia Mother   . Coronary artery disease Father   . Heart disease Father   .  Coronary artery disease Other   . Diabetes Other   . Alzheimer's disease Sister   . Dementia Brother   . Alzheimer's disease Brother     Social History   Tobacco Use  . Smoking status: Former Smoker    Packs/day: 1.50    Years: 5.00    Pack years: 7.50    Types: Cigarettes    Start date: 1955    Quit date: 11/25/1958    Years since quitting: 61.6  . Smokeless tobacco: Never Used  Vaping Use  . Vaping Use: Never used  Substance Use Topics  . Alcohol use: No  . Drug use: No    Home Medications Prior to  Admission medications   Medication Sig Start Date End Date Taking? Authorizing Provider  Calcium Carb-Ergocalciferol (CHEWABLE CALCIUM/D PO) Take 1 tablet by mouth at bedtime.    Yes [provider]  glucose blood (TRUE METRIX BLOOD GLUCOSE TEST) test strip Use to test blood sugar once a day. Dx Code: B28.41 Patient taking differently: 1 each by Other route every 14 (fourteen) days. Use to test blood sugar twice a month  Dx Code: E08.40 12/03/16  Yes Venia Carbon, MD  metFORMIN (GLUCOPHAGE) 500 MG tablet Take 1 tablet (500 mg total) by mouth 2 (two) times daily with a meal. 10/05/19  Yes Venia Carbon, MD  Multiple Vitamin (MULTIVITAMIN WITH MINERALS) TABS tablet Take 1 tablet by mouth every morning.    Yes [provider]  Omega-3 Fatty Acids (FISH OIL PO) Take 1 capsule by mouth every morning.    Yes [provider]  simvastatin (ZOCOR) 20 MG tablet TAKE 1 TABLET AT BEDTIME Patient taking differently: Take 20 mg by mouth at bedtime.  03/24/20  Yes Venia Carbon, MD  TRUEPLUS LANCETS 30G MISC 1 Units by Does not apply route daily. Use to check blood sugar once a day. Dx Code L24.40 Patient taking differently: by Does not apply route See admin instructions. Use to check blood sugar twice a month. Dx Code N02.72 12/03/16  Yes Venia Carbon, MD  Alcohol Swabs PADS 1 Package by Does not apply route as needed. Patient not taking: Reported on 06/28/2020 12/03/16   Venia Carbon, MD    Allergies    Bee venom  Review of Systems   Review of Systems  Constitutional: Positive for fever. Negative for chills.  HENT: Positive for congestion. Negative for rhinorrhea and sore throat.   Eyes: Negative for redness.  Respiratory: Positive for cough. Negative for shortness of breath.   Cardiovascular: Negative for chest pain.  Gastrointestinal: Negative for abdominal pain, diarrhea, nausea and vomiting.  Genitourinary: Negative for dysuria and hematuria.    Musculoskeletal: Positive for gait problem. Negative for myalgias.  Skin: Negative for rash.  Neurological: Negative for headaches.    Physical Exam Updated Vital Signs BP 115/73 (BP Location: Right Arm)   Pulse (!) 58   Temp 97.9 F (36.6 C) (Oral)   Resp 18   Ht 6' (1.829 m)   Wt 77.1 kg   SpO2 98%   BMI 23.06 kg/m   Physical Exam Vitals and nursing note reviewed.  Constitutional:      Appearance: He is well-developed.  HENT:     Head: Normocephalic and atraumatic.     Right Ear: Tympanic membrane, ear canal and external ear normal.     Left Ear: Tympanic membrane, ear canal and external ear normal.     Nose: Nose normal.  Mouth/Throat:     Pharynx: Uvula midline.  Eyes:     General: Lids are normal.        Right eye: No discharge.        Left eye: No discharge.     Conjunctiva/sclera: Conjunctivae normal.     Pupils: Pupils are equal, round, and reactive to light.  Cardiovascular:     Rate and Rhythm: Normal rate and regular rhythm.     Heart sounds: Normal heart sounds.  Pulmonary:     Effort: Pulmonary effort is normal.     Breath sounds: Normal breath sounds.  Abdominal:     Palpations: Abdomen is soft.     Tenderness: There is no abdominal tenderness. There is no guarding or rebound.     Comments: No RUQ pain or abdominal tenderness on exam.   Musculoskeletal:        General: No swelling or tenderness. Normal range of motion.     Cervical back: Normal range of motion and neck supple. No tenderness or bony tenderness.  Skin:    General: Skin is warm and dry.  Neurological:     Mental Status: He is alert and oriented to person, place, and time.     GCS: GCS eye subscore is 4. GCS verbal subscore is 5. GCS motor subscore is 6.     Cranial Nerves: No cranial nerve deficit.     Sensory: No sensory deficit.     Motor: No weakness or abnormal muscle tone.     Coordination: Coordination is intact. Coordination normal. Finger-Nose-Finger Test normal.      Gait: Gait normal.     Comments: Patient able to stand from sitting, with some dysequilibrium initially after standing-- but ultimately able to walk several steps without assistance.     ED Results / Procedures / Treatments   Labs (all labs ordered are listed, but only abnormal results are displayed) Labs Reviewed  COMPREHENSIVE METABOLIC PANEL - Abnormal; Notable for the following components:      Result Value   Glucose, Bld 146 (*)    Calcium 8.8 (*)    AST 260 (*)    ALT 465 (*)    GFR calc non Af Amer 57 (*)    All other components within normal limits  CBC WITH DIFFERENTIAL/PLATELET - Abnormal; Notable for the following components:   Hemoglobin 12.9 (*)    HCT 38.8 (*)    Lymphs Abs 0.4 (*)    All other components within normal limits  URINALYSIS, ROUTINE W REFLEX MICROSCOPIC - Abnormal; Notable for the following components:   Color, Urine AMBER (*)    Protein, ur 100 (*)    Bacteria, UA RARE (*)    All other components within normal limits  SARS CORONAVIRUS 2 BY RT PCR [facility] ORDER, [facility])  LACTIC ACID, PLASMA    EKG EKG Interpretation  Date/Time:  Monday June 27 2020 16:29:08 EDT Ventricular Rate:  67 PR Interval:  154 QRS Duration: 104 QT Interval:  400 QTC Calculation: 422 R Axis:   88 Text Interpretation: Normal sinus rhythm Incomplete right bundle branch block Borderline ECG Confirmed by Gerlene Fee [phone]) on 06/28/2020 11:31:23 AM   Radiology DG Chest 2 View  Result Date: 06/28/2020 CLINICAL DATA:  Fever EXAM: CHEST - 2 VIEW COMPARISON:  May 03, 2020 FINDINGS: There are calcified granulomas on the left, stable. There is scarring in the right lower lobe region. There is minimal scarring in each lateral base  region. Lungs elsewhere are clear. Heart size and pulmonary vascularity normal. No adenopathy. There is aortic atherosclerosis. No bone lesions. Apparent hiatal hernia. IMPRESSION: Areas of scarring bilaterally,  primarily in the right lower lobe. Calcified granulomas on the left. No edema or airspace opacity. Heart size normal. Apparent hiatal hernia. Aortic Atherosclerosis (ICD10-I70.0). Electronically Signed   By: Lowella Grip III M.D.   On: 06/28/2020 12:46   CT Head Wo Contrast  Result Date: 06/28/2020 CLINICAL DATA:  Imbalance, recent falls, suspected stroke, history type II diabetes mellitus EXAM: CT HEAD WITHOUT CONTRAST TECHNIQUE: Contiguous axial images were obtained from the base of the skull through the vertex without intravenous contrast. Sagittal and coronal MPR images reconstructed from axial data set. COMPARISON:  10/10/2019 FINDINGS: Brain: Generalized atrophy. Normal ventricular morphology. No midline shift. Large CSF attenuation collection at the anterior aspect of the RIGHT middle cranial fossa consistent with an arachnoid cyst measuring 4.9 x 3.7 x 5.5 cm, minimally increased. No intracranial hemorrhage, additional mass lesion, or evidence of acute infarction. No extra-axial fluid collections. Vascular: Atherosclerotic calcifications of internal carotid arteries at skull base. No hyperdense vessels. Skull: Intact Sinuses/Orbits: Clear Other: N/A IMPRESSION: Generalized atrophy. Large arachnoid cyst at anterior aspect of the RIGHT middle cranial fossa, minimally increased in size since previous exam. No acute intracranial abnormalities. Electronically Signed   By: Lavonia Dana M.D.   On: 06/28/2020 14:19   US Abdomen Limited RUQ  Result Date: 06/28/2020 CLINICAL DATA:  Elevated liver enzymes EXAM: ULTRASOUND ABDOMEN LIMITED RIGHT UPPER QUADRANT COMPARISON:  October 10, 2019 FINDINGS: Gallbladder: The gallbladder appears somewhat contracted. Gallbladder wall is mildly thickened. No gallstones evident. No pericholecystic fluid. No sonographic Murphy sign noted by sonographer. Common bile duct: Diameter: 6 mm. No intrahepatic or extrahepatic biliary duct dilatation. Liver: No focal lesion  identified. Within normal limits in parenchymal echogenicity. Portal vein is patent on color Doppler imaging with normal direction of blood flow towards the liver. Other: There is a cyst arising from the lower pole of the right kidney measuring 4.8 x 4.4 x 3.8 cm, also present previously and unchanged in contour. IMPRESSION: 1. Gallbladder somewhat contracted with mildly thickened wall. Patient reports not having been NPO. The gallbladder wall thickening may well be due to the recent food consumption with contraction. No gallstones evident. If there is concern for potential acalculus cholecystitis, it would be advisable to do a repeat study after minimum of 8 hours fasting or consider nuclear medicine hepatobiliary imaging study to assess for cystic duct patency. 2. Cyst arising from lower pole right kidney, also noted previously. Three. Study otherwise unremarkable. In particular, no liver lesions demonstrable on this study. Electronically Signed   By: Lowella Grip III M.D.   On: 06/28/2020 13:17    Procedures Procedures (including critical care time)  Medications Ordered in ED Medications - No data to display  ED Course  I have reviewed the triage vital signs and the nursing notes.  Pertinent labs & imaging results that were available during my care of the patient were reviewed by me and considered in my medical decision making (see chart for details).  Patient seen and examined.  Overall he looks very well.  Initial and previous work-up reviewed.  It appears that the patient presented to the emergency department for epigastric pain 2 days ago, but didn't mention this at time of interview.  He had labs drawn with an elevated white blood cell count at that time.  Labs last night with improved white  blood cell count.  Patient has transaminitis of unknown etiology which is persistent.  He denies alcohol use or heavy Tylenol use.  Vital signs reviewed and are as follows: BP 115/73 (BP Location:  Right Arm)   Pulse (!) 58   Temp 97.9 F (36.6 C) (Oral)   Resp 18   Ht 6' (1.829 m)   Wt 77.1 kg   SpO2 98%   BMI 23.06 kg/m   EMS was initially called for frequent falls and weakness.  Metabolic work-up currently underway.  No UTI.  Chest x-ray ordered.  Will check noncontrast head CT.  Incidental transaminitis noted.  No focal right upper quadrant tenderness.  Normal bilirubin.  Will check right upper quadrant ultrasound.  Incidental fever noted on EMS arrival, not present overnight while in the emergency department.  Patient did have a white blood cell count of 16,000 2 days ago, now improved.  0.4 lymphocytes noted.  Covid pending.  Chest x-ray pending.  2:56 PM Reviewed CT findings with Dr. Sedonia Small. Discussed findings with patient.  At this point, he is ambulatory without any difficulty.  He has no focal neurologic deficits on exam.  Discussed importance of close PCP follow-up with the patient to follow several different issues at this point.  Transaminitis - unclear cause.  No acute findings on right upper quadrant ultrasound.  No upper abdominal tenderness.  Patient had isolated fever prior to arrival, now resolved.  Low concern for cholecystitis.  He will need liver enzymes rechecked.  Frequent falls/arachnoid cyst - chronic, but slightly larger compared to last November.  If he continues to have disequilibrium or ataxia, may need additional work-up as outpatient.  Fever -- 2 days ago patient with elevated white count, now normalized.  Unclear etiology.  Chest x-ray is clear.  UA without definite infection.   Patient encouraged to f/u with PCP. Patient counseled to return if they have weakness in their arms or legs, slurred speech, trouble walking or talking, confusion, trouble with their balance, or if they have any other concerns. Patient verbalizes understanding and agrees with plan.   Encouraged return with persistent fever, development of abdominal pain or new symptoms.   Patient verbalizes understanding agrees with plan.    MDM Rules/Calculators/A&P                           Final Clinical Impression(s) / ED Diagnoses Final diagnoses:  Elevated LFTs  Frequent falls  Dysequilibrium  Intracranial arachnoid cyst  Fever, unspecified fever cause    Rx / DC Orders ED Discharge Orders    None       Carlisle Cater, PA-C 06/28/20 1502    Maudie Flakes, MD 06/28/20 1534

## 2020-06-28 NOTE — Discharge Instructions (Signed)
Please read and follow all provided instructions.  Your diagnoses today include:  1. Frequent falls   2. Elevated LFTs   3. Dysequilibrium   4. Intracranial arachnoid cyst   5. Fever, unspecified fever cause     Tests performed today include: Blood counts and electrolytes - normal Liver function tests - are high, this needs to be rechecked by your doctor CT of your brain - shows a cyst in the brain that has been there in the past -- but is slightly larger today. You will need to follow-up with your doctor regarding this.  If your symptoms are not improving, you may need to see a specialist to have this evaluated. Covid test - was negative  Vital signs. See below for your results today.   Medications prescribed:  None  Take any prescribed medications only as directed.  Home care instructions:  Follow any educational materials contained in this packet.  BE VERY CAREFUL not to take multiple medicines containing Tylenol (also called acetaminophen). Doing so can lead to an overdose which can damage your liver and cause liver failure and possibly death.   Follow-up instructions: Please follow-up with your primary care provider in the next 3 days for further evaluation of your symptoms.   Return instructions:  Please return to the Emergency Department if you experience worsening symptoms.  Return if you have weakness in your arms or legs, slurred speech, trouble walking or talking, confusion, or trouble with your balance.  Please return if you have any other emergent concerns.  Additional Information:  Your vital signs today were: BP 115/73 (BP Location: Right Arm)    Pulse (!) 58    Temp 97.9 F (36.6 C) (Oral)    Resp 18    Ht 6' (1.829 m)    Wt 77.1 kg    SpO2 98%    BMI 23.06 kg/m  If your blood pressure (BP) was elevated above 135/85 this visit, please have this repeated by your doctor within one month. --------------

## 2020-06-29 ENCOUNTER — Telehealth: Payer: Self-pay

## 2020-06-29 NOTE — Telephone Encounter (Signed)
Spoke to pt about recent ER visit. Made appt 07-07-20.

## 2020-07-05 ENCOUNTER — Other Ambulatory Visit: Payer: Self-pay

## 2020-07-05 ENCOUNTER — Ambulatory Visit (INDEPENDENT_AMBULATORY_CARE_PROVIDER_SITE_OTHER): Payer: Medicare HMO | Admitting: Internal Medicine

## 2020-07-05 ENCOUNTER — Encounter: Payer: Self-pay | Admitting: Internal Medicine

## 2020-07-05 VITALS — BP 118/72 | HR 55 | Temp 98.2°F | Ht 72.0 in | Wt 196.0 lb

## 2020-07-05 DIAGNOSIS — R296 Repeated falls: Secondary | ICD-10-CM | POA: Diagnosis not present

## 2020-07-05 DIAGNOSIS — G930 Cerebral cysts: Secondary | ICD-10-CM

## 2020-07-05 MED ORDER — TRUE METRIX BLOOD GLUCOSE TEST VI STRP: ORAL_STRIP | 3 refills | Status: DC

## 2020-07-05 MED ORDER — TRUEPLUS LANCETS 30G MISC: 1.0000 [IU] | Freq: Every day | 3 refills | Status: DC

## 2020-07-05 NOTE — Assessment & Plan Note (Signed)
Describes imbalance that would be consistent with his diabetic neuropathy---but it was not ongoing Discussed need to use cane/walking stick when has imbalance again

## 2020-07-05 NOTE — Progress Notes (Signed)
Subjective:    Patient ID: Frank Carlson, male    DOB: 09-Jan-1941, 79 y.o.   MRN: [id number]  HPI Here for ER follow up This visit occurred during the SARS-CoV-2 public health emergency.  Safety protocols were in place, including screening questions prior to the visit, additional usage of staff PPE, and extensive cleaning of exam room while observing appropriate contact time as indicated for disinfecting solutions.   Still has problems with his balance Noticed that for 3 days--upon arising-- felt unstable Had 2-3 falls over several days That feeling is better now  No syncope No dizziness or vertigo Seen in ER after one fall---CT just showed slightly enlarged arachnoid cyst Only went due to wife's concern and calling 911  Current Outpatient Medications on File Prior to Visit  Medication Sig Dispense Refill  . Calcium Carb-Ergocalciferol (CHEWABLE CALCIUM/D PO) Take 1 tablet by mouth at bedtime.     Marland Kitchen glucose blood (TRUE METRIX BLOOD GLUCOSE TEST) test strip Use to test blood sugar once a day. Dx Code: E08.40 (Patient taking differently: 1 each by Other route every 14 (fourteen) days. Use to test blood sugar twice a month  Dx Code: E08.40) 100 each 3  . metFORMIN (GLUCOPHAGE) 500 MG tablet Take 1 tablet (500 mg total) by mouth 2 (two) times daily with a meal. 180 tablet 3  . Multiple Vitamin (MULTIVITAMIN WITH MINERALS) TABS tablet Take 1 tablet by mouth every morning.     . Omega-3 Fatty Acids (FISH OIL PO) Take 1 capsule by mouth every morning.     . simvastatin (ZOCOR) 20 MG tablet TAKE 1 TABLET AT BEDTIME (Patient taking differently: Take 20 mg by mouth at bedtime. ) 90 tablet 3  . TRUEPLUS LANCETS 30G MISC 1 Units by Does not apply route daily. Use to check blood sugar once a day. Dx Code S28.31 (Patient taking differently: by Does not apply route See admin instructions. Use to check blood sugar twice a month. Dx Code E08.40) 100 each 3   No current facility-administered  medications on file prior to visit.    Allergies  Allergen Reactions  . Bee Venom Other (See Comments)    Passed out    Past Medical History:  Diagnosis Date  . BPH (benign prostatic hypertrophy)   . COLONIC POLYPS, HX OF 03/15/2008  . 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 CONCUSSION WITH LOC OF 30 MINUTES OR LESS 11/01/2009  . Diabetes mellitus without complication ([place])    TYPE 2  . ED (erectile dysfunction)   . HEARING LOSS, BILATERAL 11/01/2009   Wears hearing aids  . Hyperlipidemia   . JOINT STIFFNESS, HAND 10/28/2008  . Pleural effusion 10/2019  . Pneumonia   . Type II or unspecified type diabetes mellitus with neurological manifestations, not stated as uncontrolled(250.60) 1/15   Type 2    Past Surgical History:  Procedure Laterality Date  . CATARACT EXTRACTION Bilateral 07/2010   OD with IOL  . COLONOSCOPY W/ POLYPECTOMY    . INCISION / DRAINAGE HAND / FINGER Right   . INGUINAL HERNIA REPAIR Right 08/23/2015   Procedure: LAPAROSCOPIC RIGHT INGUINAL HERNIA REPAIR WITH MESH;  Surgeon: Ralene Ok, MD;  Location: [facility];  Service: General;  Laterality: Right;  . INSERTION OF MESH Right 08/23/2015   Procedure: INSERTION OF MESH;  Surgeon: Ralene Ok, MD;  Location: [facility];  Service: General;  Laterality: Right;  . IR THORACENTESIS ASP PLEURAL SPACE W/IMG GUIDE  10/15/2019  . TONSILLECTOMY    .  VASECTOMY      Family History  Problem Relation Age of Onset  . Alzheimer's disease Mother   . Dementia Mother   . Coronary artery disease Father   . Heart disease Father   . Coronary artery disease Other   . Diabetes Other   . Alzheimer's disease Sister   . Dementia Brother   . Alzheimer's disease Brother     Social History   Socioeconomic History  . Marital status: Married    Spouse name: Lovey Newcomer  . Number of children: 3  . Years of education: 34  . Highest education level: Not on file  Occupational History  . Occupation: Therapist, occupational    Comment:  Retired  Tobacco Use  . Smoking status: Former Smoker    Packs/day: 1.50    Years: 5.00    Pack years: 7.50    Types: Cigarettes    Start date: 1955    Quit date: 11/25/1958    Years since quitting: 61.6  . Smokeless tobacco: Never Used  Vaping Use  . Vaping Use: Never used  Substance and Sexual Activity  . Alcohol use: No  . Drug use: No  . Sexual activity: Yes    Partners: Female  Other Topics Concern  . Not on file  Social History Narrative   [employer]- Estate manager/land agent.    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    Married '64. 3 sons- '66, '68, '71; 5 grandchildren, 1 step g-dtr.       Has living will   Wife is health care POA--then son Mitzi Hansen   Would accept resuscitation attempts but no prolonged ventilatory support   Probably wouldn't want prolonged tube feeds   Social Determinants of Health   Financial Resource Strain:   . Difficulty of Paying Living Expenses: Not on file  Food Insecurity:   . Worried About Charity fundraiser in the Last Year: Not on file  . Ran Out of Food in the Last Year: Not on file  Transportation Needs:   . Lack of Transportation (Medical): Not on file  . Lack of Transportation (Non-Medical): Not on file  Physical Activity:   . Days of Exercise per Week: Not on file  . Minutes of Exercise per Session: Not on file  Stress:   . Feeling of Stress : Not on file  Social Connections:   . Frequency of Communication with Friends and Family: Not on file  . Frequency of Social Gatherings with Friends and Family: Not on file  . Attends Religious Services: Not on file  . Active Member of Clubs or Organizations: Not on file  . Attends Archivist Meetings: Not on file  . Marital Status: Not on file  Intimate Partner Violence:   . Fear of Current or Ex-Partner: Not on file  . Emotionally Abused: Not on file  . Physically Abused: Not on file  . Sexually Abused: Not on file   Review of Systems No headaches No focal  weakness    Objective:   Physical Exam Constitutional:      Appearance: Normal appearance.  Neurological:     Mental Status: He is alert.     Cranial Nerves: Cranial nerves are intact.     Motor: Motor function is intact.     Coordination: Coordination is intact. Romberg sign negative. Coordination normal. Finger-Nose-Finger Test normal.     Gait: Gait normal.            Assessment & Plan:

## 2020-07-05 NOTE — Addendum Note (Signed)
Addended by: Elta Guadeloupe on: 07/05/2020 11:55 AM   Modules accepted: Orders

## 2020-07-05 NOTE — Assessment & Plan Note (Signed)
Unlikely that this is implicated in his falls---but it is exerting a pressure effect Will set up with neurosurgery to be sure it doesn't require action

## 2020-07-05 NOTE — Patient Instructions (Signed)
Please use a cane or a walking stick all the time when you feel any trouble with your balance.

## 2020-07-19 DIAGNOSIS — G930 Cerebral cysts: Secondary | ICD-10-CM | POA: Diagnosis not present

## 2020-07-20 ENCOUNTER — Other Ambulatory Visit: Payer: Self-pay | Admitting: Internal Medicine

## 2020-07-23 ENCOUNTER — Other Ambulatory Visit: Payer: Self-pay

## 2020-07-23 ENCOUNTER — Ambulatory Visit (INDEPENDENT_AMBULATORY_CARE_PROVIDER_SITE_OTHER): Payer: Medicare HMO

## 2020-07-23 DIAGNOSIS — Z23 Encounter for immunization: Secondary | ICD-10-CM

## 2020-08-06 ENCOUNTER — Emergency Department (HOSPITAL_COMMUNITY): Payer: Medicare HMO

## 2020-08-06 ENCOUNTER — Encounter (HOSPITAL_COMMUNITY): Payer: Self-pay

## 2020-08-06 ENCOUNTER — Other Ambulatory Visit: Payer: Self-pay

## 2020-08-06 ENCOUNTER — Emergency Department (HOSPITAL_COMMUNITY)
Admission: EM | Admit: 2020-08-06 | Discharge: 2020-08-06 | Disposition: A | Discharge status: Home / Self Care | Payer: Medicare HMO | Attending: Emergency Medicine | Admitting: Emergency Medicine

## 2020-08-06 DIAGNOSIS — H6121 Impacted cerumen, right ear: Secondary | ICD-10-CM | POA: Diagnosis not present

## 2020-08-06 DIAGNOSIS — R531 Weakness: Secondary | ICD-10-CM | POA: Diagnosis not present

## 2020-08-06 DIAGNOSIS — R110 Nausea: Secondary | ICD-10-CM | POA: Diagnosis not present

## 2020-08-06 DIAGNOSIS — R29818 Other symptoms and signs involving the nervous system: Secondary | ICD-10-CM | POA: Diagnosis not present

## 2020-08-06 DIAGNOSIS — E1149 Type 2 diabetes mellitus with other diabetic neurological complication: Secondary | ICD-10-CM | POA: Diagnosis not present

## 2020-08-06 DIAGNOSIS — Z87891 Personal history of nicotine dependence: Secondary | ICD-10-CM | POA: Diagnosis not present

## 2020-08-06 DIAGNOSIS — Z7984 Long term (current) use of oral hypoglycemic drugs: Secondary | ICD-10-CM | POA: Diagnosis not present

## 2020-08-06 DIAGNOSIS — J984 Other disorders of lung: Secondary | ICD-10-CM | POA: Diagnosis not present

## 2020-08-06 DIAGNOSIS — R112 Nausea with vomiting, unspecified: Secondary | ICD-10-CM | POA: Diagnosis not present

## 2020-08-06 DIAGNOSIS — G930 Cerebral cysts: Secondary | ICD-10-CM | POA: Diagnosis not present

## 2020-08-06 DIAGNOSIS — J8410 Pulmonary fibrosis, unspecified: Secondary | ICD-10-CM | POA: Diagnosis not present

## 2020-08-06 DIAGNOSIS — R42 Dizziness and giddiness: Secondary | ICD-10-CM

## 2020-08-06 DIAGNOSIS — R1111 Vomiting without nausea: Secondary | ICD-10-CM | POA: Diagnosis not present

## 2020-08-06 LAB — URINALYSIS, ROUTINE W REFLEX MICROSCOPIC
Bilirubin Urine: NEGATIVE
Glucose, UA: NEGATIVE mg/dL
Hgb urine dipstick: NEGATIVE
Ketones, ur: 5 mg/dL — AB
Leukocytes,Ua: NEGATIVE
Nitrite: NEGATIVE
Protein, ur: NEGATIVE mg/dL
Specific Gravity, Urine: 1.014 (ref 1.005–1.030)
pH: 6 (ref 5.0–8.0)

## 2020-08-06 LAB — APTT: aPTT: 27 seconds (ref 24–36)

## 2020-08-06 LAB — DIFFERENTIAL
Abs Immature Granulocytes: 0.03 10*3/uL (ref 0.00–0.07)
Basophils Absolute: 0 10*3/uL (ref 0.0–0.1)
Basophils Relative: 0 %
Eosinophils Absolute: 0 10*3/uL (ref 0.0–0.5)
Eosinophils Relative: 0 %
Immature Granulocytes: 0 %
Lymphocytes Relative: 8 %
Lymphs Abs: 0.9 10*3/uL (ref 0.7–4.0)
Monocytes Absolute: 0.6 10*3/uL (ref 0.1–1.0)
Monocytes Relative: 5 %
Neutro Abs: 10.6 10*3/uL — ABNORMAL HIGH (ref 1.7–7.7)
Neutrophils Relative %: 87 %

## 2020-08-06 LAB — I-STAT CHEM 8, ED
BUN: 13 mg/dL (ref 8–23)
Calcium, Ion: 1.07 mmol/L — ABNORMAL LOW (ref 1.15–1.40)
Chloride: 102 mmol/L (ref 98–111)
Creatinine, Ser: 0.9 mg/dL (ref 0.61–1.24)
Glucose, Bld: 158 mg/dL — ABNORMAL HIGH (ref 70–99)
HCT: 39 % (ref 39.0–52.0)
Hemoglobin: 13.3 g/dL (ref 13.0–17.0)
Potassium: 3.9 mmol/L (ref 3.5–5.1)
Sodium: 139 mmol/L (ref 135–145)
TCO2: 23 mmol/L (ref 22–32)

## 2020-08-06 LAB — COMPREHENSIVE METABOLIC PANEL
ALT: 15 U/L (ref 0–44)
AST: 18 U/L (ref 15–41)
Albumin: 3.7 g/dL (ref 3.5–5.0)
Alkaline Phosphatase: 39 U/L (ref 38–126)
Anion gap: 12 (ref 5–15)
BUN: 13 mg/dL (ref 8–23)
CO2: 23 mmol/L (ref 22–32)
Calcium: 8.9 mg/dL (ref 8.9–10.3)
Chloride: 103 mmol/L (ref 98–111)
Creatinine, Ser: 0.92 mg/dL (ref 0.61–1.24)
GFR calc Af Amer: >60 mL/min (ref >60)
GFR calc non Af Amer: >60 mL/min (ref >60)
Glucose, Bld: 160 mg/dL — ABNORMAL HIGH (ref 70–99)
Potassium: 3.8 mmol/L (ref 3.5–5.1)
Sodium: 138 mmol/L (ref 135–145)
Total Bilirubin: 0.8 mg/dL (ref 0.3–1.2)
Total Protein: 7.1 g/dL (ref 6.5–8.1)

## 2020-08-06 LAB — CBC
HCT: 40.1 % (ref 39.0–52.0)
Hemoglobin: 13 g/dL (ref 13.0–17.0)
MCH: 28.4 pg (ref 26.0–34.0)
MCHC: 32.4 g/dL (ref 30.0–36.0)
MCV: 87.7 fL (ref 80.0–100.0)
Platelets: 150 10*3/uL (ref 150–400)
RBC: 4.57 MIL/uL (ref 4.22–5.81)
RDW: 13.6 % (ref 11.5–15.5)
WBC: 12.2 10*3/uL — ABNORMAL HIGH (ref 4.0–10.5)
nRBC: 0 % (ref 0.0–0.2)

## 2020-08-06 LAB — PROTIME-INR
INR: 1.1 (ref 0.8–1.2)
Prothrombin Time: 13.7 seconds (ref 11.4–15.2)

## 2020-08-06 LAB — TROPONIN I (HIGH SENSITIVITY)
Troponin I (High Sensitivity): 7 ng/L (ref <18)
Troponin I (High Sensitivity): 11 ng/L (ref <18)

## 2020-08-06 LAB — CBG MONITORING, ED: Glucose-Capillary: 152 mg/dL — ABNORMAL HIGH (ref 70–99)

## 2020-08-06 MED ORDER — MECLIZINE HCL 25 MG PO TABS
25.0000 mg | ORAL_TABLET | Freq: Once | ORAL | Status: AC
  Administered 2020-08-06: 25 mg via ORAL
  Filled 2020-08-06: qty 1

## 2020-08-06 MED ORDER — SODIUM CHLORIDE 0.9% FLUSH: 3.0000 mL | Freq: Once | INTRAVENOUS | Status: DC

## 2020-08-06 MED ORDER — IOHEXOL 350 MG/ML SOLN
100.0000 mL | Freq: Once | INTRAVENOUS | Status: AC | PRN
  Administered 2020-08-06: 100 mL via INTRAVENOUS

## 2020-08-06 MED ORDER — MECLIZINE HCL 25 MG PO TABS: 25.0000 mg | ORAL_TABLET | Freq: Three times a day (TID) | ORAL | 0 refills | Status: DC | PRN

## 2020-08-06 MED ORDER — SODIUM CHLORIDE 0.9 % IV BOLUS
500.0000 mL | Freq: Once | INTRAVENOUS | Status: AC
  Administered 2020-08-06: 500 mL via INTRAVENOUS

## 2020-08-06 NOTE — ED Triage Notes (Signed)
Patient arrived by [facility] complaining of dizziness only when he stands, vomiting with same. On assessment alert and oriented, denies pain. No neuro deficits. States that the symptoms started at 930-10am

## 2020-08-06 NOTE — ED Provider Notes (Signed)
[facility] Provider Note   Carlson: [id number] Arrival date & time: 08/06/20  1329     History No chief complaint on file.   [facility] Carlson is a 79 y.o. male with a past medical history of DM, hyperlipidemia, arachnoid cyst, who presents today for evaluation of dizziness.  He reports that at about 10:30 AM he was sitting at the computer when he became nauseous and vomited.  He has vomited 3 times today since this started.  He states that whenever he stands or sits up he has this feeling of being off balance and like things are moving.  He does not have a history of vertigo.  He denies any changes.  No recent trauma or chiropractic adjustments.  He denies any fevers.  No abdominal pain or diarrhea.  He denies any vision changes.  No chest pain, cough, or shortness of breath.  He reports compliance with his medications at home.   HPI     Past Medical History:  Diagnosis Date  . BPH (benign prostatic hypertrophy)   . COLONIC POLYPS, HX OF 03/15/2008  . 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 CONCUSSION WITH LOC OF 30 MINUTES OR LESS 11/01/2009  . Diabetes mellitus without complication ([place])    TYPE 2  . ED (erectile dysfunction)   . HEARING LOSS, BILATERAL 11/01/2009   Wears hearing aids  . Hyperlipidemia   . JOINT STIFFNESS, HAND 10/28/2008  . Pleural effusion 10/2019  . Pneumonia   . Type II or unspecified type diabetes mellitus with neurological manifestations, not stated as uncontrolled(250.60) 1/15   Type 2    Patient Active Problem List   Diagnosis Date Noted  . Frequent falls 07/05/2020  . Arachnoid cyst 07/05/2020  . Aortic atherosclerosis ([place]) 05/03/2020  . Loculated pleural effusion 10/10/2019  . Anemia 10/10/2019  . Seborrheic keratosis 12/03/2016  . BPH with obstruction/lower urinary tract symptoms 04/03/2016  . Advance directive discussed with patient 03/28/2015  . Hyperlipidemia   . Diabetes mellitus with neurological  manifestations, controlled ([place]) 12/08/2013  . Routine health maintenance 11/26/2011  . 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 Presbycusis of both ears 11/01/2009  . COLONIC POLYPS, HX OF 03/15/2008    Past Surgical History:  Procedure Laterality Date  . CATARACT EXTRACTION Bilateral 07/2010   OD with IOL  . COLONOSCOPY W/ POLYPECTOMY    . INCISION / DRAINAGE HAND / FINGER Right   . INGUINAL HERNIA REPAIR Right 08/23/2015   Procedure: LAPAROSCOPIC RIGHT INGUINAL HERNIA REPAIR WITH MESH;  Surgeon: Ralene Ok, MD;  Location: [facility];  Service: General;  Laterality: Right;  . INSERTION OF MESH Right 08/23/2015   Procedure: INSERTION OF MESH;  Surgeon: Ralene Ok, MD;  Location: [facility];  Service: General;  Laterality: Right;  . IR THORACENTESIS ASP PLEURAL SPACE W/IMG GUIDE  10/15/2019  . TONSILLECTOMY    . VASECTOMY         Family History  Problem Relation Age of Onset  . Alzheimer's disease Mother   . Dementia Mother   . Coronary artery disease Father   . Heart disease Father   . Coronary artery disease Other   . Diabetes Other   . Alzheimer's disease Sister   . Dementia Brother   . Alzheimer's disease Brother     Social History   Tobacco Use  . Smoking status: Former Smoker    Packs/day: 1.50    Years: 5.00    Pack years: 7.50    Types: Cigarettes  Start date: 89    Quit date: 11/25/1958    Years since quitting: 61.7  . Smokeless tobacco: Never Used  Vaping Use  . Vaping Use: Never used  Substance Use Topics  . Alcohol use: No  . Drug use: No    Home Medications Prior to Admission medications   Medication Sig Start Date End Date Taking? Authorizing Provider  Calcium Carbonate-Vitamin D (CALCIUM-D PO) Take 1 tablet by mouth at bedtime.   Yes [provider]  metFORMIN (GLUCOPHAGE) 500 MG tablet TAKE 1 TABLET TWO TIMES DAILY WITH A MEAL. Patient taking differently: Take 500 mg by mouth 2 (two) times daily with a meal.  07/21/20  Yes Venia Carbon,  MD  Multiple Vitamin (MULTIVITAMIN WITH MINERALS) TABS tablet Take 1 tablet by mouth every morning.    Yes [provider]  Omega-3 Fatty Acids (FISH OIL PO) Take 1 capsule by mouth every morning.    Yes [provider]  polyvinyl alcohol (ARTIFICIAL TEARS) 1.4 % ophthalmic solution Place 1 drop into both eyes 2 (two) times daily.   Yes [provider]  simvastatin (ZOCOR) 20 MG tablet TAKE 1 TABLET AT BEDTIME Patient taking differently: Take 20 mg by mouth at bedtime.  03/24/20  Yes Viviana Simpler I, MD  glucose blood (TRUE METRIX BLOOD GLUCOSE TEST) test strip Use to test blood sugar once a day. Dx Code: E08.40 07/05/20   Venia Carbon, MD  meclizine (ANTIVERT) 25 MG tablet Take 1 tablet (25 mg total) by mouth 3 (three) times daily as needed for dizziness. 08/06/20   Lorin Glass, PA-C  TRUEplus Lancets 30G MISC 1 Units by Does not apply route daily. Use to check blood sugar once a day. Dx Code S06.30 07/05/20   Venia Carbon, MD    Allergies    Bee venom  Review of Systems   Review of Systems  Constitutional: Negative for chills and fever.  Eyes: Negative for visual disturbance.  Respiratory: Negative for cough, chest tightness and shortness of breath.   Cardiovascular: Negative for chest pain.  Gastrointestinal: Positive for nausea and vomiting. Negative for abdominal pain, constipation and diarrhea.  Genitourinary: Negative for dysuria.  Musculoskeletal: Negative for back pain and neck pain.  Skin: Negative for color change, rash and wound.  Neurological: Positive for dizziness. Negative for seizures, syncope, speech difficulty, weakness, numbness and headaches.  Psychiatric/Behavioral: Negative for confusion.  All other systems reviewed and are negative.   Physical Exam Updated Vital Signs BP 123/63 (BP Location: Left Arm)   Pulse 67   Temp 97.8 F (36.6 C) (Oral)   Resp 17   SpO2 100%   Physical Exam Vitals and nursing note  reviewed.  Constitutional:      Appearance: He is well-developed.  HENT:     Head: Normocephalic and atraumatic.     Right Ear: External ear normal. There is impacted cerumen.     Left Ear: Tympanic membrane, ear canal and external ear normal.  Eyes:     Conjunctiva/sclera: Conjunctivae normal.  Cardiovascular:     Rate and Rhythm: Normal rate and regular rhythm.     Pulses: Normal pulses.     Heart sounds: Normal heart sounds. No murmur heard.   Pulmonary:     Effort: Pulmonary effort is normal. No respiratory distress.     Breath sounds: Normal breath sounds.  Abdominal:     General: There is no distension.     Palpations: Abdomen is soft.  Tenderness: There is no abdominal tenderness. There is no guarding.  Musculoskeletal:     Cervical back: Normal range of motion and neck supple. No rigidity.     Right lower leg: No edema.     Left lower leg: No edema.  Skin:    General: Skin is warm and dry.  Neurological:     Mental Status: He is alert.     Comments: Mental Status:  Alert, oriented, thought content appropriate, able to give a coherent history. Speech fluent without evidence of aphasia. Able to follow 2 step commands without difficulty.  Cranial Nerves:  II:  Peripheral visual fields grossly normal, pupils equal, round, reactive to light III,IV, VI: ptosis not present, extra-ocular motions intact bilaterally  V,VII: smile symmetric, facial light touch sensation equal VIII: hearing baseline normal to voice  X: uvula elevates symmetrically  XI: bilateral shoulder shrug symmetric and strong XII: midline tongue extension without fassiculations Motor:  Normal tone. 5/5 in upper and lower extremities bilaterally including strong and equal grip strength and dorsiflexion/plantar flexion.  He does have questionable pronator drift on the right side.  Cerebellar: normal finger-to-nose with bilateral upper extremities Gait:When stood to transfer from wheel chair to bed he was  too off balance and had to be helped into bed to avoid falling CV: distal pulses palpable throughout    Psychiatric:        Mood and Affect: Mood normal.        Behavior: Behavior normal.     ED Results / Procedures / Treatments   Labs (all labs ordered are listed, but only abnormal results are displayed) Labs Reviewed  CBC - Abnormal; Notable for the following components:      Result Value   WBC 12.2 (*)    All other components within normal limits  DIFFERENTIAL - Abnormal; Notable for the following components:   Neutro Abs 10.6 (*)    All other components within normal limits  COMPREHENSIVE METABOLIC PANEL - Abnormal; Notable for the following components:   Glucose, Bld 160 (*)    All other components within normal limits  URINALYSIS, ROUTINE W REFLEX MICROSCOPIC - Abnormal; Notable for the following components:   Ketones, ur 5 (*)    All other components within normal limits  I-STAT CHEM 8, ED - Abnormal; Notable for the following components:   Glucose, Bld 158 (*)    Calcium, Ion 1.07 (*)    All other components within normal limits  CBG MONITORING, ED - Abnormal; Notable for the following components:   Glucose-Capillary 152 (*)    All other components within normal limits  PROTIME-INR  APTT  TROPONIN I (HIGH SENSITIVITY)  TROPONIN I (HIGH SENSITIVITY)    EKG EKG Interpretation  Date/Time:  Saturday August 06 2020 13:32:15 EDT Ventricular Rate:  59 PR Interval:  170 QRS Duration: 108 QT Interval:  460 QTC Calculation: 455 R Axis:   90 Text Interpretation: Sinus bradycardia Rightward axis Incomplete right bundle branch block Borderline ECG No sig change from prior ECG, No STEMI Confirmed by Octaviano Glow [phone]) on 08/06/2020 2:57:12 PM   Radiology CT Angio Head W/Cm &/Or Wo Cm  Result Date: 08/06/2020 CLINICAL DATA:  Neuro deficit EXAM: CT ANGIOGRAPHY HEAD AND NECK TECHNIQUE: Multidetector CT imaging of the head and neck was performed using the standard  protocol during bolus administration of intravenous contrast. Multiplanar CT image reconstructions and MIPs were obtained to evaluate the vascular anatomy. Carotid stenosis measurements (when applicable) are obtained utilizing NASCET criteria,  using the distal internal carotid diameter as the denominator. CONTRAST:  172mL OMNIPAQUE IOHEXOL 350 MG/ML SOLN COMPARISON:  08/06/2020 head CT and prior. 08/06/2020 MRI head. FINDINGS: CTA NECK FINDINGS Aortic arch: Standard branching. Imaged portion shows no evidence of aneurysm or dissection. No significant stenosis of the major arch vessel origins. Right carotid system: No evidence of dissection, stenosis (50% or greater) or occlusion. Bifurcation atherosclerotic calcifications. Left carotid system: No evidence of dissection, stenosis (50% or greater) or occlusion. Bifurcation atherosclerotic calcifications. Vertebral arteries: Dominant right vertebral artery. No evidence of dissection, stenosis (50% or greater) or occlusion. Skeleton: No acute osseous abnormality.  Multilevel spondylosis. Other neck: No acute finding. Upper chest: Dependent atelectasis. Review of the MIP images confirms the above findings CTA HEAD FINDINGS Anterior circulation: No significant stenosis, proximal occlusion, aneurysm, or vascular malformation. Bilateral carotid siphon atherosclerotic calcifications. Right A1 segment hypoplasia. Posterior circulation: No significant stenosis, proximal occlusion, aneurysm, or vascular malformation. Dominant right vertebral artery. Left vertebral artery terminates as PICA. Fetal origin of the right PCA. Basilar artery terminates as the left PCA. Venous sinuses: As permitted by contrast timing, patent. Anatomic variants: As detailed above. Review of the MIP images confirms the above findings IMPRESSION: No evidence of high-grade narrowing, large vessel occlusion, dissection or aneurysm. Electronically Signed   By: Primitivo Gauze M.D.   On: 08/06/2020 19:28    CT HEAD WO CONTRAST  Result Date: 08/06/2020 CLINICAL DATA:  Headache, dizziness, nausea and vomiting. EXAM: CT HEAD WITHOUT CONTRAST TECHNIQUE: Contiguous axial images were obtained from the base of the skull through the vertex without intravenous contrast. COMPARISON:  CT head dated 06/28/2020 FINDINGS: Brain: No hemorrhage. Again noted is a arachnoid cyst in the anterior middle cranial fossa on the right abutting the anterior temporal lobe. This is similar to prior study.No midline shift. There is atrophy.The basal cisterns are unremarkable. Patchy and confluent areas of decreased attenuation are noted throughout the deep and periventricular white matter of the cerebral hemispheres bilaterally, compatible with chronic microvascular ischemic disease. The brainstem is unremarkable. The cerebellum is unremarkable. The sella is unremarkable. Vascular: No hyperdense vessel or unexpected calcification. Skull: The calvarium is unremarkable. The skull base is unremarkable. The visualized upper cervical spine is unremarkable. Sinuses/Orbits: The visualized orbits are unremarkable. The paranasal sinuses are unremarkable. The mastoid air cells are clear. Other: The visualized parotid gland is unremarkable. There is no scalp soft tissue swelling. IMPRESSION: 1. No acute intracranial abnormality. 2. Atrophy and chronic microvascular ischemic disease. Electronically Signed   By: Constance Holster M.D.   On: 08/06/2020 15:17   CT Angio Neck W and/or Wo Contrast  Result Date: 08/06/2020 CLINICAL DATA:  Neuro deficit EXAM: CT ANGIOGRAPHY HEAD AND NECK TECHNIQUE: Multidetector CT imaging of the head and neck was performed using the standard protocol during bolus administration of intravenous contrast. Multiplanar CT image reconstructions and MIPs were obtained to evaluate the vascular anatomy. Carotid stenosis measurements (when applicable) are obtained utilizing NASCET criteria, using the distal internal carotid  diameter as the denominator. CONTRAST:  138mL OMNIPAQUE IOHEXOL 350 MG/ML SOLN COMPARISON:  08/06/2020 head CT and prior. 08/06/2020 MRI head. FINDINGS: CTA NECK FINDINGS Aortic arch: Standard branching. Imaged portion shows no evidence of aneurysm or dissection. No significant stenosis of the major arch vessel origins. Right carotid system: No evidence of dissection, stenosis (50% or greater) or occlusion. Bifurcation atherosclerotic calcifications. Left carotid system: No evidence of dissection, stenosis (50% or greater) or occlusion. Bifurcation atherosclerotic calcifications. Vertebral arteries: Dominant right vertebral artery.  No evidence of dissection, stenosis (50% or greater) or occlusion. Skeleton: No acute osseous abnormality.  Multilevel spondylosis. Other neck: No acute finding. Upper chest: Dependent atelectasis. Review of the MIP images confirms the above findings CTA HEAD FINDINGS Anterior circulation: No significant stenosis, proximal occlusion, aneurysm, or vascular malformation. Bilateral carotid siphon atherosclerotic calcifications. Right A1 segment hypoplasia. Posterior circulation: No significant stenosis, proximal occlusion, aneurysm, or vascular malformation. Dominant right vertebral artery. Left vertebral artery terminates as PICA. Fetal origin of the right PCA. Basilar artery terminates as the left PCA. Venous sinuses: As permitted by contrast timing, patent. Anatomic variants: As detailed above. Review of the MIP images confirms the above findings IMPRESSION: No evidence of high-grade narrowing, large vessel occlusion, dissection or aneurysm. Electronically Signed   By: Primitivo Gauze M.D.   On: 08/06/2020 19:28   MR BRAIN WO CONTRAST  Result Date: 08/06/2020 CLINICAL DATA:  Dizziness EXAM: MRI HEAD WITHOUT CONTRAST TECHNIQUE: Multiplanar, multiecho pulse sequences of the brain and surrounding structures were obtained without intravenous contrast. COMPARISON:  08/06/2020 head CT  and prior. FINDINGS: Brain: No diffusion-weighted signal abnormality. No acute intracranial hemorrhage. Moderate cerebral atrophy with ex vacuo dilatation. Mild chronic microvascular ischemic changes. Unchanged right middle cranial fossa arachnoid cyst measuring 4.7 x 3.7 cm (10:8). Sequela of remote left midbrain insult. No midline shift, ventriculomegaly or extra-axial fluid collection. No mass lesion. Vascular: Major intracranial flow voids are preserved. Skull and upper cervical spine: Normal marrow signal. Sinuses/Orbits: Sequela of bilateral lens replacement. Clear paranasal sinuses. Trace left mastoid effusion. Other: None. IMPRESSION: No acute intracranial process.  Remote left mid brain insult. Cerebral atrophy and mild chronic microvascular ischemic changes. 4.7 cm right middle cranial fossa arachnoid cyst, unchanged. Electronically Signed   By: Primitivo Gauze M.D.   On: 08/06/2020 18:06   DG Chest Portable 1 View  Result Date: 08/06/2020 CLINICAL DATA:  Balance disturbance.  Dizziness. EXAM: PORTABLE CHEST 1 VIEW COMPARISON:  Radiograph 06/28/2020 FINDINGS: The cardiomediastinal contours are normal. Minimal right basilar scarring. Pulmonary vasculature is normal. No consolidation, pleural effusion, or pneumothorax. Again seen calcified granuloma in the left lung. No acute osseous abnormalities are seen. IMPRESSION: Minimal right basilar scarring. No acute findings. Electronically Signed   By: Keith Rake M.D.   On: 08/06/2020 16:05    Procedures Procedures (including critical care time)  Medications Ordered in ED Medications  sodium chloride flush (NS) 0.9 % injection 3 mL (has no administration in time range)  meclizine (ANTIVERT) tablet 25 mg (25 mg Oral Given 08/06/20 1600)  sodium chloride 0.9 % bolus 500 mL (0 mLs Intravenous Stopped 08/06/20 1828)  iohexol (OMNIPAQUE) 350 MG/ML injection 100 mL (100 mLs Intravenous Contrast Given 08/06/20 1855)    ED Course  I have reviewed  the triage vital signs and the nursing notes.  Pertinent labs & imaging results that were available during my care of the patient were reviewed by me and considered in my medical decision making (see chart for details).  Clinical Course as of Aug 06 2349  Sat Aug 06, 2020  1532 I spoke with Dr. Rory Percy with neurology, he recommends MR brain with out contrast and CTA head and neck.    [EH]  1551  IMPRESSION: 1. No acute intracranial abnormality. 2. Atrophy and chronic microvascular ischemic disease   [MT]  4064 79 year old male w/ diabetes, HLD presenting to ED with acute onset of vertigo today after an episode of vomiting.  Patient woke up feeling well, ate breakfast, and  was working at his computer when he suddenly felt nauseous and vomited, then dry heaved.  Afterwards he noticed he was very vertiginious and lightheaded upon standing.  No prior hx of this.  He is having difficulty walking straight, but also reports he was having falls before this which he saw specialists for, and was felt to be due to diabetic neuropathy.  On exam he has no active symptoms while at rest.  Benign neuro exam.  No nystagmus.  Upon standing he develops symptoms of vertigo.  It is very possible this is BPPV or peripheral vertigo given the onset of symptoms today.  CTH negative, but in discussion with neurologist we'll obtain MRI and CTA imaging to evaluate posterior circulation.  Otherwise ECG is sinus rhythm, trop 7, doubt ACS or PE.  Glucose 152.    [MT]  2029 Pt imaging negative for stroke and posterior circulation injury.  He ambulated steadily in the ED.  Okay for d/c with meclizine, f/u with neurology.   [MT]    Clinical Course User Index [EH] Lorin Glass, PA-C [MT] Langston Masker Carola Rhine, MD   MDM Rules/Calculators/A&P                         Patient is an 79 year old man who presents today for evaluation of dizziness since 10 AM this morning.  At the time of my initial evaluation it has been 5-1/2  hours since his symptoms started.  According to staff when transferring from wheelchair to bed patient had to be caught to be kept from falling.  He denies near syncopal events or syncope or lightheadedness, rather feels like things are moving.  He does not have a history of vertigo.  CT head is obtained, shows arachnoid cyst without acute abnormalities.  Labs are obtained and reviewed, CMP has mild hyperglycemia at 160 however is otherwise unremarkable.  CBC shows mild leukocytosis at 12.2 however is otherwise reassuring.  PT INR and PTT are both normal.  He is given small fluid bolus as he takes Metformin in anticipation of contrasted CT scans.  He is given meclizine.  On my exam he does not have any obvious nystagmus.  When he sits up he does lean towards the left with questionable pronator drift on the right side however is otherwise unremarkable for neurologic exam.  I spoke with neurology.  Plan is to obtain MRI brain without contrast and a CTA of the head and neck.  MRI and CTA with out acute abnormality.    Patient was reevaluated after this, after meclizine and fluids he was able to stand and ambulate in the hallway without difficulty. He does have impacted cerumen in his right ear canal. This was not easily removed and recommended Debrox drops and outpatient follow-up.    He is given rx for meclizine.  Recommended outpatient follow up with his PCP and neurologist along with his hearing aid specialist or ENT for wax removal after debrox will hopefully soften it up.   This patient was seen as a shared visit with Dr. Langston Masker.   Troponin x2 is not elevated, EKG iwht out ischemia and CXR with out acute abnormalities.  Doubt acs.   Return precautions were discussed with patient who states their understanding.  At the time of discharge patient denied any unaddressed complaints or concerns.  Patient is agreeable for discharge home.  Note: Portions of this report may have been transcribed  using voice recognition software. Every effort was made  to ensure accuracy; however, inadvertent computerized transcription errors may be present   Final Clinical Impression(s) / ED Diagnoses Final diagnoses:  Dizziness  Impacted cerumen of right ear    Rx / DC Orders ED Discharge Orders         Ordered    meclizine (ANTIVERT) 25 MG tablet  3 times daily PRN,   Status:  Discontinued        08/06/20 2100    meclizine (ANTIVERT) 25 MG tablet  3 times daily PRN        08/06/20 2104           Lorin Glass, PA-C 08/06/20 2356    Wyvonnia Dusky, MD 08/07/20 [phone]

## 2020-08-06 NOTE — Discharge Instructions (Addendum)
Today you received medications that may make you sleepy or impair your ability to make decisions.  For the next 24 hours please do not drive, operate heavy machinery, care for a small child with out another adult present, or perform any activities that may cause harm to you or someone else if you were to fall asleep or be impaired.   You are being prescribed a medication which may make you sleepy. Please follow up of listed precautions for at least 24 hours after taking one dose.   Please use your Debrox or earwax remover drops.  Please do not put Q-tips or cotton swabs into your ears as this can pack the wax and make it harder to get out.  Please follow-up with your hearing aid specialist, primary care doctor, or the information for the ear nose and throat doctor that I have given you above to get the wax fully removed from your ears.  Please make sure you drink extra water to help your body flush out the contrast injection from the CT.  Please do not take your metformin for the next day and monitor your diet and blood sugar.

## 2020-09-21 ENCOUNTER — Telehealth: Payer: Self-pay

## 2020-09-21 NOTE — Progress Notes (Signed)
    Chronic Care Management Pharmacy Assistant   Name: Frank Carlson  MRN: [id number] DOB: 1941-07-26  Reason for Encounter: Disease State  Unable to reach pt prior to appointment  PCP : Venia Carbon, MD  Allergies:   Allergies  Allergen Reactions  . Bee Venom Other (See Comments)    Passed out    Medications: Outpatient Encounter Medications as of 09/21/2020  Medication Sig  . Calcium Carbonate-Vitamin D (CALCIUM-D PO) Take 1 tablet by mouth at bedtime.  Marland Kitchen glucose blood (TRUE METRIX BLOOD GLUCOSE TEST) test strip Use to test blood sugar once a day. Dx Code: E08.40  . meclizine (ANTIVERT) 25 MG tablet Take 1 tablet (25 mg total) by mouth 3 (three) times daily as needed for dizziness.  . metFORMIN (GLUCOPHAGE) 500 MG tablet TAKE 1 TABLET TWO TIMES DAILY WITH A MEAL. (Patient taking differently: Take 500 mg by mouth 2 (two) times daily with a meal. )  . Multiple Vitamin (MULTIVITAMIN WITH MINERALS) TABS tablet Take 1 tablet by mouth every morning.   . Omega-3 Fatty Acids (FISH OIL PO) Take 1 capsule by mouth every morning.   . polyvinyl alcohol (ARTIFICIAL TEARS) 1.4 % ophthalmic solution Place 1 drop into both eyes 2 (two) times daily.  . simvastatin (ZOCOR) 20 MG tablet TAKE 1 TABLET AT BEDTIME (Patient taking differently: Take 20 mg by mouth at bedtime. )  . TRUEplus Lancets 30G MISC 1 Units by Does not apply route daily. Use to check blood sugar once a day. Dx Code E08.40   No facility-administered encounter medications on file as of 09/21/2020.    Current Diagnosis: Patient Active Problem List   Diagnosis Date Noted  . Frequent falls 07/05/2020  . Arachnoid cyst 07/05/2020  . Aortic atherosclerosis ([place]) 05/03/2020  . Loculated pleural effusion 10/10/2019  . Anemia 10/10/2019  . Seborrheic keratosis 12/03/2016  . BPH with obstruction/lower urinary tract symptoms 04/03/2016  . Advance directive discussed with patient 03/28/2015  . Hyperlipidemia   . Diabetes  mellitus with neurological manifestations, controlled ([place]) 12/08/2013  . Routine health maintenance 11/26/2011  . 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 Presbycusis of both ears 11/01/2009  . COLONIC POLYPS, HX OF 03/15/2008    Goals Addressed   None     Follow-Up:  Coordination of [facility] Pharmacy Services   Pt has been seen in the emergency department or hospitalized 4 times over the last 12 months. 10/10/2019-near syncope complaint (admitted11/28/2020 discharged 10/16/2019) 06/26/2020-abdominal pain 06/27/2020-Weakness and fever with frequent falls 08/06/2020-Dizziness;Nausea and vomiting  Reviewed chart for recent medication changes: 05/05/2020 [place](PCP) discontinued Miralax(course complete)  08/06/2020 ED-given meclizine short term for vertigo

## 2020-09-22 ENCOUNTER — Ambulatory Visit: Payer: Medicare HMO

## 2020-09-22 ENCOUNTER — Other Ambulatory Visit: Payer: Self-pay

## 2020-09-22 DIAGNOSIS — E785 Hyperlipidemia, unspecified: Secondary | ICD-10-CM

## 2020-09-22 DIAGNOSIS — E1142 Type 2 diabetes mellitus with diabetic polyneuropathy: Secondary | ICD-10-CM

## 2020-09-22 NOTE — Chronic Care Management (AMB) (Signed)
Chronic Care Management Pharmacy  Name: Frank Carlson  MRN: [id number] DOB: December 27, 1940  Chief Complaint/ HPI  Frank Carlson,  79 y.o. , male presents for their Initial CCM visit with the clinical pharmacist via telephone.  PCP : Frank Carbon, MD  Their chronic conditions include: DM, BPH, HLD, aortic atherosclerosis, anemia   Office Visits:  07/05/20: PCP visit - frequent falls over past 3 days, recommend using a cane at all times, possible DM nephropathy, neruology consult to ensure arachnoid cyst does not require action  05/03/20: AWV - DM Checks sugars every 3 days, Usually in 120's---rarely 140's, Still on the metformin; continues statin; urine stream okay, some dribbling, no nocturia, continue current meds  Consult Visit:  08/06/20: ED - vertigo/nausea, given meclizine  CCM consent 04/08/20  Patient concerns: denies concerns, has been on simvastatin and metformin for 5+ years, no other prescription medications   Medications: Outpatient Encounter Medications as of 09/22/2020  Medication Sig   Calcium Carbonate-Vitamin D (CALCIUM-D PO) Take 1 tablet by mouth at bedtime.   glucose blood (TRUE METRIX BLOOD GLUCOSE TEST) test strip Use to test blood sugar once a day. Dx Code: E08.40   metFORMIN (GLUCOPHAGE) 500 MG tablet TAKE 1 TABLET TWO TIMES DAILY WITH A MEAL.   Multiple Vitamin (MULTIVITAMIN WITH MINERALS) TABS tablet Take 1 tablet by mouth every morning.    Omega-3 Fatty Acids (FISH OIL PO) Take 1 capsule by mouth every morning.    polyvinyl alcohol (ARTIFICIAL TEARS) 1.4 % ophthalmic solution Place 1 drop into both eyes 2 (two) times daily.   simvastatin (ZOCOR) 20 MG tablet TAKE 1 TABLET AT BEDTIME (Patient taking differently: Take 20 mg by mouth at bedtime. )   TRUEplus Lancets 30G MISC 1 Units by Does not apply route daily. Use to check blood sugar once a day. Dx Code E08.40   meclizine (ANTIVERT) 25 MG tablet Take 1 tablet (25 mg total) by mouth 3  (three) times daily as needed for dizziness. (Patient not taking: Reported on 09/22/2020)   No facility-administered encounter medications on file as of 09/22/2020.   Current Diagnosis/Assessment:  SDOH Interventions     Most Recent Value  SDOH Interventions  Financial Strain Interventions Intervention Not Indicated     Goals Addressed            This Visit's Progress    Pharmacy Care Plan       CARE PLAN ENTRY  Current Barriers:   Chronic Disease Management support, education, and care coordination needs related to Hyperlipidemia and Diabetes  Hyperlipidemia Lab Results  Component Value Date/Time   LDLCALC 72 05/03/2020 08:56 AM   LDLDIRECT 108.9 11/30/2013 09:55 AM   Pharmacist Clinical Goal(s): o Over the next 6 months, patient will work with PharmD and providers to maintain LDL goal < 70  Current regimen:   Simvastatin 20 mg - 1 tablet daily (bedtime)  Omega 3-fatty acids 525 mg - 1 capsule daily (morning)  Interventions: o Reviewed adherence and tolerance  Diabetes Lab Results  Component Value Date/Time   HGBA1C 5.9 05/03/2020 08:56 AM   HGBA1C 7.1 (H) 10/11/2019 02:35 AM    Pharmacist Clinical Goal(s): o Over the next 6 months, patient will work with PharmD and providers to maintain A1c goal <7%  Current regimen:  o Metformin 500 mg - 1 tablet twice daily  Interventions: o Reviewed home blood glucose log  Patient self care activities - Over the next 6 months, patient will: o Check blood  sugar once daily, document, and provide at future appointments o Contact provider with any episodes of hypoglycemia  Initial goal documentation       Diabetes   Lab Results  Component Value Date   CREATININE 0.90 08/06/2020   BUN 13 08/06/2020   GFR 72.52 05/03/2020   GFRNONAA >60 08/06/2020   GFRAA >60 08/06/2020   NA 139 08/06/2020   K 3.9 08/06/2020   CALCIUM 8.9 08/06/2020   CO2 23 08/06/2020   Recent Relevant Labs: Lab Results  Component  Value Date/Time   HGBA1C 5.9 05/03/2020 08:56 AM   HGBA1C 7.1 (H) 10/11/2019 02:35 AM   MICROALBUR <0.7 05/03/2020 08:56 AM   MICROALBUR <0.7 04/17/2017 11:42 AM    Checking BG: Daily (before breakfast) Recent FBG Readings:118, 111, 102, 130; averages 105-115, very rarely a 130 (highest 140) None <70  Patient has failed these meds in past: none  Patient is currently controlled on the following medications:   Metformin 500 mg - 1 tablet twice daily  Lab Results  Component Value Date/Time   HMDIABEYEEXA No Retinopathy 04/13/2019 12:00 AM    Lab Results  Component Value Date/Time   HMDIABFOOTEX done 05/03/2020 12:00 AM    We discussed: < 5 day gap between refills; doing very well, checks BG every day  Plan: Continue current medications  Hyperlipidemia   LDL goal < 70  Last lipids Lab Results  Component Value Date   CHOL 126 05/03/2020   HDL 29.90 (L) 05/03/2020   LDLCALC 72 05/03/2020   LDLDIRECT 108.9 11/30/2013   TRIG 117.0 05/03/2020   CHOLHDL 4 05/03/2020   Hepatic Function Latest Ref Rng & Units 08/06/2020 06/27/2020 06/26/2020  Total Protein 6.5 - 8.1 g/dL 7.1 6.9 7.7  Albumin 3.5 - 5.0 g/dL 3.7 3.5 4.0  AST 15 - 41 U/L 18 260(H) 280(H)  ALT 0 - 44 U/L 15 465(H) 145(H)  Alk Phosphatase 38 - 126 U/L 39 65 50  Total Bilirubin 0.3 - 1.2 mg/dL 0.8 1.2 1.2  Bilirubin, Direct 0.0 - 0.2 mg/dL - - -    The ASCVD Risk score [name], et al., 2013) failed to calculate for the following reasons:   The 2013 ASCVD risk score is only valid for ages 66 to 62  Patient has failed these meds in past: none reported Patient is currently controlled on the following medications:   Simvastatin 20 mg - 1 tablet daily (bedtime)  Omega 3-fatty acids 525 mg - 1 capsule daily (morning)  We discussed: < 5 day gap in refills, denies concerns   Plan: Continue current medications   Frequent falls: Denies any falls since last PCP appt, using cane as needed  Medication  Management:  OTCs:   AM - Equate complete multivitamin 1 daily, omega 3 525 mg 1 daily  PM - Calcium/vitamin D supplement 1 daily  Pharmacy/Benefits: [employer]  Adherence: < 5 day gap for metformin, statin  Affordability: Denies concerns   CCM Follow Up:  unenroll from CCM (< 4 maintenance medications)  Debbora Dus, PharmD Clinical Pharmacist [facility] [phone]

## 2020-09-22 NOTE — Patient Instructions (Addendum)
Dear Frank Carlson,  Below is a summary of the goals we discussed during our follow up appointment on September 22, 2020. Please contact me anytime with questions or concerns.   Visit Information  Goals Addressed            This Visit's Progress   . Pharmacy Care Plan       CARE PLAN ENTRY  Current Barriers:  . Chronic Disease Management support, education, and care coordination needs related to Hyperlipidemia and Diabetes  Hyperlipidemia Lab Results  Component Value Date/Time   LDLCALC 72 05/03/2020 08:56 AM   LDLDIRECT 108.9 11/30/2013 09:55 AM .  Pharmacist Clinical Goal(s): o Over the next 6 months, patient will work with PharmD and providers to maintain LDL goal < 70 . Current regimen:  . Simvastatin 20 mg - 1 tablet daily (bedtime) . Omega 3-fatty acids 525 mg - 1 capsule daily (morning) . Interventions: o Reviewed adherence and tolerance  Diabetes Lab Results  Component Value Date/Time   HGBA1C 5.9 05/03/2020 08:56 AM   HGBA1C 7.1 (H) 10/11/2019 02:35 AM   . Pharmacist Clinical Goal(s): o Over the next 6 months, patient will work with PharmD and providers to maintain A1c goal <7% . Current regimen:  o Metformin 500 mg - 1 tablet twice daily . Interventions: o Reviewed home blood glucose log . Patient self care activities - Over the next 6 months, patient will: o Check blood sugar once daily, document, and provide at future appointments o Contact provider with any episodes of hypoglycemia  Initial goal documentation       Patient verbalizes understanding of instructions provided today.  CCM enrollment status changed to "previously enrolled" as per patient request on 09/22/20 to discontinue enrollment. Case closed to case management services in primary care home.   Frank Carlson, PharmD Clinical Pharmacist [facility] [phone]   Frank Carlson stands for "Dietary Approaches to Stop Hypertension." The DASH eating  plan is a healthy eating plan that has been shown to reduce high blood pressure (hypertension). It may also reduce your risk for type 2 diabetes, heart disease, and stroke. The DASH eating plan may also help with weight loss. What are tips for following this plan?  General guidelines  Avoid eating more than 2,300 mg (milligrams) of salt (sodium) a day. If you have hypertension, you may need to reduce your sodium intake to 1,500 mg a day.  Limit alcohol intake to no more than 1 drink a day for nonpregnant women and 2 drinks a day for men. One drink equals 12 oz of beer, 5 oz of wine, or 1 oz of hard liquor.  Work with your health care provider to maintain a healthy body weight or to lose weight. Ask what an ideal weight is for you.  Get at least 30 minutes of exercise that causes your heart to beat faster (aerobic exercise) most days of the week. Activities may include walking, swimming, or biking.  Work with your health care provider or diet and nutrition specialist (dietitian) to adjust your eating plan to your individual calorie needs. Reading food labels   Check food labels for the amount of sodium per serving. Choose foods with less than 5 percent of the Daily Value of sodium. Generally, foods with less than 300 mg of sodium per serving fit into this eating plan.  To find whole grains, look for the word "whole" as the first word in the ingredient list. Shopping  Buy  products labeled as "low-sodium" or "no salt added."  Buy fresh foods. Avoid canned foods and premade or frozen meals. Cooking  Avoid adding salt when cooking. Use salt-free seasonings or herbs instead of table salt or sea salt. Check with your health care provider or pharmacist before using salt substitutes.  Do not fry foods. Cook foods using healthy methods such as baking, boiling, grilling, and broiling instead.  Cook with heart-healthy oils, such as olive, canola, soybean, or sunflower oil. Meal planning  Eat a  balanced diet that includes: ? 5 or more servings of fruits and vegetables each day. At each meal, try to fill half of your plate with fruits and vegetables. ? Up to 6-8 servings of whole grains each day. ? Less than 6 oz of lean meat, poultry, or fish each day. A 3-oz serving of meat is about the same size as a deck of cards. One egg equals 1 oz. ? 2 servings of low-fat dairy each day. ? A serving of nuts, seeds, or beans 5 times each week. ? Heart-healthy fats. Healthy fats called Omega-3 fatty acids are found in foods such as flaxseeds and coldwater fish, like sardines, salmon, and mackerel.  Limit how much you eat of the following: ? Canned or prepackaged foods. ? Food that is high in trans fat, such as fried foods. ? Food that is high in saturated fat, such as fatty meat. ? Sweets, desserts, sugary drinks, and other foods with added sugar. ? Full-fat dairy products.  Do not salt foods before eating.  Try to eat at least 2 vegetarian meals each week.  Eat more home-cooked food and less restaurant, buffet, and fast food.  When eating at a restaurant, ask that your food be prepared with less salt or no salt, if possible. What foods are recommended? The items listed may not be a complete list. Talk with your dietitian about what dietary choices are best for you. Grains Whole-grain or whole-wheat bread. Whole-grain or whole-wheat pasta. Brown rice. Frank Carlson. Bulgur. Whole-grain and low-sodium cereals. Pita bread. Low-fat, low-sodium crackers. Whole-wheat flour tortillas. Vegetables Fresh or frozen vegetables (raw, steamed, roasted, or grilled). Low-sodium or reduced-sodium tomato and vegetable juice. Low-sodium or reduced-sodium tomato sauce and tomato paste. Low-sodium or reduced-sodium canned vegetables. Fruits All fresh, dried, or frozen fruit. Canned fruit in natural juice (without added sugar). Meat and other protein foods Skinless chicken or Frank Carlson. Ground chicken or  Frank Carlson. Pork with fat trimmed off. Fish and seafood. Egg whites. Dried beans, peas, or lentils. Unsalted nuts, nut butters, and seeds. Unsalted canned beans. Lean cuts of beef with fat trimmed off. Low-sodium, lean deli meat. Dairy Low-fat (1%) or fat-free (skim) milk. Fat-free, low-fat, or reduced-fat cheeses. Nonfat, low-sodium ricotta or cottage cheese. Low-fat or nonfat yogurt. Low-fat, low-sodium cheese. Fats and oils Soft margarine without trans fats. Vegetable oil. Low-fat, reduced-fat, or light mayonnaise and salad dressings (reduced-sodium). Canola, safflower, olive, soybean, and sunflower oils. Avocado. Seasoning and other foods Herbs. Spices. Seasoning mixes without salt. Unsalted popcorn and pretzels. Fat-free sweets. What foods are not recommended? The items listed may not be a complete list. Talk with your dietitian about what dietary choices are best for you. Grains Baked goods made with fat, such as croissants, muffins, or some breads. Dry pasta or rice meal packs. Vegetables Creamed or fried vegetables. Vegetables in a cheese sauce. Regular canned vegetables (not low-sodium or reduced-sodium). Regular canned tomato sauce and paste (not low-sodium or reduced-sodium). Regular tomato and vegetable juice (not low-sodium  or reduced-sodium). Angie Fava. Olives. Fruits Canned fruit in a light or heavy syrup. Fried fruit. Fruit in cream or butter sauce. Meat and other protein foods Fatty cuts of meat. Ribs. Fried meat. Berniece Salines. Sausage. Bologna and other processed lunch meats. Salami. Fatback. Hotdogs. Bratwurst. Salted nuts and seeds. Canned beans with added salt. Canned or smoked fish. Whole eggs or egg yolks. Chicken or Frank Carlson with skin. Dairy Whole or 2% milk, cream, and half-and-half. Whole or full-fat cream cheese. Whole-fat or sweetened yogurt. Full-fat cheese. Nondairy creamers. Whipped toppings. Processed cheese and cheese spreads. Fats and oils Butter. Stick margarine. Lard.  Shortening. Ghee. Bacon fat. Tropical oils, such as coconut, palm kernel, or palm oil. Seasoning and other foods Salted popcorn and pretzels. Onion salt, garlic salt, seasoned salt, table salt, and sea salt. Worcestershire sauce. Tartar sauce. Barbecue sauce. Teriyaki sauce. Soy sauce, including reduced-sodium. Steak sauce. Canned and packaged gravies. Fish sauce. Oyster sauce. Cocktail sauce. Horseradish that you find on the shelf. Ketchup. Mustard. Meat flavorings and tenderizers. Bouillon cubes. Hot sauce and Tabasco sauce. Premade or packaged marinades. Premade or packaged taco seasonings. Relishes. Regular salad dressings. Where to find more information:  National Heart, Lung, and [place]: https://wilson-eaton.com/  American Heart Association: www.heart.org Summary  The DASH eating plan is a healthy eating plan that has been shown to reduce high blood pressure (hypertension). It may also reduce your risk for type 2 diabetes, heart disease, and stroke.  With the DASH eating plan, you should limit salt (sodium) intake to 2,300 mg a day. If you have hypertension, you may need to reduce your sodium intake to 1,500 mg a day.  When on the DASH eating plan, aim to eat more fresh fruits and vegetables, whole grains, lean proteins, low-fat dairy, and heart-healthy fats.  Work with your health care provider or diet and nutrition specialist (dietitian) to adjust your eating plan to your individual calorie needs. This information is not intended to replace advice given to you by your health care provider. Make sure you discuss any questions you have with your health care provider. Document Revised: 10/11/2017 Document Reviewed: 10/22/2016 Elsevier Patient Education  2020 [employer].

## 2020-09-23 ENCOUNTER — Telehealth: Payer: Medicare HMO

## 2020-11-01 ENCOUNTER — Encounter: Payer: Self-pay | Admitting: Internal Medicine

## 2020-11-01 ENCOUNTER — Other Ambulatory Visit: Payer: Self-pay

## 2020-11-01 ENCOUNTER — Ambulatory Visit (INDEPENDENT_AMBULATORY_CARE_PROVIDER_SITE_OTHER): Payer: Medicare HMO | Admitting: Internal Medicine

## 2020-11-01 VITALS — BP 104/76 | HR 63 | Temp 97.6°F | Ht 72.0 in | Wt 201.0 lb

## 2020-11-01 DIAGNOSIS — E1142 Type 2 diabetes mellitus with diabetic polyneuropathy: Secondary | ICD-10-CM

## 2020-11-01 LAB — POCT GLYCOSYLATED HEMOGLOBIN (HGB A1C): Hemoglobin A1C: 6.1 % — AB (ref 4.0–5.6)

## 2020-11-01 NOTE — Assessment & Plan Note (Signed)
Lab Results  Component Value Date   HGBA1C 6.1 (A) 11/01/2020   Still has excellent control on the metformin No changes needed Neuropathy affects balance (again discussed using walking stick) but no sig pain

## 2020-11-01 NOTE — Progress Notes (Signed)
Subjective:    Patient ID: Frank Carlson, male    DOB: 21-Dec-1940, 79 y.o.   MRN: [id number]  HPI Here for follow up of diabetes This visit occurred during the SARS-CoV-2 public health emergency.  Safety protocols were in place, including screening questions prior to the visit, additional usage of staff PPE, and extensive cleaning of exam room while observing appropriate contact time as indicated for disinfecting solutions.   Balance seems to be better He feels it improved once he cleaned cerumen out of his ears He does have a walking stick--but not using often He is careful now turning, etc  Checks sugars daily 105-125 fasting No hypoglycemic reactions Ongoing sensory loss in feet--no pain  Did see Dr Saintclair Halsted Apparently no action needed on the arachnoid cyst  Current Outpatient Medications on File Prior to Visit  Medication Sig Dispense Refill  . Calcium Carbonate-Vitamin D (CALCIUM-D PO) Take 1 tablet by mouth at bedtime.    Marland Kitchen glucose blood (TRUE METRIX BLOOD GLUCOSE TEST) test strip Use to test blood sugar once a day. Dx Code: E08.40 100 each 3  . metFORMIN (GLUCOPHAGE) 500 MG tablet TAKE 1 TABLET TWO TIMES DAILY WITH A MEAL. 180 tablet 3  . Multiple Vitamin (MULTIVITAMIN WITH MINERALS) TABS tablet Take 1 tablet by mouth every morning.     . Omega-3 Fatty Acids (FISH OIL PO) Take 1 capsule by mouth every morning.     . polyvinyl alcohol (LIQUIFILM TEARS) 1.4 % ophthalmic solution Place 1 drop into both eyes 2 (two) times daily.    . simvastatin (ZOCOR) 20 MG tablet TAKE 1 TABLET AT BEDTIME (Patient taking differently: Take 20 mg by mouth at bedtime.) 90 tablet 3  . TRUEplus Lancets 30G MISC 1 Units by Does not apply route daily. Use to check blood sugar once a day. Dx Code E08.40 100 each 3   No current facility-administered medications on file prior to visit.    Allergies  Allergen Reactions  . Bee Venom Other (See Comments)    Passed out    Past Medical History:   Diagnosis Date  . BPH (benign prostatic hypertrophy)   . COLONIC POLYPS, HX OF 03/15/2008  . 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 CONCUSSION WITH LOC OF 30 MINUTES OR LESS 11/01/2009  . Diabetes mellitus without complication ([place])    TYPE 2  . ED (erectile dysfunction)   . HEARING LOSS, BILATERAL 11/01/2009   Wears hearing aids  . Hyperlipidemia   . JOINT STIFFNESS, HAND 10/28/2008  . Pleural effusion 10/2019  . Pneumonia   . Type II or unspecified type diabetes mellitus with neurological manifestations, not stated as uncontrolled(250.60) 1/15   Type 2    Past Surgical History:  Procedure Laterality Date  . CATARACT EXTRACTION Bilateral 07/2010   OD with IOL  . COLONOSCOPY W/ POLYPECTOMY    . INCISION / DRAINAGE HAND / FINGER Right   . INGUINAL HERNIA REPAIR Right 08/23/2015   Procedure: LAPAROSCOPIC RIGHT INGUINAL HERNIA REPAIR WITH MESH;  Surgeon: Ralene Ok, MD;  Location: [facility];  Service: General;  Laterality: Right;  . INSERTION OF MESH Right 08/23/2015   Procedure: INSERTION OF MESH;  Surgeon: Ralene Ok, MD;  Location: [facility];  Service: General;  Laterality: Right;  . IR THORACENTESIS ASP PLEURAL SPACE W/IMG GUIDE  10/15/2019  . TONSILLECTOMY    . VASECTOMY      Family History  Problem Relation Age of Onset  . Alzheimer's disease Mother   . Dementia Mother   .  Coronary artery disease Father   . Heart disease Father   . Coronary artery disease Other   . Diabetes Other   . Alzheimer's disease Sister   . Dementia Brother   . Alzheimer's disease Brother     Social History   Socioeconomic History  . Marital status: Married    Spouse name: Lovey Newcomer  . Number of children: 3  . Years of education: 101  . Highest education level: Not on file  Occupational History  . Occupation: Therapist, occupational    Comment: Retired  Tobacco Use  . Smoking status: Former Smoker    Packs/day: 1.50    Years: 5.00    Pack years: 7.50    Types: Cigarettes    Start date:  1955    Quit date: 11/25/1958    Years since quitting: 61.9  . Smokeless tobacco: Never Used  Vaping Use  . Vaping Use: Never used  Substance and Sexual Activity  . Alcohol use: No  . Drug use: No  . Sexual activity: Yes    Partners: Female  Other Topics Concern  . Not on file  Social History Narrative   [employer]- Estate manager/land agent.    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    Married '64. 3 sons- '66, '68, '71; 5 grandchildren, 1 step g-dtr.       Has living will   Wife is health care POA--then son Mitzi Hansen   Would accept resuscitation attempts but no prolonged ventilatory support   Probably wouldn't want prolonged tube feeds   Social Determinants of Health   Financial Resource Strain: Low Risk   . Difficulty of Paying Living Expenses: Not hard at all  Food Insecurity: Not on file  Transportation Needs: Not on file  Physical Activity: Not on file  Stress: Not on file  Social Connections: Not on file  Intimate Partner Violence: Not on file   Review of Systems Appetite okay Weight stable Sleeps well    Objective:   Physical Exam Constitutional:      Appearance: Normal appearance.  Cardiovascular:     Rate and Rhythm: Normal rate and regular rhythm.     Pulses: Normal pulses.     Heart sounds: No murmur heard. No gallop.   Pulmonary:     Effort: Pulmonary effort is normal.     Breath sounds: Normal breath sounds. No wheezing or rales.  Musculoskeletal:     Cervical back: Neck supple.     Right lower leg: No edema.     Left lower leg: No edema.  Lymphadenopathy:     Cervical: No cervical adenopathy.  Neurological:     Mental Status: He is alert.  Psychiatric:        Mood and Affect: Mood normal.        Behavior: Behavior normal.            Assessment & Plan:

## 2020-12-14 IMAGING — CR DG CHEST 2V
2 series · 2 of 2 positions shown · non-contrast
Comparison: May 03, 2020

CLINICAL DATA: Fever

EXAM:
CHEST - 2 VIEW

[chest pa]
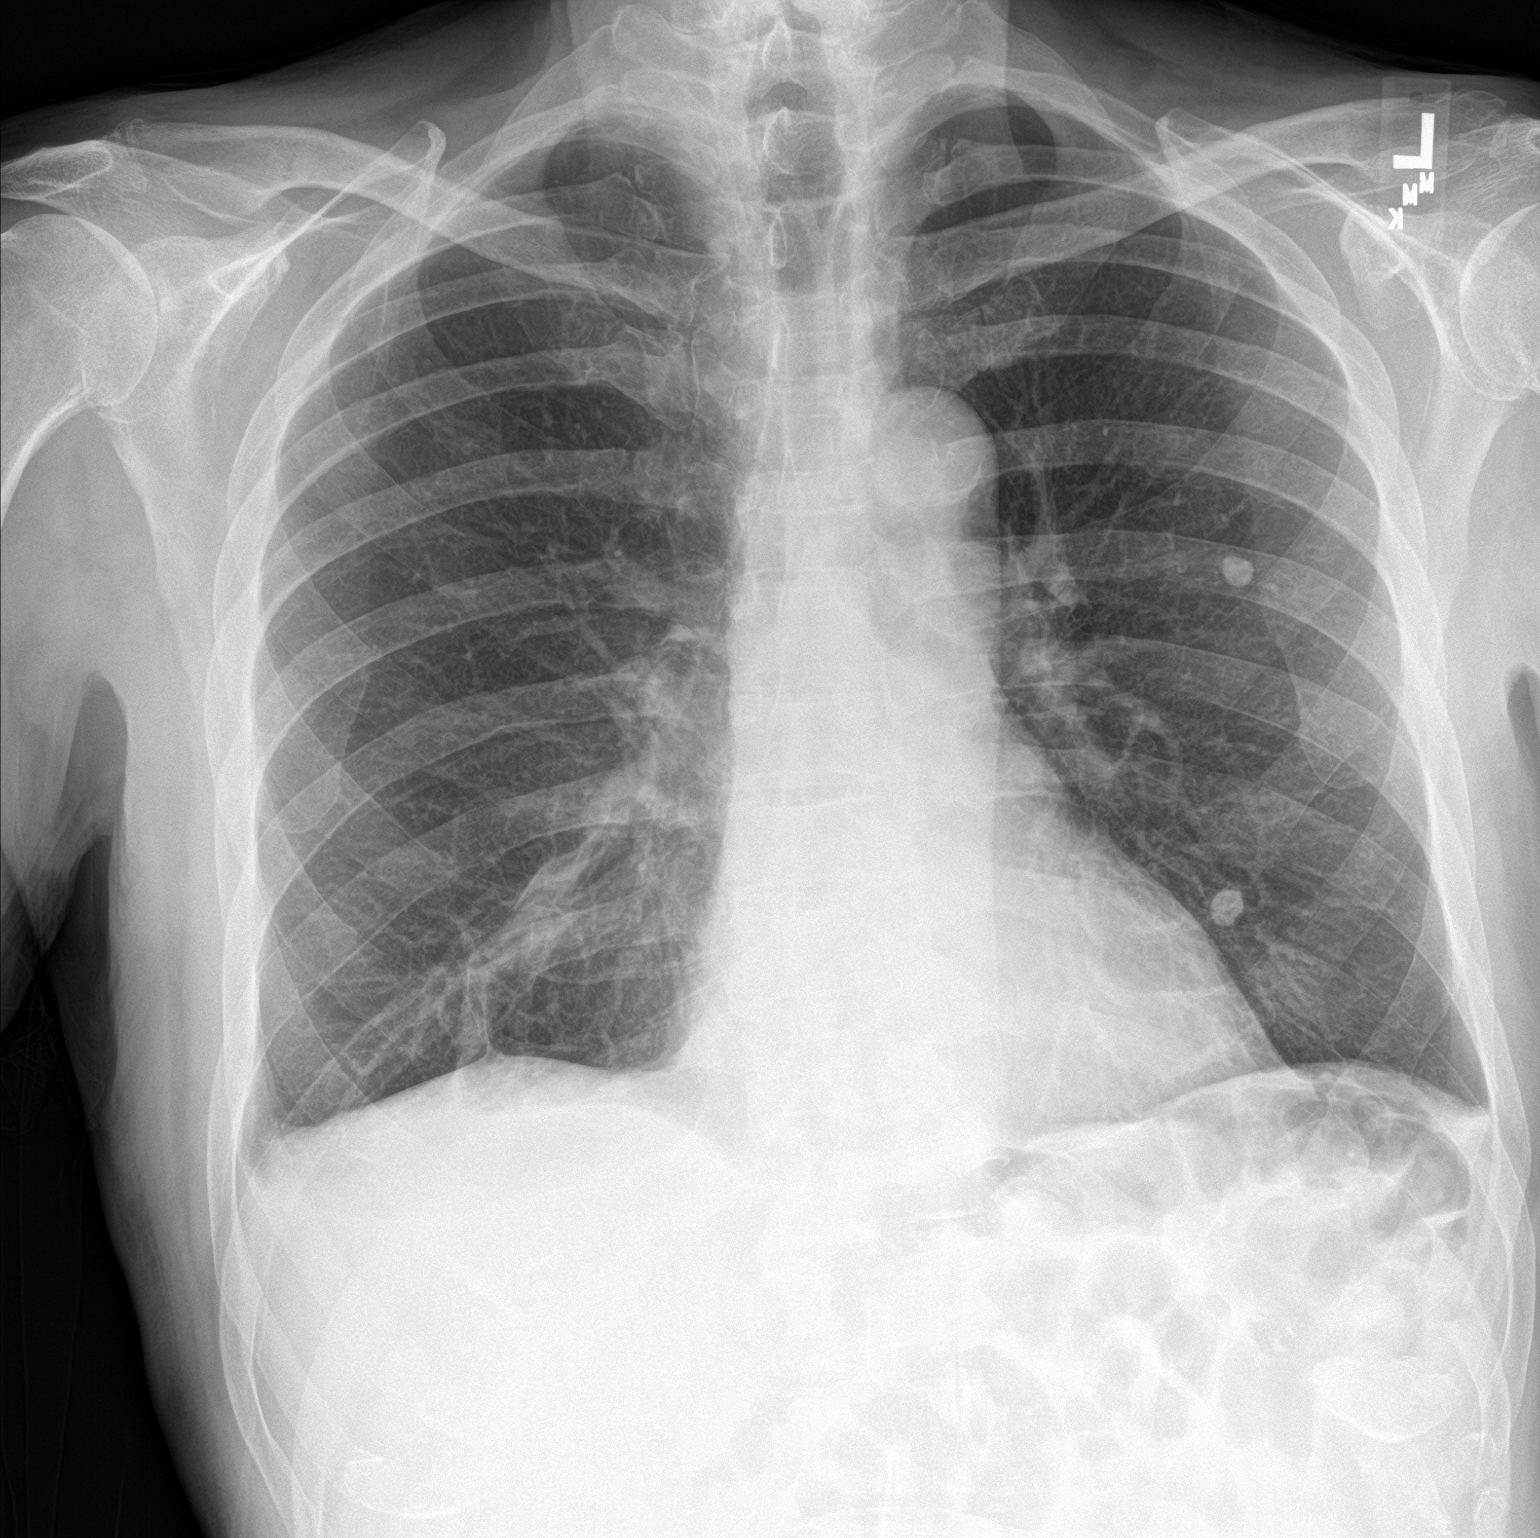

[chest lat]
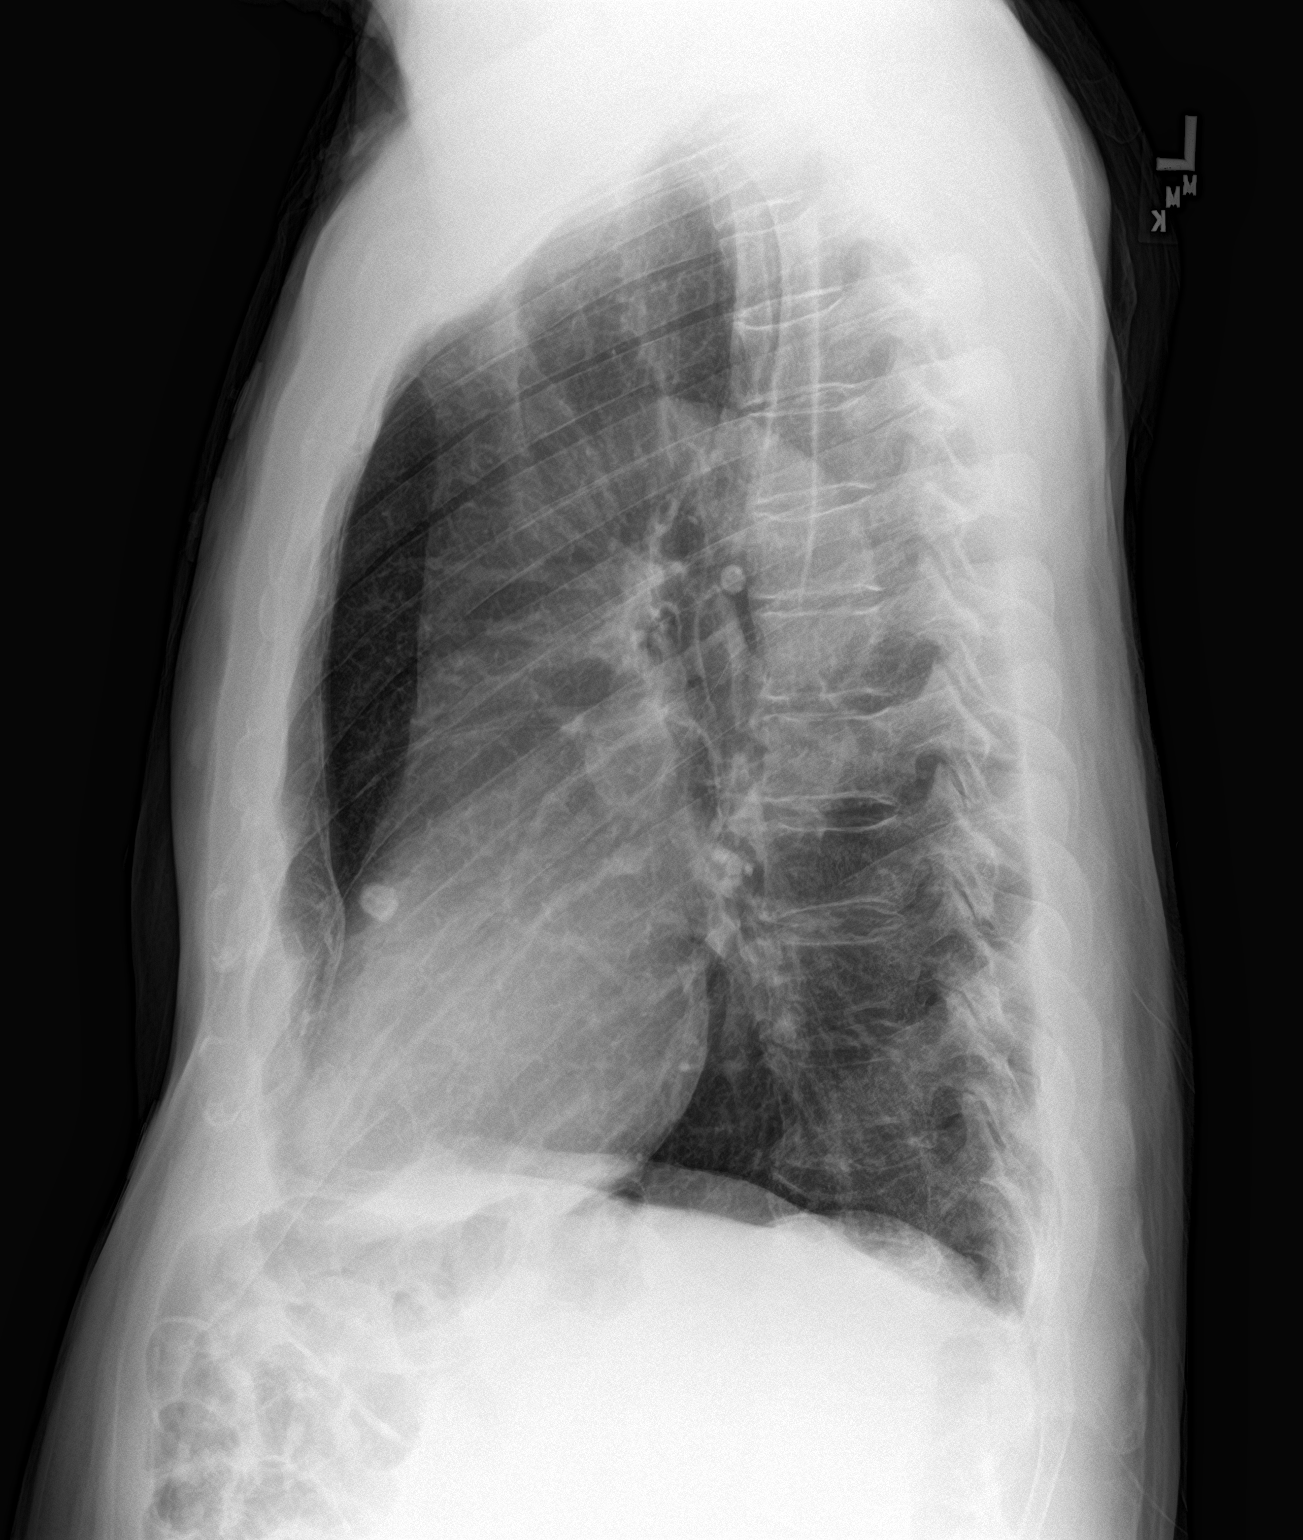

[2 of 2 positions shown; findings below may reference images not displayed]

FINDINGS: There are calcified granulomas on the left, stable. There is
scarring in the right lower lobe region. There is minimal scarring
in each lateral base region. Lungs elsewhere are clear. Heart size
and pulmonary vascularity normal. No adenopathy. There is aortic
atherosclerosis. No bone lesions. Apparent hiatal hernia.
IMPRESSION: Areas of scarring bilaterally, primarily in the right lower lobe.
Calcified granulomas on the left. No edema or airspace opacity.
Heart size normal. Apparent hiatal hernia.

Aortic Atherosclerosis ([id number]).

## 2021-03-16 ENCOUNTER — Other Ambulatory Visit: Payer: Self-pay | Admitting: Internal Medicine

## 2021-03-16 DIAGNOSIS — E7800 Pure hypercholesterolemia, unspecified: Secondary | ICD-10-CM

## 2021-04-11 ENCOUNTER — Telehealth (INDEPENDENT_AMBULATORY_CARE_PROVIDER_SITE_OTHER): Payer: Medicare HMO | Admitting: Family Medicine

## 2021-04-11 DIAGNOSIS — U071 COVID-19: Secondary | ICD-10-CM

## 2021-04-11 NOTE — Progress Notes (Signed)
Virtual Visit via Telephone Note  I connected with Frank Carlson on 04/11/21 at  5:00 PM EDT by telephone and verified that I am speaking with the correct person using two identifiers.   I discussed the limitations, risks, security and privacy concerns of performing an evaluation and management service by telephone and the availability of in person appointments. I also discussed with the patient that there may be a patient responsible charge related to this service. The patient expressed understanding and agreed to proceed.  Location patient: home, [place] Location provider: work or home office Participants present for the call: patient, provider, patient's wife Patient did not have a visit with me in the prior 7 days to address this/these issue(s).   History of Present Illness:  Acute telemedicine visit for possible covid: -Onset: about 10 days ago -Symptoms include: nasal congestion, pnd, cough -now he is feeling better -he thought this was allergies as symptoms are well, but then wife had same symptoms and tested positive for covid -Denies: fevers, CP, SOB, NVD, inability to eat/drink/get out of bed -Pertinent past medical history: DM, HLD, aortic atherosclerosis -Pertinent medication allergies: Allergies  Allergen Reactions  . Bee Venom Other (See Comments)    Passed out  -COVID-19 vaccine status: fully vaccinated and boosted x2   Observations/Objective: Patient sounds cheerful and well on the phone. I do not appreciate any SOB. Speech and thought processing are grossly intact. Patient reported vitals:  Assessment and Plan:  COVID-19  Suspect covid19 most likely vs other. Advised he could do a covid test, but may not be positive at this point. Discussed treatment options, ideal treatment window, potential complications, isolation and precautions for COVID-19. The patient opted for nasal saline. He feels he is doing better now. Other symptomatic care measures summarized in  patient instructions.  Advised to seek prompt in person care if worsening, new symptoms arise, or if is not improving with treatment. Advised of options for inperson care in case PCP office not available. Did let the patient know that I only do telemedicine shifts for [facility] on Tuesdays and Thursdays and advised a follow up visit with PCP or at an [facility] if has further questions or concerns.   Follow Up Instructions:  I did not refer this patient for an OV with me in the next 24 hours for this/these issue(s).  I discussed the assessment and treatment plan with the patient. The patient was provided an opportunity to ask questions and all were answered. The patient agreed with the plan and demonstrated an understanding of the instructions.   I spent 12 minutes on the date of this visit in the care of this patient. See summary of tasks completed to properly care for this patient in the detailed notes above which also included counseling of above, review of PMH, medications, allergies, evaluation of the patient and ordering and/or  instructing patient on testing and care options.     Lucretia Kern, DO

## 2021-04-11 NOTE — Patient Instructions (Signed)
  HOME CARE TIPS:  -[place] testing information: [email or web address].[email or web address]/ OR [phone] Most pharmacies also offer testing and home test kits. If the Covid19 test is positive, please make a prompt follow up visit with your primary care office or with [facility] to discuss treatment options. Treatments for Covid19 are best given early in the course of the illness.   -can use tylenol  if needed for fevers, aches and pains per instructions  -can use nasal saline a few times per day if you have nasal congestion; sometimes  a short course of Afrin nasal spray for 3 days can help with symptoms as well  -stay hydrated, drink plenty of fluids and eat small healthy meals - avoid dairy  -can take 1000 IU (74mcg) Vit D3 and 100-500 mg of Vit C daily per instructions  -If the Covid test is positive, check out the [facility] website for more information on home care, transmission and treatment for COVID19  -follow up with your doctor in 2-3 days unless improving and feeling better  -stay home while sick, except to seek medical care. If you have COVID19, ideally it would be best to stay home for a full 10 days since the onset of symptoms PLUS one day of no fever and feeling better. Wear a good mask that fits snugly (such as N95 or KN95) if around others to reduce the risk of transmission.  It was nice to meet you today, and I really hope you are feeling better soon. I help [place] [facility] out with telemedicine visits on Tuesdays and Thursdays and am available for visits on those days. If you have any concerns or questions following this visit please schedule a follow up visit with your Primary Care doctor or seek care at a local urgent care clinic to avoid delays in care.    Seek in person care or schedule a follow up video visit promptly if your symptoms worsen, new concerns arise or you are not improving with treatment. Call 911 and/or seek emergency care if your  symptoms are severe or life threatening.

## 2021-05-09 ENCOUNTER — Encounter: Payer: Self-pay | Admitting: Internal Medicine

## 2021-05-09 ENCOUNTER — Other Ambulatory Visit: Payer: Self-pay

## 2021-05-09 ENCOUNTER — Ambulatory Visit (INDEPENDENT_AMBULATORY_CARE_PROVIDER_SITE_OTHER): Payer: Medicare HMO | Admitting: Internal Medicine

## 2021-05-09 VITALS — BP 106/74 | HR 58 | Temp 97.9°F | Ht 71.75 in | Wt 193.0 lb

## 2021-05-09 DIAGNOSIS — Z7189 Other specified counseling: Secondary | ICD-10-CM

## 2021-05-09 DIAGNOSIS — E0840 Diabetes mellitus due to underlying condition with diabetic neuropathy, unspecified: Secondary | ICD-10-CM | POA: Diagnosis not present

## 2021-05-09 DIAGNOSIS — I700 Atherosclerosis of aorta: Secondary | ICD-10-CM

## 2021-05-09 DIAGNOSIS — N401 Enlarged prostate with lower urinary tract symptoms: Secondary | ICD-10-CM | POA: Diagnosis not present

## 2021-05-09 DIAGNOSIS — E785 Hyperlipidemia, unspecified: Secondary | ICD-10-CM | POA: Diagnosis not present

## 2021-05-09 DIAGNOSIS — Z0000 Encounter for general adult medical examination without abnormal findings: Secondary | ICD-10-CM | POA: Diagnosis not present

## 2021-05-09 DIAGNOSIS — E1149 Type 2 diabetes mellitus with other diabetic neurological complication: Secondary | ICD-10-CM

## 2021-05-09 DIAGNOSIS — N138 Other obstructive and reflux uropathy: Secondary | ICD-10-CM | POA: Diagnosis not present

## 2021-05-09 LAB — HEMOGLOBIN A1C: Hgb A1c MFr Bld: 7 % — ABNORMAL HIGH (ref 4.6–6.5)

## 2021-05-09 LAB — LIPID PANEL
Cholesterol: 119 mg/dL (ref 0–200)
HDL: 27.1 mg/dL — ABNORMAL LOW (ref >39.00)
LDL Cholesterol: 76 mg/dL (ref 0–99)
NonHDL: 92.34
Total CHOL/HDL Ratio: 4
Triglycerides: 83 mg/dL (ref 0.0–149.0)
VLDL: 16.6 mg/dL (ref 0.0–40.0)

## 2021-05-09 LAB — CBC
HCT: 39.1 % (ref 39.0–52.0)
Hemoglobin: 13.1 g/dL (ref 13.0–17.0)
MCHC: 33.5 g/dL (ref 30.0–36.0)
MCV: 86.2 fl (ref 78.0–100.0)
Platelets: 205 10*3/uL (ref 150.0–400.0)
RBC: 4.53 Mil/uL (ref 4.22–5.81)
RDW: 14.6 % (ref 11.5–15.5)
WBC: 6.6 10*3/uL (ref 4.0–10.5)

## 2021-05-09 LAB — COMPREHENSIVE METABOLIC PANEL
ALT: 12 U/L (ref 0–53)
AST: 13 U/L (ref 0–37)
Albumin: 4 g/dL (ref 3.5–5.2)
Alkaline Phosphatase: 47 U/L (ref 39–117)
BUN: 16 mg/dL (ref 6–23)
CO2: 30 mEq/L (ref 19–32)
Calcium: 9.6 mg/dL (ref 8.4–10.5)
Chloride: 105 mEq/L (ref 96–112)
Creatinine, Ser: 1.33 mg/dL (ref 0.40–1.50)
GFR: 49.89 mL/min — ABNORMAL LOW (ref >60.00)
Glucose, Bld: 122 mg/dL — ABNORMAL HIGH (ref 70–99)
Potassium: 4.1 mEq/L (ref 3.5–5.1)
Sodium: 141 mEq/L (ref 135–145)
Total Bilirubin: 0.4 mg/dL (ref 0.2–1.2)
Total Protein: 7.6 g/dL (ref 6.0–8.3)

## 2021-05-09 LAB — HM DIABETES FOOT EXAM

## 2021-05-09 MED ORDER — TAMSULOSIN HCL 0.4 MG PO CAPS: 0.4000 mg | ORAL_CAPSULE | Freq: Every day | ORAL | 3 refills | Status: DC

## 2021-05-09 NOTE — Progress Notes (Signed)
Hearing Screening - Comments:: Has hearing aids. Wearing them today. Vision Screening - Comments:: June 2021

## 2021-05-09 NOTE — Assessment & Plan Note (Signed)
Having trouble with emptying Will start tamsulosin

## 2021-05-09 NOTE — Assessment & Plan Note (Signed)
See social history 

## 2021-05-09 NOTE — Assessment & Plan Note (Addendum)
Seems to still have good control Foot numbness but no sig pain Continues on the metformin Needs eye exam

## 2021-05-09 NOTE — Progress Notes (Signed)
Subjective:    Patient ID: Frank Carlson, male    DOB: [birth year], 80 y.o.   MRN: [id number]  HPI Here for Medicare wellness visit and follow up of chronic health conditions This visit occurred during the SARS-CoV-2 public health emergency.  Safety protocols were in place, including screening questions prior to the visit, additional usage of staff PPE, and extensive cleaning of exam room while observing appropriate contact time as indicated for disinfecting solutions.   Reviewed form and advanced directives Reviewed other doctors No alcohol or tobacco Not exercising lately---stays active with his yard. [facility] due [facility] Had falls last fall---but none since Vision is okay Hearing aides--needs readjusting No depression or anhedonia Independent with instrumental ADLs Memory is not as good---no loss of function  Did see the neurosurgeon about the arachnoid cyst He didn't think it was not an issue No falls since then Has walking stick--but not using it  Checking sugars every morning---usually 105-130 No problems with metformin Ongoing sensory loss in feet--but not painful  Known aortic atherosclerosis on CT scan No chest pain or SOB No dizziness or syncope No edema  Urinary stream is slow--has to go back to empty in a few minutes Nocturia x 2 Very irritating  Current Outpatient Medications on File Prior to Visit  Medication Sig Dispense Refill   Calcium Carbonate-Vitamin D (CALCIUM-D PO) Take 1 tablet by mouth at bedtime.     glucose blood (TRUE METRIX BLOOD GLUCOSE TEST) test strip Use to test blood sugar once a day. Dx Code: E08.40 100 each 3   metFORMIN (GLUCOPHAGE) 500 MG tablet TAKE 1 TABLET TWO TIMES DAILY WITH A MEAL. 180 tablet 3   Multiple Vitamin (MULTIVITAMIN WITH MINERALS) TABS tablet Take 1 tablet by mouth every morning.      Omega-3 Fatty Acids (FISH OIL PO) Take 1 capsule by mouth every morning.      polyvinyl alcohol (LIQUIFILM TEARS) 1.4 %  ophthalmic solution Place 1 drop into both eyes 2 (two) times daily.     simvastatin (ZOCOR) 20 MG tablet TAKE 1 TABLET AT BEDTIME 90 tablet 0   TRUEplus Lancets 30G MISC 1 Units by Does not apply route daily. Use to check blood sugar once a day. Dx Code E08.40 100 each 3   No current facility-administered medications on file prior to visit.    Allergies  Allergen Reactions   Bee Venom Other (See Comments)    Passed out    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   CONCUSSION WITH LOC OF 30 MINUTES OR LESS 11/01/2009   Diabetes mellitus without complication ([place])    TYPE 2   ED (erectile dysfunction)    HEARING LOSS, BILATERAL 11/01/2009   Wears hearing aids   Hyperlipidemia    JOINT STIFFNESS, HAND 10/28/2008   Pleural effusion 10/2019   Pneumonia    Type II or unspecified type diabetes mellitus with neurological manifestations, not stated as uncontrolled(250.60) 1/15   Type 2    Past Surgical History:  Procedure Laterality Date   CATARACT EXTRACTION Bilateral 07/2010   OD with IOL   COLONOSCOPY W/ POLYPECTOMY     INCISION / DRAINAGE HAND / FINGER Right    INGUINAL HERNIA REPAIR Right 08/23/2015   Procedure: LAPAROSCOPIC RIGHT INGUINAL HERNIA REPAIR WITH MESH;  Surgeon: Ralene Ok, MD;  Location: [facility];  Service: General;  Laterality: Right;   INSERTION OF MESH Right 08/23/2015   Procedure: INSERTION OF MESH;  Surgeon: Ralene Ok, MD;  Location: [facility];  Service: General;  Laterality: Right;   IR THORACENTESIS ASP PLEURAL SPACE W/IMG GUIDE  10/15/2019   TONSILLECTOMY     VASECTOMY      Family History  Problem Relation Age of Onset   Alzheimer's disease Mother    Dementia Mother    Coronary artery disease Father    Heart disease Father    Coronary artery disease Other    Diabetes Other    Alzheimer's disease Sister    Dementia Brother    Alzheimer's disease  Brother     Social History   Socioeconomic History   Marital status: Married    Spouse name: Lovey Newcomer   Number of children: 3   Years of education: 16   Highest education level: Not on file  Occupational History   Occupation: Therapist, occupational    Comment: Retired  Tobacco Use   Smoking status: Former    Packs/day: 1.50    Years: 5.00    Pack years: 7.50    Types: Cigarettes    Start date: 1955    Quit date: 11/25/1958    Years since quitting: 62.4   Smokeless tobacco: Never  Vaping Use   Vaping Use: Never used  Substance and Sexual Activity   Alcohol use: No   Drug use: No   Sexual activity: Yes    Partners: Female  Other Topics Concern   Not on file  Social History Narrative   [employer]- Estate manager/land agent.    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    Married '64. 3 sons- '66, '68, '71; 5 grandchildren, 1 step g-dtr.       Has living will   Wife is health care POA--then son Mitzi Hansen   Would accept resuscitation attempts but no prolonged ventilatory support   Probably wouldn't want prolonged tube feeds   Social Determinants of Health   Financial Resource Strain: Low Risk    Difficulty of Paying Living Expenses: Not hard at all  Food Insecurity: Not on file  Transportation Needs: Not on file  Physical Activity: Not on file  Stress: Not on file  Social Connections: Not on file  Intimate Partner Violence: Not on file   Review of Systems Appetite is okay Weight is back down a bit Sleeps fine Wears seat belt  Teeth okay ---keeps up with dentist No suspicious skin lesions No heartburn or dysphagia Bowels move okay---no blood Some joint/back aching---like with cleaning yard of debris    Objective:   Physical Exam Constitutional:      Appearance: Normal appearance.  HENT:     Mouth/Throat:     Comments: No lesions Eyes:     Conjunctiva/sclera: Conjunctivae normal.     Pupils: Pupils are equal, round, and reactive to light.   Cardiovascular:     Rate and Rhythm: Normal rate and regular rhythm.     Pulses: Normal pulses.     Heart sounds: No murmur heard.   No gallop.  Pulmonary:     Effort: Pulmonary effort is normal.     Breath sounds: Normal breath sounds. No wheezing or rales.  Abdominal:     Palpations: Abdomen is soft.     Tenderness: There is no abdominal tenderness.  Musculoskeletal:     Cervical back: Neck supple.     Right lower leg: No edema.     Left lower leg:  No edema.  Lymphadenopathy:     Cervical: No cervical adenopathy.  Skin:    General: Skin is warm.     Findings: No rash.     Comments: No foot lesions  Neurological:     Mental Status: He is alert and oriented to person, place, and time.     Comments: President--- "Biden, Trump, Obama" [id number] D-l-[id number]-w Recall 3/3  Decreased sensation in feet  Psychiatric:        Mood and Affect: Mood normal.        Behavior: Behavior normal.           Assessment & Plan:

## 2021-05-09 NOTE — Assessment & Plan Note (Signed)
I have personally reviewed the Medicare Annual Wellness questionnaire and have noted 1. The patient's medical and social history 2. Their use of alcohol, tobacco or illicit drugs 3. Their current medications and supplements 4. The patient's functional ability including ADL's, fall risks, home safety risks and hearing or visual             impairment. 5. Diet and physical activities 6. Evidence for depression or mood disorders  The patients weight, height, BMI and visual acuity have been recorded in the chart I have made referrals, counseling and provided education to the patient based review of the above and I have provided the pt with a written personalized care plan for preventive services.  I have provided you with a copy of your personalized plan for preventive services. Please take the time to review along with your updated medication list.  No cancer screening due to age Had second COVID booster  Flu vaccine in the fall Consider shingrix---had the older one Needs to pick up on exercise--especially resistance

## 2021-05-09 NOTE — Assessment & Plan Note (Signed)
On simvastatin.   

## 2021-05-09 NOTE — Assessment & Plan Note (Signed)
On CT scan On statin

## 2021-05-12 DIAGNOSIS — E119 Type 2 diabetes mellitus without complications: Secondary | ICD-10-CM | POA: Diagnosis not present

## 2021-05-12 LAB — HM DIABETES EYE EXAM

## 2021-05-24 ENCOUNTER — Telehealth: Payer: Self-pay

## 2021-05-24 MED ORDER — TAMSULOSIN HCL 0.4 MG PO CAPS: 0.4000 mg | ORAL_CAPSULE | Freq: Every day | ORAL | 3 refills | Status: DC

## 2021-05-24 NOTE — Telephone Encounter (Signed)
Rx sent electronically.  

## 2021-06-09 ENCOUNTER — Telehealth: Payer: Self-pay

## 2021-06-09 DIAGNOSIS — E7800 Pure hypercholesterolemia, unspecified: Secondary | ICD-10-CM

## 2021-06-09 MED ORDER — SIMVASTATIN 20 MG PO TABS: 20.0000 mg | ORAL_TABLET | Freq: Every day | ORAL | 3 refills | Status: DC

## 2021-06-09 NOTE — Telephone Encounter (Signed)
[place] Night - Client Nonclinical Telephone Record AccessNurse Client [facility] Night - Client Client Site [place] Physician Viviana Simpler- MD Contact Type Call Who Is Calling Patient / Member / Family / Caregiver Caller Name Zayon Dommer Caller Phone Number [phone] Patient Name Tyshan Zingsheim Patient DOB [birth year] Call Type Message Only Information Provided Reason for Call Medication Question / Request Initial Comment Caller states he received a message from a pharmacy that his prescription refill has run out. He needs it either renewed or cancelled. The medication is Simvastatin. Additional Comment Office information provided; declined triage. Disp. Time Disposition Final User 06/09/2021 7:53:20 AM General Information Provided Yes Lacretia Nicks Call Closed By: Lacretia Nicks Transaction Date/Time: 06/09/2021 7:50:13 AM (ET)

## 2021-06-09 NOTE — Telephone Encounter (Signed)
Called pt to verify pharmacy and sent med in.

## 2021-07-21 ENCOUNTER — Telehealth: Payer: Self-pay

## 2021-07-21 NOTE — Telephone Encounter (Signed)
[place] Night - Client Nonclinical Telephone Record AccessNurse Client [facility] Night - Client Client Site [place] - Night Physician [facility] - PHYSICIAN, Verita Schneiders- MD Contact Type Call Who Is Calling Patient / Member / Family / Caregiver Caller Name Donevin Wehrenberg Caller Phone Number [phone] Patient Name Mayson Lonie Patient DOB Jun 11, 1941 Call Type Message Only Information Provided Reason for Call Request for General Office Information Initial Comment Caller and her husband are pts of office - Dr. Diona Browner and Silvio Pate. She is updating the office with their only current working phone # is [phone] Additional Comment Katharine Look DOB. [phone]. No sxs - no triage. Just wants to keep office updated with only # to reach them. Asks for new office address - advised to check back with [place] early next week for updated info. Advised to call back. Disp. Time Disposition Final User 07/21/2021 9:19:12 AM General Information Provided Yes Gokounous, Erin Call Closed By: Candy Sledge Transaction Date/Time: 07/21/2021 9:12:55 AM (ET)

## 2021-07-24 NOTE — Telephone Encounter (Signed)
Numbers have been changed

## 2021-08-09 ENCOUNTER — Other Ambulatory Visit: Payer: Self-pay | Admitting: Internal Medicine

## 2021-10-30 ENCOUNTER — Other Ambulatory Visit: Payer: Self-pay | Admitting: Internal Medicine

## 2021-11-14 ENCOUNTER — Other Ambulatory Visit: Payer: Self-pay

## 2021-11-14 ENCOUNTER — Ambulatory Visit (INDEPENDENT_AMBULATORY_CARE_PROVIDER_SITE_OTHER): Payer: Medicare HMO | Admitting: Internal Medicine

## 2021-11-14 ENCOUNTER — Encounter: Payer: Self-pay | Admitting: Internal Medicine

## 2021-11-14 VITALS — BP 116/70 | HR 66 | Temp 97.9°F | Ht 72.0 in | Wt 195.0 lb

## 2021-11-14 DIAGNOSIS — I700 Atherosclerosis of aorta: Secondary | ICD-10-CM | POA: Diagnosis not present

## 2021-11-14 DIAGNOSIS — N1831 Chronic kidney disease, stage 3a: Secondary | ICD-10-CM | POA: Diagnosis not present

## 2021-11-14 DIAGNOSIS — E1149 Type 2 diabetes mellitus with other diabetic neurological complication: Secondary | ICD-10-CM | POA: Diagnosis not present

## 2021-11-14 DIAGNOSIS — N401 Enlarged prostate with lower urinary tract symptoms: Secondary | ICD-10-CM | POA: Diagnosis not present

## 2021-11-14 DIAGNOSIS — N138 Other obstructive and reflux uropathy: Secondary | ICD-10-CM

## 2021-11-14 DIAGNOSIS — E0840 Diabetes mellitus due to underlying condition with diabetic neuropathy, unspecified: Secondary | ICD-10-CM

## 2021-11-14 LAB — RENAL FUNCTION PANEL
Albumin: 3.8 g/dL (ref 3.5–5.2)
BUN: 20 mg/dL (ref 6–23)
CO2: 29 mEq/L (ref 19–32)
Calcium: 9.1 mg/dL (ref 8.4–10.5)
Chloride: 104 mEq/L (ref 96–112)
Creatinine, Ser: 1.1 mg/dL (ref 0.40–1.50)
GFR: 62.43 mL/min (ref >60.00)
Glucose, Bld: 141 mg/dL — ABNORMAL HIGH (ref 70–99)
Phosphorus: 3.1 mg/dL (ref 2.3–4.6)
Potassium: 4.2 mEq/L (ref 3.5–5.1)
Sodium: 142 mEq/L (ref 135–145)

## 2021-11-14 LAB — POCT GLYCOSYLATED HEMOGLOBIN (HGB A1C): Hemoglobin A1C: 6.8 % — AB (ref 4.0–5.6)

## 2021-11-14 NOTE — Assessment & Plan Note (Signed)
On imaging Is on statin 

## 2021-11-14 NOTE — Assessment & Plan Note (Signed)
Lab Results  Component Value Date   HGBA1C 6.8 (A) 11/14/2021   Still with good control on only metformin Mild foot/lower calf numbness but no burning--no Rx needed

## 2021-11-14 NOTE — Progress Notes (Signed)
Subjective:    Patient ID: Frank Carlson, male    DOB: Aug 02, 1941, 81 y.o.   MRN: [id number]  HPI Here for follow up of diabetes and other chronic health conditions  Doing okay He did try the flomax---but didn't tolerate it (it did work great for 2 weeks, but then "I just couldn't stop". Had urgency and some incontinence. Feels that he still has urgency----very different feeling now.  Has had some incontinence due to this---uses pad on his usual chair for this He feels he is emptying better  Reviewed his last GFR---49 Will recheck today  Checks sugars daily They vary but generally ~120 Only on metformin  Continues on the statin Does have aortic atherosclerosis  Current Outpatient Medications on File Prior to Visit  Medication Sig Dispense Refill   Calcium Carbonate-Vitamin D (CALCIUM-D PO) Take 1 tablet by mouth at bedtime.     glucose blood (TRUE METRIX BLOOD GLUCOSE TEST) test strip TEST BLOOD SUGAR EVERY DAY. Dx Code E11.49 100 strip 4   metFORMIN (GLUCOPHAGE) 500 MG tablet TAKE 1 TABLET TWO TIMES DAILY WITH A MEAL. 180 tablet 3   Multiple Vitamin (MULTIVITAMIN WITH MINERALS) TABS tablet Take 1 tablet by mouth every morning.      Omega-3 Fatty Acids (FISH OIL PO) Take 1 capsule by mouth every morning.      polyvinyl alcohol (LIQUIFILM TEARS) 1.4 % ophthalmic solution Place 1 drop into both eyes 2 (two) times daily.     simvastatin (ZOCOR) 20 MG tablet Take 1 tablet (20 mg total) by mouth at bedtime. 90 tablet 3   TRUEplus Lancets 30G MISC TEST BLOOD SUGAR EVERY DAY 100 each 3   No current facility-administered medications on file prior to visit.    Allergies  Allergen Reactions   Bee Venom Other (See Comments)    Passed out    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   CONCUSSION WITH LOC OF 30 MINUTES OR LESS 11/01/2009   Diabetes mellitus without complication  ([place])    TYPE 2   ED (erectile dysfunction)    HEARING LOSS, BILATERAL 11/01/2009   Wears hearing aids   Hyperlipidemia    JOINT STIFFNESS, HAND 10/28/2008   Pleural effusion 10/2019   Pneumonia    Type II or unspecified type diabetes mellitus with neurological manifestations, not stated as uncontrolled(250.60) 1/15   Type 2    Past Surgical History:  Procedure Laterality Date   CATARACT EXTRACTION Bilateral 07/2010   OD with IOL   COLONOSCOPY W/ POLYPECTOMY     INCISION / DRAINAGE HAND / FINGER Right    INGUINAL HERNIA REPAIR Right 08/23/2015   Procedure: LAPAROSCOPIC RIGHT INGUINAL HERNIA REPAIR WITH MESH;  Surgeon: Ralene Ok, MD;  Location: [place];  Service: General;  Laterality: Right;   INSERTION OF MESH Right 08/23/2015   Procedure: INSERTION OF MESH;  Surgeon: Ralene Ok, MD;  Location: [place];  Service: General;  Laterality: Right;   IR THORACENTESIS ASP PLEURAL SPACE W/IMG GUIDE  10/15/2019   TONSILLECTOMY     VASECTOMY      Family History  Problem Relation Age of Onset   Alzheimer's disease Mother    Dementia Mother    Coronary artery disease Father    Heart disease Father    Coronary artery disease Other    Diabetes Other    Alzheimer's disease Sister  Dementia Brother    Alzheimer's disease Brother     Social History   Socioeconomic History   Marital status: Married    Spouse name: Lovey Newcomer   Number of children: 3   Years of education: 16   Highest education level: Not on file  Occupational History   Occupation: Therapist, occupational    Comment: Retired  Tobacco Use   Smoking status: Former    Packs/day: 1.50    Years: 5.00    Pack years: 7.50    Types: Cigarettes    Start date: 1955    Quit date: 11/25/1958    Years since quitting: 63.0   Smokeless tobacco: Never  Vaping Use   Vaping Use: Never used  Substance and Sexual Activity   Alcohol use: No   Drug use: No   Sexual activity: Yes    Partners: Female  Other Topics  Concern   Not on file  Social History Narrative   [employer]- Estate manager/land agent.    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    Married '64. 3 sons- '66, '68, '71; 5 grandchildren, 1 step g-dtr.       Has living will   Wife is health care POA--then son Mitzi Hansen   Would accept resuscitation attempts but no prolonged ventilatory support   Probably wouldn't want prolonged tube feeds   Social Determinants of Health   Financial Resource Strain: Not on file  Food Insecurity: Not on file  Transportation Needs: Not on file  Physical Activity: Not on file  Stress: Not on file  Social Connections: Not on file  Intimate Partner Violence: Not on file   Review of Systems Appetite is good Weight is stable No chest pain or SOB     Objective:   Physical Exam Constitutional:      Appearance: Normal appearance.  Cardiovascular:     Rate and Rhythm: Normal rate and regular rhythm.     Pulses: Normal pulses.     Heart sounds: No murmur heard.   No gallop.  Pulmonary:     Effort: Pulmonary effort is normal.     Breath sounds: Normal breath sounds. No wheezing or rales.  Musculoskeletal:     Cervical back: Neck supple.     Right lower leg: No edema.     Left lower leg: No edema.  Lymphadenopathy:     Cervical: No cervical adenopathy.  Neurological:     Mental Status: He is alert.  Psychiatric:        Mood and Affect: Mood normal.        Behavior: Behavior normal.           Assessment & Plan:

## 2021-11-14 NOTE — Assessment & Plan Note (Signed)
Creatinine up last time Will recheck  If worse---send to nephrology If still low, will plan to start lisinopril 5mg    BP Readings from Last 3 Encounters:  11/14/21 116/70  05/09/21 106/74  11/01/20 104/76

## 2021-11-14 NOTE — Assessment & Plan Note (Signed)
Didn't do well with tamsulosin---caused urgency and some incontinence (which persist off the med) If ongoing problems, would try finasteride

## 2021-12-18 ENCOUNTER — Other Ambulatory Visit: Payer: Self-pay

## 2021-12-18 MED ORDER — TRUE METRIX BLOOD GLUCOSE TEST VI STRP: ORAL_STRIP | 4 refills | Status: DC

## 2022-03-10 ENCOUNTER — Other Ambulatory Visit: Payer: Self-pay | Admitting: Internal Medicine

## 2022-03-10 DIAGNOSIS — E7800 Pure hypercholesterolemia, unspecified: Secondary | ICD-10-CM

## 2022-05-23 ENCOUNTER — Ambulatory Visit (INDEPENDENT_AMBULATORY_CARE_PROVIDER_SITE_OTHER): Payer: Medicare HMO | Admitting: Internal Medicine

## 2022-05-23 ENCOUNTER — Encounter: Payer: Self-pay | Admitting: Internal Medicine

## 2022-05-23 VITALS — BP 118/64 | HR 64 | Temp 97.6°F | Ht 71.5 in | Wt 188.1 lb

## 2022-05-23 DIAGNOSIS — E1140 Type 2 diabetes mellitus with diabetic neuropathy, unspecified: Secondary | ICD-10-CM

## 2022-05-23 DIAGNOSIS — N3281 Overactive bladder: Secondary | ICD-10-CM

## 2022-05-23 DIAGNOSIS — I700 Atherosclerosis of aorta: Secondary | ICD-10-CM

## 2022-05-23 DIAGNOSIS — E0840 Diabetes mellitus due to underlying condition with diabetic neuropathy, unspecified: Secondary | ICD-10-CM

## 2022-05-23 DIAGNOSIS — Z0000 Encounter for general adult medical examination without abnormal findings: Secondary | ICD-10-CM | POA: Diagnosis not present

## 2022-05-23 LAB — RENAL FUNCTION PANEL
Albumin: 4 g/dL (ref 3.5–5.2)
BUN: 18 mg/dL (ref 6–23)
CO2: 31 mEq/L (ref 19–32)
Calcium: 9.2 mg/dL (ref 8.4–10.5)
Chloride: 106 mEq/L (ref 96–112)
Creatinine, Ser: 1.4 mg/dL (ref 0.40–1.50)
GFR: 46.57 mL/min — ABNORMAL LOW (ref >60.00)
Glucose, Bld: 123 mg/dL — ABNORMAL HIGH (ref 70–99)
Phosphorus: 3.5 mg/dL (ref 2.3–4.6)
Potassium: 4.4 mEq/L (ref 3.5–5.1)
Sodium: 141 mEq/L (ref 135–145)

## 2022-05-23 LAB — HEPATIC FUNCTION PANEL
ALT: 8 U/L (ref 0–53)
AST: 10 U/L (ref 0–37)
Albumin: 4 g/dL (ref 3.5–5.2)
Alkaline Phosphatase: 47 U/L (ref 39–117)
Bilirubin, Direct: 0.1 mg/dL (ref 0.0–0.3)
Total Bilirubin: 0.3 mg/dL (ref 0.2–1.2)
Total Protein: 7.4 g/dL (ref 6.0–8.3)

## 2022-05-23 LAB — MICROALBUMIN / CREATININE URINE RATIO
Creatinine,U: 64.2 mg/dL
Microalb Creat Ratio: 10.8 mg/g (ref 0.0–30.0)
Microalb, Ur: 6.9 mg/dL — ABNORMAL HIGH (ref 0.0–1.9)

## 2022-05-23 LAB — CBC
HCT: 35 % — ABNORMAL LOW (ref 39.0–52.0)
Hemoglobin: 11.7 g/dL — ABNORMAL LOW (ref 13.0–17.0)
MCHC: 33.3 g/dL (ref 30.0–36.0)
MCV: 86.1 fl (ref 78.0–100.0)
Platelets: 180 10*3/uL (ref 150.0–400.0)
RBC: 4.07 Mil/uL — ABNORMAL LOW (ref 4.22–5.81)
RDW: 14.8 % (ref 11.5–15.5)
WBC: 7.1 10*3/uL (ref 4.0–10.5)

## 2022-05-23 LAB — LIPID PANEL
Cholesterol: 126 mg/dL (ref 0–200)
HDL: 27.7 mg/dL — ABNORMAL LOW (ref >39.00)
LDL Cholesterol: 72 mg/dL (ref 0–99)
NonHDL: 98.75
Total CHOL/HDL Ratio: 5
Triglycerides: 133 mg/dL (ref 0.0–149.0)
VLDL: 26.6 mg/dL (ref 0.0–40.0)

## 2022-05-23 LAB — HM DIABETES FOOT EXAM

## 2022-05-23 LAB — HEMOGLOBIN A1C: Hgb A1c MFr Bld: 7 % — ABNORMAL HIGH (ref 4.6–6.5)

## 2022-05-23 NOTE — Progress Notes (Signed)
Subjective:    Patient ID: Frank Carlson, male    DOB: [birth year], 81 y.o.   MRN: [id number]  HPI Here for Medicare wellness visit and follow up of chronic health conditions Reviewed form and advanced directives Reviewed other doctors No alcohol or tobacco Vision is fine Hearing is helped by the hearing aides No falls No depression or anhedonia Is exercising 6 days a week[facility] with instrumental ADLs Mild memory issues--mostly recall (like names)  Having ongoing urinary problems Mostly urgency and incontinence Has to change Depends three times a day--needs at night most  Checks sugars every morning Generally around 120 Mild sensory change in feet---no burning or pain Rare feeling of imbalance  No chest pain  No SOB No dizziness or syncope No edema No palpitations Known aortic atherosclerosis  Current Outpatient Medications on File Prior to Visit  Medication Sig Dispense Refill   Calcium Carbonate-Vitamin D (CALCIUM-D PO) Take 1 tablet by mouth at bedtime.     glucose blood (TRUE METRIX BLOOD GLUCOSE TEST) test strip TEST BLOOD SUGAR EVERY DAY. Dx Code E11.49 100 strip 4   metFORMIN (GLUCOPHAGE) 500 MG tablet TAKE 1 TABLET TWO TIMES DAILY WITH A MEAL. 180 tablet 3   Multiple Vitamin (MULTIVITAMIN WITH MINERALS) TABS tablet Take 1 tablet by mouth every morning.      Omega-3 Fatty Acids (FISH OIL PO) Take 1 capsule by mouth every morning.      polyvinyl alcohol (LIQUIFILM TEARS) 1.4 % ophthalmic solution Place 1 drop into both eyes 2 (two) times daily.     simvastatin (ZOCOR) 20 MG tablet TAKE 1 TABLET AT BEDTIME 90 tablet 3   TRUEplus Lancets 30G MISC TEST BLOOD SUGAR EVERY DAY 100 each 3   No current facility-administered medications on file prior to visit.    Allergies  Allergen Reactions   Bee Venom Other (See Comments)    Passed out    Past Medical History:  Diagnosis Date   BPH (benign prostatic hypertrophy)    COLONIC POLYPS, HX OF  03/15/2008   COLOR BLINDNESS 11/01/2009   CONCUSSION WITH LOC OF 30 MINUTES OR LESS 11/01/2009   Diabetes mellitus without complication ([place])    TYPE 2   ED (erectile dysfunction)    HEARING LOSS, BILATERAL 11/01/2009   Wears hearing aids   Hyperlipidemia    JOINT STIFFNESS, HAND 10/28/2008   Pleural effusion 10/2019   Pneumonia    Type II or unspecified type diabetes mellitus with neurological manifestations, not stated as uncontrolled(250.60) 1/15   Type 2    Past Surgical History:  Procedure Laterality Date   CATARACT EXTRACTION Bilateral 07/2010   OD with IOL   COLONOSCOPY W/ POLYPECTOMY     INCISION / DRAINAGE HAND / FINGER Right    INGUINAL HERNIA REPAIR Right 08/23/2015   Procedure: LAPAROSCOPIC RIGHT INGUINAL HERNIA REPAIR WITH MESH;  Surgeon: Ralene Ok, MD;  Location: [place];  Service: General;  Laterality: Right;   INSERTION OF MESH Right 08/23/2015   Procedure: INSERTION OF MESH;  Surgeon: Ralene Ok, MD;  Location: [place];  Service: General;  Laterality: Right;   IR THORACENTESIS ASP PLEURAL SPACE W/IMG GUIDE  10/15/2019   TONSILLECTOMY     VASECTOMY      Family History  Problem Relation Age of Onset   Alzheimer's disease Mother    Dementia Mother    Coronary artery disease Father    Heart disease Father    Coronary artery disease Other  Diabetes Other    Alzheimer's disease Sister    Dementia Brother    Alzheimer's disease Brother     Social History   Socioeconomic History   Marital status: Married    Spouse name: Lovey Newcomer   Number of children: 3   Years of education: 16   Highest education level: Not on file  Occupational History   Occupation: Therapist, occupational    Comment: Retired  Tobacco Use   Smoking status: Former    Packs/day: 1.50    Years: 5.00    Total pack years: 7.50    Types: Cigarettes    Start date: 1955    Quit date: 11/25/1958    Years since quitting: 63.5   Smokeless tobacco: Never  Vaping Use   Vaping Use:  Never used  Substance and Sexual Activity   Alcohol use: No   Drug use: No   Sexual activity: Yes    Partners: Female  Other Topics Concern   Not on file  Social History Narrative   [employer]- Estate manager/land agent.    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    Married '64. 3 sons- '66, '68, '71; 5 grandchildren, 1 step g-dtr.       Has living will   Wife is health care POA--then son Mitzi Hansen   Would accept resuscitation attempts but no prolonged ventilatory support   Probably wouldn't want prolonged tube feeds   Social Determinants of Health   Financial Resource Strain: Low Risk  (09/22/2020)   Overall Financial Resource Strain (CARDIA)    Difficulty of Paying Living Expenses: Not hard at all  Food Insecurity: Not on file  Transportation Needs: Not on file  Physical Activity: Not on file  Stress: Not on file  Social Connections: Not on file  Intimate Partner Violence: Not on file   Review of Systems Appetite is good Weight is stable Sleeps well Wears seat belt Teeth okay--establishing with new dentist No sig back or joint pains No suspicious skin lesions----some easy brusing    Objective:   Physical Exam Constitutional:      Appearance: Normal appearance.  HENT:     Mouth/Throat:     Comments: No lesions Eyes:     Conjunctiva/sclera: Conjunctivae normal.     Pupils: Pupils are equal, round, and reactive to light.  Cardiovascular:     Rate and Rhythm: Normal rate and regular rhythm.     Pulses: Normal pulses.     Heart sounds: No murmur heard.    No gallop.  Pulmonary:     Effort: Pulmonary effort is normal.     Breath sounds: Normal breath sounds. No wheezing or rales.  Abdominal:     Palpations: Abdomen is soft.     Tenderness: There is no abdominal tenderness.  Musculoskeletal:     Cervical back: Neck supple.     Right lower leg: No edema.     Left lower leg: No edema.  Lymphadenopathy:     Cervical: No cervical adenopathy.  Skin:    Findings:  No lesion or rash.     Comments: No foot lesions  Neurological:     General: No focal deficit present.     Mental Status: He is alert and oriented to person, place, and time.     Comments: Mini-cog normal Very slight sensory changes in feet  Psychiatric:        Mood and Affect: Mood normal.        Behavior: Behavior normal.  Assessment & Plan:

## 2022-05-23 NOTE — Assessment & Plan Note (Signed)
Urinary symptoms seem to be bladder and not BPH Discussed Rx but didn't press---he will consider If symptoms worsen, will start myrbetriq '25mg'$  daily and monitor BP

## 2022-05-23 NOTE — Assessment & Plan Note (Signed)
Lab Results  Component Value Date   HGBA1C 6.8 (A) 11/14/2021   Still seems to have good control Metformin 500 mg bid Very mild neuropathy--no action needed (other than being careful with balance)

## 2022-05-23 NOTE — Assessment & Plan Note (Signed)
I have personally reviewed the Medicare Annual Wellness questionnaire and have noted 1. The patient's medical and social history 2. Their use of alcohol, tobacco or illicit drugs 3. Their current medications and supplements 4. The patient's functional ability including ADL's, fall risks, home safety risks and hearing or visual             impairment. 5. Diet and physical activities 6. Evidence for depression or mood disorders  The patients weight, height, BMI and visual acuity have been recorded in the chart I have made referrals, counseling and provided education to the patient based review of the above and I have provided the pt with a written personalized care plan for preventive services.  I have provided you with a copy of your personalized plan for preventive services. Please take the time to review along with your updated medication list.  Stays fit Done with cancer screening due to age Consider updated COVID vaccine Flu vaccine in the fall Consider shingrix at pharmacy

## 2022-05-23 NOTE — Assessment & Plan Note (Signed)
On CT scan Is on simvastatin '20mg'$  daily

## 2022-06-12 ENCOUNTER — Emergency Department (HOSPITAL_COMMUNITY): Payer: Medicare HMO

## 2022-06-12 ENCOUNTER — Ambulatory Visit: Payer: Medicare HMO | Admitting: Internal Medicine

## 2022-06-12 ENCOUNTER — Telehealth: Payer: Self-pay | Admitting: Internal Medicine

## 2022-06-12 ENCOUNTER — Emergency Department (HOSPITAL_COMMUNITY)
Admission: EM | Admit: 2022-06-12 | Discharge: 2022-06-12 | Disposition: A | Discharge status: Home / Self Care | Payer: Medicare HMO | Attending: Emergency Medicine | Admitting: Emergency Medicine

## 2022-06-12 ENCOUNTER — Other Ambulatory Visit: Payer: Self-pay

## 2022-06-12 ENCOUNTER — Encounter (HOSPITAL_COMMUNITY): Payer: Self-pay

## 2022-06-12 DIAGNOSIS — N1330 Unspecified hydronephrosis: Secondary | ICD-10-CM | POA: Diagnosis not present

## 2022-06-12 DIAGNOSIS — N3001 Acute cystitis with hematuria: Secondary | ICD-10-CM | POA: Insufficient documentation

## 2022-06-12 DIAGNOSIS — Z7984 Long term (current) use of oral hypoglycemic drugs: Secondary | ICD-10-CM | POA: Insufficient documentation

## 2022-06-12 DIAGNOSIS — N390 Urinary tract infection, site not specified: Secondary | ICD-10-CM | POA: Diagnosis not present

## 2022-06-12 DIAGNOSIS — N281 Cyst of kidney, acquired: Secondary | ICD-10-CM | POA: Diagnosis not present

## 2022-06-12 DIAGNOSIS — R319 Hematuria, unspecified: Secondary | ICD-10-CM | POA: Diagnosis not present

## 2022-06-12 DIAGNOSIS — E119 Type 2 diabetes mellitus without complications: Secondary | ICD-10-CM | POA: Diagnosis not present

## 2022-06-12 LAB — COMPREHENSIVE METABOLIC PANEL
ALT: 13 U/L (ref 0–44)
AST: 14 U/L — ABNORMAL LOW (ref 15–41)
Albumin: 3.6 g/dL (ref 3.5–5.0)
Alkaline Phosphatase: 43 U/L (ref 38–126)
Anion gap: 6 (ref 5–15)
BUN: 22 mg/dL (ref 8–23)
CO2: 26 mmol/L (ref 22–32)
Calcium: 9.3 mg/dL (ref 8.9–10.3)
Chloride: 109 mmol/L (ref 98–111)
Creatinine, Ser: 1.51 mg/dL — ABNORMAL HIGH (ref 0.61–1.24)
GFR, Estimated: 46 mL/min — ABNORMAL LOW (ref >60)
Glucose, Bld: 98 mg/dL (ref 70–99)
Potassium: 4.4 mmol/L (ref 3.5–5.1)
Sodium: 141 mmol/L (ref 135–145)
Total Bilirubin: 0.2 mg/dL — ABNORMAL LOW (ref 0.3–1.2)
Total Protein: 7.6 g/dL (ref 6.5–8.1)

## 2022-06-12 LAB — PROTIME-INR
INR: 1.1 (ref 0.8–1.2)
Prothrombin Time: 13.9 seconds (ref 11.4–15.2)

## 2022-06-12 LAB — CBC WITH DIFFERENTIAL/PLATELET
Abs Immature Granulocytes: 0.01 10*3/uL (ref 0.00–0.07)
Basophils Absolute: 0 10*3/uL (ref 0.0–0.1)
Basophils Relative: 0 %
Eosinophils Absolute: 0 10*3/uL (ref 0.0–0.5)
Eosinophils Relative: 0 %
HCT: 35.9 % — ABNORMAL LOW (ref 39.0–52.0)
Hemoglobin: 11.7 g/dL — ABNORMAL LOW (ref 13.0–17.0)
Immature Granulocytes: 0 %
Lymphocytes Relative: 33 %
Lymphs Abs: 2.2 10*3/uL (ref 0.7–4.0)
MCH: 28.6 pg (ref 26.0–34.0)
MCHC: 32.6 g/dL (ref 30.0–36.0)
MCV: 87.8 fL (ref 80.0–100.0)
Monocytes Absolute: 0.7 10*3/uL (ref 0.1–1.0)
Monocytes Relative: 11 %
Neutro Abs: 3.8 10*3/uL (ref 1.7–7.7)
Neutrophils Relative %: 56 %
Platelets: 202 10*3/uL (ref 150–400)
RBC: 4.09 MIL/uL — ABNORMAL LOW (ref 4.22–5.81)
RDW: 14.2 % (ref 11.5–15.5)
WBC: 6.8 10*3/uL (ref 4.0–10.5)
nRBC: 0 % (ref 0.0–0.2)

## 2022-06-12 LAB — URINALYSIS, ROUTINE W REFLEX MICROSCOPIC
Bilirubin Urine: NEGATIVE
Glucose, UA: NEGATIVE mg/dL
Ketones, ur: NEGATIVE mg/dL
Nitrite: NEGATIVE
Protein, ur: 100 mg/dL — AB
RBC / HPF: >50 RBC/hpf — ABNORMAL HIGH (ref 0–5)
Specific Gravity, Urine: 1.009 (ref 1.005–1.030)
WBC, UA: >50 WBC/hpf — ABNORMAL HIGH (ref 0–5)
pH: 6 (ref 5.0–8.0)

## 2022-06-12 MED ORDER — SULFAMETHOXAZOLE-TRIMETHOPRIM 800-160 MG PO TABS: 1.0000 | ORAL_TABLET | Freq: Two times a day (BID) | ORAL | 0 refills | Status: AC

## 2022-06-12 MED ORDER — TAMSULOSIN HCL 0.4 MG PO CAPS: 0.8000 mg | ORAL_CAPSULE | Freq: Every day | ORAL | 0 refills | Status: AC

## 2022-06-12 MED ORDER — SODIUM CHLORIDE 0.9 % IV SOLN
1.0000 g | Freq: Once | INTRAVENOUS | Status: AC
  Administered 2022-06-12: 1 g via INTRAVENOUS
  Filled 2022-06-12: qty 10

## 2022-06-12 NOTE — Consult Note (Signed)
Urology Consult Note   Requesting Attending Physician:  Elgie Congo, MD Service Providing Consult: Urology  Consulting Attending: Dr. Louis Meckel   Reason for Consult:  Hematuria, retention  HPI: Frank Carlson is seen in consultation for reasons noted above at the request of Elgie Congo, MD for evaluation of hematuria.  This is a 81 y.o. male with BPH, T2DM, erectile dysfunction, and HLD who presents with hematuria x3 days. Bladder scan in ED with ~400cc in bladder, unable to void. Hgb stable, creatinin 1.5 from prior 1.4 about 2 weeks prior to presentation. Renal ultrasound was obtained which showed severe bilateral hydro with bladder wall thickening, enlarged prostate, and internal debris but does not appear to be any formed clot. UA with signs of infection. Catheter placed at bedside with return of about 650cc of pink tinged urine. Flushed with sterile water without any clot return. He has previously taken flomax but has never seen a urologist.   Past Medical History: 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   CONCUSSION WITH LOC OF 30 MINUTES OR LESS 11/01/2009   Diabetes mellitus without complication ([place])    TYPE 2   ED (erectile dysfunction)    HEARING LOSS, BILATERAL 11/01/2009   Wears hearing aids   Hyperlipidemia    JOINT STIFFNESS, HAND 10/28/2008   Pleural effusion 10/2019   Pneumonia    Type II or unspecified type diabetes mellitus with neurological manifestations, not stated as uncontrolled(250.60) 1/15   Type 2    Past Surgical History:  Past Surgical History:  Procedure Laterality Date   CATARACT EXTRACTION Bilateral 07/2010   OD with IOL   COLONOSCOPY W/ POLYPECTOMY     INCISION / DRAINAGE HAND / FINGER Right    INGUINAL HERNIA REPAIR Right 08/23/2015   Procedure: LAPAROSCOPIC RIGHT INGUINAL HERNIA REPAIR WITH MESH;  Surgeon: Ralene Ok,  MD;  Location: [place];  Service: General;  Laterality: Right;   INSERTION OF MESH Right 08/23/2015   Procedure: INSERTION OF MESH;  Surgeon: Ralene Ok, MD;  Location: [place];  Service: General;  Laterality: Right;   IR THORACENTESIS ASP PLEURAL SPACE W/IMG GUIDE  10/15/2019   TONSILLECTOMY     VASECTOMY      Medication: No current facility-administered medications for this encounter.   Current Outpatient Medications  Medication Sig Dispense Refill   metFORMIN (GLUCOPHAGE) 500 MG tablet TAKE 1 TABLET TWO TIMES DAILY WITH A MEAL. 180 tablet 3   Multiple Vitamin (MULTIVITAMIN WITH MINERALS) TABS tablet Take 1 tablet by mouth every morning.      Omega-3 Fatty Acids (FISH OIL PO) Take 1 capsule by mouth every morning.      simvastatin (ZOCOR) 20 MG tablet TAKE 1 TABLET AT BEDTIME (Patient taking differently: Take 20 mg by mouth at bedtime.) 90 tablet 3   glucose blood (TRUE METRIX BLOOD GLUCOSE TEST) test strip TEST BLOOD SUGAR EVERY DAY. Dx Code E11.49 100 strip 4   TRUEplus Lancets 30G MISC TEST BLOOD SUGAR EVERY DAY 100 each 3    Allergies: Allergies  Allergen Reactions   Bee Venom Other (See Comments)    Passed out    Social History: Social History   Tobacco Use   Smoking status: Former    Packs/day: 1.50    Years: 5.00    Total pack years: 7.50    Types: Cigarettes    Start date: 1955  Quit date: 11/25/1958    Years since quitting: 63.5   Smokeless tobacco: Never  Vaping Use   Vaping Use: Never used  Substance Use Topics   Alcohol use: No   Drug use: No    Family History Family History  Problem Relation Age of Onset   Alzheimer's disease Mother    Dementia Mother    Coronary artery disease Father    Heart disease Father    Coronary artery disease Other    Diabetes Other    Alzheimer's disease Sister    Dementia Brother    Alzheimer's disease Brother     Review of Systems 10 systems were reviewed and are negative except as noted specifically in the  HPI.  Objective   Vital signs in last 24 hours: BP (!) 134/57   Pulse (!) 50   Temp 98.2 F (36.8 C) (Oral)   Resp 16   Ht '6\' 1"'$  (1.854 m)   Wt 79.4 kg   SpO2 100%   BMI 23.09 kg/m   Physical Exam General: NAD, A&O, resting, appropriate HEENT: [place]/AT, EOMI, MMM Pulmonary: Normal work of breathing Cardiovascular: HDS, adequate peripheral perfusion Abdomen: Soft, NTTP, nondistended. GU: foley in place, clear pink urine, no CVA tenderness Extremities: warm and well perfused Neuro: Appropriate, no focal neurological deficits  Most Recent Labs: Lab Results  Component Value Date   WBC 6.8 06/12/2022   HGB 11.7 (L) 06/12/2022   HCT 35.9 (L) 06/12/2022   PLT 202 06/12/2022    Lab Results  Component Value Date   NA 141 06/12/2022   K 4.4 06/12/2022   CL 109 06/12/2022   CO2 26 06/12/2022   BUN 22 06/12/2022   CREATININE 1.51 (H) 06/12/2022   CALCIUM 9.3 06/12/2022   MG 2.0 10/16/2019   PHOS 3.5 05/23/2022    Lab Results  Component Value Date   INR 1.1 06/12/2022   APTT 27 08/06/2020     Urine Culture: '@[id number]'$ (laburin,org,[id number])@   IMAGING: US Renal  Result Date: 06/12/2022 CLINICAL DATA:  Gross hematuria since March. EXAM: RENAL / URINARY TRACT ULTRASOUND COMPLETE COMPARISON:  CT abdomen and pelvis 11/16/2015 FINDINGS: Right Kidney: Renal measurements: 13 x 5.4 x 6.3 cm = volume: 231 mL. Severe hydronephrosis is present. Parenchymal echotexture is normal. Benign-appearing simple cyst on the mid pole of the right kidney measuring 4.5 cm maximal diameter. Similar to previous study. No additional imaging follow-up is indicated. Left Kidney: Renal measurements: 12.4 x 7 x 5.5 cm = volume: 249 mL. Severe hydronephrosis is demonstrated. Parenchymal echotexture and thickness is normal. Large benign-appearing simple cyst on the lower pole of the left kidney measuring 10.7 cm maximal diameter. Similar to previous study. No additional follow-up is indicated. Bladder:  Diffuse thickening of the bladder wall at 6 mm. Internal echoes consistent with debris in the bladder. This could correspond to hemorrhage or infection. A ureteral flow jet is demonstrated on the left using color flow Doppler. The right ureteral jet is not seen. Other: Prostate gland is enlarged, measuring 6.1 x 4.2 x 7 cm. IMPRESSION: 1. Severe bilateral hydronephrosis.  Cause is not identified. 2. Bilateral benign-appearing renal cysts. No imaging follow-up is identified. Similar size to previous CT from 2017. 3. Diffuse bladder wall thickening. Diffuse internal echoes consistent with debris. 4. Enlarged prostate gland. Electronically Signed   By: Lucienne Capers M.D.   On: 06/12/2022 18:56    ------  Assessment:  81 y.o. male with urinary retention, UTI, and bilateral hydro. Expect he is  having overflow incontinence with UTI as likely cause of hematuria. We will plan for bladder rest with the catheter followed by outpatient urology follow-up for trial of void and discussion of management options for BPH and consideration of hematuria workup   Recommendations: - ok to discharge with foley catheter - Please send with 10d bactrim - double flomax dose (discussed with patient) - urology to coordinate appropriate outpatient follow-up   Thank you for this consult. Please contact the urology consult pager with any further questions/concerns.

## 2022-06-12 NOTE — Telephone Encounter (Signed)
Unable to reach pt by phone; pt is already scheduled for appt with Dr Silvio Pate 06/12/22 at 11:15 AM but disposition of access nurse was for pt to go to ED. See note from [place]. Sending note to Dr Silvio Pate and Larene Beach CMA.

## 2022-06-12 NOTE — Telephone Encounter (Addendum)
I tried calling pt again and per chart review tab pt is already at [facility] ED since 10;38. Sending note to Dr Silvio Pate and Larene Beach CMA.

## 2022-06-12 NOTE — Telephone Encounter (Signed)
I see that. I will await his ER evaluation and then make plans based on what they find

## 2022-06-12 NOTE — Telephone Encounter (Signed)
[facility] - Client TELEPHONE ADVICE RECORD [facility] Patient Name: Frank Carlson Gender: Male DOB: August 17, 1941 Age: 81 Y 3 M 14 D Return Phone Number: [phone] (Primary) Address: City/ State/ Zip: [place]  [address] Client [facility] - Client Client Site [facility] Provider Viviana Simpler- MD Contact Type Call Who Is Calling Patient / Member / Family / Caregiver Call Type Triage / Clinical Relationship To Patient Self Return Phone Number [phone] (Primary) Chief Complaint Urine, Blood In Reason for Call Symptomatic / Request for Health Information Initial Comment Call sent from office. Caller requesting appt for blood clots in urine. Translation No Nurse Assessment Nurse: Ysidro Evert, RN, Levada Dy Date/Time (Eastern Time): 06/12/2022 8:39:44 AM Confirm and document reason for call. If symptomatic, describe symptoms. ---Caller states he has blood in his urine and he is seeing some blood clots. Symptoms started about 3 days ago. No fever Does the patient have any new or worsening symptoms? ---Yes Will a triage be completed? ---Yes Related visit to physician within the last 2 weeks? ---No Does the PT have any chronic conditions? (i.e. diabetes, asthma, this includes High risk factors for pregnancy, etc.) ---Yes List chronic conditions. ---diabetes Is this a behavioral health or substance abuse call? ---No Guidelines Guideline Title Affirmed Question Affirmed Notes Nurse Date/Time (Eastern Time) Urine - Blood In Passing pure blood or large blood clots (i.e., size > a dime) (Exception: Fleck or small strands.) Ysidro Evert, RN, Levada Dy 06/12/2022 8:41:28 AM Disp. Time Eilene Ghazi Time) Disposition Final User 06/12/2022 8:44:38 AM Go to ED Now Yes Ysidro Evert, RN, Levada Dy PLEASE NOTE: All timestamps contained within this report are represented as [place] Standard Time. CONFIDENTIALTY NOTICE:  This fax transmission is intended only for the addressee. It contains information that is legally privileged, confidential or otherwise protected from use or disclosure. If you are not the intended recipient, you are strictly prohibited from reviewing, disclosing, copying using or disseminating any of this information or taking any action in reliance on or regarding this information. If you have received this fax in error, please notify us immediately by telephone so that we can arrange for its return to [place]. Phone: [phone], Toll-Free: [phone], Fax: [phone] Page: 2 of 2 Call Id: [id number] Final Disposition 06/12/2022 8:44:38 AM Go to ED Now Yes Ysidro Evert, RN, Marin Shutter Disagree/Comply Comply Caller Understands Yes PreDisposition Did not know what to do Care Advice Given Per Guideline GO TO ED NOW: * You need to be seen in the Emergency Department. * Go to the ED at ___________ [place] now. Drive carefully. ANOTHER ADULT SHOULD DRIVE: * It is better and safer if another adult drives instead of you. CARE ADVICE given per Urine, Blood In (Adult) guideline. Referrals [place]

## 2022-06-12 NOTE — ED Notes (Signed)
Frank Carlson wife [phone] requesting an update

## 2022-06-12 NOTE — Telephone Encounter (Signed)
We can address it at the [facility] today.

## 2022-06-12 NOTE — Discharge Instructions (Addendum)
You were seen in the emergency department today for hematuria and urinary retention.  You are seen evaluate by the urology team who placed a urinary catheter.  They will arrange for your outpatient follow-up in the clinic.  I have included their phone number if they did fail to call you in the next week.  Take the antibiotics Bactrim as prescribed for the next 7 days.  Increase your Flomax to 0.8 mg daily.  Come back for any severe pain, fevers, blockage of urine draining from your catheter, or any other symptoms concerning to you.

## 2022-06-12 NOTE — ED Provider Triage Note (Addendum)
Emergency Medicine Provider Triage Evaluation Note  Frank Carlson , a 81 y.o. male  was evaluated in triage.  Pt complains of painless hematuria.  Patient states that symptoms began approximately 4 days ago.  He notes 1 year history of difficulty urinating of which she was placed on Flomax.  He tried Flomax for several months before feeling like it made him urinate too much.  He stopped taking it and has had recurrence of difficulty voiding type symptoms that are not new in nature as of recent.  He denies any abdominal pain, fever, flank/back pain, dysuria. Denies blood thinner use  Review of Systems  Positive: See above Negative:   Physical Exam  BP (!) 148/78 (BP Location: Right Arm)   Pulse (!) 59   Temp 98.2 F (36.8 C) (Oral)   Resp 16   Ht '6\' 1"'$  (1.854 m)   Wt 79.4 kg   SpO2 99%   BMI 23.09 kg/m  Gen:   Awake, no distress   Resp:  Normal effort  MSK:   Moves extremities without difficulty  Other:  No tenderness to palpation of abdomen.  No CVA tenderness bilaterally.  Medical Decision Making  Medically screening exam initiated at 11:22 AM.  Appropriate orders placed.  Frank Carlson was informed that the remainder of the evaluation will be completed by another provider, this initial triage assessment does not replace that evaluation, and the importance of remaining in the ED until their evaluation is complete.     Wilnette Kales, [place] 06/12/22 [place], [place], [place] 06/12/22 1131

## 2022-06-12 NOTE — ED Notes (Signed)
Urologist at bedside.

## 2022-06-12 NOTE — ED Notes (Signed)
Family updated as to patient's status.

## 2022-06-12 NOTE — ED Notes (Signed)
US at bedside

## 2022-06-12 NOTE — ED Triage Notes (Signed)
Pt arrived POV from home c/o hematuria x3 days, Pt denies any difficulty using the bathroom or pain.

## 2022-06-12 NOTE — Telephone Encounter (Signed)
Patient called in to schedule an appt because he had a little blood in his urine, I scheduled him with Dr Silvio Pate for today. He said after I scheduled that some of it was a little clumpy like possible clotting so I transferred him to the triage nurse for further eval

## 2022-06-12 NOTE — ED Provider Notes (Signed)
Clinical Course as of 06/12/22 2110  Tue Jun 12, 2022  1742 Received signout from previous provider.  Briefly patient with hypertension hyperlipidemia BPH on Flomax who presents with gross hematuria.  Found to have urinary retention 368 on PVR and gross blood/hematuria with greater than 50 WBCs normal white blood cell count 6.8.  Hemoglobin 11.7 and stable not requiring transfusion.  Paged urology for consult for possibly starting CBI and order for IV Rocephin 1 g. [VB]  1245 Spoke to [place] Rao of urology who recommends renal ultrasound first and then recall urology. [VB]  [id number] Urology requests 48 French coud Foley catheter.  They will be down to evaluate the patient and provide further recommendations. [VB]  2108 Patient has been seen and evaluated by urology team.  They have placed Foley catheter and will arrange for his outpatient follow-up.  They are increasing his Flomax to 0.8 mg daily and I will discharge patient on Bactrim for 7 days.  Safe for discharge home. [VB]    Clinical Course User Index [VB] Elgie Congo, MD      Elgie Congo, MD 06/12/22 2111

## 2022-06-13 ENCOUNTER — Telehealth: Payer: Self-pay

## 2022-06-13 NOTE — Telephone Encounter (Signed)
Spoke to pt to see how he was doing after ER visit yesterday for UTI. He said he is doing better. He did not know he needed to call urology to schedule an appt. I gave him the number to [facility] so he can get that appt scheduled.

## 2022-07-01 ENCOUNTER — Other Ambulatory Visit: Payer: Self-pay | Admitting: Internal Medicine

## 2022-07-01 DIAGNOSIS — E7800 Pure hypercholesterolemia, unspecified: Secondary | ICD-10-CM

## 2022-07-02 NOTE — ED Provider Notes (Signed)
[facility] EMERGENCY DEPARTMENT Provider Note   CSN: [id number] Arrival date & time: 06/12/22  1038     History  Chief Complaint  Patient presents with   Hematuria    Frank Carlson is a 81 y.o. male.  Patient is an 81 year old male with a past medical history of diabetes and BPH presenting to the emergency department with hematuria for the last 3 days.  He states that he has noticed small amounts of blood clots in his urine.  He denies any dysuria, abdominal pain or flank pain, fevers or chills, nausea or vomiting.  He denies any blood thinner use.  He states he feels as though he can completely empty his bladder.   Hematuria       Home Medications Prior to Admission medications   Medication Sig Start Date End Date Taking? Authorizing Provider  metFORMIN (GLUCOPHAGE) 500 MG tablet TAKE 1 TABLET TWO TIMES DAILY WITH A MEAL. 10/30/21  Yes Venia Carbon, MD  Multiple Vitamin (MULTIVITAMIN WITH MINERALS) TABS tablet Take 1 tablet by mouth every morning.    Yes [provider]  Omega-3 Fatty Acids (FISH OIL PO) Take 1 capsule by mouth every morning.    Yes [provider]  simvastatin (ZOCOR) 20 MG tablet TAKE 1 TABLET AT BEDTIME Patient taking differently: Take 20 mg by mouth at bedtime. 03/12/22  Yes Venia Carbon, MD  tamsulosin (FLOMAX) 0.4 MG CAPS capsule Take 2 capsules (0.8 mg total) by mouth daily after supper. 06/12/22 07/12/22 Yes Elgie Congo, MD  glucose blood (TRUE METRIX BLOOD GLUCOSE TEST) test strip TEST BLOOD SUGAR EVERY DAY. Dx Code E11.49 12/18/21   Venia Carbon, MD  TRUEplus Lancets 30G MISC TEST BLOOD SUGAR EVERY DAY 08/09/21   Venia Carbon, MD      Allergies    Bee venom    Review of Systems   Review of Systems  Genitourinary:  Positive for hematuria.    Physical Exam Updated Vital Signs BP 128/66   Pulse (!) 57   Temp 98.4 F (36.9 C) (Oral)   Resp 17   Ht '6\' 1"'$  (1.854 m)   Wt 79.4 kg    SpO2 98%   BMI 23.09 kg/m  Physical Exam Vitals and nursing note reviewed.  Constitutional:      General: He is not in acute distress.    Appearance: Normal appearance.  HENT:     Head: Normocephalic and atraumatic.     Nose: Nose normal.     Mouth/Throat:     Mouth: Mucous membranes are moist.     Pharynx: Oropharynx is clear.  Eyes:     Extraocular Movements: Extraocular movements intact.     Conjunctiva/sclera: Conjunctivae normal.  Cardiovascular:     Rate and Rhythm: Normal rate and regular rhythm.  Pulmonary:     Effort: Pulmonary effort is normal.     Breath sounds: Normal breath sounds.  Abdominal:     General: Abdomen is flat.     Palpations: Abdomen is soft.     Tenderness: There is no abdominal tenderness. There is no right CVA tenderness or left CVA tenderness.  Musculoskeletal:        General: Normal range of motion.     Cervical back: Normal range of motion and neck supple.  Skin:    General: Skin is warm and dry.  Neurological:     General: No focal deficit present.     Mental Status: He is  alert and oriented to person, place, and time.  Psychiatric:        Mood and Affect: Mood normal.        Behavior: Behavior normal.     ED Results / Procedures / Treatments   Labs (all labs ordered are listed, but only abnormal results are displayed) Labs Reviewed  COMPREHENSIVE METABOLIC PANEL - Abnormal; Notable for the following components:      Result Value   Creatinine, Ser 1.51 (*)    AST 14 (*)    Total Bilirubin 0.2 (*)    GFR, Estimated 46 (*)    All other components within normal limits  CBC WITH DIFFERENTIAL/PLATELET - Abnormal; Notable for the following components:   RBC 4.09 (*)    Hemoglobin 11.7 (*)    HCT 35.9 (*)    All other components within normal limits  URINALYSIS, ROUTINE W REFLEX MICROSCOPIC - Abnormal; Notable for the following components:   Color, Urine ORANGE (*)    APPearance CLOUDY (*)    Hgb urine dipstick MODERATE (*)     Protein, ur 100 (*)    Leukocytes,Ua MODERATE (*)    RBC / HPF >50 (*)    WBC, UA >50 (*)    Bacteria, UA FEW (*)    All other components within normal limits  PROTIME-INR    EKG None  Radiology No results found.  Procedures Procedures    Medications Ordered in ED Medications  cefTRIAXone (ROCEPHIN) 1 g in sodium chloride 0.9 % 100 mL IVPB (0 g Intravenous Stopped 06/12/22 1907)    ED Course/ Medical Decision Making/ A&P Clinical Course as of 07/02/22 1660  Tue Jun 12, 2022  1742 Received signout from previous provider.  Briefly patient with hypertension hyperlipidemia BPH on Flomax who presents with gross hematuria.  Found to have urinary retention 368 on PVR and gross blood/hematuria with greater than 50 WBCs normal white blood cell count 6.8.  Hemoglobin 11.7 and stable not requiring transfusion.  Paged urology for consult for possibly starting CBI and order for IV Rocephin 1 g. [VB]  6301 Spoke to [place] Rao of urology who recommends renal ultrasound first and then recall urology. [VB]  [id number] Urology requests 85 French coud Foley catheter.  They will be down to evaluate the patient and provide further recommendations. [VB]  2108 Patient has been seen and evaluated by urology team.  They have placed Foley catheter and will arrange for his outpatient follow-up.  They are increasing his Flomax to 0.8 mg daily and I will discharge patient on Bactrim for 7 days.  Safe for discharge home. [VB]    Clinical Course User Index [VB] Elgie Congo, MD                           Medical Decision Making This patient presents to the ED with chief complaint(s) of hematuria with pertinent past medical history of BPH, diabetes which further complicates the presenting complaint. The complaint involves an extensive differential diagnosis and also carries with it a high risk of complications and morbidity.    The differential diagnosis includes UTI, pyelonephritis and nephrolithiasis  unlikely as the patient has no flank or back pain and no abdominal pain, no reported history of trauma, malignancy  Additional history obtained: N/A  ED Course and Reassessment: Patient's initial labs showed no significant anemia or coagulopathy.  Urine is pending at this time to evaluate for UTI Patient was signed out to  evening team pending UA and reassessment.  Independent labs interpretation:  The following labs were independently interpreted: Hemoglobin stable, normal coags  Independent visualization of imaging: N/A  Consultation: N/A     Amount and/or Complexity of Data Reviewed Radiology: ordered.  Risk Prescription drug management.           Final Clinical Impression(s) / ED Diagnoses Final diagnoses:  Hematuria, unspecified type  Urinary tract infection with hematuria, site unspecified    Rx / DC Orders ED Discharge Orders          Ordered    sulfamethoxazole-trimethoprim (BACTRIM DS) 800-160 MG tablet  2 times daily        06/12/22 2109    tamsulosin (FLOMAX) 0.4 MG CAPS capsule  Daily after supper        06/12/22 2110              [place], [place]y K, DO 07/02/22 0715

## 2022-07-17 DIAGNOSIS — N400 Enlarged prostate without lower urinary tract symptoms: Secondary | ICD-10-CM | POA: Diagnosis not present

## 2022-07-23 DIAGNOSIS — N1330 Unspecified hydronephrosis: Secondary | ICD-10-CM | POA: Diagnosis not present

## 2022-07-23 DIAGNOSIS — N401 Enlarged prostate with lower urinary tract symptoms: Secondary | ICD-10-CM | POA: Diagnosis not present

## 2022-07-23 DIAGNOSIS — R338 Other retention of urine: Secondary | ICD-10-CM | POA: Diagnosis not present

## 2022-09-10 DIAGNOSIS — R338 Other retention of urine: Secondary | ICD-10-CM | POA: Diagnosis not present

## 2022-09-10 DIAGNOSIS — R339 Retention of urine, unspecified: Secondary | ICD-10-CM | POA: Diagnosis not present

## 2022-09-11 ENCOUNTER — Other Ambulatory Visit: Payer: Self-pay | Admitting: Internal Medicine

## 2022-09-13 ENCOUNTER — Other Ambulatory Visit: Payer: Self-pay | Admitting: Internal Medicine

## 2022-11-23 ENCOUNTER — Other Ambulatory Visit: Payer: Self-pay | Admitting: Internal Medicine

## 2022-11-27 ENCOUNTER — Encounter: Payer: Self-pay | Admitting: Internal Medicine

## 2022-11-27 ENCOUNTER — Ambulatory Visit (INDEPENDENT_AMBULATORY_CARE_PROVIDER_SITE_OTHER): Payer: Medicare HMO | Admitting: Internal Medicine

## 2022-11-27 VITALS — BP 116/80 | HR 68 | Temp 97.6°F | Ht 71.5 in | Wt 190.0 lb

## 2022-11-27 DIAGNOSIS — N138 Other obstructive and reflux uropathy: Secondary | ICD-10-CM

## 2022-11-27 DIAGNOSIS — E785 Hyperlipidemia, unspecified: Secondary | ICD-10-CM

## 2022-11-27 DIAGNOSIS — N401 Enlarged prostate with lower urinary tract symptoms: Secondary | ICD-10-CM | POA: Diagnosis not present

## 2022-11-27 DIAGNOSIS — E0840 Diabetes mellitus due to underlying condition with diabetic neuropathy, unspecified: Secondary | ICD-10-CM

## 2022-11-27 DIAGNOSIS — N1831 Chronic kidney disease, stage 3a: Secondary | ICD-10-CM | POA: Diagnosis not present

## 2022-11-27 DIAGNOSIS — E1140 Type 2 diabetes mellitus with diabetic neuropathy, unspecified: Secondary | ICD-10-CM | POA: Diagnosis not present

## 2022-11-27 LAB — RENAL FUNCTION PANEL
Albumin: 4.1 g/dL (ref 3.5–5.2)
BUN: 22 mg/dL (ref 6–23)
CO2: 29 mEq/L (ref 19–32)
Calcium: 9.6 mg/dL (ref 8.4–10.5)
Chloride: 106 mEq/L (ref 96–112)
Creatinine, Ser: 1.46 mg/dL (ref 0.40–1.50)
GFR: 44.12 mL/min — ABNORMAL LOW (ref >60.00)
Glucose, Bld: 127 mg/dL — ABNORMAL HIGH (ref 70–99)
Phosphorus: 3.2 mg/dL (ref 2.3–4.6)
Potassium: 4 mEq/L (ref 3.5–5.1)
Sodium: 142 mEq/L (ref 135–145)

## 2022-11-27 LAB — POCT GLYCOSYLATED HEMOGLOBIN (HGB A1C): Hemoglobin A1C: 6.8 % — AB (ref 4.0–5.6)

## 2022-11-27 LAB — CBC
HCT: 35.1 % — ABNORMAL LOW (ref 39.0–52.0)
Hemoglobin: 11.6 g/dL — ABNORMAL LOW (ref 13.0–17.0)
MCHC: 33.1 g/dL (ref 30.0–36.0)
MCV: 86 fl (ref 78.0–100.0)
Platelets: 215 10*3/uL (ref 150.0–400.0)
RBC: 4.09 Mil/uL — ABNORMAL LOW (ref 4.22–5.81)
RDW: 14.8 % (ref 11.5–15.5)
WBC: 6.7 10*3/uL (ref 4.0–10.5)

## 2022-11-27 NOTE — Assessment & Plan Note (Signed)
Lab Results  Component Value Date   HGBA1C 6.8 (A) 11/27/2022   Doing well on metformin 500 bid Mild neuropathy without pain---no meds. Discussed using a walking stick for balance

## 2022-11-27 NOTE — Assessment & Plan Note (Signed)
Voiding okay without meds

## 2022-11-27 NOTE — Progress Notes (Signed)
Subjective:    Patient ID: Frank Carlson, male    DOB: 10/14/1941, 82 y.o.   MRN: [id number]  HPI Here for follow up of diabetes  Has noticed some tingling sensations in both legs No pain of note Does have some balance issues--no falls (but will lean against the wall at times)  Reviewed last blood work GFR again down in 40's---- 46 Not on meds for this  Does check sugar every morning Doesn't remember the numbers-- states "20 or 30" No low sugar reactions  Voids okay No meds for prostate  Current Outpatient Medications on File Prior to Visit  Medication Sig Dispense Refill   glucose blood (TRUE METRIX BLOOD GLUCOSE TEST) test strip TEST BLOOD SUGAR EVERY DAY 100 strip 10   metFORMIN (GLUCOPHAGE) 500 MG tablet TAKE 1 TABLET TWICE DAILY WITH A MEAL 180 tablet 3   Multiple Vitamin (MULTIVITAMIN WITH MINERALS) TABS tablet Take 1 tablet by mouth every morning.      Omega-3 Fatty Acids (FISH OIL PO) Take 1 capsule by mouth every morning.      simvastatin (ZOCOR) 20 MG tablet TAKE 1 TABLET AT BEDTIME. 90 tablet 3   TRUEplus Lancets 30G MISC USE TO TEST BLOOD SUGAR EVERY DAY 100 each 3   No current facility-administered medications on file prior to visit.    Allergies  Allergen Reactions   Bee Venom Other (See Comments)    Passed out    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   CONCUSSION WITH LOC OF 30 MINUTES OR LESS 11/01/2009   Diabetes mellitus without complication ([place])    TYPE 2   ED (erectile dysfunction)    HEARING LOSS, BILATERAL 11/01/2009   Wears hearing aids   Hyperlipidemia    JOINT STIFFNESS, HAND 10/28/2008   Pleural effusion 10/2019   Pneumonia    Type II or unspecified type diabetes mellitus with neurological manifestations, not stated as uncontrolled(250.60) 1/15   Type 2    Past Surgical History:  Procedure Laterality Date   CATARACT EXTRACTION  Bilateral 07/2010   OD with IOL   COLONOSCOPY W/ POLYPECTOMY     INCISION / DRAINAGE HAND / FINGER Right    INGUINAL HERNIA REPAIR Right 08/23/2015   Procedure: LAPAROSCOPIC RIGHT INGUINAL HERNIA REPAIR WITH MESH;  Surgeon: Ralene Ok, MD;  Location: [facility];  Service: General;  Laterality: Right;   INSERTION OF MESH Right 08/23/2015   Procedure: INSERTION OF MESH;  Surgeon: Ralene Ok, MD;  Location: [facility];  Service: General;  Laterality: Right;   IR THORACENTESIS ASP PLEURAL SPACE W/IMG GUIDE  10/15/2019   TONSILLECTOMY     VASECTOMY      Family History  Problem Relation Age of Onset   Alzheimer's disease Mother    Dementia Mother    Coronary artery disease Father    Heart disease Father    Coronary artery disease Other    Diabetes Other    Alzheimer's disease Sister    Dementia Brother    Alzheimer's disease Brother     Social History   Socioeconomic History   Marital status: Married    Spouse name: Lovey Newcomer   Number of children: 3   Years of education: 16   Highest education level: Not on file  Occupational History   Occupation: Therapist, occupational    Comment: Retired  Tobacco Use   Smoking status: Former  Packs/day: 1.50    Years: 5.00    Total pack years: 7.50    Types: Cigarettes    Start date: 23    Quit date: 11/25/1958    Years since quitting: 64.0    Passive exposure: Never   Smokeless tobacco: Never  Vaping Use   Vaping Use: Never used  Substance and Sexual Activity   Alcohol use: No   Drug use: No   Sexual activity: Yes    Partners: Female  Other Topics Concern   Not on file  Social History Narrative   [employer]- Estate manager/land agent.    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    Married '64. 3 sons- '66, '68, '71; 5 grandchildren, 1 step g-dtr.       Has living will   Wife is health care POA--then son Mitzi Hansen   Would accept resuscitation attempts but no prolonged ventilatory support   Probably wouldn't want prolonged  tube feeds   Social Determinants of Health   Financial Resource Strain: Low Risk  (09/22/2020)   Overall Financial Resource Strain (CARDIA)    Difficulty of Paying Living Expenses: Not hard at all  Food Insecurity: Not on file  Transportation Needs: Not on file  Physical Activity: Not on file  Stress: Not on file  Social Connections: Not on file  Intimate Partner Violence: Not on file   Review of Systems Appetite is good Weight stable Sleeps okay     Objective:   Physical Exam Constitutional:      Appearance: Normal appearance.  Cardiovascular:     Rate and Rhythm: Normal rate and regular rhythm.     Pulses: Normal pulses.     Heart sounds: No murmur heard.    No gallop.  Pulmonary:     Effort: Pulmonary effort is normal.     Breath sounds: Normal breath sounds. No wheezing or rales.  Musculoskeletal:     Cervical back: Neck supple.  Lymphadenopathy:     Cervical: No cervical adenopathy.  Skin:    Comments: No foot lesions  Neurological:     Mental Status: He is alert.  Psychiatric:        Mood and Affect: Mood normal.        Behavior: Behavior normal.            Assessment & Plan:

## 2022-11-27 NOTE — Assessment & Plan Note (Signed)
No problems with simvastatin '20mg'$  daily

## 2022-11-27 NOTE — Assessment & Plan Note (Signed)
Will recheck labs If still in 40's, will start lisinopril '5mg'$  daily and recheck after If worse, will set up with nephrologist

## 2022-12-05 ENCOUNTER — Emergency Department: Payer: Medicare HMO

## 2022-12-05 ENCOUNTER — Other Ambulatory Visit: Payer: Self-pay

## 2022-12-05 ENCOUNTER — Inpatient Hospital Stay
Admission: EM | Admit: 2022-12-05 | Discharge: 2022-12-09 | DRG: 698 | Disposition: A | Discharge status: Home / Self Care | Payer: Medicare HMO | Attending: Internal Medicine | Admitting: Internal Medicine

## 2022-12-05 DIAGNOSIS — R55 Syncope and collapse: Secondary | ICD-10-CM | POA: Diagnosis not present

## 2022-12-05 DIAGNOSIS — E1122 Type 2 diabetes mellitus with diabetic chronic kidney disease: Secondary | ICD-10-CM | POA: Diagnosis present

## 2022-12-05 DIAGNOSIS — I4510 Unspecified right bundle-branch block: Secondary | ICD-10-CM | POA: Diagnosis present

## 2022-12-05 DIAGNOSIS — D649 Anemia, unspecified: Secondary | ICD-10-CM | POA: Diagnosis present

## 2022-12-05 DIAGNOSIS — N3000 Acute cystitis without hematuria: Secondary | ICD-10-CM | POA: Diagnosis not present

## 2022-12-05 DIAGNOSIS — Z820 Family history of epilepsy and other diseases of the nervous system: Secondary | ICD-10-CM

## 2022-12-05 DIAGNOSIS — N179 Acute kidney failure, unspecified: Secondary | ICD-10-CM | POA: Diagnosis not present

## 2022-12-05 DIAGNOSIS — N136 Pyonephrosis: Secondary | ICD-10-CM | POA: Diagnosis present

## 2022-12-05 DIAGNOSIS — Z91030 Bee allergy status: Secondary | ICD-10-CM

## 2022-12-05 DIAGNOSIS — R6520 Severe sepsis without septic shock: Secondary | ICD-10-CM | POA: Diagnosis not present

## 2022-12-05 DIAGNOSIS — Z7984 Long term (current) use of oral hypoglycemic drugs: Secondary | ICD-10-CM

## 2022-12-05 DIAGNOSIS — F05 Delirium due to known physiological condition: Secondary | ICD-10-CM | POA: Diagnosis not present

## 2022-12-05 DIAGNOSIS — R451 Restlessness and agitation: Secondary | ICD-10-CM | POA: Diagnosis not present

## 2022-12-05 DIAGNOSIS — A4150 Gram-negative sepsis, unspecified: Secondary | ICD-10-CM | POA: Diagnosis present

## 2022-12-05 DIAGNOSIS — I951 Orthostatic hypotension: Secondary | ICD-10-CM | POA: Diagnosis not present

## 2022-12-05 DIAGNOSIS — N390 Urinary tract infection, site not specified: Secondary | ICD-10-CM | POA: Diagnosis present

## 2022-12-05 DIAGNOSIS — Z8249 Family history of ischemic heart disease and other diseases of the circulatory system: Secondary | ICD-10-CM | POA: Diagnosis not present

## 2022-12-05 DIAGNOSIS — E785 Hyperlipidemia, unspecified: Secondary | ICD-10-CM | POA: Diagnosis not present

## 2022-12-05 DIAGNOSIS — N1831 Chronic kidney disease, stage 3a: Secondary | ICD-10-CM | POA: Diagnosis present

## 2022-12-05 DIAGNOSIS — E860 Dehydration: Secondary | ICD-10-CM | POA: Diagnosis present

## 2022-12-05 DIAGNOSIS — G9341 Metabolic encephalopathy: Secondary | ICD-10-CM | POA: Diagnosis not present

## 2022-12-05 DIAGNOSIS — Z86010 Personal history of colonic polyps: Secondary | ICD-10-CM | POA: Diagnosis not present

## 2022-12-05 DIAGNOSIS — R296 Repeated falls: Secondary | ICD-10-CM | POA: Diagnosis present

## 2022-12-05 DIAGNOSIS — R509 Fever, unspecified: Secondary | ICD-10-CM | POA: Diagnosis not present

## 2022-12-05 DIAGNOSIS — N189 Chronic kidney disease, unspecified: Secondary | ICD-10-CM

## 2022-12-05 DIAGNOSIS — W19XXXA Unspecified fall, initial encounter: Secondary | ICD-10-CM | POA: Diagnosis not present

## 2022-12-05 DIAGNOSIS — R42 Dizziness and giddiness: Secondary | ICD-10-CM | POA: Diagnosis not present

## 2022-12-05 DIAGNOSIS — I959 Hypotension, unspecified: Secondary | ICD-10-CM | POA: Diagnosis not present

## 2022-12-05 DIAGNOSIS — F0390 Unspecified dementia without behavioral disturbance: Secondary | ICD-10-CM | POA: Diagnosis present

## 2022-12-05 DIAGNOSIS — T83511A Infection and inflammatory reaction due to indwelling urethral catheter, initial encounter: Secondary | ICD-10-CM | POA: Diagnosis not present

## 2022-12-05 DIAGNOSIS — N281 Cyst of kidney, acquired: Secondary | ICD-10-CM | POA: Diagnosis present

## 2022-12-05 DIAGNOSIS — N138 Other obstructive and reflux uropathy: Secondary | ICD-10-CM | POA: Diagnosis not present

## 2022-12-05 DIAGNOSIS — N1330 Unspecified hydronephrosis: Secondary | ICD-10-CM | POA: Diagnosis not present

## 2022-12-05 DIAGNOSIS — Z833 Family history of diabetes mellitus: Secondary | ICD-10-CM | POA: Diagnosis not present

## 2022-12-05 DIAGNOSIS — T1490XA Injury, unspecified, initial encounter: Secondary | ICD-10-CM | POA: Diagnosis not present

## 2022-12-05 DIAGNOSIS — Z79899 Other long term (current) drug therapy: Secondary | ICD-10-CM

## 2022-12-05 DIAGNOSIS — Z87891 Personal history of nicotine dependence: Secondary | ICD-10-CM | POA: Diagnosis not present

## 2022-12-05 DIAGNOSIS — B952 Enterococcus as the cause of diseases classified elsewhere: Secondary | ICD-10-CM | POA: Diagnosis present

## 2022-12-05 DIAGNOSIS — N401 Enlarged prostate with lower urinary tract symptoms: Secondary | ICD-10-CM | POA: Diagnosis not present

## 2022-12-05 DIAGNOSIS — J9811 Atelectasis: Secondary | ICD-10-CM | POA: Diagnosis not present

## 2022-12-05 LAB — COMPREHENSIVE METABOLIC PANEL
ALT: 21 U/L (ref 0–44)
AST: 25 U/L (ref 15–41)
Albumin: 3.2 g/dL — ABNORMAL LOW (ref 3.5–5.0)
Alkaline Phosphatase: 40 U/L (ref 38–126)
Anion gap: 7 (ref 5–15)
BUN: 29 mg/dL — ABNORMAL HIGH (ref 8–23)
CO2: 23 mmol/L (ref 22–32)
Calcium: 8.4 mg/dL — ABNORMAL LOW (ref 8.9–10.3)
Chloride: 107 mmol/L (ref 98–111)
Creatinine, Ser: 1.52 mg/dL — ABNORMAL HIGH (ref 0.61–1.24)
GFR, Estimated: 45 mL/min — ABNORMAL LOW (ref >60)
Glucose, Bld: 194 mg/dL — ABNORMAL HIGH (ref 70–99)
Potassium: 3.8 mmol/L (ref 3.5–5.1)
Sodium: 137 mmol/L (ref 135–145)
Total Bilirubin: 0.6 mg/dL (ref 0.3–1.2)
Total Protein: 7.3 g/dL (ref 6.5–8.1)

## 2022-12-05 LAB — CBC WITH DIFFERENTIAL/PLATELET
Abs Immature Granulocytes: 0.07 10*3/uL (ref 0.00–0.07)
Basophils Absolute: 0 10*3/uL (ref 0.0–0.1)
Basophils Relative: 0 %
Eosinophils Absolute: 0 10*3/uL (ref 0.0–0.5)
Eosinophils Relative: 0 %
HCT: 32.2 % — ABNORMAL LOW (ref 39.0–52.0)
Hemoglobin: 10.4 g/dL — ABNORMAL LOW (ref 13.0–17.0)
Immature Granulocytes: 0 %
Lymphocytes Relative: 7 %
Lymphs Abs: 1.2 10*3/uL (ref 0.7–4.0)
MCH: 28 pg (ref 26.0–34.0)
MCHC: 32.3 g/dL (ref 30.0–36.0)
MCV: 86.8 fL (ref 80.0–100.0)
Monocytes Absolute: 2.1 10*3/uL — ABNORMAL HIGH (ref 0.1–1.0)
Monocytes Relative: 12 %
Neutro Abs: 14.8 10*3/uL — ABNORMAL HIGH (ref 1.7–7.7)
Neutrophils Relative %: 81 %
Platelets: 171 10*3/uL (ref 150–400)
RBC: 3.71 MIL/uL — ABNORMAL LOW (ref 4.22–5.81)
RDW: 14.2 % (ref 11.5–15.5)
WBC: 18.2 10*3/uL — ABNORMAL HIGH (ref 4.0–10.5)
nRBC: 0 % (ref 0.0–0.2)

## 2022-12-05 LAB — URINALYSIS, ROUTINE W REFLEX MICROSCOPIC
Bilirubin Urine: NEGATIVE
Glucose, UA: NEGATIVE mg/dL
Ketones, ur: NEGATIVE mg/dL
Nitrite: NEGATIVE
Protein, ur: 30 mg/dL — AB
Specific Gravity, Urine: 1.011 (ref 1.005–1.030)
Squamous Epithelial / HPF: NONE SEEN /HPF (ref 0–5)
WBC, UA: >50 WBC/hpf — ABNORMAL HIGH (ref 0–5)
pH: 5 (ref 5.0–8.0)

## 2022-12-05 LAB — CULTURE, BLOOD (ROUTINE X 2)

## 2022-12-05 LAB — LACTIC ACID, PLASMA: Lactic Acid, Venous: 1.4 mmol/L (ref 0.5–1.9)

## 2022-12-05 LAB — TROPONIN I (HIGH SENSITIVITY): Troponin I (High Sensitivity): 11 ng/L (ref <18)

## 2022-12-05 LAB — CBG MONITORING, ED: Glucose-Capillary: 181 mg/dL — ABNORMAL HIGH (ref 70–99)

## 2022-12-05 MED ORDER — SODIUM CHLORIDE 0.9 % IV SOLN
1.0000 g | Freq: Once | INTRAVENOUS | Status: AC
  Administered 2022-12-05: 1 g via INTRAVENOUS
  Filled 2022-12-05: qty 10

## 2022-12-05 MED ORDER — SODIUM CHLORIDE 0.9 % IV SOLN
2.0000 g | INTRAVENOUS | Status: DC
  Administered 2022-12-06: 2 g via INTRAVENOUS
  Filled 2022-12-05: qty 20

## 2022-12-05 MED ORDER — ACETAMINOPHEN 650 MG RE SUPP: 650.0000 mg | Freq: Four times a day (QID) | RECTAL | Status: DC | PRN

## 2022-12-05 MED ORDER — SODIUM CHLORIDE 0.9 % IV BOLUS
1000.0000 mL | Freq: Once | INTRAVENOUS | Status: AC
  Administered 2022-12-05: 1000 mL via INTRAVENOUS

## 2022-12-05 MED ORDER — ONDANSETRON HCL 4 MG PO TABS: 4.0000 mg | ORAL_TABLET | Freq: Four times a day (QID) | ORAL | Status: DC | PRN

## 2022-12-05 MED ORDER — ENOXAPARIN SODIUM 40 MG/0.4ML IJ SOSY
40.0000 mg | PREFILLED_SYRINGE | INTRAMUSCULAR | Status: DC
  Administered 2022-12-06 – 2022-12-09 (×3): 40 mg via SUBCUTANEOUS
  Filled 2022-12-05 (×3): qty 0.4

## 2022-12-05 MED ORDER — MAGNESIUM HYDROXIDE 400 MG/5ML PO SUSP: 30.0000 mL | Freq: Every day | ORAL | Status: DC | PRN

## 2022-12-05 MED ORDER — ONDANSETRON HCL 4 MG/2ML IJ SOLN: 4.0000 mg | Freq: Four times a day (QID) | INTRAMUSCULAR | Status: DC | PRN

## 2022-12-05 MED ORDER — ADULT MULTIVITAMIN W/MINERALS CH
1.0000 | ORAL_TABLET | Freq: Every morning | ORAL | Status: DC
  Administered 2022-12-06 – 2022-12-09 (×3): 1 via ORAL
  Filled 2022-12-05 (×3): qty 1

## 2022-12-05 MED ORDER — SODIUM CHLORIDE 0.9 % IV SOLN: INTRAVENOUS | Status: DC

## 2022-12-05 MED ORDER — TRAZODONE HCL 50 MG PO TABS
25.0000 mg | ORAL_TABLET | Freq: Every evening | ORAL | Status: DC | PRN
  Administered 2022-12-09: 25 mg via ORAL
  Filled 2022-12-05: qty 1

## 2022-12-05 MED ORDER — SIMVASTATIN 20 MG PO TABS
20.0000 mg | ORAL_TABLET | Freq: Every day | ORAL | Status: DC
  Administered 2022-12-05 – 2022-12-08 (×3): 20 mg via ORAL
  Filled 2022-12-05 (×2): qty 1
  Filled 2022-12-05: qty 2

## 2022-12-05 MED ORDER — ACETAMINOPHEN 325 MG PO TABS: 650.0000 mg | ORAL_TABLET | Freq: Four times a day (QID) | ORAL | Status: DC | PRN

## 2022-12-05 MED ORDER — OMEGA-3-ACID ETHYL ESTERS 1 G PO CAPS
1.0000 g | ORAL_CAPSULE | Freq: Every day | ORAL | Status: DC
  Administered 2022-12-06: 1 g via ORAL
  Filled 2022-12-05: qty 1

## 2022-12-05 NOTE — ED Provider Notes (Signed)
[facility] Provider Note    Event Date/Time   First MD Initiated Contact with Patient 12/05/22 1641     (approximate)   History   Fall and Dizziness   HPI  Frank Carlson is a 82 y.o. male with past medical history significant for diabetes who presents to the emergency department following multiple falls.  Patient states that he was in his normal state of health yesterday and does not ambulate with a walker or any assistive devices.  States this morning whenever he woke up he just felt discoordinated.  He attempted to in and out catheter for his urine which he does on a regular basis and states that his arms were not coordinating with what he needed to do.  Attempted to ambulate multiple times and states that he felt like his legs were Jell-O.  EMS stated that his blood pressure was normal at rest when he stood up though his blood pressure with decrease in significantly.  Denies any recent falls or head trauma earlier than today.  Not on anticoagulation.  Denies any focal extremity numbness or weakness in his upper or lower extremities.  States that he does not feel weak at rest.  Denies any chest pain or shortness of breath.  Denies abdominal pain, nausea vomiting or diarrhea.  Denies any new medications.     Physical Exam   Triage Vital Signs: ED Triage Vitals  Enc Vitals Group     BP 12/05/22 1549 (!) 140/85     Pulse Rate 12/05/22 1549 83     Resp 12/05/22 1549 16     Temp 12/05/22 1549 99.1 F (37.3 C)     Temp Source 12/05/22 1549 Oral     SpO2 12/05/22 1549 99 %     Weight 12/05/22 1550 190 lb 0.6 oz (86.2 kg)     Height 12/05/22 1550 '5\' 11"'$  (1.803 m)     Head Circumference --      Peak Flow --      Pain Score 12/05/22 1549 0     Pain Loc --      Pain Edu? --      Excl. in [place]? --     Most recent vital signs: Vitals:   12/05/22 1930 12/05/22 2100  BP: 120/68 112/61  Pulse: 73 78  Resp: 12 (!) 21  Temp: 99.7 F (37.6 C)   SpO2: 97%  95%    Physical Exam Constitutional:      Appearance: He is well-developed.  HENT:     Head: Atraumatic.  Eyes:     Extraocular Movements: Extraocular movements intact.     Conjunctiva/sclera: Conjunctivae normal.     Pupils: Pupils are equal, round, and reactive to light.     Comments: No nystagmus  Cardiovascular:     Rate and Rhythm: Regular rhythm.  Pulmonary:     Effort: No respiratory distress.  Musculoskeletal:     Cervical back: Normal range of motion.  Skin:    General: Skin is warm.  Neurological:     Mental Status: He is alert. Mental status is at baseline.     Comments: Cranial nerves grossly intact.  5/5 strength bilateral upper and lower extremities.  Sensation intact in upper and lower extremities.  Unstable gait, unable to stand due to bilateral weakness.  No dysmetria or past-pointing.     IMPRESSION / MDM / ASSESSMENT AND PLAN / ED COURSE  I reviewed the triage vital signs and the nursing  notes.  Differential diagnosis including dehydration, electrolyte abnormality, sepsis, urinary tract infection, ACS, dysrhythmia, CVA   EKG  I, Nathaniel Man, the attending physician, personally viewed and interpreted this ECG.   Rate: Normal  Rhythm: Normal sinus  Axis: Normal  Intervals: Incomplete right bundle branch block  ST&T Change: None  No tachycardic or bradycardic dysrhythmias while on cardiac telemetry.  RADIOLOGY I independently reviewed imaging, my interpretation of imaging: Chest x-ray no focal findings consistent with pneumonia.  Read as no acute findings.  CT scan of the head with no acute findings  LABS (all labs ordered are listed, but only abnormal results are displayed) Labs interpreted as -  Leukocytosis of 18.2.  Mild anemia with hemoglobin of 10.4 but no signs or symptoms of GI bleed.  Slowly downtrending hemoglobin on chart review.  Creatinine appears to be at baseline.  Labs Reviewed  URINALYSIS, ROUTINE W REFLEX MICROSCOPIC -  Abnormal; Notable for the following components:      Result Value   Color, Urine YELLOW (*)    APPearance TURBID (*)    Hgb urine dipstick SMALL (*)    Protein, ur 30 (*)    Leukocytes,Ua LARGE (*)    WBC, UA >50 (*)    Bacteria, UA FEW (*)    All other components within normal limits  COMPREHENSIVE METABOLIC PANEL - Abnormal; Notable for the following components:   Glucose, Bld 194 (*)    BUN 29 (*)    Creatinine, Ser 1.52 (*)    Calcium 8.4 (*)    Albumin 3.2 (*)    GFR, Estimated 45 (*)    All other components within normal limits  CBC WITH DIFFERENTIAL/PLATELET - Abnormal; Notable for the following components:   WBC 18.2 (*)    RBC 3.71 (*)    Hemoglobin 10.4 (*)    HCT 32.2 (*)    Neutro Abs 14.8 (*)    Monocytes Absolute 2.1 (*)    All other components within normal limits  CBG MONITORING, ED - Abnormal; Notable for the following components:   Glucose-Capillary 181 (*)    All other components within normal limits  CULTURE, BLOOD (ROUTINE X 2)  CULTURE, BLOOD (ROUTINE X 2)  URINE CULTURE  LACTIC ACID, PLASMA  CBG MONITORING, ED  TROPONIN I (HIGH SENSITIVITY)    TREATMENT    Given significantly orthostatic blood cultures added on given concern for possible sepsis given his leukocytosis empirically started on IV Rocephin given strong concern for urinary tract infection.  Patient did not meet criteria for 30 cc/kg of IV fluids.  UA with signs of urinary tract infection.  Consulted hospitalist given concern of sepsis secondary to complicated UTI.  PROCEDURES:  Critical Care performed: Yes  .Critical Care  Performed by: Nathaniel Man, MD Authorized by: Nathaniel Man, MD   Critical care provider statement:    Critical care time (minutes):  30   Critical care time was exclusive of:  Separately billable procedures and treating other patients   Critical care was necessary to treat or prevent imminent or life-threatening deterioration of the following conditions:   Sepsis   Critical care was time spent personally by me on the following activities:  Development of treatment plan with patient or surrogate, discussions with consultants, evaluation of patient's response to treatment, examination of patient, ordering and review of laboratory studies, ordering and review of radiographic studies, ordering and performing treatments and interventions, pulse oximetry, re-evaluation of patient's condition and review of old charts   Patient's  presentation is most consistent with acute presentation with potential threat to life or bodily function.   MEDICATIONS ORDERED IN ED: Medications  sodium chloride 0.9 % bolus 1,000 mL (0 mLs Intravenous Stopped 12/05/22 1930)  cefTRIAXone (ROCEPHIN) 1 g in sodium chloride 0.9 % 100 mL IVPB (0 g Intravenous Stopped 12/05/22 2005)    FINAL CLINICAL IMPRESSION(S) / ED DIAGNOSES   Final diagnoses:  Acute cystitis without hematuria  Syncope, unspecified syncope type  Fall, initial encounter  Orthostatic hypotension     Rx / DC Orders   ED Discharge Orders     None        Note:  This document was prepared using Dragon voice recognition software and may include unintentional dictation errors.   Nathaniel Man, MD 12/05/22 2126

## 2022-12-05 NOTE — ED Notes (Signed)
Patient transported to CT 

## 2022-12-05 NOTE — ED Notes (Signed)
MD Mansy in with patient.

## 2022-12-05 NOTE — ED Triage Notes (Signed)
Pt comes in from home via [facility] with complaints of multiple falls due to dizziness. According to EMS the patient has multiple unwitnessed falls. Pt recalls getting up with falls, but not recalling exactly how each fall occurred. Pt did state that this morning, he got up to self cath himself and felt very dizzy and was unable to cath.  Pt has an abrasion to the right side of his forehead and to the bridge of his nose.    Pt is now alert and oriented x4, and has no signs of distress. Pt has a history of type 2 diabetes, and hypertension.  Pt received 1000 mL of NS in route, and has an 18 in the Left Forearm.  EMS VITALS: 101.5 T 140/56 sitting 97/55 standing Blood sugar: 216

## 2022-12-05 NOTE — H&P (Incomplete)
[name]   PATIENT NAME: Frank Carlson    MR#:  [id number]  DATE OF BIRTH:  Dec 14, 1940  DATE OF ADMISSION:  12/05/2022  PRIMARY CARE PHYSICIAN: Venia Carbon, MD   Patient is coming from: Home  REQUESTING/REFERRING PHYSICIAN: Nathaniel Man, MD  CHIEF COMPLAINT:   Chief Complaint  Patient presents with   Fall   Dizziness    HISTORY OF PRESENT ILLNESS:  Frank Carlson is a 82 y.o. Caucasian male with medical history significant for type 2 diabetes mellitus, dyslipidemia and BPH, who presented to the emergency room with acute onset of dizziness and fall when he was trying to get up and stand.  He self catheterizes and noticed urine has been cloudy.  He has a history of recurrent falls.  Usually ambulates without assistance.  He felt discoordinated this morning.  He felt his legs were like Jell-O when he tried to ambulate several times.  He was orthostatic for EMS.  He denies any paresthesias or focal muscle weakness.  No nausea or vomiting or abdominal pain.  He admits to urinary frequency and dysuria.  No fever or chills.  No cough or dyspnea.  No chest pain or palpitations.  ED Course: When he came to the ER, temperature was 99.1 and blood pressure 140/85 with otherwise normal vital signs.  Labs revealed a BUN of 29 and creatinine 1.52 above previous levels with calcium 8.4 and glucose 194 and albumin 3.2.  CBC showed leukocytosis of 18.2 with neutrophilia and anemia.  Lactic acid was 1.4.  Blood cultures were drawn.Frank Carlson  UA was positive for UTI EKG as reviewed by me : Normal sinus rhythm with a rate of 85 with supraventricular bigeminy and incomplete right bundle branch block Imaging: Two-view chest x-ray showed old granulomatous disease and increased right basal atelectasis.  Noncontrasted CT scan revealed no acute intracranial normalities.  The patient was given a gram of IV Rocephin and 1 L bolus of IV normal saline.  He will be admitted to a medical telemetry bed for  further evaluation and management. 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   CONCUSSION WITH LOC OF 30 MINUTES OR LESS 11/01/2009   Diabetes mellitus without complication ([place])    TYPE 2   ED (erectile dysfunction)    HEARING LOSS, BILATERAL 11/01/2009   Wears hearing aids   Hyperlipidemia    JOINT STIFFNESS, HAND 10/28/2008   Pleural effusion 10/2019   Pneumonia    Type II or unspecified type diabetes mellitus with neurological manifestations, not stated as uncontrolled(250.60) 1/15   Type 2    PAST SURGICAL HISTORY:   Past Surgical History:  Procedure Laterality Date   CATARACT EXTRACTION Bilateral 07/2010   OD with IOL   COLONOSCOPY W/ POLYPECTOMY     INCISION / DRAINAGE HAND / FINGER Right    INGUINAL HERNIA REPAIR Right 08/23/2015   Procedure: LAPAROSCOPIC RIGHT INGUINAL HERNIA REPAIR WITH MESH;  Surgeon: Ralene Ok, MD;  Location: [facility];  Service: General;  Laterality: Right;   INSERTION OF MESH Right 08/23/2015   Procedure: INSERTION OF MESH;  Surgeon: Ralene Ok, MD;  Location: [facility];  Service: General;  Laterality: Right;   IR THORACENTESIS ASP PLEURAL SPACE W/IMG GUIDE  10/15/2019   TONSILLECTOMY     VASECTOMY      SOCIAL HISTORY:   Social  History   Tobacco Use   Smoking status: Former    Packs/day: 1.50    Years: 5.00    Total pack years: 7.50    Types: Cigarettes    Start date: 49    Quit date: 11/25/1958    Years since quitting: 64.0    Passive exposure: Never   Smokeless tobacco: Never  Substance Use Topics   Alcohol use: No    FAMILY HISTORY:   Family History  Problem Relation Age of Onset   Alzheimer's disease Mother    Dementia Mother    Coronary artery disease Father    Heart disease Father    Coronary artery disease Other    Diabetes Other    Alzheimer's disease Sister    Dementia Brother     Alzheimer's disease Brother     DRUG ALLERGIES:   Allergies  Allergen Reactions   Bee Venom Other (See Comments)    Passed out    REVIEW OF SYSTEMS:   ROS As per history of present illness. All pertinent systems were reviewed above. Constitutional, HEENT, cardiovascular, respiratory, GI, GU, musculoskeletal, neuro, psychiatric, endocrine, integumentary and hematologic systems were reviewed and are otherwise negative/unremarkable except for positive findings mentioned above in the HPI.   MEDICATIONS AT HOME:   Prior to Admission medications   Medication Sig Start Date End Date Taking? Authorizing Provider  glucose blood (TRUE METRIX BLOOD GLUCOSE TEST) test strip TEST BLOOD SUGAR EVERY DAY 09/11/22   Viviana Simpler I, MD  metFORMIN (GLUCOPHAGE) 500 MG tablet TAKE 1 TABLET TWICE DAILY WITH A MEAL 11/23/22   Venia Carbon, MD  Multiple Vitamin (MULTIVITAMIN WITH MINERALS) TABS tablet Take 1 tablet by mouth every morning.     [provider]  Omega-3 Fatty Acids (FISH OIL PO) Take 1 capsule by mouth every morning.     [provider]  simvastatin (ZOCOR) 20 MG tablet TAKE 1 TABLET AT BEDTIME. 07/02/22   Viviana Simpler I, MD  TRUEplus Lancets 30G MISC USE TO TEST BLOOD SUGAR EVERY DAY 09/13/22   Venia Carbon, MD      VITAL SIGNS:  Blood pressure (!) 101/46, pulse 61, temperature 99.6 F (37.6 C), temperature source Oral, resp. rate 18, height '5\' 11"'$  (1.803 m), weight 86.2 kg, SpO2 98 %.  PHYSICAL EXAMINATION:  Physical Exam  GENERAL:  82 y.o.-year-old Caucasian male patient lying in the bed with no acute distress.  EYES: Pupils equal, round, reactive to light and accommodation. No scleral icterus. Extraocular muscles intact.  HEENT: Head with nasal abrasions otherwise atraumatic, normocephalic. Oropharynx with slightly dry mucous membrane and tongue and nasopharynx clear.  NECK:  Supple, no jugular venous distention. No thyroid enlargement, no tenderness.   LUNGS: Normal breath sounds bilaterally, no wheezing, rales,rhonchi or crepitation. No use of accessory muscles of respiration.  CARDIOVASCULAR: Regular rate and rhythm, S1, S2 normal. No murmurs, rubs, or gallops.  ABDOMEN: Soft, nondistended, nontender. Bowel sounds present. No organomegaly or mass.  EXTREMITIES: No pedal edema, cyanosis, or clubbing.  NEUROLOGIC: Cranial nerves II through XII are intact. Muscle strength 5/5 in all extremities. Sensation intact. Gait not checked.  PSYCHIATRIC: The patient is alert and oriented x 3.  Normal affect and good eye contact. SKIN: No obvious rash, lesion, or ulcer.   LABORATORY PANEL:   CBC Recent Labs  Lab 12/05/22 1648  WBC 18.2*  HGB 10.4*  HCT 32.2*  PLT 171   ------------------------------------------------------------------------------------------------------------------  Chemistries  Recent Labs  Lab 12/05/22 1648  NA 137  K 3.8  CL 107  CO2 23  GLUCOSE 194*  BUN 29*  CREATININE 1.52*  CALCIUM 8.4*  AST 25  ALT 21  ALKPHOS 40  BILITOT 0.6   ------------------------------------------------------------------------------------------------------------------  Cardiac Enzymes No results for input(s): "TROPONINI" in the last 168 hours. ------------------------------------------------------------------------------------------------------------------  RADIOLOGY:  CT Head Wo Contrast  Result Date: 12/05/2022 CLINICAL DATA:  Head trauma, minor (Age >= 65y). Dizziness with multiple unwitnessed falls. EXAM: CT HEAD WITHOUT CONTRAST TECHNIQUE: Contiguous axial images were obtained from the base of the skull through the vertex without intravenous contrast. RADIATION DOSE REDUCTION: This exam was performed according to the departmental dose-optimization program which includes automated exposure control, adjustment of the mA and/or kV according to patient size and/or use of iterative reconstruction technique. COMPARISON:  Head CT  and MRI 08/06/2020 FINDINGS: Brain: There is no evidence of an acute infarct, intracranial hemorrhage, midline shift, or extra-axial fluid collection. A large right middle cranial fossa arachnoid cyst is unchanged. There is mild cerebral atrophy. There is unchanged diffuse dural calcification over the right cerebral convexity. Vascular: Calcified atherosclerosis at the skull base. No hyperdense vessel. Skull: No acute fracture or suspicious osseous lesion. Sinuses/Orbits: Visualized paranasal sinuses and mastoid air cells are clear. Bilateral cataract extraction. Other: None. IMPRESSION: No evidence of acute intracranial abnormality. Electronically Signed   By: Logan Bores M.D.   On: 12/05/2022 17:53   DG Chest 2 View  Result Date: 12/05/2022 CLINICAL DATA:  Multiple falls, dizziness EXAM: CHEST - 2 VIEW COMPARISON:  08/06/2020 FINDINGS: Normal heart size, mediastinal contours, and pulmonary vascularity. Calcified granulomata LEFT lung. Increased RIGHT basilar atelectasis. Remaining lungs clear. No acute infiltrate, pleural effusion, or pneumothorax. No osseous abnormalities. IMPRESSION: Old granulomatous disease and increased RIGHT basilar atelectasis. Electronically Signed   By: Lavonia Dana M.D.   On: 12/05/2022 17:15      IMPRESSION AND PLAN:  Assessment and Plan: * Sepsis due to gram-negative UTI ([facility]) - This is manifested by leukocytosis and brief tachypnea of 21. -The patient will be admitted to a medical telemetry bed. - We will continue antibiotic therapy with IV Rocephin. - We will continue hydration with IV normal saline. - We will follow blood and urine cultures.  Orthostatic hypotension - This is clearly due to  volume depletion and dehydration. - It is likely the culprit for his fall and possibly recurrent falls. - Will follow orthostatics with hydration.  Acute kidney injury superimposed on chronic kidney disease ([place]) - This is AKI on CKD stage 3A. - We will continue with IV  normal saline and follow BMP. - We will hold off nephrotoxins.  Dyslipidemia - We will continue statin therapy and Lovaza.  BPH with obstruction/lower urinary tract symptoms - We will continue Flomax.  Type 2 diabetes mellitus with chronic kidney disease ([place]) - The patient will be placed on supplemental coverage with NovoLog. - We will hold off metformin specially given AKI.       DVT prophylaxis: Lovenox.  Advanced Care Planning:  Code Status: full code.  Family Communication:  The plan of care was discussed in details with the patient (and family). I answered all questions. The patient agreed to proceed with the above mentioned plan. Further management will depend upon hospital course. Disposition Plan: Back to previous home environment Consults called: none.  All the records are reviewed and case discussed with ED provider.  Status is: Inpatient   At the time of the admission, it appears that the appropriate admission  status for this patient is inpatient.  This is judged to be reasonable and necessary in order to provide the required intensity of service to ensure the patient's safety given the presenting symptoms, physical exam findings and initial radiographic and laboratory data in the context of comorbid conditions.  The patient requires inpatient status due to high intensity of service, high risk of further deterioration and high frequency of surveillance required.  I certify that at the time of admission, it is my clinical judgment that the patient will require inpatient hospital care extending more than 2 midnights.                            Dispo: The patient is from: Home              Anticipated d/c is to: Home              Patient currently is not medically stable to d/c.              Difficult to place patient: No  Christel Mormon M.D on 12/06/2022 at 1:56 AM  [facility]   From 7 PM-7 AM, contact night-coverage [email or web address]  CC: Primary care physician;  Venia Carbon, MD

## 2022-12-05 NOTE — ED Notes (Signed)
Pt prima-fit not in the proper place and the bed noted to be saturated. I&O cath completed. 445m obtained. Pt stood in place and linens changed along with chucks. Monitor in place. CB in reach.

## 2022-12-06 ENCOUNTER — Inpatient Hospital Stay: Payer: Medicare HMO

## 2022-12-06 DIAGNOSIS — I951 Orthostatic hypotension: Secondary | ICD-10-CM

## 2022-12-06 DIAGNOSIS — E1122 Type 2 diabetes mellitus with diabetic chronic kidney disease: Secondary | ICD-10-CM | POA: Insufficient documentation

## 2022-12-06 DIAGNOSIS — E785 Hyperlipidemia, unspecified: Secondary | ICD-10-CM

## 2022-12-06 DIAGNOSIS — A4150 Gram-negative sepsis, unspecified: Secondary | ICD-10-CM | POA: Diagnosis not present

## 2022-12-06 DIAGNOSIS — N189 Chronic kidney disease, unspecified: Secondary | ICD-10-CM

## 2022-12-06 DIAGNOSIS — N390 Urinary tract infection, site not specified: Secondary | ICD-10-CM | POA: Diagnosis not present

## 2022-12-06 LAB — BLOOD CULTURE ID PANEL (REFLEXED) - BCID2

## 2022-12-06 LAB — BASIC METABOLIC PANEL
Anion gap: 4 — ABNORMAL LOW (ref 5–15)
BUN: 23 mg/dL (ref 8–23)
CO2: 23 mmol/L (ref 22–32)
Calcium: 7.9 mg/dL — ABNORMAL LOW (ref 8.9–10.3)
Chloride: 114 mmol/L — ABNORMAL HIGH (ref 98–111)
Creatinine, Ser: 1.29 mg/dL — ABNORMAL HIGH (ref 0.61–1.24)
GFR, Estimated: 55 mL/min — ABNORMAL LOW (ref >60)
Glucose, Bld: 139 mg/dL — ABNORMAL HIGH (ref 70–99)
Potassium: 3.4 mmol/L — ABNORMAL LOW (ref 3.5–5.1)
Sodium: 141 mmol/L (ref 135–145)

## 2022-12-06 LAB — GLUCOSE, CAPILLARY
Glucose-Capillary: 189 mg/dL — ABNORMAL HIGH (ref 70–99)
Glucose-Capillary: 117 mg/dL — ABNORMAL HIGH (ref 70–99)
Glucose-Capillary: 121 mg/dL — ABNORMAL HIGH (ref 70–99)

## 2022-12-06 LAB — CBC
HCT: 29.6 % — ABNORMAL LOW (ref 39.0–52.0)
Hemoglobin: 9.5 g/dL — ABNORMAL LOW (ref 13.0–17.0)
MCH: 28.1 pg (ref 26.0–34.0)
MCHC: 32.1 g/dL (ref 30.0–36.0)
MCV: 87.6 fL (ref 80.0–100.0)
Platelets: 178 10*3/uL (ref 150–400)
RBC: 3.38 MIL/uL — ABNORMAL LOW (ref 4.22–5.81)
RDW: 14.2 % (ref 11.5–15.5)
WBC: 14 10*3/uL — ABNORMAL HIGH (ref 4.0–10.5)
nRBC: 0 % (ref 0.0–0.2)

## 2022-12-06 LAB — CORTISOL-AM, BLOOD: Cortisol - AM: 15.4 ug/dL (ref 6.7–22.6)

## 2022-12-06 LAB — CBG MONITORING, ED
Glucose-Capillary: 147 mg/dL — ABNORMAL HIGH (ref 70–99)
Glucose-Capillary: 124 mg/dL — ABNORMAL HIGH (ref 70–99)

## 2022-12-06 LAB — PROCALCITONIN: Procalcitonin: 0.35 ng/mL

## 2022-12-06 LAB — PROTIME-INR
INR: 1.3 — ABNORMAL HIGH (ref 0.8–1.2)
Prothrombin Time: 15.5 seconds — ABNORMAL HIGH (ref 11.4–15.2)

## 2022-12-06 MED ORDER — INSULIN ASPART 100 UNIT/ML IJ SOLN
0.0000 [IU] | Freq: Three times a day (TID) | INTRAMUSCULAR | Status: DC
  Administered 2022-12-06: 1 [IU] via SUBCUTANEOUS
  Administered 2022-12-06: 2 [IU] via SUBCUTANEOUS
  Administered 2022-12-08 – 2022-12-09 (×2): 1 [IU] via SUBCUTANEOUS
  Filled 2022-12-06 (×3): qty 1

## 2022-12-06 MED ORDER — TAMSULOSIN HCL 0.4 MG PO CAPS
0.8000 mg | ORAL_CAPSULE | Freq: Every day | ORAL | Status: DC
  Administered 2022-12-06 – 2022-12-09 (×3): 0.8 mg via ORAL
  Filled 2022-12-06 (×3): qty 2

## 2022-12-06 MED ORDER — SODIUM CHLORIDE 0.9 % IV SOLN
2.0000 g | Freq: Two times a day (BID) | INTRAVENOUS | Status: DC
  Administered 2022-12-06 – 2022-12-08 (×4): 2 g via INTRAVENOUS
  Filled 2022-12-06: qty 2
  Filled 2022-12-06: qty 12.5
  Filled 2022-12-06: qty 2
  Filled 2022-12-06 (×2): qty 12.5

## 2022-12-06 NOTE — Consult Note (Signed)
[facility]  Total days of antibiotics 2         Reason for Consult: enterobacter bacteremia, urinary source   Referring Physician: patel  Principal Problem:   Sepsis due to gram-negative UTI ([place]) Active Problems:   BPH with obstruction/lower urinary tract symptoms   Acute kidney injury superimposed on chronic kidney disease ([place])   Dyslipidemia   Type 2 diabetes mellitus with chronic kidney disease (HCC)   Orthostatic hypotension    HPI: Frank Carlson is a 82 y.o. male hx of BPH, T2DM, CKD 3, who self catheterizes Q 6hr who noticed on the day of admit having unintentional falls at home, feeling weak while doing self catheterizations. He denies having fevers, or chills, but mostly dizziness/falls/weakness. On evaluation in the ED, he had leukocytosis of 18K, ua did show pyuria. Due to concern for cystitis as cause of his falls, he was admitted and empirically started on ceftriaxone. Infectious work up of urine and blood cx - showed + enterobacter on BCID. He states he doesn't feel so bad now. His wbc has improved to 14K, and he remains afebrile.  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   CONCUSSION WITH LOC OF 30 MINUTES OR LESS 11/01/2009   Diabetes mellitus without complication ([place])    TYPE 2   ED (erectile dysfunction)    HEARING LOSS, BILATERAL 11/01/2009   Wears hearing aids   Hyperlipidemia    JOINT STIFFNESS, HAND 10/28/2008   Pleural effusion 10/2019   Pneumonia    Type II or unspecified type diabetes mellitus with neurological manifestations, not stated as uncontrolled(250.60) 1/15   Type 2    Allergies:  Allergies  Allergen Reactions   Bee Venom Other (See Comments)    Passed out    MEDICATIONS:  enoxaparin (LOVENOX) injection  40 mg Subcutaneous Q24H   insulin aspart  0-9 Units Subcutaneous TID WC   multivitamin with minerals   1 tablet Oral q morning   simvastatin  20 mg Oral QHS   tamsulosin  0.8 mg Oral Daily    Social History   Tobacco Use   Smoking status: Former    Packs/day: 1.50    Years: 5.00    Total pack years: 7.50    Types: Cigarettes    Start date: 39    Quit date: 11/25/1958    Years since quitting: 64.0    Passive exposure: Never   Smokeless tobacco: Never  Vaping Use   Vaping Use: Never used  Substance Use Topics   Alcohol use: No   Drug use: No    Family History  Problem Relation Age of Onset   Alzheimer's disease Mother    Dementia Mother    Coronary artery disease Father    Heart disease Father    Coronary artery disease Other    Diabetes Other    Alzheimer's disease Sister    Dementia Brother    Alzheimer's disease Brother     Review of Systems  Constitutional: Negative for fever, chills, diaphoresis, activity change, appetite change, fatigue and unexpected weight change.  HENT: Negative for congestion, sore throat, rhinorrhea, sneezing, trouble swallowing and sinus pressure.  Eyes: Negative for photophobia and visual disturbance.  Respiratory: Negative for cough, chest tightness, shortness of breath, wheezing and stridor.  Cardiovascular: Negative for chest pain, palpitations and leg swelling.  Gastrointestinal: Negative for nausea,  vomiting, abdominal pain, diarrhea, constipation, blood in stool, abdominal distention and anal bleeding.  Genitourinary: Negative for dysuria, hematuria, flank pain and difficulty urinating.  Musculoskeletal: Negative for myalgias, back pain, joint swelling, arthralgias and gait problem.  Skin: Negative for color change, pallor, rash and wound.  Neurological: + for dizziness, tremors, weakness and light-headedness.  Hematological: Negative for adenopathy. Does not bruise/bleed easily.  Psychiatric/Behavioral: Negative for behavioral problems, confusion, sleep disturbance, dysphoric mood, decreased concentration and agitation.      OBJECTIVE: Temp:  [98.1 F (36.7 C)-99.7 F (37.6 C)] 98.1 F (36.7 C) (01/25 1348) Pulse Rate:  [52-83] 69 (01/25 1348) Resp:  [12-21] 15 (01/25 1348) BP: (90-143)/(46-85) 134/73 (01/25 1348) SpO2:  [95 %-100 %] 100 % (01/25 1348) Weight:  [86.2 kg] 86.2 kg (01/24 1550) Physical Exam  Constitutional: He is oriented to person, place, and time. He appears well-developed and well-nourished. No distress.  HENT: abrasion to bridge of nose Mouth/Throat: Oropharynx is clear and moist. No oropharyngeal exudate.  Cardiovascular: Normal rate, regular rhythm and normal heart sounds. Exam reveals no gallop and no friction rub.  No murmur heard.  Pulmonary/Chest: Effort normal and breath sounds normal. No respiratory distress. He has no wheezes.  Abdominal: Soft. Bowel sounds are normal. He exhibits no distension. There is no tenderness.  Lymphadenopathy:  He has no cervical adenopathy.  Neurological: He is alert and oriented to person, place, and time.  Skin: Skin is warm and dry. No rash noted. No erythema.  Psychiatric: He has a normal mood and affect. His behavior is normal.    LABS: Results for orders placed or performed during the hospital encounter of 12/05/22 (from the past 48 hour(s))  CBG monitoring, ED     Status: Abnormal   Collection Time: 12/05/22  3:49 PM  Result Value Ref Range   Glucose-Capillary 181 (H) 70 - 99 mg/dL    Comment: Glucose reference range applies only to samples taken after fasting for at least 8 hours.  Comprehensive metabolic panel     Status: Abnormal   Collection Time: 12/05/22  4:48 PM  Result Value Ref Range   Sodium 137 135 - 145 mmol/L   Potassium 3.8 3.5 - 5.1 mmol/L   Chloride 107 98 - 111 mmol/L   CO2 23 22 - 32 mmol/L   Glucose, Bld 194 (H) 70 - 99 mg/dL    Comment: Glucose reference range applies only to samples taken after fasting for at least 8 hours.   BUN 29 (H) 8 - 23 mg/dL   Creatinine, Ser 1.52 (H) 0.61 - 1.24 mg/dL   Calcium  8.4 (L) 8.9 - 10.3 mg/dL   Total Protein 7.3 6.5 - 8.1 g/dL   Albumin 3.2 (L) 3.5 - 5.0 g/dL   AST 25 15 - 41 U/L   ALT 21 0 - 44 U/L   Alkaline Phosphatase 40 38 - 126 U/L   Total Bilirubin 0.6 0.3 - 1.2 mg/dL   GFR, Estimated 45 (L) >60 mL/min    Comment: (NOTE) Calculated using the CKD-EPI Creatinine Equation (2021)    Anion gap 7 5 - 15    Comment: Performed at [facility], [address] [place], [place] [address]  Troponin I (High Sensitivity)     Status: None   Collection Time: 12/05/22  4:48 PM  Result Value Ref Range   Troponin I (High Sensitivity) 11 <18 ng/L    Comment: (NOTE) Elevated high sensitivity troponin I (hsTnI) values and significant  changes across  serial measurements may suggest ACS but many other  chronic and acute conditions are known to elevate hsTnI results.  Refer to the "Links" section for chest pain algorithms and additional  guidance. Performed at [facility], [address] [place], [place] [address]   CBC with Differential     Status: Abnormal   Collection Time: 12/05/22  4:48 PM  Result Value Ref Range   WBC 18.2 (H) 4.0 - 10.5 K/uL   RBC 3.71 (L) 4.22 - 5.81 MIL/uL   Hemoglobin 10.4 (L) 13.0 - 17.0 g/dL   HCT 32.2 (L) 39.0 - 52.0 %   MCV 86.8 80.0 - 100.0 fL   MCH 28.0 26.0 - 34.0 pg   MCHC 32.3 30.0 - 36.0 g/dL   RDW 14.2 11.5 - 15.5 %   Platelets 171 150 - 400 K/uL   nRBC 0.0 0.0 - 0.2 %   Neutrophils Relative % 81 %   Neutro Abs 14.8 (H) 1.7 - 7.7 K/uL   Lymphocytes Relative 7 %   Lymphs Abs 1.2 0.7 - 4.0 K/uL   Monocytes Relative 12 %   Monocytes Absolute 2.1 (H) 0.1 - 1.0 K/uL   Eosinophils Relative 0 %   Eosinophils Absolute 0.0 0.0 - 0.5 K/uL   Basophils Relative 0 %   Basophils Absolute 0.0 0.0 - 0.1 K/uL   Immature Granulocytes 0 %   Abs Immature Granulocytes 0.07 0.00 - 0.07 K/uL    Comment: Performed at [facility], [address] [place], [place] [address]  Culture, blood (routine x  2)     Status: None (Preliminary result)   Collection Time: 12/05/22  4:49 PM   Specimen: BLOOD  Result Value Ref Range   Specimen Description BLOOD LEFT ANTECUBITAL    Special Requests      BOTTLES DRAWN AEROBIC AND ANAEROBIC Blood Culture results may not be optimal due to an excessive volume of blood received in culture bottles   Culture  Setup Time      Organism ID to follow AEROBIC BOTTLE ONLY GRAM NEGATIVE RODS CRITICAL RESULT CALLED TO, READ BACK BY AND VERIFIED WITH: KRISTEN MERRILL 12/06/22 [place] Performed at [facility], [address] [place], [place] [address]    Culture GRAM NEGATIVE RODS    Report Status PENDING   Lactic acid, plasma     Status: None   Collection Time: 12/05/22  4:49 PM  Result Value Ref Range   Lactic Acid, Venous 1.4 0.5 - 1.9 mmol/L    Comment: Performed at [facility], [address] [place], [place] [address]  Blood Culture ID Panel (Reflexed)     Status: Abnormal   Collection Time: 12/05/22  4:49 PM  Result Value Ref Range   Enterococcus faecalis NOT DETECTED NOT DETECTED   Enterococcus Faecium NOT DETECTED NOT DETECTED   Listeria monocytogenes NOT DETECTED NOT DETECTED   Staphylococcus species NOT DETECTED NOT DETECTED   Staphylococcus aureus (BCID) NOT DETECTED NOT DETECTED   Staphylococcus epidermidis NOT DETECTED NOT DETECTED   Staphylococcus lugdunensis NOT DETECTED NOT DETECTED   Streptococcus species NOT DETECTED NOT DETECTED   Streptococcus agalactiae NOT DETECTED NOT DETECTED   Streptococcus pneumoniae NOT DETECTED NOT DETECTED   Streptococcus pyogenes NOT DETECTED NOT DETECTED   A.calcoaceticus-baumannii NOT DETECTED NOT DETECTED   Bacteroides fragilis NOT DETECTED NOT DETECTED   Enterobacterales DETECTED (A) NOT DETECTED    Comment: Enterobacterales represent a large order of gram negative bacteria, not a single organism. CRITICAL  RESULT CALLED TO, READ BACK BY AND VERIFIED WITH: KRISTEN MERRILL 12/06/22  0921 KLW    Enterobacter cloacae complex DETECTED (A) NOT DETECTED    Comment: CRITICAL RESULT CALLED TO, READ BACK BY AND VERIFIED WITH: KRISTEN MERRILL 12/06/22 0921 KLW    Escherichia coli NOT DETECTED NOT DETECTED   Klebsiella aerogenes NOT DETECTED NOT DETECTED   Klebsiella oxytoca NOT DETECTED NOT DETECTED   Klebsiella pneumoniae NOT DETECTED NOT DETECTED   Proteus species NOT DETECTED NOT DETECTED   Salmonella species NOT DETECTED NOT DETECTED   Serratia marcescens NOT DETECTED NOT DETECTED   Haemophilus influenzae NOT DETECTED NOT DETECTED   Neisseria meningitidis NOT DETECTED NOT DETECTED   Pseudomonas aeruginosa NOT DETECTED NOT DETECTED   Stenotrophomonas maltophilia NOT DETECTED NOT DETECTED   Candida albicans NOT DETECTED NOT DETECTED   Candida auris NOT DETECTED NOT DETECTED   Candida glabrata NOT DETECTED NOT DETECTED   Candida krusei NOT DETECTED NOT DETECTED   Candida parapsilosis NOT DETECTED NOT DETECTED   Candida tropicalis NOT DETECTED NOT DETECTED   Cryptococcus neoformans/gattii NOT DETECTED NOT DETECTED   CTX-M ESBL NOT DETECTED NOT DETECTED   Carbapenem resistance IMP NOT DETECTED NOT DETECTED   Carbapenem resistance KPC NOT DETECTED NOT DETECTED   Carbapenem resistance NDM NOT DETECTED NOT DETECTED   Carbapenem resist OXA 48 LIKE NOT DETECTED NOT DETECTED   Carbapenem resistance VIM NOT DETECTED NOT DETECTED    Comment: Performed at [facility], [address] [place], [place] [address]  Culture, blood (routine x 2)     Status: None (Preliminary result)   Collection Time: 12/05/22  4:54 PM   Specimen: BLOOD  Result Value Ref Range   Specimen Description BLOOD BLOOD LEFT HAND    Special Requests      BOTTLES DRAWN AEROBIC AND ANAEROBIC Blood Culture results may not be optimal due to an excessive volume of blood received in culture bottles   Culture  Setup Time NO ORGANISMS SEEN AEROBIC BOTTLE ONLY     Culture      NO ORGANISMS  SEEN Performed at [facility], [address] [place], [place] [address]    Report Status PENDING   Urinalysis, Routine w reflex microscopic -Urine, Catheterized; In and out catheter     Status: Abnormal   Collection Time: 12/05/22  7:23 PM  Result Value Ref Range   Color, Urine YELLOW (A) YELLOW   APPearance TURBID (A) CLEAR   Specific Gravity, Urine 1.011 1.005 - 1.030   pH 5.0 5.0 - 8.0   Glucose, UA NEGATIVE NEGATIVE mg/dL   Hgb urine dipstick SMALL (A) NEGATIVE   Bilirubin Urine NEGATIVE NEGATIVE   Ketones, ur NEGATIVE NEGATIVE mg/dL   Protein, ur 30 (A) NEGATIVE mg/dL   Nitrite NEGATIVE NEGATIVE   Leukocytes,Ua LARGE (A) NEGATIVE   RBC / HPF 11-20 0 - 5 RBC/hpf   WBC, UA >50 (H) 0 - 5 WBC/hpf   Bacteria, UA FEW (A) NONE SEEN   Squamous Epithelial / HPF NONE SEEN 0 - 5 /HPF   WBC Clumps PRESENT     Comment: Performed at [facility], [address] [place], [place] [address]  Protime-INR     Status: Abnormal   Collection Time: 12/06/22  5:23 AM  Result Value Ref Range   Prothrombin Time 15.5 (H) 11.4 - 15.2 seconds   INR 1.3 (H) 0.8 - 1.2    Comment: (NOTE) INR goal varies based on device and disease states. Performed  at [facility], [address] [place], [place] [address]   Cortisol-am, blood     Status: None   Collection Time: 12/06/22  5:23 AM  Result Value Ref Range   Cortisol - AM 15.4 6.7 - 22.6 ug/dL    Comment: Performed at [facility] [address] [place], [place] [address]  Procalcitonin     Status: None   Collection Time: 12/06/22  5:23 AM  Result Value Ref Range   Procalcitonin 0.35 ng/mL    Comment:        Interpretation: PCT (Procalcitonin) <= 0.5 ng/mL: Systemic infection (sepsis) is not likely. Local bacterial infection is possible. (NOTE)       Sepsis PCT Algorithm           Lower Respiratory Tract                                      Infection PCT Algorithm    ----------------------------      ----------------------------         PCT < 0.25 ng/mL                PCT < 0.10 ng/mL          Strongly encourage             Strongly discourage   discontinuation of antibiotics    initiation of antibiotics    ----------------------------     -----------------------------       PCT 0.25 - 0.50 ng/mL            PCT 0.10 - 0.25 ng/mL               OR       >80% decrease in PCT            Discourage initiation of                                            antibiotics      Encourage discontinuation           of antibiotics    ----------------------------     -----------------------------         PCT >= 0.50 ng/mL              PCT 0.26 - 0.50 ng/mL               AND        <80% decrease in PCT             Encourage initiation of                                             antibiotics       Encourage continuation           of antibiotics    ----------------------------     -----------------------------        PCT >= 0.50 ng/mL                  PCT > 0.50 ng/mL               AND  increase in PCT                  Strongly encourage                                      initiation of antibiotics    Strongly encourage escalation           of antibiotics                                     -----------------------------                                           PCT <= 0.25 ng/mL                                                 OR                                        > 80% decrease in PCT                                      Discontinue / Do not initiate                                             antibiotics  Performed at [facility], [address] [place], [place] [address]   Basic metabolic panel     Status: Abnormal   Collection Time: 12/06/22  5:23 AM  Result Value Ref Range   Sodium 141 135 - 145 mmol/L   Potassium 3.4 (L) 3.5 - 5.1 mmol/L   Chloride 114 (H) 98 - 111 mmol/L   CO2 23 22 - 32 mmol/L   Glucose, Bld 139 (H) 70 - 99 mg/dL    Comment: Glucose  reference range applies only to samples taken after fasting for at least 8 hours.   BUN 23 8 - 23 mg/dL   Creatinine, Ser 1.29 (H) 0.61 - 1.24 mg/dL   Calcium 7.9 (L) 8.9 - 10.3 mg/dL   GFR, Estimated 55 (L) >60 mL/min    Comment: (NOTE) Calculated using the CKD-EPI Creatinine Equation (2021)    Anion gap 4 (L) 5 - 15    Comment: Performed at [facility], [address] [place], [place] [address]  CBC     Status: Abnormal   Collection Time: 12/06/22  5:23 AM  Result Value Ref Range   WBC 14.0 (H) 4.0 - 10.5 K/uL   RBC 3.38 (L) 4.22 - 5.81 MIL/uL   Hemoglobin 9.5 (L) 13.0 - 17.0 g/dL   HCT 29.6 (L) 39.0 - 52.0 %   MCV 87.6 80.0 - 100.0 fL   MCH 28.1 26.0 - 34.0 pg   MCHC 32.1 30.0 - 36.0 g/dL  RDW 14.2 11.5 - 15.5 %   Platelets 178 150 - 400 K/uL   nRBC 0.0 0.0 - 0.2 %    Comment: Performed at [facility], [address] [place], [place] [address]  CBG monitoring, ED     Status: Abnormal   Collection Time: 12/06/22  7:46 AM  Result Value Ref Range   Glucose-Capillary 147 (H) 70 - 99 mg/dL    Comment: Glucose reference range applies only to samples taken after fasting for at least 8 hours.  CBG monitoring, ED     Status: Abnormal   Collection Time: 12/06/22 12:05 PM  Result Value Ref Range   Glucose-Capillary 124 (H) 70 - 99 mg/dL    Comment: Glucose reference range applies only to samples taken after fasting for at least 8 hours.    MICRO: 1/24 blood cx +GNR IMAGING: US RENAL  Result Date: 12/06/2022 CLINICAL DATA:  Abdominal pain and chronic kidney disease. Diabetes. EXAM: RENAL / URINARY TRACT ULTRASOUND COMPLETE COMPARISON:  06/12/2022 FINDINGS: Right Kidney: Renal measurements: 12.9 by 6.3 by 5.7 cm = volume: 243 mL. Echogenic right renal parenchyma with moderate to prominent hydronephrosis. 4.3 by 3.9 by 3.0 cm left kidney lower pole cyst with a single thin septation. Left Kidney: Renal measurements: 14.3 by 6.5 by 6.2 cm = volume: 303 mL.  Echogenic left kidney with moderate to prominent hydronephrosis. 7.0 by 7.0 by 9.3 cm exophytic cyst from the lower pole appears simple where visualized. Bladder: Bilateral ureteral jets are visible. Layering debris in the urinary bladder. Prevoid bladder volume 235 cc, no change on postvoid imaging. Mild urinary bladder wall thickening given the degree of distension, bladder wall thickness of 0.4 cm. Other: None. IMPRESSION: 1. Moderate to prominent bilateral hydronephrosis, similar to prior. Echogenic kidneys compatible with medical renal disease. 2. Layering debris in the urinary bladder (which can correlate with urinary tract infection, blood products can cause a similar appearance). 3. Mild urinary bladder wall thickening. The patient was apparently unable to substantially void. 4. Bilateral renal cysts with benign imaging characteristics. Electronically Signed   By: Van Clines M.D.   On: 12/06/2022 10:38   CT Head Wo Contrast  Result Date: 12/05/2022 CLINICAL DATA:  Head trauma, minor (Age >= 65y). Dizziness with multiple unwitnessed falls. EXAM: CT HEAD WITHOUT CONTRAST TECHNIQUE: Contiguous axial images were obtained from the base of the skull through the vertex without intravenous contrast. RADIATION DOSE REDUCTION: This exam was performed according to the departmental dose-optimization program which includes automated exposure control, adjustment of the mA and/or kV according to patient size and/or use of iterative reconstruction technique. COMPARISON:  Head CT and MRI 08/06/2020 FINDINGS: Brain: There is no evidence of an acute infarct, intracranial hemorrhage, midline shift, or extra-axial fluid collection. A large right middle cranial fossa arachnoid cyst is unchanged. There is mild cerebral atrophy. There is unchanged diffuse dural calcification over the right cerebral convexity. Vascular: Calcified atherosclerosis at the skull base. No hyperdense vessel. Skull: No acute fracture or  suspicious osseous lesion. Sinuses/Orbits: Visualized paranasal sinuses and mastoid air cells are clear. Bilateral cataract extraction. Other: None. IMPRESSION: No evidence of acute intracranial abnormality. Electronically Signed   By: Logan Bores M.D.   On: 12/05/2022 17:53   DG Chest 2 View  Result Date: 12/05/2022 CLINICAL DATA:  Multiple falls, dizziness EXAM: CHEST - 2 VIEW COMPARISON:  08/06/2020 FINDINGS: Normal heart size, mediastinal contours, and pulmonary vascularity. Calcified granulomata LEFT lung. Increased RIGHT basilar atelectasis. Remaining lungs clear. No acute  infiltrate, pleural effusion, or pneumothorax. No osseous abnormalities. IMPRESSION: Old granulomatous disease and increased RIGHT basilar atelectasis. Electronically Signed   By: Lavonia Dana M.D.   On: 12/05/2022 17:15   Renal ultrasound: 1. Moderate to prominent bilateral hydronephrosis, similar to prior. Echogenic kidneys compatible with medical renal disease. 2. Layering debris in the urinary bladder (which can correlate with urinary tract infection, blood products can cause a similar appearance). 3. Mild urinary bladder wall thickening. The patient was apparently unable to substantially void. 4. Bilateral renal cysts with benign imaging characteristics.  Assessment/Plan:   Change ceftraxone to cefepime until further identification/sensitivities for enterobacter bacteremia associated with urine tract infection/cystitis  For the time being, continue to monitor to see if needs indwelling catheter, for now appears to be fine with purewick external catheter.  Imaging unchanged largely from 8/23.

## 2022-12-06 NOTE — Assessment & Plan Note (Signed)
-  This is AKI on CKD stage 3A. - We will continue with IV normal saline and follow BMP. - We will hold off nephrotoxins.

## 2022-12-06 NOTE — Progress Notes (Signed)
PHARMACY - PHYSICIAN COMMUNICATION CRITICAL VALUE ALERT - BLOOD CULTURE IDENTIFICATION (BCID)  Frank Carlson is an 82 y.o. male who presented to [facility] on 12/05/2022 with a chief complaint of fall and dizziness  Assessment:  Blood cultures from 1/24 with GNR and BCID detects E cloacae.  Urine is suspected source (patient self-catheterizes)  Name of physician (or Provider) Contacted: Dr Serita Grit  Current antibiotics: ceftriaxone  Changes to prescribed antibiotics recommended:  Recommendations accepted by provider  Results for orders placed or performed during the hospital encounter of 12/05/22  Blood Culture ID Panel (Reflexed) (Collected: 12/05/2022  4:49 PM)  Result Value Ref Range   Enterococcus faecalis NOT DETECTED NOT DETECTED   Enterococcus Faecium NOT DETECTED NOT DETECTED   Listeria monocytogenes NOT DETECTED NOT DETECTED   Staphylococcus species NOT DETECTED NOT DETECTED   Staphylococcus aureus (BCID) NOT DETECTED NOT DETECTED   Staphylococcus epidermidis NOT DETECTED NOT DETECTED   Staphylococcus lugdunensis NOT DETECTED NOT DETECTED   Streptococcus species NOT DETECTED NOT DETECTED   Streptococcus agalactiae NOT DETECTED NOT DETECTED   Streptococcus pneumoniae NOT DETECTED NOT DETECTED   Streptococcus pyogenes NOT DETECTED NOT DETECTED   A.calcoaceticus-baumannii NOT DETECTED NOT DETECTED   Bacteroides fragilis NOT DETECTED NOT DETECTED   Enterobacterales DETECTED (A) NOT DETECTED   Enterobacter cloacae complex DETECTED (A) NOT DETECTED   Escherichia coli NOT DETECTED NOT DETECTED   Klebsiella aerogenes NOT DETECTED NOT DETECTED   Klebsiella oxytoca NOT DETECTED NOT DETECTED   Klebsiella pneumoniae NOT DETECTED NOT DETECTED   Proteus species NOT DETECTED NOT DETECTED   Salmonella species NOT DETECTED NOT DETECTED   Serratia marcescens NOT DETECTED NOT DETECTED   Haemophilus influenzae NOT DETECTED NOT DETECTED   Neisseria meningitidis NOT DETECTED NOT DETECTED    Pseudomonas aeruginosa NOT DETECTED NOT DETECTED   Stenotrophomonas maltophilia NOT DETECTED NOT DETECTED   Candida albicans NOT DETECTED NOT DETECTED   Candida auris NOT DETECTED NOT DETECTED   Candida glabrata NOT DETECTED NOT DETECTED   Candida krusei NOT DETECTED NOT DETECTED   Candida parapsilosis NOT DETECTED NOT DETECTED   Candida tropicalis NOT DETECTED NOT DETECTED   Cryptococcus neoformans/gattii NOT DETECTED NOT DETECTED   CTX-M ESBL NOT DETECTED NOT DETECTED   Carbapenem resistance IMP NOT DETECTED NOT DETECTED   Carbapenem resistance KPC NOT DETECTED NOT DETECTED   Carbapenem resistance NDM NOT DETECTED NOT DETECTED   Carbapenem resist OXA 48 LIKE NOT DETECTED NOT DETECTED   Carbapenem resistance VIM NOT DETECTED NOT DETECTED   Doreene Eland, PharmD, BCPS, BCIDP Work Cell: [phone] 12/06/2022 9:29 AM

## 2022-12-06 NOTE — Assessment & Plan Note (Signed)
-  The patient will be placed on supplement coverage with NovoLog. - We will hold off metformin. 

## 2022-12-06 NOTE — Assessment & Plan Note (Signed)
-  We will continue statin therapy and Lovaza. 

## 2022-12-06 NOTE — Assessment & Plan Note (Addendum)
-  This is clearly due to  volume depletion and dehydration. - It is likely the culprit for his fall and possibly recurrent falls. - Will follow orthostatics with hydration.

## 2022-12-06 NOTE — Assessment & Plan Note (Signed)
-  This is manifested by leukocytosis and brief tachypnea of 21. -The patient will be admitted to a medical telemetry bed. - We will continue antibiotic therapy with IV Rocephin. - We will continue hydration with IV normal saline. - We will follow blood and urine cultures.

## 2022-12-06 NOTE — Assessment & Plan Note (Signed)
-  We will continue Flomax 

## 2022-12-06 NOTE — Progress Notes (Signed)
Pharmacy Antibiotic Note  Frank Carlson is a 82 y.o. male admitted on 12/05/2022 with bacteremia.  Pharmacy has been consulted for cefepime dosing. He notes urinary frequency and dysuria.  Patient self catheterizes as home.   Today, 12/06/2022 Day 1 cefepime Renal: Mild AKI - improving WBC = 18 to 14 Afebrile Blood culture with E cloacae  Plan: For Enterobacter in blood cx, change ceftriaxone to cefepime 2gm IV q12h based on current CrCl Monitor renal function, culture/susceptibilities, clinical improvement  Height: '5\' 11"'$  (180.3 cm) Weight: 86.2 kg (190 lb 0.6 oz) IBW/kg (Calculated) : 75.3  Temp (24hrs), Avg:99.2 F (37.3 C), Min:98.3 F (36.8 C), Max:99.7 F (37.6 C)  Recent Labs  Lab 12/05/22 1648 12/05/22 1649 12/06/22 0523  WBC 18.2*  --  14.0*  CREATININE 1.52*  --  1.29*  LATICACIDVEN  --  1.4  --     Estimated Creatinine Clearance: 46.2 mL/min (A) (by C-G formula based on SCr of 1.29 mg/dL (H)).    Allergies  Allergen Reactions   Bee Venom Other (See Comments)    Passed out    Antimicrobials this admission: Ceftriaxone 1/24 >>1/25 Cefepime 1/25 >>  Dose adjustments this admission:  Microbiology results: 1/24 BCx: GNR, BCID E. Cloacae 1/24 UCx:     Thank you for allowing pharmacy to be a part of this patient's care.  Doreene Eland, PharmD, BCPS, BCIDP Work Cell: [phone] 12/06/2022 10:07 AM

## 2022-12-06 NOTE — Progress Notes (Signed)
[facility] at [place] NAME: Frank Carlson    MR#:  [id number]  DATE OF BIRTH:  11/07/1941  SUBJECTIVE:  no family at bedside. Patient came in after fallen dizziness. Denies any frequent falls. Lives at home with wife. No family at bedside. Wondering when he can go home. Overall feels better. Discussed that he has positive blood culture and likely infection urine.    VITALS:  Blood pressure 134/73, pulse 69, temperature 98.1 F (36.7 C), temperature source Oral, resp. rate 15, height '5\' 11"'$  (1.803 m), weight 86.2 kg, SpO2 100 %.  PHYSICAL EXAMINATION:   GENERAL:  82 y.o.-year-old patient lying in the bed with no acute distress.  LUNGS: Normal breath sounds bilaterally, no wheezing CARDIOVASCULAR: S1, S2 normal. No murmur   ABDOMEN: Soft, nontender, nondistended. Bowel sounds present.  EXTREMITIES: No  edema b/l.    NEUROLOGIC: nonfocal  patient is alert and awake SKIN: No obvious rash, lesion, or ulcer.   LABORATORY PANEL:  CBC Recent Labs  Lab 12/06/22 0523  WBC 14.0*  HGB 9.5*  HCT 29.6*  PLT 178    Chemistries  Recent Labs  Lab 12/05/22 1648 12/06/22 0523  NA 137 141  K 3.8 3.4*  CL 107 114*  CO2 23 23  GLUCOSE 194* 139*  BUN 29* 23  CREATININE 1.52* 1.29*  CALCIUM 8.4* 7.9*  AST 25  --   ALT 21  --   ALKPHOS 40  --   BILITOT 0.6  --    Cardiac Enzymes No results for input(s): "TROPONINI" in the last 168 hours. RADIOLOGY:  US RENAL  Result Date: 12/06/2022 CLINICAL DATA:  Abdominal pain and chronic kidney disease. Diabetes. EXAM: RENAL / URINARY TRACT ULTRASOUND COMPLETE COMPARISON:  06/12/2022 FINDINGS: Right Kidney: Renal measurements: 12.9 by 6.3 by 5.7 cm = volume: 243 mL. Echogenic right renal parenchyma with moderate to prominent hydronephrosis. 4.3 by 3.9 by 3.0 cm left kidney lower pole cyst with a single thin septation. Left Kidney: Renal measurements: 14.3 by 6.5 by 6.2 cm = volume: 303 mL. Echogenic  left kidney with moderate to prominent hydronephrosis. 7.0 by 7.0 by 9.3 cm exophytic cyst from the lower pole appears simple where visualized. Bladder: Bilateral ureteral jets are visible. Layering debris in the urinary bladder. Prevoid bladder volume 235 cc, no change on postvoid imaging. Mild urinary bladder wall thickening given the degree of distension, bladder wall thickness of 0.4 cm. Other: None. IMPRESSION: 1. Moderate to prominent bilateral hydronephrosis, similar to prior. Echogenic kidneys compatible with medical renal disease. 2. Layering debris in the urinary bladder (which can correlate with urinary tract infection, blood products can cause a similar appearance). 3. Mild urinary bladder wall thickening. The patient was apparently unable to substantially void. 4. Bilateral renal cysts with benign imaging characteristics. Electronically Signed   By: Van Clines M.D.   On: 12/06/2022 10:38   CT Head Wo Contrast  Result Date: 12/05/2022 CLINICAL DATA:  Head trauma, minor (Age >= 65y). Dizziness with multiple unwitnessed falls. EXAM: CT HEAD WITHOUT CONTRAST TECHNIQUE: Contiguous axial images were obtained from the base of the skull through the vertex without intravenous contrast. RADIATION DOSE REDUCTION: This exam was performed according to the departmental dose-optimization program which includes automated exposure control, adjustment of the mA and/or kV according to patient size and/or use of iterative reconstruction technique. COMPARISON:  Head CT and MRI 08/06/2020 FINDINGS: Brain: There is no evidence of an acute infarct, intracranial hemorrhage, midline shift,  or extra-axial fluid collection. A large right middle cranial fossa arachnoid cyst is unchanged. There is mild cerebral atrophy. There is unchanged diffuse dural calcification over the right cerebral convexity. Vascular: Calcified atherosclerosis at the skull base. No hyperdense vessel. Skull: No acute fracture or suspicious  osseous lesion. Sinuses/Orbits: Visualized paranasal sinuses and mastoid air cells are clear. Bilateral cataract extraction. Other: None. IMPRESSION: No evidence of acute intracranial abnormality. Electronically Signed   By: Logan Bores M.D.   On: 12/05/2022 17:53   DG Chest 2 View  Result Date: 12/05/2022 CLINICAL DATA:  Multiple falls, dizziness EXAM: CHEST - 2 VIEW COMPARISON:  08/06/2020 FINDINGS: Normal heart size, mediastinal contours, and pulmonary vascularity. Calcified granulomata LEFT lung. Increased RIGHT basilar atelectasis. Remaining lungs clear. No acute infiltrate, pleural effusion, or pneumothorax. No osseous abnormalities. IMPRESSION: Old granulomatous disease and increased RIGHT basilar atelectasis. Electronically Signed   By: Lavonia Dana M.D.   On: 12/05/2022 17:15    Assessment and Plan  Frank Carlson is a 82 y.o. Caucasian male with medical history significant for type 2 diabetes mellitus, dyslipidemia and BPH, who presented to the emergency room with acute onset of dizziness and fall when he was trying to get up and stand.  He self catheterizes and noticed urine has been cloudy.    Sepsis due to gram-negative UTI (HCC) Enterococcus Cloacae H/o BPH--self catheterization - came in with leukocytosis and tachypnea of 21. --  IV Rocephin. - Consultation with Dr. Graylon Good -- patient received IV fluids --US renal shows bilateral moderate hydronephrosis, bladder debris/wall thickening, renal cyst (similar to prior) --pt has h/o BPH and follows urology in [place]--per pt was told to self cath 4 times a day" --cont flomax  Orthostatic hypotension -  clearly due to  volume depletion and dehydration. - It is likely the culprit for his fall and possibly recurrent falls. --improved BP --d/c ivf   Acute kidney injury superimposed on chronic kidney disease ([place]) 3a - AKI on CKD stage 3A. - creat down to 1.2 with IVF   Dyslipidemia -  continue statin therapy and Lovaza.   BPH  with obstruction/lower urinary tract symptoms - as above   Type 2 diabetes mellitus with chronic kidney disease ([place]) - ssi for now - We will hold off metformin specially given AKI.  Fall with weakness --PT to see    Procedures: Family communication :none today Consults :ID CODE STATUS: Full DVT Prophylaxis :lovenox Level of care: Telemetry Medical Status is: Inpatient Remains inpatient appropriate because: sepsis    TOTAL TIME TAKING CARE OF THIS PATIENT: 35 minutes.  >50% time spent on counselling and coordination of care  Note: This dictation was prepared with Dragon dictation along with smaller phrase technology. Any transcriptional errors that result from this process are unintentional.  Fritzi Mandes M.D    [facility]   CC: Primary care physician; Venia Carbon, MD

## 2022-12-06 NOTE — Assessment & Plan Note (Signed)
-  The patient will be placed on supplemental coverage with NovoLog. - We will hold off metformin specially given AKI.

## 2022-12-07 DIAGNOSIS — G9341 Metabolic encephalopathy: Secondary | ICD-10-CM | POA: Diagnosis not present

## 2022-12-07 DIAGNOSIS — E785 Hyperlipidemia, unspecified: Secondary | ICD-10-CM | POA: Diagnosis not present

## 2022-12-07 DIAGNOSIS — A4150 Gram-negative sepsis, unspecified: Secondary | ICD-10-CM | POA: Diagnosis not present

## 2022-12-07 DIAGNOSIS — N179 Acute kidney failure, unspecified: Secondary | ICD-10-CM | POA: Diagnosis not present

## 2022-12-07 DIAGNOSIS — N390 Urinary tract infection, site not specified: Secondary | ICD-10-CM | POA: Diagnosis not present

## 2022-12-07 LAB — CREATININE, SERUM
Creatinine, Ser: 1.34 mg/dL — ABNORMAL HIGH (ref 0.61–1.24)
GFR, Estimated: 53 mL/min — ABNORMAL LOW (ref >60)

## 2022-12-07 LAB — GLUCOSE, CAPILLARY
Glucose-Capillary: 159 mg/dL — ABNORMAL HIGH (ref 70–99)
Glucose-Capillary: 109 mg/dL — ABNORMAL HIGH (ref 70–99)
Glucose-Capillary: 101 mg/dL — ABNORMAL HIGH (ref 70–99)
Glucose-Capillary: 180 mg/dL — ABNORMAL HIGH (ref 70–99)

## 2022-12-07 MED ORDER — LORAZEPAM 2 MG/ML IJ SOLN: 2.0000 mg | Freq: Once | INTRAMUSCULAR | Status: DC

## 2022-12-07 MED ORDER — LORAZEPAM 2 MG/ML IJ SOLN
2.0000 mg | Freq: Once | INTRAMUSCULAR | Status: AC
  Administered 2022-12-07: 2 mg via INTRAMUSCULAR

## 2022-12-07 MED ORDER — DIPHENHYDRAMINE HCL 50 MG/ML IJ SOLN
25.0000 mg | Freq: Once | INTRAMUSCULAR | Status: AC
  Administered 2022-12-07: 25 mg via INTRAVENOUS
  Filled 2022-12-07: qty 1

## 2022-12-07 MED ORDER — LORAZEPAM 2 MG/ML IJ SOLN
INTRAMUSCULAR | Status: AC
  Filled 2022-12-07: qty 1

## 2022-12-07 MED ORDER — LORAZEPAM 2 MG PO TABS
2.0000 mg | ORAL_TABLET | Freq: Once | ORAL | Status: AC
  Administered 2022-12-07: 2 mg via ORAL
  Filled 2022-12-07: qty 1

## 2022-12-07 MED ORDER — HALOPERIDOL LACTATE 5 MG/ML IJ SOLN
2.5000 mg | INTRAMUSCULAR | Status: AC
  Administered 2022-12-07: 2.5 mg via INTRAVENOUS
  Filled 2022-12-07: qty 1

## 2022-12-07 NOTE — Significant Event (Signed)
Rapid Response Event Note   Reason for Call : called for RR with pt experiencing bouts of violence.    Initial Focused Assessment: laying in bed, 4 security plus nurses, and techs at bedside. Previous shift reports pt sundowning over night, not sleeping much.      Interventions: Dr Posey Pronto to bedside, haldol was previously given. Ordered ativan IM, and 1:1 sitter, until further notice.   Plan of Care: as above    Event Summary: as above  MD Notified: Patel 0740 Call [place] A, RN

## 2022-12-07 NOTE — Progress Notes (Signed)
Pharmacy Antibiotic Note  Frank Carlson is a 82 y.o. male admitted on 12/05/2022 with bacteremia.  Pharmacy has been consulted for cefepime dosing. He notes urinary frequency and dysuria.  Patient self catheterizes as home.   Today, 12/07/2022 Day 2 cefepime Renal: ? Mild AKI - SCr 1.34 (slightly up from previous) WBC = 18 to 14 - no CBC 1/26 Afebrile Blood culture with E cloacae - susc pending Renal US 1/25 with BL hydronephrosis - unchanged from Aug (has PMH BPH) ID consult  Plan: For Enterobacter in blood cx, continue cefepime 2gm IV q12h based on current CrCl Monitor renal function, culture/susceptibilities, clinical improvement  Height: '5\' 11"'$  (180.3 cm) Weight: 86.2 kg (190 lb 0.6 oz) IBW/kg (Calculated) : 75.3  Temp (24hrs), Avg:98 F (36.7 C), Min:97.5 F (36.4 C), Max:98.3 F (36.8 C)  Recent Labs  Lab 12/05/22 1648 12/05/22 1649 12/06/22 0523 12/07/22 0448  WBC 18.2*  --  14.0*  --   CREATININE 1.52*  --  1.29* 1.34*  LATICACIDVEN  --  1.4  --   --      Estimated Creatinine Clearance: 44.5 mL/min (A) (by C-G formula based on SCr of 1.34 mg/dL (H)).    Allergies  Allergen Reactions   Bee Venom Other (See Comments)    Passed out    Antimicrobials this admission: Ceftriaxone 1/24 >>1/25 Cefepime 1/25 >>  Dose adjustments this admission:  Microbiology results: 1/24 BCx: GNR, BCID E. Cloacae 1/24 UCx:     Thank you for allowing pharmacy to be a part of this patient's care.  Doreene Eland, PharmD, BCPS, BCIDP Work Cell: [phone] 12/07/2022 8:34 AM

## 2022-12-07 NOTE — Care Management Important Message (Signed)
Important Message  Patient Details  Name: Frank Carlson MRN: [id number] Date of Birth: 1941/08/02   Medicare Important Message Given:  Yes  Patient asleep upon time of visit, no family available.  Copy of Medicare IM left in room for reference.    Dannette Barbara 12/07/2022, 10:36 AM

## 2022-12-07 NOTE — Progress Notes (Signed)
[facility] at [place] NAME: Frank Carlson    MR#:  [id number]  DATE OF BIRTH:  September 17, 1941  SUBJECTIVE:  no family at bedside. Patient came in after fallen dizziness.   Rapid response call this morning secondary to patient becoming very agitated. Pulled IV, trying to get out of bed and wants to go home. Not able to redirect him. No fever. Per RN patient slept about 50% of the time last night.  UOP 1700 cc thru purwick  VITALS:  Blood pressure 122/62, pulse (!) 55, temperature (!) 97.5 F (36.4 C), resp. rate 16, height '5\' 11"'$  (1.803 m), weight 86.2 kg, SpO2 99 %.  PHYSICAL EXAMINATION:   GENERAL:  82 y.o.-year-old patient lying in the bed with no acute distress.  LUNGS: Normal breath sounds bilaterally, no wheezing CARDIOVASCULAR: S1, S2 normal. No murmur   ABDOMEN: Soft, nontender, nondistended. Bowel sounds present.  EXTREMITIES: No  edema b/l.    NEUROLOGIC: pt is agitated SKIN: per Rn  LABORATORY PANEL:  CBC Recent Labs  Lab 12/06/22 0523  WBC 14.0*  HGB 9.5*  HCT 29.6*  PLT 178     Chemistries  Recent Labs  Lab 12/05/22 1648 12/06/22 0523 12/07/22 0448  NA 137 141  --   K 3.8 3.4*  --   CL 107 114*  --   CO2 23 23  --   GLUCOSE 194* 139*  --   BUN 29* 23  --   CREATININE 1.52* 1.29* 1.34*  CALCIUM 8.4* 7.9*  --   AST 25  --   --   ALT 21  --   --   ALKPHOS 40  --   --   BILITOT 0.6  --   --     Cardiac Enzymes No results for input(s): "TROPONINI" in the last 168 hours. RADIOLOGY:  US RENAL  Result Date: 12/06/2022 CLINICAL DATA:  Abdominal pain and chronic kidney disease. Diabetes. EXAM: RENAL / URINARY TRACT ULTRASOUND COMPLETE COMPARISON:  06/12/2022 FINDINGS: Right Kidney: Renal measurements: 12.9 by 6.3 by 5.7 cm = volume: 243 mL. Echogenic right renal parenchyma with moderate to prominent hydronephrosis. 4.3 by 3.9 by 3.0 cm left kidney lower pole cyst with a single thin septation. Left Kidney: Renal  measurements: 14.3 by 6.5 by 6.2 cm = volume: 303 mL. Echogenic left kidney with moderate to prominent hydronephrosis. 7.0 by 7.0 by 9.3 cm exophytic cyst from the lower pole appears simple where visualized. Bladder: Bilateral ureteral jets are visible. Layering debris in the urinary bladder. Prevoid bladder volume 235 cc, no change on postvoid imaging. Mild urinary bladder wall thickening given the degree of distension, bladder wall thickness of 0.4 cm. Other: None. IMPRESSION: 1. Moderate to prominent bilateral hydronephrosis, similar to prior. Echogenic kidneys compatible with medical renal disease. 2. Layering debris in the urinary bladder (which can correlate with urinary tract infection, blood products can cause a similar appearance). 3. Mild urinary bladder wall thickening. The patient was apparently unable to substantially void. 4. Bilateral renal cysts with benign imaging characteristics. Electronically Signed   By: Frank Carlson M.D.   On: 12/06/2022 10:38   CT Head Wo Contrast  Result Date: 12/05/2022 CLINICAL DATA:  Head trauma, minor (Age >= 65y). Dizziness with multiple unwitnessed falls. EXAM: CT HEAD WITHOUT CONTRAST TECHNIQUE: Contiguous axial images were obtained from the base of the skull through the vertex without intravenous contrast. RADIATION DOSE REDUCTION: This exam was performed according to the departmental dose-optimization  program which includes automated exposure control, adjustment of the mA and/or kV according to patient size and/or use of iterative reconstruction technique. COMPARISON:  Head CT and MRI 08/06/2020 FINDINGS: Brain: There is no evidence of an acute infarct, intracranial hemorrhage, midline shift, or extra-axial fluid collection. A large right middle cranial fossa arachnoid cyst is unchanged. There is mild cerebral atrophy. There is unchanged diffuse dural calcification over the right cerebral convexity. Vascular: Calcified atherosclerosis at the skull base. No  hyperdense vessel. Skull: No acute fracture or suspicious osseous lesion. Sinuses/Orbits: Visualized paranasal sinuses and mastoid air cells are clear. Bilateral cataract extraction. Other: None. IMPRESSION: No evidence of acute intracranial abnormality. Electronically Signed   By: Frank Carlson M.D.   On: 12/05/2022 17:53   DG Chest 2 View  Result Date: 12/05/2022 CLINICAL DATA:  Multiple falls, dizziness EXAM: CHEST - 2 VIEW COMPARISON:  08/06/2020 FINDINGS: Normal heart size, mediastinal contours, and pulmonary vascularity. Calcified granulomata LEFT lung. Increased RIGHT basilar atelectasis. Remaining lungs clear. No acute infiltrate, pleural effusion, or pneumothorax. No osseous abnormalities. IMPRESSION: Old granulomatous disease and increased RIGHT basilar atelectasis. Electronically Signed   By: Frank Carlson M.D.   On: 12/05/2022 17:15    Assessment and Plan  Frank Carlson is a 82 y.o. Caucasian male with medical history significant for type 2 diabetes mellitus, dyslipidemia and BPH, who presented to the emergency room with acute onset of dizziness and fall when he was trying to get up and stand.  He self catheterizes and noticed urine has been cloudy.    Sepsis due to gram-negative UTI (Frank Carlson) Enterococcus Cloacae H/o BPH--self catheterization - came in with leukocytosis and tachypnea of 21. --  IV Rocephin. - Consultation with Frank Carlson -- patient received IV fluids --US renal shows bilateral moderate hydronephrosis, bladder debris/wall thickening, renal cyst (similar to prior) --pt has h/o BPH and follows urology in [place]--per pt was told to self cath 4 times a day" --cont flomax  Acute metabolic encephalopathy secondary to sepsis with delirium -- patient very agitated, pulled out IV, rapid response called. Received Ativan and Haldol. Continues to remain agitated. Will give another round of Ativan and IV Benadryl. Vitals are stable so far. -- Discussed with wife Frank Carlson on the  phone. -- If continues to remain agitated might need IV precedex gtt -safety sitter for now  Orthostatic hypotension - due to  volume depletion and dehydration. - It is likely the culprit for recurrent falls. --improved BP --d/c ivf   Acute kidney injury superimposed on chronic kidney disease ([place]) 3a - AKI on CKD stage 3A. - creat down to 1.2 with IVF   Dyslipidemia -  continue statin and Lovaza.   BPH with obstruction/lower urinary tract symptoms - as above   Type 2 diabetes mellitus with chronic kidney disease ([place]) - ssi for now - We will hold off metformin given AKI.  Fall with weakness --PT to see    Procedures: Family communication : wife on the phone Consults :ID CODE STATUS: Full DVT Prophylaxis :lovenox Level of care: Telemetry Medical Status is: Inpatient Remains inpatient appropriate because: sepsis    TOTAL TIME TAKING CARE OF THIS PATIENT: 35 minutes.  >50% time spent on counselling and coordination of care  Note: This dictation was prepared with Dragon dictation along with smaller phrase technology. Any transcriptional errors that result from this process are unintentional.  Fritzi Mandes M.D    [facility]   CC: Primary care physician; Venia Carbon, MD

## 2022-12-07 NOTE — Significant Event (Signed)
Pt's bed alarm going off. Upon arrival to pt's room, pt was kicking and hitting staff. MD notified. IV removed by pt. Security called, RR called due to increasing agitation. MD arrived at bedside and put med orders in.

## 2022-12-07 NOTE — Progress Notes (Addendum)
[facility] for [facility]    Date of Admission:  12/05/2022   Total days of antibiotics 3/day 2 cefepime          ID: Frank Carlson is a 82 y.o. male with enterobacter bacteremia from uti Principal Problem:   Sepsis due to gram-negative UTI ([place]) Active Problems:   BPH with obstruction/lower urinary tract symptoms   Acute kidney injury superimposed on chronic kidney disease (HCC)   Dyslipidemia   Type 2 diabetes mellitus with chronic kidney disease (HCC)   Orthostatic hypotension    Subjective: Significant agitation this morning due to sundowning requiring multiple staff to help patient. Received medication for sedation. Encephalopathic this morning.  Medications:   enoxaparin (LOVENOX) injection  40 mg Subcutaneous Q24H   insulin aspart  0-9 Units Subcutaneous TID WC   multivitamin with minerals  1 tablet Oral q morning   simvastatin  20 mg Oral QHS   tamsulosin  0.8 mg Oral Daily    Objective: Vital signs in last 24 hours: Temp:  [97.5 F (36.4 C)-98.3 F (36.8 C)] 97.5 F (36.4 C) (01/26 8416) Pulse Rate:  [55-69] 55 (01/26 0808) Resp:  [15-18] 16 (01/26 0808) BP: (118-134)/(53-75) 122/62 (01/26 0808) SpO2:  [97 %-100 %] 99 % (01/26 0808)  Gen = sleepy in bed, in not acute distress   Lab Results Recent Labs    12/05/22 1648 12/06/22 0523 12/07/22 0448  WBC 18.2* 14.0*  --   HGB 10.4* 9.5*  --   HCT 32.2* 29.6*  --   NA 137 141  --   K 3.8 3.4*  --   CL 107 114*  --   CO2 23 23  --   BUN 29* 23  --   CREATININE 1.52* 1.29* 1.34*   Liver Panel Recent Labs    12/05/22 1648  PROT 7.3  ALBUMIN 3.2*  AST 25  ALT 21  ALKPHOS 40  BILITOT 0.6   Sedimentation Rate No results for input(s): "ESRSEDRATE" in the last 72 hours. C-Reactive Protein No results for input(s): "CRP" in the last 72 hours.  Microbiology: 1/24 1 of 4 bottles with GNR -- BCID showing enterobacter 1/24 urine cx - GNR Studies/Results: US RENAL  Result Date:  12/06/2022 CLINICAL DATA:  Abdominal pain and chronic kidney disease. Diabetes. EXAM: RENAL / URINARY TRACT ULTRASOUND COMPLETE COMPARISON:  06/12/2022 FINDINGS: Right Kidney: Renal measurements: 12.9 by 6.3 by 5.7 cm = volume: 243 mL. Echogenic right renal parenchyma with moderate to prominent hydronephrosis. 4.3 by 3.9 by 3.0 cm left kidney lower pole cyst with a single thin septation. Left Kidney: Renal measurements: 14.3 by 6.5 by 6.2 cm = volume: 303 mL. Echogenic left kidney with moderate to prominent hydronephrosis. 7.0 by 7.0 by 9.3 cm exophytic cyst from the lower pole appears simple where visualized. Bladder: Bilateral ureteral jets are visible. Layering debris in the urinary bladder. Prevoid bladder volume 235 cc, no change on postvoid imaging. Mild urinary bladder wall thickening given the degree of distension, bladder wall thickness of 0.4 cm. Other: None. IMPRESSION: 1. Moderate to prominent bilateral hydronephrosis, similar to prior. Echogenic kidneys compatible with medical renal disease. 2. Layering debris in the urinary bladder (which can correlate with urinary tract infection, blood products can cause a similar appearance). 3. Mild urinary bladder wall thickening. The patient was apparently unable to substantially void. 4. Bilateral renal cysts with benign imaging characteristics. Electronically Signed   By: Van Clines M.D.   On: 12/06/2022 10:38  CT Head Wo Contrast  Result Date: 12/05/2022 CLINICAL DATA:  Head trauma, minor (Age >= 65y). Dizziness with multiple unwitnessed falls. EXAM: CT HEAD WITHOUT CONTRAST TECHNIQUE: Contiguous axial images were obtained from the base of the skull through the vertex without intravenous contrast. RADIATION DOSE REDUCTION: This exam was performed according to the departmental dose-optimization program which includes automated exposure control, adjustment of the mA and/or kV according to patient size and/or use of iterative reconstruction  technique. COMPARISON:  Head CT and MRI 08/06/2020 FINDINGS: Brain: There is no evidence of an acute infarct, intracranial hemorrhage, midline shift, or extra-axial fluid collection. A large right middle cranial fossa arachnoid cyst is unchanged. There is mild cerebral atrophy. There is unchanged diffuse dural calcification over the right cerebral convexity. Vascular: Calcified atherosclerosis at the skull base. No hyperdense vessel. Skull: No acute fracture or suspicious osseous lesion. Sinuses/Orbits: Visualized paranasal sinuses and mastoid air cells are clear. Bilateral cataract extraction. Other: None. IMPRESSION: No evidence of acute intracranial abnormality. Electronically Signed   By: Logan Bores M.D.   On: 12/05/2022 17:53   DG Chest 2 View  Result Date: 12/05/2022 CLINICAL DATA:  Multiple falls, dizziness EXAM: CHEST - 2 VIEW COMPARISON:  08/06/2020 FINDINGS: Normal heart size, mediastinal contours, and pulmonary vascularity. Calcified granulomata LEFT lung. Increased RIGHT basilar atelectasis. Remaining lungs clear. No acute infiltrate, pleural effusion, or pneumothorax. No osseous abnormalities. IMPRESSION: Old granulomatous disease and increased RIGHT basilar atelectasis. Electronically Signed   By: Lavonia Dana M.D.   On: 12/05/2022 17:15     Assessment/Plan: Enterobacter bacteremia 2/2 urinary source = continue on cefepime until sensitivities return than can hopefully devise an oral regimen to complete course of therapy  Delirium from sundowning = defer to primary team to manage.  Dr Juleen China from ID to see him on Monday. Dr Linus Salmons available by phone over the weekend.  [facility] Pager: [phone]  12/07/2022, 12:59 PM

## 2022-12-07 NOTE — TOC CM/SW Note (Signed)
  Transition of Care [facility]) Screening Note   Patient Details  Name: Frank Carlson Date of Birth: 30-Dec-1940   Transition of Care [facility]) CM/SW Contact:    Candie Chroman, LCSW Phone Number: 12/07/2022, 10:46 AM    Transition of Care Department [facility]) has reviewed patient and no TOC needs have been identified at this time. We will continue to monitor patient advancement through interdisciplinary progression rounds. If new patient transition needs arise, please place a TOC consult.

## 2022-12-07 NOTE — Progress Notes (Signed)
   12/07/22 0700  Spiritual Encounters  Type of Visit Initial  Conversation partners present during encounter Nurse  Referral source Code page  Reason for visit Code  OnCall Visit Yes  Spiritual Care Plan  Spiritual Care Issues Still Outstanding No further spiritual care needs at this time (see row info)   Chaplain responded to medical alert overhead message. Consulted with the charge Nurse, Nurse said "It  wasn't anything major... the patient just needed to use the bathroom" Please call us if need be.

## 2022-12-07 NOTE — Progress Notes (Signed)
CROSS COVER NOTE  NAME: ZAIAH ECKERSON MRN: [id number] DOB : Jul 25, 1941    HPI/Events of Note     Assessment and  Interventions   Assessment: Acute delirium with confusion, ansety and paranoia. Verbalizes fear of dying Plan: Haldol 2.5 mg x1 Sitter - delirium treatment/precaustions     Kathlene Cote NP [facility]

## 2022-12-08 DIAGNOSIS — E785 Hyperlipidemia, unspecified: Secondary | ICD-10-CM | POA: Diagnosis not present

## 2022-12-08 DIAGNOSIS — A4150 Gram-negative sepsis, unspecified: Secondary | ICD-10-CM | POA: Diagnosis not present

## 2022-12-08 DIAGNOSIS — N179 Acute kidney failure, unspecified: Secondary | ICD-10-CM | POA: Diagnosis not present

## 2022-12-08 DIAGNOSIS — G9341 Metabolic encephalopathy: Secondary | ICD-10-CM | POA: Diagnosis not present

## 2022-12-08 LAB — URINE CULTURE: Culture: >=100000 — AB

## 2022-12-08 LAB — CULTURE, BLOOD (ROUTINE X 2)

## 2022-12-08 LAB — GLUCOSE, CAPILLARY
Glucose-Capillary: 149 mg/dL — ABNORMAL HIGH (ref 70–99)
Glucose-Capillary: 121 mg/dL — ABNORMAL HIGH (ref 70–99)
Glucose-Capillary: 142 mg/dL — ABNORMAL HIGH (ref 70–99)

## 2022-12-08 MED ORDER — QUETIAPINE FUMARATE 25 MG PO TABS
25.0000 mg | ORAL_TABLET | Freq: Every evening | ORAL | Status: DC | PRN
  Administered 2022-12-09: 25 mg via ORAL
  Filled 2022-12-08: qty 1

## 2022-12-08 MED ORDER — CIPROFLOXACIN HCL 500 MG PO TABS: 500.0000 mg | ORAL_TABLET | Freq: Two times a day (BID) | ORAL | Status: DC

## 2022-12-08 MED ORDER — CIPROFLOXACIN HCL 500 MG PO TABS
500.0000 mg | ORAL_TABLET | Freq: Two times a day (BID) | ORAL | Status: DC
  Administered 2022-12-08 – 2022-12-09 (×3): 500 mg via ORAL
  Filled 2022-12-08 (×3): qty 1

## 2022-12-08 NOTE — Progress Notes (Signed)
Pharmacy Antibiotic Note  Frank Carlson is a 82 y.o. male admitted on 12/05/2022 with bacteremia/UTI.  Pharmacy has been consulted for cefepime dosing. He notes urinary frequency and dysuria.  Patient self catheterizes as home.   Today, 12/08/2022 Day 3 cefepime Renal: ? Mild AKI - 1/26: SCr 1.34 (slightly up from previous) WBC = 18 to 14 - no CBC 1/26, 1/27 Afebrile Blood culture with Enterobacter cloacae - Resistant to Cefazolin, Intermediate to Nitrofurantoin Renal US 1/25 with BL hydronephrosis - unchanged from Aug (has PMH BPH) ID consult  Plan: For Enterobacter in blood cx, continue cefepime 2gm IV q12h based on Crcl 44.5 ml/min Monitor renal function, culture/susceptibilities, clinical improvement  Height: '5\' 11"'$  (180.3 cm) Weight: 86.2 kg (190 lb 0.6 oz) IBW/kg (Calculated) : 75.3  Temp (24hrs), Avg:97.7 F (36.5 C), Min:97.5 F (36.4 C), Max:98.3 F (36.8 C)  Recent Labs  Lab 12/05/22 1648 12/05/22 1649 12/06/22 0523 12/07/22 0448  WBC 18.2*  --  14.0*  --   CREATININE 1.52*  --  1.29* 1.34*  LATICACIDVEN  --  1.4  --   --      Estimated Creatinine Clearance: 44.5 mL/min (A) (by C-G formula based on SCr of 1.34 mg/dL (H)).    Allergies  Allergen Reactions   Bee Venom Other (See Comments)    Passed out    Antimicrobials this admission: Ceftriaxone 1/24 >>1/25 Cefepime 1/25 >>  Dose adjustments this admission:  Microbiology results: 1/24 BCx: GNR= E. Cloacae 1/24 UCx:   E. Cloacae   Thank you for allowing pharmacy to be a part of this patient's care.  Chinita Greenland PharmD Clinical Pharmacist 12/08/2022

## 2022-12-08 NOTE — Progress Notes (Signed)
[facility] [place] NAME: Frank Carlson    MR#:  [id number]  DATE OF BIRTH:  Nov 19, 1940  SUBJECTIVE:  wife at bedside. Patient much calmer and pleasant today. Does have cognitive decline. Remembers his children name and wife's name. Has been somewhat confused. Knows [place] is [place]. Talks about doctors having party. VITALS:  Blood pressure 128/64, pulse (!) 51, temperature 98.3 F (36.8 C), temperature source Oral, resp. rate 18, height '5\' 11"'$  (1.803 m), weight 86.2 kg, SpO2 99 %.  PHYSICAL EXAMINATION:   GENERAL:  82 y.o.-year-old patient lying in the bed with no acute distress.  LUNGS: Normal breath sounds bilaterally, no wheezing CARDIOVASCULAR: S1, S2 normal. No murmur   ABDOMEN: Soft, nontender, nondistended. Bowel sounds present.  EXTREMITIES: No  edema b/l.    NEUROLOGIC: nonfocal. Alert. Confused at baseline SKIN: per Rn  LABORATORY PANEL:  CBC Recent Labs  Lab 12/06/22 0523  WBC 14.0*  HGB 9.5*  HCT 29.6*  PLT 178     Chemistries  Recent Labs  Lab 12/05/22 1648 12/06/22 0523 12/07/22 0448  NA 137 141  --   K 3.8 3.4*  --   CL 107 114*  --   CO2 23 23  --   GLUCOSE 194* 139*  --   BUN 29* 23  --   CREATININE 1.52* 1.29* 1.34*  CALCIUM 8.4* 7.9*  --   AST 25  --   --   ALT 21  --   --   ALKPHOS 40  --   --   BILITOT 0.6  --   --     Cardiac Enzymes No results for input(s): "TROPONINI" in the last 168 hours. RADIOLOGY:  No results found.  Assessment and Plan  SAAGAR Carlson is a 82 y.o. Caucasian male with medical history significant for type 2 diabetes mellitus, dyslipidemia and BPH, who presented to the emergency room with acute onset of dizziness and fall when he was trying to get up and stand.  He self catheterizes and noticed urine has been cloudy.   Sepsis due to gram-negative UTI (HCC) Enterococcus Cloacae H/o BPH--self catheterization - came in with leukocytosis and tachypnea of  21. --  IV Rocephin. - Consultation with Dr. Baxter Flattery -- patient received IV fluids --US renal shows bilateral moderate hydronephrosis, bladder debris/wall thickening, renal cyst (similar to prior) --pt has h/o BPH and follows urology in [place]--per pt was told to self cath 4 times a day" --cont flomax -- good urine output. -- Discussed with ID on call Dr.Comer-- will change to PO Cipro-- total treatment for seven days. -- No fever  Acute metabolic encephalopathy secondary to sepsis with delirium patient likely has baseline dementia/cognitive decline -- patient very agitated, pulled out IV, rapid response called. Received Ativan and Haldol. Continues to remain agitated. Will give another round of Ativan and IV Benadryl. Vitals are stable so far. 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 Patient much calmer. -- Per wife patient has memory issues  Orthostatic hypotension - due to  volume depletion and dehydration. - It is likely the culprit for recurrent falls. --improved BP --d/c ivf   Acute kidney injury superimposed on chronic kidney disease ([place]) 3a - AKI on CKD stage 3A. - creat down to 1.2 with IVF   Dyslipidemia -  continue statin and Lovaza.   BPH with obstruction/lower urinary tract symptoms - as above   Type 2 diabetes mellitus with chronic kidney disease ([place]) -  ssi for now - We will hold off metformin given AKI.  Fall with weakness -- patient ambulated very well with mobility therapist using a walker. Will ensure patient has walker at home.  Stable will discharge to home tomorrow. Wife agreeable.  Family communication : wife  Consults :ID CODE STATUS: Full DVT Prophylaxis :lovenox Level of care: Telemetry Medical Status is: Inpatient Remains inpatient appropriate because: sepsis    TOTAL TIME TAKING CARE OF THIS PATIENT: 35 minutes.  >50% time spent on counselling and coordination of care  Note: This dictation was prepared with Dragon dictation along with smaller  phrase technology. Any transcriptional errors that result from this process are unintentional.  Fritzi Mandes M.D    [facility]   CC: Primary care physician; Venia Carbon, MD
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
## 2022-12-08 NOTE — Progress Notes (Addendum)
Mobility Specialist - Progress Note     12/08/22 1400  Mobility  Activity Ambulated with assistance in hallway;Stood at bedside;Dangled on edge of bed  Level of Assistance Contact guard assist, steadying assist  Assistive Device Front wheel walker  Distance Ambulated (ft) 200 ft  Range of Motion/Exercises Active  Activity Response Tolerated well   Pt resting in bed on RA upon entry. Pt agreeable to participate in mobility. Pt is a little impulsive and tried to quickly stand at the beginning of the session. Pt STS and ambulates to hallway CGA. Pt intially begins furniture surfing (grabbing onto anything near by to keep his balance) and denies needing a walker. Author initiates walker for the remainder of the session and patient maintains balance. (161f) Pt returned to bed and left with needs in reach and bed alarm activated.   ALoma SenderMobility Specialist 12/08/22, 3:08 PM

## 2022-12-08 NOTE — TOC Progression Note (Signed)
Transition of Care [facility]) - Progression Note    Patient Details  Name: Frank Carlson MRN: [id number] Date of Birth: [birth year]  Transition [facility] Care [facility]) CM/SW Contact  Zigmund Daniel Dorian Pod, RN Phone Number: 12/08/2022, 3:00 PM  Clinical Narrative:    RN unsuccessful attempts to reach the pt's spouse Katharine Look) today. Spouse no longer at bedside however RN able to leave a message to the home contact. Currently awaiting a call back and will discuss any discharge needs for DME or resources to support care for this pt upon his discharge.  TOC will follow up as needed.         Expected Discharge Plan and Services                                               Social Determinants of Health (SDOH) Interventions SDOH Screenings   Food Insecurity: No Food Insecurity (12/06/2022)  Housing: Low Risk  (12/06/2022)  Transportation Needs: No Transportation Needs (12/06/2022)  Utilities: Not At Risk (12/06/2022)  Depression (PHQ2-9): Low Risk  (05/23/2022)  Financial Resource Strain: Low Risk  (09/22/2020)  Tobacco Use: Medium Risk (12/05/2022)    Readmission Risk Interventions     No data to display

## 2022-12-08 NOTE — TOC Transition Note (Signed)
Transition of Care [facility]) - CM/SW Discharge Note   Patient Details  Name: Frank Carlson MRN: [id number] Date of Birth: [birth year]  Transition [facility] Care [facility]) CM/SW Contact:  Harriet Masson, RN Phone Number:[phone] 12/08/2022, 4:22 PM   Clinical Narrative:    Spoke with the spouse concerning discharge disposition.  Only needs a RW and agreed to [employer] who will contact the wife directly. Wife as agreed to the requested DME. No additional needs. Wife will transport pt home tomorrow and able to afford pt's medications.   TOC will continue to be available for any additional needs.          Patient Goals and CMS Choice      Discharge Placement                         Discharge Plan and Services Additional resources added to the After Visit Summary for                                       Social Determinants of Health (SDOH) Interventions SDOH Screenings   Food Insecurity: No Food Insecurity (12/06/2022)  Housing: Low Risk  (12/06/2022)  Transportation Needs: No Transportation Needs (12/06/2022)  Utilities: Not At Risk (12/06/2022)  Depression (PHQ2-9): Low Risk  (05/23/2022)  Financial Resource Strain: Low Risk  (09/22/2020)  Tobacco Use: Medium Risk (12/05/2022)     Readmission Risk Interventions     No data to display

## 2022-12-09 DIAGNOSIS — N390 Urinary tract infection, site not specified: Secondary | ICD-10-CM | POA: Diagnosis not present

## 2022-12-09 DIAGNOSIS — A4150 Gram-negative sepsis, unspecified: Secondary | ICD-10-CM | POA: Diagnosis not present

## 2022-12-09 LAB — CREATININE, SERUM
Creatinine, Ser: 1.32 mg/dL — ABNORMAL HIGH (ref 0.61–1.24)
GFR, Estimated: 54 mL/min — ABNORMAL LOW (ref >60)

## 2022-12-09 LAB — GLUCOSE, CAPILLARY
Glucose-Capillary: 112 mg/dL — ABNORMAL HIGH (ref 70–99)
Glucose-Capillary: 131 mg/dL — ABNORMAL HIGH (ref 70–99)

## 2022-12-09 MED ORDER — CIPROFLOXACIN HCL 500 MG PO TABS: 500.0000 mg | ORAL_TABLET | Freq: Two times a day (BID) | ORAL | 0 refills | Status: DC

## 2022-12-09 NOTE — TOC Transition Note (Addendum)
Transition of Care [facility]) - CM/SW Discharge Note   Patient Details  Name: Frank Carlson MRN: [id number] Date of Birth: 1941/09/26  Transition of Care [facility]) CM/SW Contact:  Harriet Masson, RN Phone Number:[phone] 12/09/2022, 10:17 AM   Clinical Narrative:     Spoke with wife Katharine Look) concerning her requested for assistance in the home. Provided private duty/aide resources if she needs further assistance. Wife aware this maybe an out of pocket expense. Also had a discussion on LTC to assist pt with his ongoing needs if she is unable to assist. Wife states she will have some assistance from her son Patrick Jupiter however this is not long term. Wife aware if any other concerns or issues arise to reach out to this RN via floor staff or directly.  TOC remains available for any additional needs.        Patient Goals and CMS Choice      Discharge Placement                         Discharge Plan and Services Additional resources added to the After Visit Summary for                                       Social Determinants of Health (SDOH) Interventions SDOH Screenings   Food Insecurity: No Food Insecurity (12/06/2022)  Housing: Low Risk  (12/06/2022)  Transportation Needs: No Transportation Needs (12/06/2022)  Utilities: Not At Risk (12/06/2022)  Depression (PHQ2-9): Low Risk  (05/23/2022)  Financial Resource Strain: Low Risk  (09/22/2020)  Tobacco Use: Medium Risk (12/05/2022)     Readmission Risk Interventions     No data to display

## 2022-12-09 NOTE — Plan of Care (Signed)
IV removed, discharge instructions to be given to wife upon discharge, patient will be discharged to home with family

## 2022-12-09 NOTE — Progress Notes (Signed)
Pt becoming more confused and agitated as night progresses, pt has gotten out of bed and triggered bed alarm several times, has pulled off pure-wick and condom caths, pt not easily re-oriented at this time, takes some time to convince pt to return to bed, MD notified, pt cleaned, linens changed, bed alarm activated and audile, will monitor closely

## 2022-12-09 NOTE — Progress Notes (Signed)
Pt resting w/ eyes closed, calm and in bed. Lab at bedside, requested by nursing to return later in am. Lab agreed. Will f/u later in am

## 2022-12-09 NOTE — Discharge Summary (Signed)
Physician Discharge Summary   Patient: Frank Carlson MRN: [id number] DOB: Nov 05, 1941  Admit date:     12/05/2022  Discharge date: 12/09/22  Discharge Physician: Fritzi Mandes   PCP: Venia Carbon, MD   Recommendations at discharge:      Discharge Diagnoses: Principal Problem:   Sepsis due to gram-negative UTI [facility]) Active Problems:   Acute kidney injury superimposed on chronic kidney disease ([place])   Orthostatic hypotension   Dyslipidemia   BPH with obstruction/lower urinary tract symptoms   Type 2 diabetes mellitus with chronic kidney disease (Frank Carlson)  Frank Carlson is a 82 y.o. Caucasian male with medical history significant for type 2 diabetes mellitus, dyslipidemia and BPH, who presented to the emergency room with acute onset of dizziness and fall when he was trying to get up and stand.  He self catheterizes and noticed urine has been cloudy.    Sepsis due to gram-negative UTI (HCC) Enterococcus Cloacae H/o BPH--self catheterization - came in with leukocytosis and tachypnea of 21. --  IV Rocephin. - Consultation with Dr. Baxter Flattery -- patient received IV fluids --Frank Carlson renal shows bilateral moderate hydronephrosis, bladder debris/wall thickening, renal cyst (similar to prior) --pt has h/o BPH and follows urology in [place]--per pt was told to self cath 4 times a day" --cont flomax -- good urine output. -- Discussed with ID on call Dr.Comer-- will change to PO Cipro-- total treatment for seven days. -- No fever   Acute metabolic encephalopathy secondary to sepsis with delirium patient likely has baseline dementia/cognitive decline -- patient very agitated, pulled out IV, rapid response called. Received Ativan and Haldol. Continues to remain agitated. Will give another round of Ativan and IV Benadryl. Vitals are stable so far. 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 Patient much calmer. -- Per wife patient has memory issues. D/w wife --defer Dementia w/u as out pt. Discussed option  for ALF via PCP   Orthostatic hypotension - due to  volume depletion and dehydration. --improved BP --d/c ivf   Acute kidney injury superimposed on chronic kidney disease (Frank Carlson) 3a - AKI on CKD stage 3A. - creat down to 1.2 with IVF   Dyslipidemia -  continue statin and Lovaza.   BPH with obstruction/lower urinary tract symptoms - as above   Type 2 diabetes mellitus with chronic kidney disease (Frank Carlson) - ssi for now - creat stable. Resume home meds at d/c   Fall with weakness -- patient ambulated very well with mobility therapist using a walker.  patient has walker to take home.      Family communication : wife on the phone Consults :ID CODE STATUS: Full DVT Prophylaxis :lovenox     Disposition: Home Diet recommendation:  Discharge Diet Orders (From admission, onward)     Start     Ordered   12/09/22 0000  Diet - low sodium heart healthy        12/09/22 0952           Cardiac and Carb modified diet DISCHARGE MEDICATION: Allergies as of 12/09/2022       Reactions   Bee Venom Other (See Comments)   Passed out        Medication List     STOP taking these medications    FISH OIL PO   simvastatin 20 MG tablet Commonly known as: ZOCOR   True Metrix Blood Glucose Test test strip Generic drug: glucose blood       TAKE these medications    ciprofloxacin 500 MG tablet Commonly  known as: CIPRO Take 1 tablet (500 mg total) by mouth 2 (two) times daily.   metFORMIN 500 MG tablet Commonly known as: GLUCOPHAGE TAKE 1 TABLET TWICE DAILY WITH A MEAL   multivitamin with minerals Tabs tablet Take 1 tablet by mouth every morning.   tamsulosin 0.4 MG Caps capsule Commonly known as: FLOMAX Take 0.8 mg by mouth daily.   TRUEplus Lancets 30G Misc USE TO TEST BLOOD SUGAR EVERY DAY               Durable Medical Equipment  (From admission, onward)           Start     Ordered   12/08/22 1624  For home use only DME Walker rolling  Once        Question Answer Comment  Walker: With 5 Inch Wheels   Patient needs a walker to treat with the following condition Generalized weakness      12/08/22 1625            Discharge Exam: Filed Weights   12/05/22 1550  Weight: 86.2 kg     Condition at discharge: fair  The results of significant diagnostics from this hospitalization (including imaging, microbiology, ancillary and laboratory) are listed below for reference.   Imaging Studies: Frank Carlson RENAL  Result Date: 12/06/2022 CLINICAL DATA:  Abdominal pain and chronic kidney disease. Diabetes. EXAM: RENAL / URINARY TRACT ULTRASOUND COMPLETE COMPARISON:  06/12/2022 FINDINGS: Right Kidney: Renal measurements: 12.9 by 6.3 by 5.7 cm = volume: 243 mL. Echogenic right renal parenchyma with moderate to prominent hydronephrosis. 4.3 by 3.9 by 3.0 cm left kidney lower pole cyst with a single thin septation. Left Kidney: Renal measurements: 14.3 by 6.5 by 6.2 cm = volume: 303 mL. Echogenic left kidney with moderate to prominent hydronephrosis. 7.0 by 7.0 by 9.3 cm exophytic cyst from the lower pole appears simple where visualized. Bladder: Bilateral ureteral jets are visible. Layering debris in the urinary bladder. Prevoid bladder volume 235 cc, no change on postvoid imaging. Mild urinary bladder wall thickening given the degree of distension, bladder wall thickness of 0.4 cm. Other: None. IMPRESSION: 1. Moderate to prominent bilateral hydronephrosis, similar to prior. Echogenic kidneys compatible with medical renal disease. 2. Layering debris in the urinary bladder (which can correlate with urinary tract infection, blood products can cause a similar appearance). 3. Mild urinary bladder wall thickening. The patient was apparently unable to substantially void. 4. Bilateral renal cysts with benign imaging characteristics. Electronically Signed   By: Van Clines M.D.   On: 12/06/2022 10:38   CT Head Wo Contrast  Result Date: 12/05/2022 CLINICAL  DATA:  Head trauma, minor (Age >= 65y). Dizziness with multiple unwitnessed falls. EXAM: CT HEAD WITHOUT CONTRAST TECHNIQUE: Contiguous axial images were obtained from the base of the skull through the vertex without intravenous contrast. RADIATION DOSE REDUCTION: This exam was performed according to the departmental dose-optimization program which includes automated exposure control, adjustment of the mA and/or kV according to patient size and/or use of iterative reconstruction technique. COMPARISON:  Head CT and MRI 08/06/2020 FINDINGS: Brain: There is no evidence of an acute infarct, intracranial hemorrhage, midline shift, or extra-axial fluid collection. A large right middle cranial fossa arachnoid cyst is unchanged. There is mild cerebral atrophy. There is unchanged diffuse dural calcification over the right cerebral convexity. Vascular: Calcified atherosclerosis at the skull base. No hyperdense vessel. Skull: No acute fracture or suspicious osseous lesion. Sinuses/Orbits: Visualized paranasal sinuses and mastoid air cells are clear. Bilateral  cataract extraction. Other: None. IMPRESSION: No evidence of acute intracranial abnormality. Electronically Signed   By: Logan Bores M.D.   On: 12/05/2022 17:53   DG Chest 2 View  Result Date: 12/05/2022 CLINICAL DATA:  Multiple falls, dizziness EXAM: CHEST - 2 VIEW COMPARISON:  08/06/2020 FINDINGS: Normal heart size, mediastinal contours, and pulmonary vascularity. Calcified granulomata LEFT lung. Increased RIGHT basilar atelectasis. Remaining lungs clear. No acute infiltrate, pleural effusion, or pneumothorax. No osseous abnormalities. IMPRESSION: Old granulomatous disease and increased RIGHT basilar atelectasis. Electronically Signed   By: Lavonia Dana M.D.   On: 12/05/2022 17:15    Microbiology: Results for orders placed or performed during the hospital encounter of 12/05/22  Culture, blood (routine x 2)     Status: Abnormal   Collection Time: 12/05/22  4:49  PM   Specimen: BLOOD  Result Value Ref Range Status   Specimen Description   Final    BLOOD LEFT ANTECUBITAL Performed at [facility], [address] [place], [place] [address]    Special Requests   Final    BOTTLES DRAWN AEROBIC AND ANAEROBIC Blood Culture results may not be optimal due to an excessive volume of blood received in culture bottles Performed at [facility], [address] [place], [place] [address]    Culture  Setup Time   Final    AEROBIC BOTTLE ONLY GRAM NEGATIVE RODS CRITICAL RESULT CALLED TO, READ BACK BY AND VERIFIED WITH: KRISTEN MERRILL 12/06/22 0921 KLW    Culture (A)  Final    ENTEROBACTER CLOACAE SUSCEPTIBILITIES PERFORMED ON PREVIOUS CULTURE WITHIN THE LAST 5 DAYS. Performed at [facility], [place] [address], [place], [place] [address]    Report Status 12/08/2022 FINAL  Final  Blood Culture ID Panel (Reflexed)     Status: Abnormal   Collection Time: 12/05/22  4:49 PM  Result Value Ref Range Status   Enterococcus faecalis NOT DETECTED NOT DETECTED Final   Enterococcus Faecium NOT DETECTED NOT DETECTED Final   Listeria monocytogenes NOT DETECTED NOT DETECTED Final   Staphylococcus species NOT DETECTED NOT DETECTED Final   Staphylococcus aureus (BCID) NOT DETECTED NOT DETECTED Final   Staphylococcus epidermidis NOT DETECTED NOT DETECTED Final   Staphylococcus lugdunensis NOT DETECTED NOT DETECTED Final   Streptococcus species NOT DETECTED NOT DETECTED Final   Streptococcus agalactiae NOT DETECTED NOT DETECTED Final   Streptococcus pneumoniae NOT DETECTED NOT DETECTED Final   Streptococcus pyogenes NOT DETECTED NOT DETECTED Final   A.calcoaceticus-baumannii NOT DETECTED NOT DETECTED Final   Bacteroides fragilis NOT DETECTED NOT DETECTED Final   Enterobacterales DETECTED (A) NOT DETECTED Final    Comment: Enterobacterales represent a large order of gram negative bacteria, not a single organism. CRITICAL RESULT CALLED TO, READ  BACK BY AND VERIFIED WITH: KRISTEN MERRILL 12/06/22 0921 KLW    Enterobacter cloacae complex DETECTED (A) NOT DETECTED Final    Comment: CRITICAL RESULT CALLED TO, READ BACK BY AND VERIFIED WITH: KRISTEN MERRILL 12/06/22 0921 KLW    Escherichia coli NOT DETECTED NOT DETECTED Final   Klebsiella aerogenes NOT DETECTED NOT DETECTED Final   Klebsiella oxytoca NOT DETECTED NOT DETECTED Final   Klebsiella pneumoniae NOT DETECTED NOT DETECTED Final   Proteus species NOT DETECTED NOT DETECTED Final   Salmonella species NOT DETECTED NOT DETECTED Final   Serratia marcescens NOT DETECTED NOT DETECTED Final   Haemophilus influenzae NOT DETECTED NOT DETECTED Final   Neisseria meningitidis NOT DETECTED NOT DETECTED Final   Pseudomonas aeruginosa NOT DETECTED  NOT DETECTED Final   Stenotrophomonas maltophilia NOT DETECTED NOT DETECTED Final   Candida albicans NOT DETECTED NOT DETECTED Final   Candida auris NOT DETECTED NOT DETECTED Final   Candida glabrata NOT DETECTED NOT DETECTED Final   Candida krusei NOT DETECTED NOT DETECTED Final   Candida parapsilosis NOT DETECTED NOT DETECTED Final   Candida tropicalis NOT DETECTED NOT DETECTED Final   Cryptococcus neoformans/gattii NOT DETECTED NOT DETECTED Final   CTX-M ESBL NOT DETECTED NOT DETECTED Final   Carbapenem resistance IMP NOT DETECTED NOT DETECTED Final   Carbapenem resistance KPC NOT DETECTED NOT DETECTED Final   Carbapenem resistance NDM NOT DETECTED NOT DETECTED Final   Carbapenem resist OXA 48 LIKE NOT DETECTED NOT DETECTED Final   Carbapenem resistance VIM NOT DETECTED NOT DETECTED Final    Comment: Performed at [facility], [address] [place], [place] [address]  Culture, blood (routine x 2)     Status: None (Preliminary result)   Collection Time: 12/05/22  4:54 PM   Specimen: BLOOD  Result Value Ref Range Status   Specimen Description BLOOD BLOOD LEFT HAND  Final   Special Requests   Final    BOTTLES DRAWN AEROBIC AND  ANAEROBIC Blood Culture results may not be optimal due to an excessive volume of blood received in culture bottles   Culture   Final    NO GROWTH 4 DAYS Performed at [facility], [address] [place], [place] [address]    Report Status PENDING  Incomplete  Urine Culture     Status: Abnormal   Collection Time: 12/05/22  7:24 PM   Specimen: In/Out Cath Urine  Result Value Ref Range Status   Specimen Description   Final    IN/OUT CATH URINE Performed at [facility], [address] [place], [place] [address]    Special Requests   Final    NONE Performed at [facility], [address] [place], [place] [address]    Culture >=100,000 COLONIES/mL ENTEROBACTER CLOACAE (A)  Final   Report Status 12/08/2022 FINAL  Final   Organism ID, Bacteria ENTEROBACTER CLOACAE (A)  Final      Susceptibility   Enterobacter cloacae - MIC*    CEFAZOLIN >=64 RESISTANT Resistant     CEFEPIME <=0.12 SENSITIVE Sensitive     CIPROFLOXACIN <=0.25 SENSITIVE Sensitive     GENTAMICIN <=1 SENSITIVE Sensitive     IMIPENEM <=0.25 SENSITIVE Sensitive     NITROFURANTOIN 64 INTERMEDIATE Intermediate     TRIMETH/SULFA <=20 SENSITIVE Sensitive     PIP/TAZO <=4 SENSITIVE Sensitive     * >=100,000 COLONIES/mL ENTEROBACTER CLOACAE    Labs: CBC: Recent Labs  Lab 12/05/22 1648 12/06/22 0523  WBC 18.2* 14.0*  NEUTROABS 14.8*  --   HGB 10.4* 9.5*  HCT 32.2* 29.6*  MCV 86.8 87.6  PLT 171 979   Basic Metabolic Panel: Recent Labs  Lab 12/05/22 1648 12/06/22 0523 12/07/22 0448 12/09/22 0745  NA 137 141  --   --   K 3.8 3.4*  --   --   CL 107 114*  --   --   CO2 23 23  --   --   GLUCOSE 194* 139*  --   --   BUN 29* 23  --   --   CREATININE 1.52* 1.29* 1.34* 1.32*  CALCIUM 8.4* 7.9*  --   --    Liver Function Tests: Recent Labs  Lab 12/05/22 1648  AST 25  ALT  21  ALKPHOS 40  BILITOT 0.6  PROT 7.3  ALBUMIN 3.2*   CBG: Recent Labs  Lab 12/07/22 2049  12/08/22 1159 12/08/22 1612 12/08/22 2116 12/09/22 0751  GLUCAP 180* 149* 121* 142* 112*    Discharge time spent: greater than 30 minutes.  Signed: Fritzi Mandes, MD [facility] 12/09/2022

## 2022-12-10 ENCOUNTER — Encounter: Payer: Self-pay | Admitting: Internal Medicine

## 2022-12-10 ENCOUNTER — Telehealth: Payer: Self-pay

## 2022-12-10 LAB — CULTURE, BLOOD (ROUTINE X 2): Culture: NO GROWTH

## 2022-12-10 NOTE — Patient Outreach (Signed)
  Care Coordination [facility] Note Transition Care Management Unsuccessful Follow-up Telephone Call  Date of discharge and from where:  [facility] 12/09/22  Attempts:  1st Attempt  Reason for unsuccessful TCM follow-up call:  Left voice message  Johnney Killian, RN, BSN, CCM Care Management Coordinator [facility] Phone: [phone]: [phone]

## 2022-12-11 ENCOUNTER — Telehealth: Payer: Self-pay

## 2022-12-11 NOTE — Telephone Encounter (Signed)
error 

## 2022-12-11 NOTE — Patient Outreach (Signed)
  Care Coordination TOC Note Transition Care Management Follow-up Telephone Call Date of discharge and from where: [facility] 12/09/22 How have you been since you were released from the hospital? "I am feeling very well" Any questions or concerns? No  Items Reviewed: Did the pt receive and understand the discharge instructions provided? Yes  Medications obtained and verified? Yes  Other? No  Any new allergies since your discharge? No  Dietary orders reviewed? Yes Do you have support at home? No   Home Care and Equipment/Supplies: Were home health services ordered? no If so, what is the name of the agency? N/a  Has the agency set up a time to come to the patient's home? not applicable Were any new equipment or medical supplies ordered?  No What is the name of the medical supply agency? N/a Were you able to get the supplies/equipment? not applicable Do you have any questions related to the use of the equipment or supplies? No  Functional Questionnaire: (I = Independent and D = Dependent) ADLs: needs assistance  Bathing/Dressing- needs assistance  Meal Prep- d  Eating- I  Maintaining continence- I  Transferring/Ambulation- supervision  Managing Meds- I  Follow up appointments reviewed:  PCP Hospital f/u appt confirmed? No   Specialist Hospital f/u appt confirmed? No   Are transportation arrangements needed? No  If their condition worsens, is the pt aware to call PCP or go to the Emergency Dept.? Yes Was the patient provided with contact information for the PCP's office or ED? Yes Was to pt encouraged to call back with questions or concerns? Yes  SDOH assessments and interventions completed:   Yes SDOH Interventions Today    Flowsheet Row Most Recent Value  SDOH Interventions   Food Insecurity Interventions Intervention Not Indicated  Housing Interventions Intervention Not Indicated      Interventions Today    Flowsheet Row Most Recent Value  Chronic Disease  Discussed/Reviewed   Chronic disease discussed/reviewed during today's visit Other  [UTI]  General Adult Interventions   General Interventions Discussed/Reviewed General Interventions Discussed, Doctor Visits  Pharmacy Interventions   Pharmacy Dicussed/Reviewed Pharmacy Topics Discussed, Medications and their functions        Care Coordination Interventions:  PCP follow up appointment requested   Encounter Outcome:  Pt. Visit Completed

## 2022-12-17 ENCOUNTER — Encounter: Payer: Self-pay | Admitting: Internal Medicine

## 2022-12-17 ENCOUNTER — Ambulatory Visit (INDEPENDENT_AMBULATORY_CARE_PROVIDER_SITE_OTHER): Payer: Medicare HMO | Admitting: Internal Medicine

## 2022-12-17 VITALS — BP 120/72 | HR 66 | Temp 97.3°F | Ht 71.5 in | Wt 191.0 lb

## 2022-12-17 DIAGNOSIS — A4150 Gram-negative sepsis, unspecified: Secondary | ICD-10-CM

## 2022-12-17 DIAGNOSIS — E1122 Type 2 diabetes mellitus with diabetic chronic kidney disease: Secondary | ICD-10-CM | POA: Diagnosis not present

## 2022-12-17 DIAGNOSIS — N1831 Chronic kidney disease, stage 3a: Secondary | ICD-10-CM | POA: Diagnosis not present

## 2022-12-17 DIAGNOSIS — N390 Urinary tract infection, site not specified: Secondary | ICD-10-CM

## 2022-12-17 LAB — RENAL FUNCTION PANEL
Albumin: 3.8 g/dL (ref 3.5–5.2)
BUN: 25 mg/dL — ABNORMAL HIGH (ref 6–23)
CO2: 29 mEq/L (ref 19–32)
Calcium: 9.3 mg/dL (ref 8.4–10.5)
Chloride: 106 mEq/L (ref 96–112)
Creatinine, Ser: 1.23 mg/dL (ref 0.40–1.50)
GFR: 54.18 mL/min — ABNORMAL LOW (ref >60.00)
Glucose, Bld: 89 mg/dL (ref 70–99)
Phosphorus: 3.3 mg/dL (ref 2.3–4.6)
Potassium: 4.2 mEq/L (ref 3.5–5.1)
Sodium: 143 mEq/L (ref 135–145)

## 2022-12-17 LAB — CBC
HCT: 34.6 % — ABNORMAL LOW (ref 39.0–52.0)
Hemoglobin: 11.6 g/dL — ABNORMAL LOW (ref 13.0–17.0)
MCHC: 33.6 g/dL (ref 30.0–36.0)
MCV: 85.1 fl (ref 78.0–100.0)
Platelets: 228 10*3/uL (ref 150.0–400.0)
RBC: 4.06 Mil/uL — ABNORMAL LOW (ref 4.22–5.81)
RDW: 15.8 % — ABNORMAL HIGH (ref 11.5–15.5)
WBC: 7.2 10*3/uL (ref 4.0–10.5)

## 2022-12-17 NOTE — Assessment & Plan Note (Signed)
Lab Results  Component Value Date   HGBA1C 6.8 (A) 11/27/2022   Still good control on metformin 500 bid

## 2022-12-17 NOTE — Assessment & Plan Note (Signed)
Will recheck labs 

## 2022-12-17 NOTE — Assessment & Plan Note (Signed)
Urinary source Finished the hospital parenteral antibiotics and oral cipro No symptoms now Reviewed his sterile technique for self catherization

## 2022-12-17 NOTE — Progress Notes (Signed)
Subjective:    Patient ID: Frank Carlson, male    DOB: October 23, 1941, 82 y.o.   MRN: [id number]  HPI Here with wife for hospital follow up  Wife called EMS-- after he fell twice and couldn't stand straight Diagnosed with UTI/sepsis AKI--with decline in GFR which did improve back to mid 50's No clear fever  Treated with IV antibiotics Sent home on cipro--done with this  No cough or SOB Does do chronic intermittent caths--- 3-4 per day Is on catheter program---uses just once (clear plastic) No traumatic caths Not sure about urology follow up  Does check sugar daily 104 -120's  Current Outpatient Medications on File Prior to Visit  Medication Sig Dispense Refill   metFORMIN (GLUCOPHAGE) 500 MG tablet TAKE 1 TABLET TWICE DAILY WITH A MEAL 180 tablet 3   Multiple Vitamin (MULTIVITAMIN WITH MINERALS) TABS tablet Take 1 tablet by mouth every morning.      tamsulosin (FLOMAX) 0.4 MG CAPS capsule Take 0.8 mg by mouth daily.     TRUEplus Lancets 30G MISC USE TO TEST BLOOD SUGAR EVERY DAY 100 each 3   No current facility-administered medications on file prior to visit.    Allergies  Allergen Reactions   Bee Venom Other (See Comments)    Passed out    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   CONCUSSION WITH LOC OF 30 MINUTES OR LESS 11/01/2009   Diabetes mellitus without complication ([place])    TYPE 2   ED (erectile dysfunction)    HEARING LOSS, BILATERAL 11/01/2009   Wears hearing aids   Hyperlipidemia    JOINT STIFFNESS, HAND 10/28/2008   Pleural effusion 10/2019   Pneumonia    Type II or unspecified type diabetes mellitus with neurological manifestations, not stated as uncontrolled(250.60) 1/15   Type 2    Past Surgical History:  Procedure Laterality Date   CATARACT EXTRACTION Bilateral 07/2010   OD with IOL   COLONOSCOPY W/ POLYPECTOMY     INCISION / DRAINAGE HAND  / FINGER Right    INGUINAL HERNIA REPAIR Right 08/23/2015   Procedure: LAPAROSCOPIC RIGHT INGUINAL HERNIA REPAIR WITH MESH;  Surgeon: Ralene Ok, MD;  Location: [facility];  Service: General;  Laterality: Right;   INSERTION OF MESH Right 08/23/2015   Procedure: INSERTION OF MESH;  Surgeon: Ralene Ok, MD;  Location: [facility];  Service: General;  Laterality: Right;   IR THORACENTESIS ASP PLEURAL SPACE W/IMG GUIDE  10/15/2019   TONSILLECTOMY     VASECTOMY      Family History  Problem Relation Age of Onset   Alzheimer's disease Mother    Dementia Mother    Coronary artery disease Father    Heart disease Father    Coronary artery disease Other    Diabetes Other    Alzheimer's disease Sister    Dementia Brother    Alzheimer's disease Brother     Social History   Socioeconomic History   Marital status: Married    Spouse name: Lovey Newcomer   Number of children: 3   Years of education: 16   Highest education level: Not on file  Occupational History   Occupation: Therapist, occupational    Comment: Retired  Tobacco Use   Smoking status: Former    Packs/day: 1.50    Years: 5.00    Total pack years: 7.50    Types: Cigarettes    Start  date: 64    Quit date: 11/25/1958    Years since quitting: 64.1    Passive exposure: Never   Smokeless tobacco: Never  Vaping Use   Vaping Use: Never used  Substance and Sexual Activity   Alcohol use: No   Drug use: No   Sexual activity: Yes    Partners: Female  Other Topics Concern   Not on file  Social History Narrative   [employer]- Estate manager/land agent.    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    Married '64. 3 sons- '66, '68, '71; 5 grandchildren, 1 step g-dtr.       Has living will   Wife is health care POA--then son Mitzi Hansen   Would accept resuscitation attempts but no prolonged ventilatory support   Probably wouldn't want prolonged tube feeds   Social Determinants of Health   Financial Resource Strain: Low Risk   (09/22/2020)   Overall Financial Resource Strain (CARDIA)    Difficulty of Paying Living Expenses: Not hard at all  Food Insecurity: No Food Insecurity (12/11/2022)   Hunger Vital Sign    Worried About Running Out of Food in the Last Year: Never true    Ran Out of Food in the Last Year: Never true  Transportation Needs: No Transportation Needs (12/06/2022)   PRAPARE - Hydrologist (Medical): No    Lack of Transportation (Non-Medical): No  Physical Activity: Not on file  Stress: Not on file  Social Connections: Not on file  Intimate Partner Violence: Not At Risk (12/06/2022)   Humiliation, Afraid, Rape, and Kick questionnaire    Fear of Current or Ex-Partner: No    Emotionally Abused: No    Physically Abused: No    Sexually Abused: No   Review of Systems No N/V Appetite is good Sleeping fine     Objective:   Physical Exam Constitutional:      Appearance: Normal appearance.  Cardiovascular:     Rate and Rhythm: Normal rate and regular rhythm.     Heart sounds: No murmur heard.    No gallop.  Pulmonary:     Effort: Pulmonary effort is normal.     Breath sounds: Normal breath sounds. No wheezing or rales.  Abdominal:     Palpations: Abdomen is soft.     Tenderness: There is no abdominal tenderness.  Musculoskeletal:     Cervical back: Neck supple.     Right lower leg: No edema.     Left lower leg: No edema.  Lymphadenopathy:     Cervical: No cervical adenopathy.  Neurological:     Mental Status: He is alert.  Psychiatric:        Mood and Affect: Mood normal.        Behavior: Behavior normal.            Assessment & Plan:

## 2023-01-24 DIAGNOSIS — N130 Hydronephrosis with ureteropelvic junction obstruction: Secondary | ICD-10-CM | POA: Diagnosis not present

## 2023-01-24 DIAGNOSIS — R338 Other retention of urine: Secondary | ICD-10-CM | POA: Diagnosis not present

## 2023-01-25 DIAGNOSIS — R339 Retention of urine, unspecified: Secondary | ICD-10-CM | POA: Diagnosis not present

## 2023-02-07 DIAGNOSIS — E119 Type 2 diabetes mellitus without complications: Secondary | ICD-10-CM | POA: Diagnosis not present

## 2023-02-07 LAB — HM DIABETES EYE EXAM

## 2023-03-12 DIAGNOSIS — N3941 Urge incontinence: Secondary | ICD-10-CM | POA: Diagnosis not present

## 2023-03-12 DIAGNOSIS — N130 Hydronephrosis with ureteropelvic junction obstruction: Secondary | ICD-10-CM | POA: Diagnosis not present

## 2023-03-12 DIAGNOSIS — N200 Calculus of kidney: Secondary | ICD-10-CM | POA: Diagnosis not present

## 2023-03-12 DIAGNOSIS — R338 Other retention of urine: Secondary | ICD-10-CM | POA: Diagnosis not present

## 2023-04-30 DIAGNOSIS — R338 Other retention of urine: Secondary | ICD-10-CM | POA: Diagnosis not present

## 2023-04-30 DIAGNOSIS — N130 Hydronephrosis with ureteropelvic junction obstruction: Secondary | ICD-10-CM | POA: Diagnosis not present

## 2023-05-28 ENCOUNTER — Encounter: Payer: Medicare HMO | Admitting: Internal Medicine

## 2023-05-28 ENCOUNTER — Encounter: Payer: Self-pay | Admitting: Internal Medicine

## 2023-05-28 ENCOUNTER — Ambulatory Visit (INDEPENDENT_AMBULATORY_CARE_PROVIDER_SITE_OTHER): Payer: Medicare HMO | Admitting: Internal Medicine

## 2023-05-28 VITALS — BP 112/70 | HR 60 | Temp 97.9°F | Ht 72.5 in | Wt 181.0 lb

## 2023-05-28 DIAGNOSIS — N401 Enlarged prostate with lower urinary tract symptoms: Secondary | ICD-10-CM | POA: Diagnosis not present

## 2023-05-28 DIAGNOSIS — D6869 Other thrombophilia: Secondary | ICD-10-CM | POA: Insufficient documentation

## 2023-05-28 DIAGNOSIS — G3184 Mild cognitive impairment, so stated: Secondary | ICD-10-CM | POA: Diagnosis not present

## 2023-05-28 DIAGNOSIS — N1831 Chronic kidney disease, stage 3a: Secondary | ICD-10-CM | POA: Diagnosis not present

## 2023-05-28 DIAGNOSIS — I700 Atherosclerosis of aorta: Secondary | ICD-10-CM | POA: Diagnosis not present

## 2023-05-28 DIAGNOSIS — F0150 Vascular dementia without behavioral disturbance: Secondary | ICD-10-CM | POA: Insufficient documentation

## 2023-05-28 DIAGNOSIS — N138 Other obstructive and reflux uropathy: Secondary | ICD-10-CM | POA: Diagnosis not present

## 2023-05-28 DIAGNOSIS — E1142 Type 2 diabetes mellitus with diabetic polyneuropathy: Secondary | ICD-10-CM

## 2023-05-28 DIAGNOSIS — Z0000 Encounter for general adult medical examination without abnormal findings: Secondary | ICD-10-CM | POA: Diagnosis not present

## 2023-05-28 LAB — CBC
HCT: 29.5 % — ABNORMAL LOW (ref 39.0–52.0)
Hemoglobin: 9.4 g/dL — ABNORMAL LOW (ref 13.0–17.0)
MCHC: 32 g/dL (ref 30.0–36.0)
MCV: 83.6 fl (ref 78.0–100.0)
Platelets: 184 10*3/uL (ref 150.0–400.0)
RBC: 3.53 Mil/uL — ABNORMAL LOW (ref 4.22–5.81)
RDW: 19.7 % — ABNORMAL HIGH (ref 11.5–15.5)
WBC: 5 10*3/uL (ref 4.0–10.5)

## 2023-05-28 LAB — RENAL FUNCTION PANEL
Albumin: 3.6 g/dL (ref 3.5–5.2)
BUN: 27 mg/dL — ABNORMAL HIGH (ref 6–23)
CO2: 30 mEq/L (ref 19–32)
Calcium: 9.1 mg/dL (ref 8.4–10.5)
Chloride: 103 mEq/L (ref 96–112)
Creatinine, Ser: 2.26 mg/dL — ABNORMAL HIGH (ref 0.40–1.50)
GFR: 26.03 mL/min — ABNORMAL LOW (ref >60.00)
Glucose, Bld: 115 mg/dL — ABNORMAL HIGH (ref 70–99)
Phosphorus: 3.5 mg/dL (ref 2.3–4.6)
Potassium: 4.3 mEq/L (ref 3.5–5.1)
Sodium: 139 mEq/L (ref 135–145)

## 2023-05-28 LAB — VITAMIN B12: Vitamin B-12: 921 pg/mL — ABNORMAL HIGH (ref 211–911)

## 2023-05-28 LAB — VITAMIN D 25 HYDROXY (VIT D DEFICIENCY, FRACTURES): VITD: 35.62 ng/mL (ref 30.00–100.00)

## 2023-05-28 LAB — MICROALBUMIN / CREATININE URINE RATIO
Creatinine,U: 77.6 mg/dL
Microalb Creat Ratio: 39.6 mg/g — ABNORMAL HIGH (ref 0.0–30.0)
Microalb, Ur: 30.7 mg/dL — ABNORMAL HIGH (ref 0.0–1.9)

## 2023-05-28 LAB — HEPATIC FUNCTION PANEL
ALT: 7 U/L (ref 0–53)
AST: 8 U/L (ref 0–37)
Albumin: 3.6 g/dL (ref 3.5–5.2)
Alkaline Phosphatase: 45 U/L (ref 39–117)
Bilirubin, Direct: 0 mg/dL (ref 0.0–0.3)
Total Bilirubin: 0.3 mg/dL (ref 0.2–1.2)
Total Protein: 7.1 g/dL (ref 6.0–8.3)

## 2023-05-28 LAB — LIPID PANEL
Cholesterol: 200 mg/dL (ref 0–200)
HDL: 32.3 mg/dL — ABNORMAL LOW (ref >39.00)
LDL Cholesterol: 145 mg/dL — ABNORMAL HIGH (ref 0–99)
NonHDL: 167.69
Total CHOL/HDL Ratio: 6
Triglycerides: 111 mg/dL (ref 0.0–149.0)
VLDL: 22.2 mg/dL (ref 0.0–40.0)

## 2023-05-28 LAB — HM DIABETES FOOT EXAM

## 2023-05-28 LAB — HEMOGLOBIN A1C: Hgb A1c MFr Bld: 6.1 % (ref 4.6–6.5)

## 2023-05-28 LAB — TSH: TSH: 3.72 u[IU]/mL (ref 0.35–5.50)

## 2023-05-28 NOTE — Assessment & Plan Note (Signed)
Stable GFR over time No ACEI/ARB due to low BP and past orthostasis

## 2023-05-28 NOTE — Progress Notes (Signed)
Hearing Screening - Comments:: Has hearing aids. Wearing them today. Vision Screening - Comments:: March 2024

## 2023-05-28 NOTE — Addendum Note (Signed)
Addended by: Tillman Abide I on: 05/28/2023 01:00 PM   Modules accepted: Orders

## 2023-05-28 NOTE — Patient Instructions (Signed)
Please get an updated COVID and flu vaccine this fall I also recommend an RSV vaccine (one time only). I can see you in 6 months--unless your memory continues to worsen. Have your wife come with you next time.

## 2023-05-28 NOTE — Assessment & Plan Note (Signed)
Multiple ecchymoses on arms--none on trunk Probably not pathologic Will check CBC

## 2023-05-28 NOTE — Assessment & Plan Note (Signed)
No problems with ongoing retention since he does a daily cath On tamsulosin 0.8 mg daily as well

## 2023-05-28 NOTE — Progress Notes (Signed)
Subjective:    Patient ID: Frank Carlson, male    DOB: [birth year], 82 y.o.   MRN: [id number]  HPI Here for Medicare wellness visit and follow up of chronic health conditions Reviewed advanced directives Reviewed other doctors--Dr Merrie Roof doctor, Dr Herrick--urologyteeth okay Was hospitalized with UTI in January. No other hospitalizations or surgery May have had 1 fall this year--no injury Vision is okay Hearing aides do help Walks regularly with wife. Not going to gym now--[facility]. No depression or anhedonia No alcohol or tobacco Independent with instrumental ADLs Mild memory issues  Still self caths daily--once in the morning Usually gets about 300 ml Does have nocturia x 1 No excessive frequency during the day Checks sugars occasionally---thinks they are okay (doesn't remember the numbers)  Having abnormal sensation in calves and feet Still can feel the floor Not really painful and no sig balance issues (discussed using walking stick)  GFR 54--fairly stable  Does bruise really easily Just on forearms  No chest pain or palpitations No dizziness or syncope No edema No SOB  Current Outpatient Medications on File Prior to Visit  Medication Sig Dispense Refill   metFORMIN (GLUCOPHAGE) 500 MG tablet TAKE 1 TABLET TWICE DAILY WITH A MEAL 180 tablet 3   Multiple Vitamin (MULTIVITAMIN WITH MINERALS) TABS tablet Take 1 tablet by mouth every morning.      tamsulosin (FLOMAX) 0.4 MG CAPS capsule Take 0.8 mg by mouth daily.     TRUEplus Lancets 30G MISC USE TO TEST BLOOD SUGAR EVERY DAY 100 each 3   No current facility-administered medications on file prior to visit.    Allergies  Allergen Reactions   Bee Venom Other (See Comments)    Passed out    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   CONCUSSION WITH LOC OF 30 MINUTES OR LESS 11/01/2009    Diabetes mellitus without complication (HCC)    TYPE 2   ED (erectile dysfunction)    HEARING LOSS, BILATERAL 11/01/2009   Wears hearing aids   Hyperlipidemia    JOINT STIFFNESS, HAND 10/28/2008   Pleural effusion 10/2019   Pneumonia    Type II or unspecified type diabetes mellitus with neurological manifestations, not stated as uncontrolled(250.60) 1/15   Type 2    Past Surgical History:  Procedure Laterality Date   CATARACT EXTRACTION Bilateral 07/2010   OD with IOL   COLONOSCOPY W/ POLYPECTOMY     INCISION / DRAINAGE HAND / FINGER Right    INGUINAL HERNIA REPAIR Right 08/23/2015   Procedure: LAPAROSCOPIC RIGHT INGUINAL HERNIA REPAIR WITH MESH;  Surgeon: Axel Filler, MD;  Location: [facility];  Service: General;  Laterality: Right;   INSERTION OF MESH Right 08/23/2015   Procedure: INSERTION OF MESH;  Surgeon: Axel Filler, MD;  Location: [facility] OR;  Service: General;  Laterality: Right;   IR THORACENTESIS ASP PLEURAL SPACE W/IMG GUIDE  10/15/2019   TONSILLECTOMY     VASECTOMY      Family History  Problem Relation Age of Onset   Alzheimer's disease Mother    Dementia Mother    Coronary artery disease Father    Heart disease Father    Coronary artery disease Other    Diabetes Other    Alzheimer's disease Sister    Dementia Brother    Alzheimer's disease Brother     Social History   Socioeconomic  History   Marital status: Married    Spouse name: Andrey Campanile   Number of children: 3   Years of education: 16   Highest education level: Not on file  Occupational History   Occupation: Licensed conveyancer    Comment: Retired  Tobacco Use   Smoking status: Former    Current packs/day: 0.00    Average packs/day: 1.5 packs/day for 5.0 years (7.6 ttl pk-yrs)    Types: Cigarettes    Start date: 73    Quit date: 11/25/1958    Years since quitting: 64.5    Passive exposure: Never   Smokeless tobacco: Never  Vaping Use   Vaping status: Never Used  Substance and Sexual  Activity   Alcohol use: No   Drug use: No   Sexual activity: Yes    Partners: Female  Other Topics Concern   Not on file  Social History Narrative   [employer].    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    Married '64. 3 sons- '66, '68, '71; 5 grandchildren, 1 step g-dtr.       Has living will   Wife is health care POA--then son Greig Castilla   Would accept resuscitation attempts but no prolonged ventilatory support   Probably wouldn't want prolonged tube feeds   Social Determinants of Health   Financial Resource Strain: Low Risk  (09/22/2020)   Overall Financial Resource Strain (CARDIA)    Difficulty of Paying Living Expenses: Not hard at all  Food Insecurity: No Food Insecurity (12/11/2022)   Hunger Vital Sign    Worried About Running Out of Food in the Last Year: Never true    Ran Out of Food in the Last Year: Never true  Transportation Needs: No Transportation Needs (12/06/2022)   PRAPARE - Administrator, Civil Service (Medical): No    Lack of Transportation (Non-Medical): No  Physical Activity: Not on file  Stress: Not on file  Social Connections: Not on file  Intimate Partner Violence: Not At Risk (12/06/2022)   Humiliation, Afraid, Rape, and Kick questionnaire    Fear of Current or Ex-Partner: No    Emotionally Abused: No    Physically Abused: No    Sexually Abused: No   Review of Systems Appetite is gine Weight stable Sleeps well Wears seat belt Teeth are okay--not sure of last dental appt (discussed) No suspicious skin lesions No heartburn or dysphagia Bowels move okay--no blood No sgi back or joint pains    Objective:   Physical Exam Constitutional:      Appearance: Normal appearance.  HENT:     Mouth/Throat:     Pharynx: No oropharyngeal exudate or posterior oropharyngeal erythema.  Eyes:     Conjunctiva/sclera: Conjunctivae normal.     Pupils: Pupils are equal, round, and reactive to light.  Cardiovascular:      Rate and Rhythm: Normal rate and regular rhythm.     Pulses: Normal pulses.     Heart sounds: No murmur heard.    No gallop.  Pulmonary:     Effort: Pulmonary effort is normal.     Breath sounds: Normal breath sounds. No wheezing or rales.  Abdominal:     Palpations: Abdomen is soft.     Tenderness: There is no abdominal tenderness.  Musculoskeletal:     Cervical back: Neck supple.     Right lower leg: No edema.     Left lower leg: No edema.     Comments: No foot lesions  Scattered ecchymoses on forearms  Lymphadenopathy:     Cervical: No cervical adenopathy.  Skin:    Findings: No lesion or rash.  Neurological:     General: No focal deficit present.     Mental Status: He is alert and oriented to person, place, and time.     Comments: Repeated himself a few times Word naming 12/1 minute Recall 0/3 Decreased sensation in feet  Psychiatric:        Mood and Affect: Mood normal.        Behavior: Behavior normal.            Assessment & Plan:

## 2023-05-28 NOTE — Assessment & Plan Note (Signed)
Clear cognitive issues Will check labs Hopefully wife will come in next time

## 2023-05-28 NOTE — Assessment & Plan Note (Signed)
Neuropathy more noticeable but no pain Still on metformin 500mg  bid

## 2023-05-28 NOTE — Assessment & Plan Note (Signed)
On imaging Given cognitive issues--will hold off on statin (and age)

## 2023-05-28 NOTE — Assessment & Plan Note (Signed)
I have personally reviewed the Medicare Annual Wellness questionnaire and have noted 1. The patient's medical and social history 2. Their use of alcohol, tobacco or illicit drugs 3. Their current medications and supplements 4. The patient's functional ability including ADL's, fall risks, home safety risks and hearing or visual             impairment. 5. Diet and physical activities 6. Evidence for depression or mood disorders  The patients weight, height, BMI and visual acuity have been recorded in the chart I have made referrals, counseling and provided education to the patient based review of the above and I have provided the pt with a written personalized care plan for preventive services.  I have provided you with a copy of your personalized plan for preventive services. Please take the time to review along with your updated medication list.  Done with cancer screening As[facility] Recommended updated COVID/flu this fall--and RSV

## 2023-05-29 ENCOUNTER — Other Ambulatory Visit: Payer: Medicare HMO

## 2023-05-29 ENCOUNTER — Other Ambulatory Visit: Payer: Self-pay | Admitting: Internal Medicine

## 2023-05-29 DIAGNOSIS — N179 Acute kidney failure, unspecified: Secondary | ICD-10-CM

## 2023-05-29 LAB — EXTRA SPECIMEN

## 2023-05-29 LAB — PARATHYROID HORMONE, INTACT (NO CA)

## 2023-06-03 ENCOUNTER — Ambulatory Visit
Admission: RE | Admit: 2023-06-03 | Discharge: 2023-06-03 | Disposition: A | Discharge status: Home / Self Care | Payer: Medicare HMO | Source: Ambulatory Visit | Attending: Internal Medicine | Admitting: Internal Medicine

## 2023-06-03 DIAGNOSIS — N400 Enlarged prostate without lower urinary tract symptoms: Secondary | ICD-10-CM | POA: Diagnosis not present

## 2023-06-03 DIAGNOSIS — N1330 Unspecified hydronephrosis: Secondary | ICD-10-CM | POA: Diagnosis not present

## 2023-06-03 DIAGNOSIS — N179 Acute kidney failure, unspecified: Secondary | ICD-10-CM

## 2023-06-04 ENCOUNTER — Other Ambulatory Visit: Payer: Self-pay | Admitting: Internal Medicine

## 2023-06-04 DIAGNOSIS — N184 Chronic kidney disease, stage 4 (severe): Secondary | ICD-10-CM

## 2023-06-04 NOTE — Progress Notes (Signed)
noted 

## 2023-06-05 ENCOUNTER — Other Ambulatory Visit (INDEPENDENT_AMBULATORY_CARE_PROVIDER_SITE_OTHER): Payer: Medicare HMO

## 2023-06-05 DIAGNOSIS — N184 Chronic kidney disease, stage 4 (severe): Secondary | ICD-10-CM

## 2023-06-05 LAB — RENAL FUNCTION PANEL
Albumin: 3.6 g/dL (ref 3.5–5.2)
BUN: 36 mg/dL — ABNORMAL HIGH (ref 6–23)
CO2: 24 mEq/L (ref 19–32)
Calcium: 9.3 mg/dL (ref 8.4–10.5)
Chloride: 103 mEq/L (ref 96–112)
Creatinine, Ser: 2.51 mg/dL — ABNORMAL HIGH (ref 0.40–1.50)
GFR: 22.95 mL/min — ABNORMAL LOW (ref >60.00)
Glucose, Bld: 172 mg/dL — ABNORMAL HIGH (ref 70–99)
Phosphorus: 4.1 mg/dL (ref 2.3–4.6)
Potassium: 4.4 mEq/L (ref 3.5–5.1)
Sodium: 138 mEq/L (ref 135–145)

## 2023-06-07 ENCOUNTER — Telehealth: Payer: Self-pay | Admitting: Internal Medicine

## 2023-06-07 NOTE — Telephone Encounter (Signed)
Patient wife called in and stated that she called over to Dr. Jasmine Awe office but they weren't understanding what she needed. She stated that she needs someone to call over and speak with them in regards to [place]. Thank you!

## 2023-06-07 NOTE — Telephone Encounter (Signed)
I forwarded the US Renal report to Dr Marlou Porch with a note that he needs an OV with him to go over possible treatment options.

## 2023-06-09 ENCOUNTER — Other Ambulatory Visit: Payer: Self-pay | Admitting: Internal Medicine

## 2023-06-09 DIAGNOSIS — N184 Chronic kidney disease, stage 4 (severe): Secondary | ICD-10-CM

## 2023-06-09 NOTE — Progress Notes (Signed)
Consult placed

## 2023-06-30 ENCOUNTER — Other Ambulatory Visit: Payer: Self-pay | Admitting: Internal Medicine

## 2023-07-04 DIAGNOSIS — E785 Hyperlipidemia, unspecified: Secondary | ICD-10-CM | POA: Diagnosis not present

## 2023-07-04 DIAGNOSIS — E1122 Type 2 diabetes mellitus with diabetic chronic kidney disease: Secondary | ICD-10-CM | POA: Diagnosis not present

## 2023-07-04 DIAGNOSIS — H9193 Unspecified hearing loss, bilateral: Secondary | ICD-10-CM | POA: Diagnosis not present

## 2023-07-04 DIAGNOSIS — N189 Chronic kidney disease, unspecified: Secondary | ICD-10-CM | POA: Diagnosis not present

## 2023-07-04 DIAGNOSIS — N400 Enlarged prostate without lower urinary tract symptoms: Secondary | ICD-10-CM | POA: Diagnosis not present

## 2023-07-04 DIAGNOSIS — N1831 Chronic kidney disease, stage 3a: Secondary | ICD-10-CM | POA: Diagnosis not present

## 2023-07-04 DIAGNOSIS — N179 Acute kidney failure, unspecified: Secondary | ICD-10-CM | POA: Diagnosis not present

## 2023-07-08 LAB — LAB REPORT - SCANNED
Albumin, Urine POC: 35.3
Creatinine, POC: 44.9 mg/dL
EGFR: 26
Microalb Creat Ratio: 79

## 2023-07-17 ENCOUNTER — Other Ambulatory Visit (HOSPITAL_COMMUNITY): Payer: Self-pay

## 2023-07-17 ENCOUNTER — Telehealth: Payer: Self-pay | Admitting: Pharmacy Technician

## 2023-07-17 NOTE — Telephone Encounter (Signed)
Pharmacy Patient Advocate Encounter   Received notification from CoverMyMeds that prior authorization for metFORMIN HCl 500MG  tablets is required/requested.   Insurance verification completed.   The patient is insured through [place] .   Per test claim: PA required; PA started via CoverMyMeds. KEY [id number] . Waiting for clinical questions to populate.

## 2023-07-18 NOTE — Telephone Encounter (Signed)
 Clinical questions answered and PA submitted

## 2023-07-19 NOTE — Telephone Encounter (Signed)
Pharmacy Patient Advocate Encounter  Received notification from [facility] that Prior Authorization for metFORMIN HCl 500MG  tablets  has been APPROVED from 07/18/23 to 11/12/23   PA #/Case ID/Reference #:  [id number]

## 2023-09-15 ENCOUNTER — Emergency Department (HOSPITAL_COMMUNITY): Payer: Medicare HMO

## 2023-09-15 ENCOUNTER — Observation Stay (HOSPITAL_COMMUNITY): Payer: Medicare HMO

## 2023-09-15 ENCOUNTER — Encounter (HOSPITAL_COMMUNITY): Payer: Self-pay

## 2023-09-15 ENCOUNTER — Other Ambulatory Visit: Payer: Self-pay

## 2023-09-15 ENCOUNTER — Inpatient Hospital Stay (HOSPITAL_COMMUNITY)
Admission: EM | Admit: 2023-09-15 | Discharge: 2023-09-19 | DRG: 683 | Disposition: A | Discharge status: Home Health | Payer: Medicare HMO | Attending: Internal Medicine | Admitting: Internal Medicine

## 2023-09-15 ENCOUNTER — Observation Stay (HOSPITAL_BASED_OUTPATIENT_CLINIC_OR_DEPARTMENT_OTHER): Payer: Medicare HMO

## 2023-09-15 DIAGNOSIS — W01190A Fall on same level from slipping, tripping and stumbling with subsequent striking against furniture, initial encounter: Secondary | ICD-10-CM | POA: Diagnosis present

## 2023-09-15 DIAGNOSIS — S0101XA Laceration without foreign body of scalp, initial encounter: Secondary | ICD-10-CM | POA: Diagnosis present

## 2023-09-15 DIAGNOSIS — Z860100 Personal history of colon polyps, unspecified: Secondary | ICD-10-CM | POA: Diagnosis not present

## 2023-09-15 DIAGNOSIS — Z7984 Long term (current) use of oral hypoglycemic drugs: Secondary | ICD-10-CM

## 2023-09-15 DIAGNOSIS — N390 Urinary tract infection, site not specified: Secondary | ICD-10-CM | POA: Diagnosis present

## 2023-09-15 DIAGNOSIS — E1149 Type 2 diabetes mellitus with other diabetic neurological complication: Secondary | ICD-10-CM | POA: Diagnosis present

## 2023-09-15 DIAGNOSIS — N401 Enlarged prostate with lower urinary tract symptoms: Secondary | ICD-10-CM | POA: Diagnosis present

## 2023-09-15 DIAGNOSIS — Z974 Presence of external hearing-aid: Secondary | ICD-10-CM | POA: Diagnosis not present

## 2023-09-15 DIAGNOSIS — W19XXXA Unspecified fall, initial encounter: Secondary | ICD-10-CM | POA: Diagnosis not present

## 2023-09-15 DIAGNOSIS — Z8673 Personal history of transient ischemic attack (TIA), and cerebral infarction without residual deficits: Secondary | ICD-10-CM | POA: Diagnosis not present

## 2023-09-15 DIAGNOSIS — I959 Hypotension, unspecified: Secondary | ICD-10-CM | POA: Diagnosis not present

## 2023-09-15 DIAGNOSIS — B952 Enterococcus as the cause of diseases classified elsewhere: Secondary | ICD-10-CM | POA: Diagnosis present

## 2023-09-15 DIAGNOSIS — I951 Orthostatic hypotension: Secondary | ICD-10-CM | POA: Diagnosis present

## 2023-09-15 DIAGNOSIS — N179 Acute kidney failure, unspecified: Principal | ICD-10-CM | POA: Diagnosis present

## 2023-09-15 DIAGNOSIS — Z961 Presence of intraocular lens: Secondary | ICD-10-CM | POA: Diagnosis present

## 2023-09-15 DIAGNOSIS — E1122 Type 2 diabetes mellitus with diabetic chronic kidney disease: Secondary | ICD-10-CM | POA: Diagnosis present

## 2023-09-15 DIAGNOSIS — Z87891 Personal history of nicotine dependence: Secondary | ICD-10-CM

## 2023-09-15 DIAGNOSIS — R2689 Other abnormalities of gait and mobility: Secondary | ICD-10-CM

## 2023-09-15 DIAGNOSIS — N1330 Unspecified hydronephrosis: Secondary | ICD-10-CM | POA: Diagnosis not present

## 2023-09-15 DIAGNOSIS — Z9842 Cataract extraction status, left eye: Secondary | ICD-10-CM | POA: Diagnosis not present

## 2023-09-15 DIAGNOSIS — N189 Chronic kidney disease, unspecified: Secondary | ICD-10-CM | POA: Diagnosis not present

## 2023-09-15 DIAGNOSIS — Z9841 Cataract extraction status, right eye: Secondary | ICD-10-CM

## 2023-09-15 DIAGNOSIS — G930 Cerebral cysts: Secondary | ICD-10-CM | POA: Diagnosis not present

## 2023-09-15 DIAGNOSIS — N138 Other obstructive and reflux uropathy: Secondary | ICD-10-CM | POA: Diagnosis present

## 2023-09-15 DIAGNOSIS — E785 Hyperlipidemia, unspecified: Secondary | ICD-10-CM | POA: Diagnosis present

## 2023-09-15 DIAGNOSIS — M47812 Spondylosis without myelopathy or radiculopathy, cervical region: Secondary | ICD-10-CM | POA: Diagnosis not present

## 2023-09-15 DIAGNOSIS — Z23 Encounter for immunization: Secondary | ICD-10-CM

## 2023-09-15 DIAGNOSIS — N281 Cyst of kidney, acquired: Secondary | ICD-10-CM | POA: Diagnosis not present

## 2023-09-15 DIAGNOSIS — R29818 Other symptoms and signs involving the nervous system: Secondary | ICD-10-CM | POA: Diagnosis not present

## 2023-09-15 DIAGNOSIS — R4182 Altered mental status, unspecified: Secondary | ICD-10-CM | POA: Diagnosis not present

## 2023-09-15 DIAGNOSIS — H9193 Unspecified hearing loss, bilateral: Secondary | ICD-10-CM | POA: Diagnosis present

## 2023-09-15 DIAGNOSIS — D649 Anemia, unspecified: Secondary | ICD-10-CM

## 2023-09-15 DIAGNOSIS — R42 Dizziness and giddiness: Secondary | ICD-10-CM | POA: Diagnosis not present

## 2023-09-15 DIAGNOSIS — Z91030 Bee allergy status: Secondary | ICD-10-CM

## 2023-09-15 DIAGNOSIS — R531 Weakness: Secondary | ICD-10-CM | POA: Diagnosis not present

## 2023-09-15 DIAGNOSIS — D631 Anemia in chronic kidney disease: Secondary | ICD-10-CM | POA: Diagnosis present

## 2023-09-15 DIAGNOSIS — Z833 Family history of diabetes mellitus: Secondary | ICD-10-CM

## 2023-09-15 DIAGNOSIS — N184 Chronic kidney disease, stage 4 (severe): Secondary | ICD-10-CM | POA: Diagnosis present

## 2023-09-15 DIAGNOSIS — I129 Hypertensive chronic kidney disease with stage 1 through stage 4 chronic kidney disease, or unspecified chronic kidney disease: Secondary | ICD-10-CM | POA: Diagnosis not present

## 2023-09-15 DIAGNOSIS — S0990XA Unspecified injury of head, initial encounter: Secondary | ICD-10-CM | POA: Diagnosis not present

## 2023-09-15 DIAGNOSIS — Z79899 Other long term (current) drug therapy: Secondary | ICD-10-CM | POA: Diagnosis not present

## 2023-09-15 DIAGNOSIS — F05 Delirium due to known physiological condition: Secondary | ICD-10-CM | POA: Diagnosis not present

## 2023-09-15 DIAGNOSIS — I6782 Cerebral ischemia: Secondary | ICD-10-CM | POA: Diagnosis not present

## 2023-09-15 DIAGNOSIS — R55 Syncope and collapse: Secondary | ICD-10-CM

## 2023-09-15 DIAGNOSIS — N1832 Chronic kidney disease, stage 3b: Principal | ICD-10-CM | POA: Diagnosis present

## 2023-09-15 DIAGNOSIS — S199XXA Unspecified injury of neck, initial encounter: Secondary | ICD-10-CM | POA: Diagnosis not present

## 2023-09-15 DIAGNOSIS — R001 Bradycardia, unspecified: Secondary | ICD-10-CM | POA: Diagnosis not present

## 2023-09-15 DIAGNOSIS — N19 Unspecified kidney failure: Secondary | ICD-10-CM | POA: Diagnosis not present

## 2023-09-15 LAB — IRON AND TIBC
Iron: 15 ug/dL — ABNORMAL LOW (ref 45–182)
Saturation Ratios: 6 % — ABNORMAL LOW (ref 17.9–39.5)
TIBC: 263 ug/dL (ref 250–450)
UIBC: 248 ug/dL

## 2023-09-15 LAB — PROTIME-INR
INR: 1.1 (ref 0.8–1.2)
Prothrombin Time: 14.6 s (ref 11.4–15.2)

## 2023-09-15 LAB — COMPREHENSIVE METABOLIC PANEL
ALT: 9 U/L (ref 0–44)
AST: 9 U/L — ABNORMAL LOW (ref 15–41)
Albumin: 3 g/dL — ABNORMAL LOW (ref 3.5–5.0)
Alkaline Phosphatase: 46 U/L (ref 38–126)
Anion gap: 12 (ref 5–15)
BUN: 49 mg/dL — ABNORMAL HIGH (ref 8–23)
CO2: 21 mmol/L — ABNORMAL LOW (ref 22–32)
Calcium: 9.3 mg/dL (ref 8.9–10.3)
Chloride: 107 mmol/L (ref 98–111)
Creatinine, Ser: 3.78 mg/dL — ABNORMAL HIGH (ref 0.61–1.24)
GFR, Estimated: 15 mL/min — ABNORMAL LOW (ref >60)
Glucose, Bld: 156 mg/dL — ABNORMAL HIGH (ref 70–99)
Potassium: 4.8 mmol/L (ref 3.5–5.1)
Sodium: 140 mmol/L (ref 135–145)
Total Bilirubin: 0.4 mg/dL (ref 0.3–1.2)
Total Protein: 7.9 g/dL (ref 6.5–8.1)

## 2023-09-15 LAB — CBC
HCT: 29.2 % — ABNORMAL LOW (ref 39.0–52.0)
Hemoglobin: 9.2 g/dL — ABNORMAL LOW (ref 13.0–17.0)
MCH: 26.9 pg (ref 26.0–34.0)
MCHC: 31.5 g/dL (ref 30.0–36.0)
MCV: 85.4 fL (ref 80.0–100.0)
Platelets: 271 10*3/uL (ref 150–400)
RBC: 3.42 MIL/uL — ABNORMAL LOW (ref 4.22–5.81)
RDW: 15.4 % (ref 11.5–15.5)
WBC: 6 10*3/uL (ref 4.0–10.5)
nRBC: 0 % (ref 0.0–0.2)

## 2023-09-15 LAB — VITAMIN B12: Vitamin B-12: 1307 pg/mL — ABNORMAL HIGH (ref 180–914)

## 2023-09-15 LAB — CBC WITH DIFFERENTIAL/PLATELET
Abs Immature Granulocytes: 0.03 10*3/uL (ref 0.00–0.07)
Basophils Absolute: 0 10*3/uL (ref 0.0–0.1)
Basophils Relative: 0 %
Eosinophils Absolute: 0 10*3/uL (ref 0.0–0.5)
Eosinophils Relative: 0 %
HCT: 28.9 % — ABNORMAL LOW (ref 39.0–52.0)
Hemoglobin: 9 g/dL — ABNORMAL LOW (ref 13.0–17.0)
Immature Granulocytes: 0 %
Lymphocytes Relative: 14 %
Lymphs Abs: 1.1 10*3/uL (ref 0.7–4.0)
MCH: 26.7 pg (ref 26.0–34.0)
MCHC: 31.1 g/dL (ref 30.0–36.0)
MCV: 85.8 fL (ref 80.0–100.0)
Monocytes Absolute: 0.8 10*3/uL (ref 0.1–1.0)
Monocytes Relative: 11 %
Neutro Abs: 5.6 10*3/uL (ref 1.7–7.7)
Neutrophils Relative %: 75 %
Platelets: 270 10*3/uL (ref 150–400)
RBC: 3.37 MIL/uL — ABNORMAL LOW (ref 4.22–5.81)
RDW: 15.5 % (ref 11.5–15.5)
WBC: 7.6 10*3/uL (ref 4.0–10.5)
nRBC: 0 % (ref 0.0–0.2)

## 2023-09-15 LAB — URINALYSIS, W/ REFLEX TO CULTURE (INFECTION SUSPECTED)
Bacteria, UA: NONE SEEN
Bilirubin Urine: NEGATIVE
Glucose, UA: NEGATIVE mg/dL
Ketones, ur: NEGATIVE mg/dL
Nitrite: NEGATIVE
Protein, ur: 100 mg/dL — AB
Specific Gravity, Urine: 1.009 (ref 1.005–1.030)
WBC, UA: >50 WBC/hpf (ref 0–5)
pH: 6 (ref 5.0–8.0)

## 2023-09-15 LAB — ECHOCARDIOGRAM COMPLETE
Area-P 1/2: 3.12 cm2
Calc EF: 55.9 %
Height: 72 in
S' Lateral: 2.9 cm
Single Plane A2C EF: 47.3 %
Single Plane A4C EF: 59.7 %
Weight: 2751.34 [oz_av]

## 2023-09-15 LAB — TSH: TSH: 2.538 u[IU]/mL (ref 0.350–4.500)

## 2023-09-15 LAB — MAGNESIUM: Magnesium: 2.1 mg/dL (ref 1.7–2.4)

## 2023-09-15 LAB — FOLATE: Folate: 38.1 ng/mL (ref >5.9)

## 2023-09-15 LAB — CREATININE, URINE, RANDOM: Creatinine, Urine: 67 mg/dL

## 2023-09-15 LAB — CREATININE, SERUM
Creatinine, Ser: 3.84 mg/dL — ABNORMAL HIGH (ref 0.61–1.24)
GFR, Estimated: 15 mL/min — ABNORMAL LOW (ref >60)

## 2023-09-15 LAB — GLUCOSE, CAPILLARY
Glucose-Capillary: 142 mg/dL — ABNORMAL HIGH (ref 70–99)
Glucose-Capillary: 148 mg/dL — ABNORMAL HIGH (ref 70–99)

## 2023-09-15 LAB — TROPONIN I (HIGH SENSITIVITY)
Troponin I (High Sensitivity): 9 ng/L (ref <18)
Troponin I (High Sensitivity): 9 ng/L (ref <18)

## 2023-09-15 LAB — SODIUM, URINE, RANDOM: Sodium, Ur: 75 mmol/L

## 2023-09-15 LAB — CK: Total CK: 48 U/L — ABNORMAL LOW (ref 49–397)

## 2023-09-15 LAB — FERRITIN: Ferritin: 158 ng/mL (ref 24–336)

## 2023-09-15 MED ORDER — SODIUM CHLORIDE 0.9 % IV SOLN
1.0000 g | INTRAVENOUS | Status: DC
  Administered 2023-09-16 – 2023-09-17 (×2): 1 g via INTRAVENOUS
  Filled 2023-09-15 (×2): qty 10

## 2023-09-15 MED ORDER — CEFTRIAXONE SODIUM 1 G IJ SOLR
1.0000 g | Freq: Once | INTRAMUSCULAR | Status: AC
  Administered 2023-09-15: 1 g via INTRAVENOUS
  Filled 2023-09-15: qty 10

## 2023-09-15 MED ORDER — TETANUS-DIPHTH-ACELL PERTUSSIS 5-2.5-18.5 LF-MCG/0.5 IM SUSY
0.5000 mL | PREFILLED_SYRINGE | Freq: Once | INTRAMUSCULAR | Status: AC
  Administered 2023-09-15: 0.5 mL via INTRAMUSCULAR
  Filled 2023-09-15: qty 0.5

## 2023-09-15 MED ORDER — INSULIN ASPART 100 UNIT/ML IJ SOLN
0.0000 [IU] | Freq: Three times a day (TID) | INTRAMUSCULAR | Status: DC
  Administered 2023-09-17 (×2): 1 [IU] via SUBCUTANEOUS
  Administered 2023-09-18: 2 [IU] via SUBCUTANEOUS

## 2023-09-15 MED ORDER — ACETAMINOPHEN 325 MG PO TABS: 650.0000 mg | ORAL_TABLET | Freq: Four times a day (QID) | ORAL | Status: DC | PRN

## 2023-09-15 MED ORDER — ONDANSETRON HCL 4 MG PO TABS: 4.0000 mg | ORAL_TABLET | Freq: Four times a day (QID) | ORAL | Status: DC | PRN

## 2023-09-15 MED ORDER — SODIUM CHLORIDE 0.9 % IV BOLUS
1000.0000 mL | Freq: Once | INTRAVENOUS | Status: AC
  Administered 2023-09-15: 1000 mL via INTRAVENOUS

## 2023-09-15 MED ORDER — ACETAMINOPHEN 500 MG PO TABS
1000.0000 mg | ORAL_TABLET | Freq: Once | ORAL | Status: AC
  Administered 2023-09-15: 1000 mg via ORAL
  Filled 2023-09-15: qty 2

## 2023-09-15 MED ORDER — ACETAMINOPHEN 650 MG RE SUPP: 650.0000 mg | Freq: Four times a day (QID) | RECTAL | Status: DC | PRN

## 2023-09-15 MED ORDER — ORAL CARE MOUTH RINSE: 15.0000 mL | OROMUCOSAL | Status: DC | PRN

## 2023-09-15 MED ORDER — SODIUM CHLORIDE 0.9 % IV SOLN: INTRAVENOUS | Status: AC

## 2023-09-15 MED ORDER — HEPARIN SODIUM (PORCINE) 5000 UNIT/ML IJ SOLN
5000.0000 [IU] | Freq: Three times a day (TID) | INTRAMUSCULAR | Status: DC
Start: 2023-09-15 — End: 2023-09-19
  Administered 2023-09-15 – 2023-09-19 (×10): 5000 [IU] via SUBCUTANEOUS
  Filled 2023-09-15 (×10): qty 1

## 2023-09-15 MED ORDER — ONDANSETRON HCL 4 MG/2ML IJ SOLN: 4.0000 mg | Freq: Four times a day (QID) | INTRAMUSCULAR | Status: DC | PRN

## 2023-09-15 NOTE — Assessment & Plan Note (Addendum)
A1C of 6.1 in July 2024 Hold metformin and d/c due to CKD  SSI and accuchecks QAC/HS

## 2023-09-15 NOTE — ED Notes (Signed)
Pt LSW 09/14/23 AT 2100

## 2023-09-15 NOTE — Assessment & Plan Note (Addendum)
Urology consulted-recommended to place foley for now  Hx of self cath  Hold flomax for now until orthostatics done

## 2023-09-15 NOTE — ED Notes (Signed)
ED TO INPATIENT HANDOFF REPORT  ED Nurse Name and Phone #: Armond Hang [phone]  S Name/Age/Gender Frank Carlson 82 y.o. male Room/Bed: 029C/029C  Code Status   Code Status: Prior  Home/SNF/Other Home Patient oriented to: self Is this baseline? No   Triage Complete: Triage complete  Chief Complaint Acute renal failure superimposed on stage 3b chronic kidney disease (HCC) [N17.9, N18.32]  Triage Note Pt BIB by EMS from home with a fall. Pt got dizzy and lost balance. Hit head on counter and has small laceration. Pt was dizzy when he woke up. Denies CP and SOB. Denies pain. Pt repeating things and is A&Ox2. Baseline is A&OX4. Pt is still off balance with EMS   Allergies Allergies  Allergen Reactions   Bee Venom Other (See Comments)    Passed out    Level of Care/Admitting Diagnosis ED Disposition     ED Disposition  Admit   Condition  --   Comment  Hospital Area: [facility] [[id number]]  Level of Care: Telemetry Medical [104]  May place patient in observation at [facility] or [facility] if equivalent level of care is available:: No  Covid Evaluation: Asymptomatic - no recent exposure (last 10 days) testing not required  Diagnosis: Acute renal failure superimposed on stage 3b chronic kidney disease [facility]) [[id number]]  Admitting Physician: Orland Mustard [[id number]]  Attending Physician: Alton Revere          B Medical/Surgery History 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   CONCUSSION WITH LOC OF 30 MINUTES OR LESS 11/01/2009   Diabetes mellitus without complication (HCC)    TYPE 2   ED (erectile dysfunction)    HEARING LOSS, BILATERAL 11/01/2009   Wears hearing aids   Hyperlipidemia    JOINT STIFFNESS, HAND 10/28/2008   Pleural effusion 10/2019   Pneumonia    Type II or unspecified type diabetes mellitus with neurological  manifestations, not stated as uncontrolled(250.60) 1/15   Type 2   Past Surgical History:  Procedure Laterality Date   CATARACT EXTRACTION Bilateral 07/2010   OD with IOL   COLONOSCOPY W/ POLYPECTOMY     INCISION / DRAINAGE HAND / FINGER Right    INGUINAL HERNIA REPAIR Right 08/23/2015   Procedure: LAPAROSCOPIC RIGHT INGUINAL HERNIA REPAIR WITH MESH;  Surgeon: Axel Filler, MD;  Location: [facility];  Service: General;  Laterality: Right;   INSERTION OF MESH Right 08/23/2015   Procedure: INSERTION OF MESH;  Surgeon: Axel Filler, MD;  Location: [facility] OR;  Service: General;  Laterality: Right;   IR THORACENTESIS ASP PLEURAL SPACE W/IMG GUIDE  10/15/2019   TONSILLECTOMY     VASECTOMY       A IV Location/Drains/Wounds Patient Lines/Drains/Airways Status     Active Line/Drains/Airways     Name Placement date Placement time Site Days   Peripheral IV 09/15/23 20 G Left Antecubital 09/15/23  1000  Antecubital  less than 1            Intake/Output Last 24 hours  Intake/Output Summary (Last 24 hours) at 09/15/2023 1206 Last data filed at 09/15/2023 1025 Gross per 24 hour  Intake 1000 ml  Output --  Net 1000 ml    Labs/Imaging Results for orders placed or performed during the hospital encounter of 09/15/23 (from the past 48 hour(s))  Comprehensive metabolic panel     Status: Abnormal   Collection  Time: 09/15/23  8:40 AM  Result Value Ref Range   Sodium 140 135 - 145 mmol/L   Potassium 4.8 3.5 - 5.1 mmol/L   Chloride 107 98 - 111 mmol/L   CO2 21 (L) 22 - 32 mmol/L   Glucose, Bld 156 (H) 70 - 99 mg/dL    Comment: Glucose reference range applies only to samples taken after fasting for at least 8 hours.   BUN 49 (H) 8 - 23 mg/dL   Creatinine, Ser 1.61 (H) 0.61 - 1.24 mg/dL   Calcium 9.3 8.9 - 09.6 mg/dL   Total Protein 7.9 6.5 - 8.1 g/dL   Albumin 3.0 (L) 3.5 - 5.0 g/dL   AST 9 (L) 15 - 41 U/L   ALT 9 0 - 44 U/L   Alkaline Phosphatase 46 38 - 126 U/L   Total Bilirubin 0.4  0.3 - 1.2 mg/dL   GFR, Estimated 15 (L) >60 mL/min    Comment: (NOTE) Calculated using the CKD-EPI Creatinine Equation (2021)    Anion gap 12 5 - 15    Comment: Performed at [facility], [address], [place], [place] [address]  CBC with Differential     Status: Abnormal   Collection Time: 09/15/23  8:40 AM  Result Value Ref Range   WBC 7.6 4.0 - 10.5 K/uL   RBC 3.37 (L) 4.22 - 5.81 MIL/uL   Hemoglobin 9.0 (L) 13.0 - 17.0 g/dL   HCT 98.1 (L) 19.1 - 47.8 %   MCV 85.8 80.0 - 100.0 fL   MCH 26.7 26.0 - 34.0 pg   MCHC 31.1 30.0 - 36.0 g/dL   RDW 29.5 62.1 - 30.8 %   Platelets 270 150 - 400 K/uL   nRBC 0.0 0.0 - 0.2 %   Neutrophils Relative % 75 %   Neutro Abs 5.6 1.7 - 7.7 K/uL   Lymphocytes Relative 14 %   Lymphs Abs 1.1 0.7 - 4.0 K/uL   Monocytes Relative 11 %   Monocytes Absolute 0.8 0.1 - 1.0 K/uL   Eosinophils Relative 0 %   Eosinophils Absolute 0.0 0.0 - 0.5 K/uL   Basophils Relative 0 %   Basophils Absolute 0.0 0.0 - 0.1 K/uL   Immature Granulocytes 0 %   Abs Immature Granulocytes 0.03 0.00 - 0.07 K/uL    Comment: Performed at [facility], [address], [place], [place] [address]  Protime-INR     Status: None   Collection Time: 09/15/23  8:40 AM  Result Value Ref Range   Prothrombin Time 14.6 11.4 - 15.2 seconds   INR 1.1 0.8 - 1.2    Comment: (NOTE) INR goal varies based on device and disease states. Performed at [facility], [address], [place], [place] [address]   Troponin I (High Sensitivity)     Status: None   Collection Time: 09/15/23  8:40 AM  Result Value Ref Range   Troponin I (High Sensitivity) 9 <18 ng/L    Comment: (NOTE) Elevated high sensitivity troponin I (hsTnI) values and significant  changes across serial measurements may suggest ACS but many other  chronic and acute conditions are known to elevate hsTnI results.  Refer to the "Links" section for chest pain algorithms and additional  guidance. Performed at [facility], [address], [place], [place] [address]   Urinalysis, w/ Reflex to Culture (Infection Suspected) -Urine, Clean Catch     Status: Abnormal   Collection Time: 09/15/23 10:15  AM  Result Value Ref Range   Specimen Source URINE, CLEAN CATCH    Color, Urine YELLOW YELLOW   APPearance TURBID (A) CLEAR   Specific Gravity, Urine 1.009 1.005 - 1.030   pH 6.0 5.0 - 8.0   Glucose, UA NEGATIVE NEGATIVE mg/dL   Hgb urine dipstick SMALL (A) NEGATIVE   Bilirubin Urine NEGATIVE NEGATIVE   Ketones, ur NEGATIVE NEGATIVE mg/dL   Protein, ur 161 (A) NEGATIVE mg/dL   Nitrite NEGATIVE NEGATIVE   Leukocytes,Ua MODERATE (A) NEGATIVE   RBC / HPF 21-50 0 - 5 RBC/hpf   WBC, UA >50 0 - 5 WBC/hpf    Comment:        Reflex urine culture not performed if WBC <=10, OR if Squamous epithelial cells >5. If Squamous epithelial cells >5 suggest recollection.    Bacteria, UA NONE SEEN NONE SEEN   Squamous Epithelial / HPF 0-5 0 - 5 /HPF   WBC Clumps PRESENT     Comment: Performed at [facility], [address] [place], [place] [address]  Troponin I (High Sensitivity)     Status: None   Collection Time: 09/15/23 10:32 AM  Result Value Ref Range   Troponin I (High Sensitivity) 9 <18 ng/L    Comment: (NOTE) Elevated high sensitivity troponin I (hsTnI) values and significant  changes across serial measurements may suggest ACS but many other  chronic and acute conditions are known to elevate hsTnI results.  Refer to the "Links" section for chest pain algorithms and additional  guidance. Performed at [facility], [address], [place], [place] [address]    DG Chest Port 1 View  Result Date: 09/15/2023 CLINICAL DATA:  Dizziness and weakness.  Altered mental status. EXAM: PORTABLE CHEST 1 VIEW COMPARISON:  12/05/2018 FINDINGS: The heart size and mediastinal contours are within normal limits. Scarring again seen in right lung base. Several calcified granulomas again seen in the left  lung. No evidence of pulmonary infiltrate or edema. No pleural effusion seen. IMPRESSION: No active disease. Electronically Signed   By: Danae Orleans M.D.   On: 09/15/2023 09:34   CT Cervical Spine Wo Contrast  Result Date: 09/15/2023 CLINICAL DATA:  Neck trauma. EXAM: CT CERVICAL SPINE WITHOUT CONTRAST TECHNIQUE: Multidetector CT imaging of the cervical spine was performed without intravenous contrast. Multiplanar CT image reconstructions were also generated. RADIATION DOSE REDUCTION: This exam was performed according to the departmental dose-optimization program which includes automated exposure control, adjustment of the mA and/or kV according to patient size and/or use of iterative reconstruction technique. COMPARISON:  10/10/2019 FINDINGS: Alignment: No traumatic malalignment. Skull base and vertebrae: No evidence for an acute fracture Soft tissues and spinal canal: No prevertebral fluid or swelling. No visible canal hematoma. Disc levels: Loss of disc height C5-6 and C6-7 with endplate degeneration. Upper chest: Unremarkable. Other: None. IMPRESSION: Degenerative changes in the cervical spine without acute fracture or traumatic malalignment. Electronically Signed   By: Kennith Center M.D.   On: 09/15/2023 09:21   CT Head Wo Contrast  Result Date: 09/15/2023 CLINICAL DATA:  Head trauma.  12/05/2022 EXAM: CT HEAD WITHOUT CONTRAST TECHNIQUE: Contiguous axial images were obtained from the base of the skull through the vertex without intravenous contrast. RADIATION DOSE REDUCTION: This exam was performed according to the departmental dose-optimization program which includes automated exposure control, adjustment of the mA and/or kV according to patient size and/or use of iterative reconstruction technique. COMPARISON:  None Available. FINDINGS: Brain: There is no  evidence for acute hemorrhage, hydrocephalus, mass lesion, or abnormal extra-axial fluid collection. No definite CT evidence for acute infarction.  Diffuse loss of parenchymal volume is consistent with atrophy. Right node cyst in the right middle cranial fossa is similar to prior. Vascular: No hyperdense vessel or unexpected calcification. Skull: No evidence for fracture. No worrisome lytic or sclerotic lesion. Stable dural calcification over the right convexity. Sinuses/Orbits: No acute finding. Other: None. IMPRESSION: Stable.  No acute intracranial abnormality. Electronically Signed   By: Kennith Center M.D.   On: 09/15/2023 09:09    Pending Labs Unresulted Labs (From admission, onward)     Start     Ordered   09/15/23 1132  Sodium, urine, random  Once,   URGENT        09/15/23 1131   09/15/23 1132  Creatinine, urine, random  Once,   URGENT        09/15/23 1131   09/15/23 1015  Urine Culture  Once,   R        09/15/23 1015            Vitals/Pain Today's Vitals   09/15/23 0815 09/15/23 0830 09/15/23 0845 09/15/23 1000  BP: (!) 133/59 127/66 (!) 124/58 118/70  Pulse: (!) 53 (!) 58 (!) 51 (!) 56  Resp: 13 19 11  (!) 21  Temp:  (!) 97.5 F (36.4 C)    TempSrc:  Oral    SpO2: 100% 100% 100% 100%  Weight:      Height:      PainSc:        Isolation Precautions No active isolations  Medications Medications  cefTRIAXone (ROCEPHIN) 1 g in sodium chloride 0.9 % 100 mL IVPB (1 g Intravenous New Bag/Given 09/15/23 1203)  Tdap (BOOSTRIX) injection 0.5 mL (0.5 mLs Intramuscular Given 09/15/23 0912)  acetaminophen (TYLENOL) tablet 1,000 mg (1,000 mg Oral Given 09/15/23 0915)  sodium chloride 0.9 % bolus 1,000 mL (0 mLs Intravenous Stopped 09/15/23 1025)    Mobility walks with person assist     Focused Assessments Neuro Assessment Handoff:  Swallow screen pass? Yes      Last date known well: 09/14/23   Neuro Assessment: Exceptions to WDL Neuro Checks:      Has TPA been given? No If patient is a Neuro Trauma and patient is going to OR before floor call report to 4N Charge nurse: [phone] or  [phone]   R Recommendations: See Admitting Provider Note  Report given to:   Additional Notes: N/A

## 2023-09-15 NOTE — Assessment & Plan Note (Addendum)
Likely ACD with CKD Baseline hgb 9-10 Denies any bleeding Check iron studies/b12/folate

## 2023-09-15 NOTE — ED Triage Notes (Signed)
Pt BIB by EMS from home with a fall. Pt got dizzy and lost balance. Hit head on counter and has small laceration. Pt was dizzy when he woke up. Denies CP and SOB. Denies pain. Pt repeating things and is A&Ox2. Baseline is A&OX4. Pt is still off balance with EMS

## 2023-09-15 NOTE — Progress Notes (Signed)
  Echocardiogram 2D Echocardiogram has been performed.  Frank Carlson 09/15/2023, 5:14 PM

## 2023-09-15 NOTE — Assessment & Plan Note (Addendum)
82 year old with history of abrupt onset of nausea and dizziness with a fall found to have a creatinine of 3.78 from baseline of 1.3-1.5 with increase to 2.2 in July 2024 -obs to tele -discussed with urology, will let me know after review records (recommended place foley for now)  -renal US in 05/2023 showed moderate to severe hydronephrosis, echogenic kidneys and enlarged prostate. Bladder scan unremarkable  -he does self cath about 2-3x/day, but urinates on own as well, denies any dysuria/urgency/frequency. Urine does have color change, odor and looks infected.  -treat UTI, culture pending -renal US pending -urine studies -strict I/O -hold metformin, was continued in July with elevated creatinine  -gentle IVF x 24 hours -trend -no improvement, may need nephrology consult and needs routine f/u with them

## 2023-09-15 NOTE — Assessment & Plan Note (Signed)
Baseline in July was 2.2-2.5

## 2023-09-15 NOTE — H&P (Signed)
History and Physical    Patient: Frank Carlson DOB: 18-Nov-1940 DOA: 09/15/2023 DOS: the patient was seen and examined on 09/15/2023 PCP: Karie Schwalbe, MD  Patient coming from: Home - lives with his wife. Ambulates independently.    Chief Complaint: fall and dizziness   HPI: Frank Carlson is a 82 y.o. male with medical history significant of BPH who self caths 3x/day, T2DM, HLD, CKD stage 3a, MCI who presented to ED after a fall.  He states he woke up this morning and felt dizzy and nauseated. He states this got worse and he tried to get up and this made him dizzy. He walked across the room and he fell and hit his head on a drawer. He is on no anti-coagulation. He denies any chest pain, palpitations, shortness of breath or light headedness. Denies LOC. He was just dizzy. He denies any N/V/D and has been eating well at home. His wife states she has had some intermittent balance issues for about 6 months.   Denies any fever/chills, vision changes/headaches, chest pain or palpitations, shortness of breath or cough, abdominal pain, N/V/D, dysuria or leg swelling.   He does not smoke or drink alcohol.   ER Course:  vitals: afebrile, bp: 127/66, HR: 58, RR: 19, oxygen: 100%RA Pertinent labs: hgb: 9.0, BUN: 49, creatinine: 3.78, UA: moderate LE, >50 WBC, +RBC  CXR: no acute finding Ct head: no acute finding right node cyst in right middle cranial fossa similar to prior.  Ct cervical spine: degenerative changes in cervical spine without acute finding  In ED: given tdap, 1L IVF bolus and rocephin. MRI brain ordered. TR[facility] asked to admit.    Review of Systems: As mentioned in the history of present illness. All other systems reviewed and are negative. 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   CONCUSSION WITH LOC OF 30 MINUTES OR LESS 11/01/2009   Diabetes mellitus without  complication (HCC)    TYPE 2   ED (erectile dysfunction)    HEARING LOSS, BILATERAL 11/01/2009   Wears hearing aids   Hyperlipidemia    JOINT STIFFNESS, HAND 10/28/2008   Pleural effusion 10/2019   Pneumonia    Type II or unspecified type diabetes mellitus with neurological manifestations, not stated as uncontrolled(250.60) 1/15   Type 2   Past Surgical History:  Procedure Laterality Date   CATARACT EXTRACTION Bilateral 07/2010   OD with IOL   COLONOSCOPY W/ POLYPECTOMY     INCISION / DRAINAGE HAND / FINGER Right    INGUINAL HERNIA REPAIR Right 08/23/2015   Procedure: LAPAROSCOPIC RIGHT INGUINAL HERNIA REPAIR WITH MESH;  Surgeon: Axel Filler, MD;  Location: [facility] OR;  Service: General;  Laterality: Right;   INSERTION OF MESH Right 08/23/2015   Procedure: INSERTION OF MESH;  Surgeon: Axel Filler, MD;  Location: [facility] OR;  Service: General;  Laterality: Right;   IR THORACENTESIS ASP PLEURAL SPACE W/IMG GUIDE  10/15/2019   TONSILLECTOMY     VASECTOMY     Social History:  reports that he quit smoking about 64 years ago. His smoking use included cigarettes. He started smoking about 69 years ago. He has a 7.6 pack-year smoking history. He has never been exposed to tobacco smoke. He has never used smokeless tobacco. He reports that he does not drink alcohol and does not use drugs.  Allergies  Allergen Reactions   Bee Venom  Other (See Comments)    Passed out    Family History  Problem Relation Age of Onset   Alzheimer's disease Mother    Dementia Mother    Coronary artery disease Father    Heart disease Father    Coronary artery disease Other    Diabetes Other    Alzheimer's disease Sister    Dementia Brother    Alzheimer's disease Brother     Prior to Admission medications   Medication Sig Start Date End Date Taking? Authorizing Provider  metFORMIN (GLUCOPHAGE) 500 MG tablet TAKE 1 TABLET TWICE DAILY WITH A MEAL 11/23/22  Yes Tillman Abide I, MD  tamsulosin (FLOMAX) 0.4 MG  CAPS capsule Take 0.8 mg by mouth daily. 10/09/22  Yes [provider]  TRUEplus Lancets 30G MISC USE TO TEST BLOOD SUGAR EVERY DAY 07/01/23   Karie Schwalbe, MD    Physical Exam: Vitals:   09/15/23 0845 09/15/23 1000 09/15/23 1323 09/15/23 1615  BP: (!) 124/58 118/70 (!) 147/77 (!) 138/59  Pulse: (!) 51 (!) 56 (!) 45 (!) 54  Resp: 11 (!) 21 16 18   Temp:    97.8 F (36.6 C)  TempSrc:    Oral  SpO2: 100% 100% 100% 100%  Weight:   78 kg   Height:   6' (1.829 m)    General:  Appears calm and comfortable and is in NAD Eyes:  PERRL, EOMI, normal lids, iris ENT:  grossly normal hearing, lips & tongue, dry mucous membranes; appropriate dentition Neck:  no LAD, masses or thyromegaly; no carotid bruits Cardiovascular:  RRR, no m/r/g. No LE edema.  Respiratory:   CTA bilaterally with no wheezes/rales/rhonchi.  Normal respiratory effort. Abdomen:  soft, NT, ND, NABS Back:   normal alignment, no CVAT Skin:  no rash or induration seen on limited exam/ + skin tenting  Musculoskeletal:  grossly normal tone BUE/BLE, good ROM, no bony abnormality Lower extremity:  No LE edema.  Limited foot exam with no ulcerations.  2+ distal pulses. Psychiatric:  grossly normal mood and affect, speech fluent and appropriate, AOx3 Neurologic:  CN 2-12 grossly intact, moves all extremities in coordinated fashion, sensation intact   Radiological Exams on Admission: Independently reviewed - see discussion in A/P where applicable  MR BRAIN WO CONTRAST  Result Date: 09/15/2023 CLINICAL DATA:  Neuro deficit, stroke suspected. EXAM: MRI HEAD WITHOUT CONTRAST TECHNIQUE: Multiplanar, multiecho pulse sequences of the brain and surrounding structures were obtained without intravenous contrast. COMPARISON:  Same-day head CT FINDINGS: Brain: There is no acute intracranial hemorrhage, extra-axial fluid collection, or acute infarct. There is background parenchymal volume loss with prominence of the ventricular  system and extra-axial CSF spaces. There is mild background chronic small-vessel ischemic change in the supratentorial white matter. A large right middle cranial fossa arachnoid cyst is unchanged, with unchanged displacement of the surrounding brain parenchyma but no parenchymal edema. 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 The pituitary and suprasellar region are normal. There is no mass lesion. There is no mass effect or midline shift. Vascular: Normal flow voids. Skull and upper cervical spine: Normal marrow signal. Sinuses/Orbits: The paranasal sinuses are clear. Bilateral lens implants are in place. The globes and orbits are otherwise unremarkable. Other: The mastoid air cells and middle ear cavities are clear. IMPRESSION: No acute intracranial pathology. Electronically Signed   By: Lesia Hausen M.D.   On: 09/15/2023 15:32   US RENAL  Result Date: 09/15/2023 CLINICAL DATA:  Renal failure  EXAM: RENAL / URINARY TRACT ULTRASOUND COMPLETE COMPARISON:  06/03/2023 FINDINGS: Right Kidney: Renal measurements: 12.3 x 4.5 x 7.0 cm = volume: 200 mL. Moderate right hydronephrosis is similar to prior. Exophytic cyst noted towards the lower pole also similar to prior. Left Kidney: Renal measurements: 13 x 0 x 6.2 x 5.6 cm = volume: 231 mL. Similar left-sided hydronephrosis. Large exophytic lower pole cyst again identified measuring up to 8.8 cm. Bladder: Bladder wall appears trabeculated scratch. Other: Prostate gland appears enlarged. IMPRESSION: 1. Similar bilateral hydronephrosis. 2. Bilateral renal cysts, stable. 3. Enlarged prostate gland with trabeculated appearance of the bladder wall raising concern for component of bladder outlet obstruction. Electronically Signed   By: Kennith Center M.D.   On: 09/15/2023 12:33   DG Chest Port 1 View  Result Date: 09/15/2023 CLINICAL DATA:  Dizziness and weakness.  Altered mental status. EXAM: PORTABLE CHEST 1 VIEW COMPARISON:  12/05/2018  FINDINGS: The heart size and mediastinal contours are within normal limits. Scarring again seen in right lung base. Several calcified granulomas again seen in the left lung. No evidence of pulmonary infiltrate or edema. No pleural effusion seen. IMPRESSION: No active disease. Electronically Signed   By: Danae Orleans M.D.   On: 09/15/2023 09:34   CT Cervical Spine Wo Contrast  Result Date: 09/15/2023 CLINICAL DATA:  Neck trauma. EXAM: CT CERVICAL SPINE WITHOUT CONTRAST TECHNIQUE: Multidetector CT imaging of the cervical spine was performed without intravenous contrast. Multiplanar CT image reconstructions were also generated. RADIATION DOSE REDUCTION: This exam was performed according to the departmental dose-optimization program which includes automated exposure control, adjustment of the mA and/or kV according to patient size and/or use of iterative reconstruction technique. COMPARISON:  10/10/2019 FINDINGS: Alignment: No traumatic malalignment. Skull base and vertebrae: No evidence for an acute fracture Soft tissues and spinal canal: No prevertebral fluid or swelling. No visible canal hematoma. Disc levels: Loss of disc height C5-6 and C6-7 with endplate degeneration. Upper chest: Unremarkable. Other: None. IMPRESSION: Degenerative changes in the cervical spine without acute fracture or traumatic malalignment. Electronically Signed   By: Kennith Center M.D.   On: 09/15/2023 09:21   CT Head Wo Contrast  Result Date: 09/15/2023 CLINICAL DATA:  Head trauma.  12/05/2022 EXAM: CT HEAD WITHOUT CONTRAST TECHNIQUE: Contiguous axial images were obtained from the base of the skull through the vertex without intravenous contrast. RADIATION DOSE REDUCTION: This exam was performed according to the departmental dose-optimization program which includes automated exposure control, adjustment of the mA and/or kV according to patient size and/or use of iterative reconstruction technique. COMPARISON:  None Available.  FINDINGS: Brain: There is no evidence for acute hemorrhage, hydrocephalus, mass lesion, or abnormal extra-axial fluid collection. No definite CT evidence for acute infarction. Diffuse loss of parenchymal volume is consistent with atrophy. Right node cyst in the right middle cranial fossa is similar to prior. Vascular: No hyperdense vessel or unexpected calcification. Skull: No evidence for fracture. No worrisome lytic or sclerotic lesion. Stable dural calcification over the right convexity. Sinuses/Orbits: No acute finding. Other: None. IMPRESSION: Stable.  No acute intracranial abnormality. Electronically Signed   By: Kennith Center M.D.   On: 09/15/2023 09:09    EKG: Independently reviewed.  NSR with rate 65; nonspecific ST changes with no evidence of acute ischemia. RBBB Similar to previous    Labs on Admission: I have personally reviewed the available labs and imaging studies at the time of the admission.  Pertinent labs:   hgb: 9.0,  BUN: 49,  creatinine: 3.78, UA: moderate LE, >50 WBC, +RBC   Assessment and Plan: Principal Problem:   Acute renal failure superimposed on stage 3b chronic kidney disease (HCC) Active Problems:   Dizziness and fall   BPH with obstruction/lower urinary tract symptoms   Normocytic anemia   Diabetes mellitus with neurological manifestations, controlled (HCC)    Assessment and Plan: * Acute renal failure superimposed on stage 3b chronic kidney disease (HCC) 82 year old with history of abrupt onset of nausea and dizziness with a fall found to have a creatinine of 3.78 from baseline of 1.3-1.5 with increase to 2.2 in July 2024 -obs to tele -discussed with urology, will let me know after review records (recommended place foley for now)  -renal US in 05/2023 showed moderate to severe hydronephrosis, echogenic kidneys and enlarged prostate. Bladder scan unremarkable  -he does self cath about 2-3x/day, but urinates on own as well, denies any  dysuria/urgency/frequency. Urine does have color change, odor and looks infected.  -treat UTI, culture pending -renal US pending -urine studies -strict I/O -hold metformin, was continued in July with elevated creatinine  -gentle IVF x 24 hours -trend -no improvement, may need nephrology consult and needs routine f/u with them    Dizziness and fall CT head with no acute finding  MRI head pending No neurological deficit  Dizziness seems better Neuro checks  Gentle IVF Check orthostatics  TED hose Check echo   Normocytic anemia Likely ACD with CKD Baseline hgb 9-10 Denies any bleeding Check iron studies/b12/folate   BPH with obstruction/lower urinary tract symptoms Urology consulted-recommended to place foley for now  Hx of self cath  Hold flomax for now until orthostatics done   Diabetes mellitus with neurological manifestations, controlled (HCC) A1C of 6.1 in July 2024 Hold metformin and d/c due to CKD  SSI and accuchecks QAC/HS    Advance Care Planning:   Code Status: Full Code   Consults: urology-Dr. Pete Glatter,  PT   DVT Prophylaxis: heparin [place]   Family Communication: wife at bedside   Severity of Illness: The appropriate patient status for this patient is OBSERVATION. Observation status is judged to be reasonable and necessary in order to provide the required intensity of service to ensure the patient's safety. The patient's presenting symptoms, physical exam findings, and initial radiographic and laboratory data in the context of their medical condition is felt to place them at decreased risk for further clinical deterioration. Furthermore, it is anticipated that the patient will be medically stable for discharge from the hospital within 2 midnights of admission.   Author: Orland Mustard, MD 09/15/2023 5:08 PM  For on call review [email or web address].

## 2023-09-15 NOTE — ED Notes (Signed)
Bladder scan 193 ml

## 2023-09-15 NOTE — Plan of Care (Signed)

## 2023-09-15 NOTE — Assessment & Plan Note (Addendum)
CT head with no acute finding  MRI head pending No neurological deficit  Dizziness seems better Neuro checks  Gentle IVF Check orthostatics  TED hose Check echo

## 2023-09-15 NOTE — ED Notes (Signed)
Called pt wife and gave update per pt request

## 2023-09-15 NOTE — ED Provider Notes (Signed)
[place] EMERGENCY DEPARTMENT AT [facility] Provider Note  CSN: [id number] Arrival date & time: 09/15/23 1478  Chief Complaint(s) Fall and Dizziness  HPI Frank Carlson is a 82 y.o. male history of diabetes, CKD presenting to the emergency department with fall.  Patient reports that he woke up this morning in bed, felt a little off, denies any spinning sensation or feeling off balance, felt nauseous and when he tried to get out of bed, fell on the way to the bathroom.  He hit his head on the counter.  Denies loss of consciousness.  Denies chest pain, shortness of breath.  Has not been up and ambulatory since this.  No neck pain.  No abdominal pain.  No vomiting.  Currently reports that he feels normal.  Denies any pain other than a tiny bit of pain where he hit his head.  No back pain.  No pain in his arms or legs.   Past Medical History 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   CONCUSSION WITH LOC OF 30 MINUTES OR LESS 11/01/2009   Diabetes mellitus without complication (HCC)    TYPE 2   ED (erectile dysfunction)    HEARING LOSS, BILATERAL 11/01/2009   Wears hearing aids   Hyperlipidemia    JOINT STIFFNESS, HAND 10/28/2008   Pleural effusion 10/2019   Pneumonia    Type II or unspecified type diabetes mellitus with neurological manifestations, not stated as uncontrolled(250.60) 1/15   Type 2   Patient Active Problem List   Diagnosis Date Noted   Acute renal failure superimposed on stage 3b chronic kidney disease (HCC) 09/15/2023   MCI (mild cognitive impairment) 05/28/2023   Acquired thrombophilia (HCC) 05/28/2023   Dyslipidemia 12/06/2022   Type 2 diabetes mellitus with chronic kidney disease (HCC) 12/06/2022   Orthostatic hypotension 12/06/2022   Stage 3a chronic kidney disease (HCC) 11/14/2021   Arachnoid cyst 07/05/2020   Aortic atherosclerosis (HCC) 05/03/2020    BPH with obstruction/lower urinary tract symptoms 04/03/2016   Advance directive discussed with patient 03/28/2015   Hyperlipidemia    Diabetes mellitus with neurological manifestations, controlled (HCC) 12/08/2013   Routine health maintenance 11/26/2011   COLOR BLINDNESS 11/01/2009   Presbycusis of both ears 11/01/2009   Home Medication(s) Prior to Admission medications   Medication Sig Start Date End Date Taking? Authorizing Provider  metFORMIN (GLUCOPHAGE) 500 MG tablet TAKE 1 TABLET TWICE DAILY WITH A MEAL 11/23/22  Yes Tillman Abide I, MD  tamsulosin (FLOMAX) 0.4 MG CAPS capsule Take 0.8 mg by mouth daily. 10/09/22  Yes [provider]  TRUEplus Lancets 30G MISC USE TO TEST BLOOD SUGAR EVERY DAY 07/01/23   Karie Schwalbe, MD  Past Surgical History Past Surgical History:  Procedure Laterality Date   CATARACT EXTRACTION Bilateral 07/2010   OD with IOL   COLONOSCOPY W/ POLYPECTOMY     INCISION / DRAINAGE HAND / FINGER Right    INGUINAL HERNIA REPAIR Right 08/23/2015   Procedure: LAPAROSCOPIC RIGHT INGUINAL HERNIA REPAIR WITH MESH;  Surgeon: Axel Filler, MD;  Location: [facility] OR;  Service: General;  Laterality: Right;   INSERTION OF MESH Right 08/23/2015   Procedure: INSERTION OF MESH;  Surgeon: Axel Filler, MD;  Location: [facility] OR;  Service: General;  Laterality: Right;   IR THORACENTESIS ASP PLEURAL SPACE W/IMG GUIDE  10/15/2019   TONSILLECTOMY     VASECTOMY     Family History Family History  Problem Relation Age of Onset   Alzheimer's disease Mother    Dementia Mother    Coronary artery disease Father    Heart disease Father    Coronary artery disease Other    Diabetes Other    Alzheimer's disease Sister    Dementia Brother    Alzheimer's disease Brother     Social History Social History   Tobacco Use   Smoking status:  Former    Current packs/day: 0.00    Average packs/day: 1.5 packs/day for 5.0 years (7.6 ttl pk-yrs)    Types: Cigarettes    Start date: 57    Quit date: 11/25/1958    Years since quitting: 64.8    Passive exposure: Never   Smokeless tobacco: Never  Vaping Use   Vaping status: Never Used  Substance Use Topics   Alcohol use: No   Drug use: No   Allergies Bee venom  Review of Systems Review of Systems  All other systems reviewed and are negative.   Physical Exam Vital Signs  I have reviewed the triage vital signs BP 118/70   Pulse (!) 56   Temp (!) 97.5 F (36.4 C) (Oral)   Resp (!) 21   Ht 6' (1.829 m)   Wt 102.1 kg   SpO2 100%   BMI 30.52 kg/m  Physical Exam Vitals and nursing note reviewed.  Constitutional:      General: He is not in acute distress.    Appearance: Normal appearance.  HENT:     Head: Normocephalic.     Comments: Small approximately 1 cm superficial laceration to the right parietal scalp    Mouth/Throat:     Mouth: Mucous membranes are moist.  Eyes:     Conjunctiva/sclera: Conjunctivae normal.  Cardiovascular:     Rate and Rhythm: Normal rate and regular rhythm.  Pulmonary:     Effort: Pulmonary effort is normal. No respiratory distress.     Breath sounds: Normal breath sounds.  Abdominal:     General: Abdomen is flat.     Palpations: Abdomen is soft.     Tenderness: There is no abdominal tenderness.  Musculoskeletal:     Right lower leg: No edema.     Left lower leg: No edema.     Comments: No midline C, T, L-spine tenderness.  No chest wall tenderness or crepitus.  Full painless range of motion at the bilateral upper extremities including the shoulders, elbows, wrists, hand and fingers, and in the bilateral lower extremities including the hips, knees, ankle, toes.  No focal bony tenderness, injury or deformity.  Skin:    General: Skin is warm and dry.     Capillary Refill: Capillary refill takes less than 2 seconds.  Neurological:  Mental Status: He is alert and oriented to person, place, and time. Mental status is at baseline.     Comments: Cranial nerves II through XII intact, strength 5 out of 5 in the bilateral upper and lower extremities, no sensory deficit to light touch  Psychiatric:        Mood and Affect: Mood normal.        Behavior: Behavior normal.     ED Results and Treatments Labs (all labs ordered are listed, but only abnormal results are displayed) Labs Reviewed  COMPREHENSIVE METABOLIC PANEL - Abnormal; Notable for the following components:      Result Value   CO2 21 (*)    Glucose, Bld 156 (*)    BUN 49 (*)    Creatinine, Ser 3.78 (*)    Albumin 3.0 (*)    AST 9 (*)    GFR, Estimated 15 (*)    All other components within normal limits  CBC WITH DIFFERENTIAL/PLATELET - Abnormal; Notable for the following components:   RBC 3.37 (*)    Hemoglobin 9.0 (*)    HCT 28.9 (*)    All other components within normal limits  URINALYSIS, W/ REFLEX TO CULTURE (INFECTION SUSPECTED) - Abnormal; Notable for the following components:   APPearance TURBID (*)    Hgb urine dipstick SMALL (*)    Protein, ur 100 (*)    Leukocytes,Ua MODERATE (*)    All other components within normal limits  URINE CULTURE  PROTIME-INR  SODIUM, URINE, RANDOM  CREATININE, URINE, RANDOM  TROPONIN I (HIGH SENSITIVITY)  TROPONIN I (HIGH SENSITIVITY)                                                                                                                          Radiology DG Chest Port 1 View  Result Date: 09/15/2023 CLINICAL DATA:  Dizziness and weakness.  Altered mental status. EXAM: PORTABLE CHEST 1 VIEW COMPARISON:  12/05/2018 FINDINGS: The heart size and mediastinal contours are within normal limits. Scarring again seen in right lung base. Several calcified granulomas again seen in the left lung. No evidence of pulmonary infiltrate or edema. No pleural effusion seen. IMPRESSION: No active disease. Electronically  Signed   By: Danae [place] M.D.   On: 09/15/2023 09:34   CT Cervical Spine Wo Contrast  Result Date: 09/15/2023 CLINICAL DATA:  Neck trauma. EXAM: CT CERVICAL SPINE WITHOUT CONTRAST TECHNIQUE: Multidetector CT imaging of the cervical spine was performed without intravenous contrast. Multiplanar CT image reconstructions were also generated. RADIATION DOSE REDUCTION: This exam was performed according to the departmental dose-optimization program which includes automated exposure control, adjustment of the mA and/or kV according to patient size and/or use of iterative reconstruction technique. COMPARISON:  10/10/2019 FINDINGS: Alignment: No traumatic malalignment. Skull base and vertebrae: No evidence for an acute fracture Soft tissues and spinal canal: No prevertebral fluid or swelling. No visible canal hematoma. Disc levels: Loss of disc height C5-6 and C6-7 with endplate degeneration. Upper  chest: Unremarkable. Other: None. IMPRESSION: Degenerative changes in the cervical spine without acute fracture or traumatic malalignment. Electronically Signed   By: Kennith Center M.D.   On: 09/15/2023 09:21   CT Head Wo Contrast  Result Date: 09/15/2023 CLINICAL DATA:  Head trauma.  12/05/2022 EXAM: CT HEAD WITHOUT CONTRAST TECHNIQUE: Contiguous axial images were obtained from the base of the skull through the vertex without intravenous contrast. RADIATION DOSE REDUCTION: This exam was performed according to the departmental dose-optimization program which includes automated exposure control, adjustment of the mA and/or kV according to patient size and/or use of iterative reconstruction technique. COMPARISON:  None Available. FINDINGS: Brain: There is no evidence for acute hemorrhage, hydrocephalus, mass lesion, or abnormal extra-axial fluid collection. No definite CT evidence for acute infarction. Diffuse loss of parenchymal volume is consistent with atrophy. Right node cyst in the right middle cranial fossa is similar  to prior. Vascular: No hyperdense vessel or unexpected calcification. Skull: No evidence for fracture. No worrisome lytic or sclerotic lesion. Stable dural calcification over the right convexity. Sinuses/Orbits: No acute finding. Other: None. IMPRESSION: Stable.  No acute intracranial abnormality. Electronically Signed   By: Kennith Center M.D.   On: 09/15/2023 09:09    Pertinent labs & imaging results that were available during my care of the patient were reviewed by me and considered in my medical decision making (see MDM for details).  Medications Ordered in ED Medications  cefTRIAXone (ROCEPHIN) 1 g in sodium chloride 0.9 % 100 mL IVPB (has no administration in time range)  Tdap (BOOSTRIX) injection 0.5 mL (0.5 mLs Intramuscular Given 09/15/23 0912)  acetaminophen (TYLENOL) tablet 1,000 mg (1,000 mg Oral Given 09/15/23 0915)  sodium chloride 0.9 % bolus 1,000 mL (0 mLs Intravenous Stopped 09/15/23 1025)                                                                                                                                     Procedures Procedures  (including critical care time)  Medical Decision Making / ED Course   MDM:  82 year old presenting to the emergency department after fall.  Patient overall very well-appearing, physical exam unremarkable other than small laceration to the scalp.  Does not appear that this would need to be repaired.  Unknown last tetanus so we will update.  Given fall will obtain CT head and CT cervical spine.  No other signs of trauma.  He denies any other localizing pain.  Unclear cause of patient's symptoms earlier this morning.  He reports that he just felt "off ".  Denies any specific symptoms like any vertiginous sensation, chest pain, difficulty breathing to suggest other underlying process like ACS, pneumonia, occult infectious process, abdominal process.  Apparently was a little bit off balance with the paramedics, neurologic exam currently normal.   Will check some basic labs, troponin.  EKG reassuring.  If workup is reassuring, patient able to ambulate, still feels  at baseline, likely safe for discharge home.    Clinical Course as of 09/15/23 1148  Sun Sep 15, 2023  1045 Labs notable for increase in his creatinine.  He does self cath.  He reports he has been compliant with this.  Not really having any fevers or chills but has some cloudy urine.  Will send urinalysis to evaluate for UTI.  He is also unsteady on ambulation trial with nursing and falling from 1 side to the other.  Will obtain MRI to evaluate for stroke. [WS]  1139 Urinalysis does show signs of UTI.  Given UTI, AKI on CKD, balance issues, believe patient will need to be admitted for further management of this.  Discussed with hospitalist Dr. Artis Flock who is admitted the patient. [WS]    Clinical Course User Index [WS] Lonell Grandchild, MD     Additional history obtained: -Additional history obtained from ems and spouse -External records from outside source obtained and reviewed including: Chart review including previous notes, labs, imaging, consultation notes including prior lab results    Lab Tests: -I ordered, reviewed, and interpreted labs.   The pertinent results include:   Labs Reviewed  COMPREHENSIVE METABOLIC PANEL - Abnormal; Notable for the following components:      Result Value   CO2 21 (*)    Glucose, Bld 156 (*)    BUN 49 (*)    Creatinine, Ser 3.78 (*)    Albumin 3.0 (*)    AST 9 (*)    GFR, Estimated 15 (*)    All other components within normal limits  CBC WITH DIFFERENTIAL/PLATELET - Abnormal; Notable for the following components:   RBC 3.37 (*)    Hemoglobin 9.0 (*)    HCT 28.9 (*)    All other components within normal limits  URINALYSIS, W/ REFLEX TO CULTURE (INFECTION SUSPECTED) - Abnormal; Notable for the following components:   APPearance TURBID (*)    Hgb urine dipstick SMALL (*)    Protein, ur 100 (*)    Leukocytes,Ua MODERATE (*)     All other components within normal limits  URINE CULTURE  PROTIME-INR  SODIUM, URINE, RANDOM  CREATININE, URINE, RANDOM  TROPONIN I (HIGH SENSITIVITY)  TROPONIN I (HIGH SENSITIVITY)    Notable for signs of UTI, AKI on CKD  EKG   EKG Interpretation Date/Time:  Sunday September 15 2023 08:05:45 EST Ventricular Rate:  65 PR Interval:  174 QRS Duration:  143 QT Interval:  441 QTC Calculation: 455 R Axis:   64  Text Interpretation: Sinus rhythm Right bundle branch block Confirmed by Alvino Blood ([id number]) on 09/15/2023 8:38:30 AM         Imaging Studies ordered: I ordered imaging studies including CT head On my interpretation imaging demonstrates no acute injury I independently visualized and interpreted imaging. I agree with the radiologist interpretation   Medicines ordered and prescription drug management: Meds ordered this encounter  Medications   Tdap (BOOSTRIX) injection 0.5 mL   acetaminophen (TYLENOL) tablet 1,000 mg   sodium chloride 0.9 % bolus 1,000 mL   cefTRIAXone (ROCEPHIN) 1 g in sodium chloride 0.9 % 100 mL IVPB    Order Specific Question:   Antibiotic Indication:    Answer:   UTI    -I have reviewed the patients home medicines and have made adjustments as needed   Consultations Obtained: I requested consultation with the hospitalist,  and discussed lab and imaging findings as well as pertinent plan - they recommend: admission  Cardiac Monitoring: The patient was maintained on a cardiac monitor.  I personally viewed and interpreted the cardiac monitored which showed an underlying rhythm of: NSR  Reevaluation: After the interventions noted above, I reevaluated the patient and found that their symptoms have improved  Co morbidities that complicate the patient evaluation  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   CONCUSSION WITH LOC OF 30  MINUTES OR LESS 11/01/2009   Diabetes mellitus without complication (HCC)    TYPE 2   ED (erectile dysfunction)    HEARING LOSS, BILATERAL 11/01/2009   Wears hearing aids   Hyperlipidemia    JOINT STIFFNESS, HAND 10/28/2008   Pleural effusion 10/2019   Pneumonia    Type II or unspecified type diabetes mellitus with neurological manifestations, not stated as uncontrolled(250.60) 1/15   Type 2      Dispostion: Disposition decision including need for hospitalization was considered, and patient admitted to the hospital.    Final Clinical Impression(s) / ED Diagnoses Final diagnoses:  Acute kidney injury superimposed on stage 3b chronic kidney disease (HCC)  Urinary tract infection without hematuria, site unspecified  Balance disorder     This chart was dictated using voice recognition software.  Despite best efforts to proofread,  errors can occur which can change the documentation meaning.    Lonell Grandchild, MD 09/15/23 [phone]

## 2023-09-16 DIAGNOSIS — F05 Delirium due to known physiological condition: Secondary | ICD-10-CM | POA: Diagnosis not present

## 2023-09-16 DIAGNOSIS — Z9842 Cataract extraction status, left eye: Secondary | ICD-10-CM | POA: Diagnosis not present

## 2023-09-16 DIAGNOSIS — Z833 Family history of diabetes mellitus: Secondary | ICD-10-CM | POA: Diagnosis not present

## 2023-09-16 DIAGNOSIS — D631 Anemia in chronic kidney disease: Secondary | ICD-10-CM | POA: Diagnosis present

## 2023-09-16 DIAGNOSIS — E1122 Type 2 diabetes mellitus with diabetic chronic kidney disease: Secondary | ICD-10-CM | POA: Diagnosis present

## 2023-09-16 DIAGNOSIS — N179 Acute kidney failure, unspecified: Secondary | ICD-10-CM | POA: Diagnosis present

## 2023-09-16 DIAGNOSIS — E785 Hyperlipidemia, unspecified: Secondary | ICD-10-CM | POA: Diagnosis present

## 2023-09-16 DIAGNOSIS — E1149 Type 2 diabetes mellitus with other diabetic neurological complication: Secondary | ICD-10-CM | POA: Diagnosis present

## 2023-09-16 DIAGNOSIS — B952 Enterococcus as the cause of diseases classified elsewhere: Secondary | ICD-10-CM | POA: Diagnosis present

## 2023-09-16 DIAGNOSIS — Z23 Encounter for immunization: Secondary | ICD-10-CM | POA: Diagnosis present

## 2023-09-16 DIAGNOSIS — W01190A Fall on same level from slipping, tripping and stumbling with subsequent striking against furniture, initial encounter: Secondary | ICD-10-CM | POA: Diagnosis present

## 2023-09-16 DIAGNOSIS — Z7984 Long term (current) use of oral hypoglycemic drugs: Secondary | ICD-10-CM | POA: Diagnosis not present

## 2023-09-16 DIAGNOSIS — S0101XA Laceration without foreign body of scalp, initial encounter: Secondary | ICD-10-CM | POA: Diagnosis present

## 2023-09-16 DIAGNOSIS — N138 Other obstructive and reflux uropathy: Secondary | ICD-10-CM | POA: Diagnosis present

## 2023-09-16 DIAGNOSIS — Z961 Presence of intraocular lens: Secondary | ICD-10-CM | POA: Diagnosis present

## 2023-09-16 DIAGNOSIS — Z87891 Personal history of nicotine dependence: Secondary | ICD-10-CM | POA: Diagnosis not present

## 2023-09-16 DIAGNOSIS — N390 Urinary tract infection, site not specified: Secondary | ICD-10-CM | POA: Diagnosis present

## 2023-09-16 DIAGNOSIS — Z9841 Cataract extraction status, right eye: Secondary | ICD-10-CM | POA: Diagnosis not present

## 2023-09-16 DIAGNOSIS — N1832 Chronic kidney disease, stage 3b: Secondary | ICD-10-CM | POA: Diagnosis present

## 2023-09-16 DIAGNOSIS — R2689 Other abnormalities of gait and mobility: Secondary | ICD-10-CM | POA: Diagnosis present

## 2023-09-16 DIAGNOSIS — Z974 Presence of external hearing-aid: Secondary | ICD-10-CM | POA: Diagnosis not present

## 2023-09-16 DIAGNOSIS — Z79899 Other long term (current) drug therapy: Secondary | ICD-10-CM | POA: Diagnosis not present

## 2023-09-16 DIAGNOSIS — N401 Enlarged prostate with lower urinary tract symptoms: Secondary | ICD-10-CM | POA: Diagnosis present

## 2023-09-16 DIAGNOSIS — I951 Orthostatic hypotension: Secondary | ICD-10-CM | POA: Diagnosis present

## 2023-09-16 DIAGNOSIS — H9193 Unspecified hearing loss, bilateral: Secondary | ICD-10-CM | POA: Diagnosis present

## 2023-09-16 DIAGNOSIS — Z860100 Personal history of colon polyps, unspecified: Secondary | ICD-10-CM | POA: Diagnosis not present

## 2023-09-16 LAB — BASIC METABOLIC PANEL
Anion gap: 14 (ref 5–15)
BUN: 41 mg/dL — ABNORMAL HIGH (ref 8–23)
CO2: 19 mmol/L — ABNORMAL LOW (ref 22–32)
Calcium: 9.1 mg/dL (ref 8.9–10.3)
Chloride: 109 mmol/L (ref 98–111)
Creatinine, Ser: 3.51 mg/dL — ABNORMAL HIGH (ref 0.61–1.24)
GFR, Estimated: 16 mL/min — ABNORMAL LOW (ref >60)
Glucose, Bld: 186 mg/dL — ABNORMAL HIGH (ref 70–99)
Potassium: 4.3 mmol/L (ref 3.5–5.1)
Sodium: 142 mmol/L (ref 135–145)

## 2023-09-16 LAB — GLUCOSE, CAPILLARY
Glucose-Capillary: 130 mg/dL — ABNORMAL HIGH (ref 70–99)
Glucose-Capillary: 106 mg/dL — ABNORMAL HIGH (ref 70–99)
Glucose-Capillary: 116 mg/dL — ABNORMAL HIGH (ref 70–99)

## 2023-09-16 LAB — MAGNESIUM: Magnesium: 2 mg/dL (ref 1.7–2.4)

## 2023-09-16 LAB — CBC
HCT: 27.8 % — ABNORMAL LOW (ref 39.0–52.0)
Hemoglobin: 9.1 g/dL — ABNORMAL LOW (ref 13.0–17.0)
MCH: 27.5 pg (ref 26.0–34.0)
MCHC: 32.7 g/dL (ref 30.0–36.0)
MCV: 84 fL (ref 80.0–100.0)
Platelets: 266 10*3/uL (ref 150–400)
RBC: 3.31 MIL/uL — ABNORMAL LOW (ref 4.22–5.81)
RDW: 15.4 % (ref 11.5–15.5)
WBC: 6.9 10*3/uL (ref 4.0–10.5)
nRBC: 0 % (ref 0.0–0.2)

## 2023-09-16 LAB — PHOSPHORUS: Phosphorus: 3.6 mg/dL (ref 2.5–4.6)

## 2023-09-16 MED ORDER — FINASTERIDE 5 MG PO TABS
5.0000 mg | ORAL_TABLET | Freq: Every day | ORAL | Status: DC
  Administered 2023-09-16 – 2023-09-19 (×4): 5 mg via ORAL
  Filled 2023-09-16 (×4): qty 1

## 2023-09-16 MED ORDER — QUETIAPINE FUMARATE 25 MG PO TABS
25.0000 mg | ORAL_TABLET | Freq: Two times a day (BID) | ORAL | Status: DC
  Administered 2023-09-16 – 2023-09-19 (×7): 25 mg via ORAL
  Filled 2023-09-16 (×7): qty 1

## 2023-09-16 MED ORDER — CHLORHEXIDINE GLUCONATE CLOTH 2 % EX PADS
6.0000 | MEDICATED_PAD | Freq: Every day | CUTANEOUS | Status: DC
  Administered 2023-09-16 – 2023-09-19 (×4): 6 via TOPICAL

## 2023-09-16 MED ORDER — POLYSACCHARIDE IRON COMPLEX 150 MG PO CAPS
150.0000 mg | ORAL_CAPSULE | Freq: Every day | ORAL | Status: DC
  Administered 2023-09-16 – 2023-09-19 (×4): 150 mg via ORAL
  Filled 2023-09-16 (×4): qty 1

## 2023-09-16 MED ORDER — SODIUM BICARBONATE 650 MG PO TABS
650.0000 mg | ORAL_TABLET | Freq: Three times a day (TID) | ORAL | Status: AC
  Administered 2023-09-16 – 2023-09-19 (×9): 650 mg via ORAL
  Filled 2023-09-16 (×9): qty 1

## 2023-09-16 MED ORDER — TAMSULOSIN HCL 0.4 MG PO CAPS: 0.4000 mg | ORAL_CAPSULE | Freq: Every day | ORAL | Status: DC

## 2023-09-16 MED ORDER — HALOPERIDOL LACTATE 5 MG/ML IJ SOLN
5.0000 mg | Freq: Once | INTRAMUSCULAR | Status: DC
  Filled 2023-09-16: qty 1

## 2023-09-16 MED ORDER — HALOPERIDOL LACTATE 5 MG/ML IJ SOLN
5.0000 mg | Freq: Once | INTRAMUSCULAR | Status: AC
  Administered 2023-09-16: 5 mg via INTRAMUSCULAR

## 2023-09-16 NOTE — Progress Notes (Addendum)
Patient confused, agitated and physically and verbally aggressive. Patient pulled his IV off. Haldol was given.  Bed alarm in place.  Frank Carlson

## 2023-09-16 NOTE — Progress Notes (Signed)
Unable to place patient back on tele monitor. Patient refusing and pulling stickers off.  Frank Carlson

## 2023-09-16 NOTE — Evaluation (Signed)
Physical Therapy Evaluation Patient Details Name: Frank Carlson MRN: [id number] DOB: [birth year] Today's Date: 09/16/2023  History of Present Illness  82 y.o. male presenting to the emergency department 11/3 with fall. History of diabetes, CKD, BPH who self caths 3x/day, T2DM, HLD, CKD stage 3a, MCI.   Clinical Impression  Pt admitted with above diagnosis. Limited historian, unsure of cognition at baseline, wife not present during evaluation however pt states he is ind at home, uses RW intermittently. He is unsure of what hospital he is in and states that staff is lying when providing reason for admission; denies falling at home PTA, and that this occurred a month ago. Pt able to mobilize out of bed without physical assist. Was supervision for safety with gait, using RW for support, mild dizziness reported (orthostatics taken below.) Pt currently with functional limitations due to the deficits listed below (see PT Problem List). May benefit from OPPT follow up after d/c due to fall, but suspect this could be related to his orthostatics. Pt will benefit from acute skilled PT to increase their independence and safety with mobility to allow discharge.         09/16/23 0900  Orthostatic Lying   BP- Lying 139/80  Pulse- Lying 66  Orthostatic Sitting  BP- Sitting 129/68  Pulse- Sitting 71  Orthostatic Standing at 0 minutes  BP- Standing at 0 minutes 106/78  Pulse- Standing at 0 minutes 88  Orthostatic Standing at 3 minutes  BP- Standing at 3 minutes 117/82  Pulse- Standing at 3 minutes 101       If plan is discharge home, recommend the following: Supervision due to cognitive status;Assistance with cooking/housework;Direct supervision/assist for medications management;Direct supervision/assist for financial management;Assist for transportation;Help with stairs or ramp for entrance   Can travel by private vehicle        Equipment Recommendations None recommended by PT  Recommendations  for Other Services       Functional Status Assessment Patient has had a recent decline in their functional status and demonstrates the ability to make significant improvements in function in a reasonable and predictable amount of time.     Precautions / Restrictions Precautions Precautions: Fall Precaution Comments: orthostatic Restrictions Weight Bearing Restrictions: No      Mobility  Bed Mobility Overal bed mobility: Modified Independent             General bed mobility comments: extra time, VC to facilitate    Transfers Overall transfer level: Needs assistance Equipment used: Rolling walker (2 wheels) Transfers: Sit to/from Stand Sit to Stand: Supervision           General transfer comment: Supervision for safety, good power up to stand, RW for support upon rising.    Ambulation/Gait Ambulation/Gait assistance: Supervision Gait Distance (Feet): 75 Feet Assistive device: Rolling walker (2 wheels) Gait Pattern/deviations: Step-through pattern, Decreased stride length Gait velocity: dec Gait velocity interpretation: <1.8 ft/sec, indicate of risk for recurrent falls   General Gait Details: Grossly stable with RW for support, no overt LOB noted, decreased step length but not shuffling. Educated on AD use with RW for safety and support due to recent fall. Navigates through narrow spaces in room. Pt reports mild dizziness while ambulating.  Stairs            Wheelchair Mobility     Tilt Bed    Modified Rankin (Stroke Patients Only)       Balance Overall balance assessment: Mild deficits observed, not formally tested  Pertinent Vitals/Pain Pain Assessment Pain Assessment: No/denies pain    Home Living Family/patient expects to be discharged to:: Private residence Living Arrangements: Spouse/significant other Available Help at Discharge: Family Type of Home: House           Home  Equipment: Agricultural consultant (2 wheels);Cane - single point Additional Comments: Limited historian    Prior Function Prior Level of Function : Patient poor historian/Family not available             Mobility Comments: States he ambulates with intermittent use of RW at home ADLs Comments: States ind. Family not present to confirm PLOF     Extremity/Trunk Assessment   Upper Extremity Assessment Upper Extremity Assessment: Defer to OT evaluation    Lower Extremity Assessment Lower Extremity Assessment: Generalized weakness;Difficult to assess due to impaired cognition       Communication   Communication Communication: Hearing impairment  Cognition Arousal: Alert Behavior During Therapy: WFL for tasks assessed/performed Overall Cognitive Status: No family/caregiver present to determine baseline cognitive functioning Area of Impairment: Orientation, Safety/judgement                 Orientation Level: Person, Disoriented to, Situation       Safety/Judgement: Decreased awareness of safety     General Comments: Pt unsure of which hospital he is in. States he did not fall PTA, that the fall occurred a month ago and everyone is lying.        General Comments General comments (skin integrity, edema, etc.): See orthostatic vitals tab    Exercises     Assessment/Plan    PT Assessment Patient needs continued PT services  PT Problem List Decreased strength;Decreased activity tolerance;Decreased balance;Decreased mobility;Decreased cognition;Decreased knowledge of use of DME;Decreased safety awareness;Decreased knowledge of precautions;Cardiopulmonary status limiting activity       PT Treatment Interventions DME instruction;Gait training;Stair training;Functional mobility training;Therapeutic activities;Therapeutic exercise;Balance training;Neuromuscular re-education;Cognitive remediation;Patient/family education    PT Goals (Current goals can be found in the Care Plan  section)  Acute Rehab PT Goals Patient Stated Goal: go home PT Goal Formulation: With patient Time For Goal Achievement: 09/30/23 Potential to Achieve Goals: Good    Frequency Min 1X/week     Co-evaluation               AM-PAC PT "6 Clicks" Mobility  Outcome Measure Help needed turning from your back to your side while in a flat bed without using bedrails?: None Help needed moving from lying on your back to sitting on the side of a flat bed without using bedrails?: A Little Help needed moving to and from a bed to a chair (including a wheelchair)?: A Little Help needed standing up from a chair using your arms (e.g., wheelchair or bedside chair)?: A Little Help needed to walk in hospital room?: A Little Help needed climbing 3-5 steps with a railing? : A Little 6 Click Score: 19    End of Session Equipment Utilized During Treatment: Gait belt Activity Tolerance: Patient tolerated treatment well Patient left: in chair;with call bell/phone within reach;with chair alarm set (MD in room with pt) Nurse Communication: Mobility status PT Visit Diagnosis: Other abnormalities of gait and mobility (R26.89);History of falling (Z91.81);Muscle weakness (generalized) (M62.81);Other symptoms and signs involving the nervous system (R29.898)    Time: 1023-1050 PT Time Calculation (min) (ACUTE ONLY): 27 min   Charges:   PT Evaluation $PT Eval Low Complexity: 1 Low PT Treatments $Gait Training: 8-22 mins PT General Charges $$ ACUTE PT  VISIT: 1 Visit         Kathlyn Sacramento, PT, DPT [facility]  Rehabilitation Services Physical Therapist Office: [phone] Website: [email or web address]   Berton Mount 09/16/2023, 10:59 AM

## 2023-09-16 NOTE — Progress Notes (Signed)
[facility] Progress Note  Patient: Frank Carlson    ZOX:[id number]  DOA: 09/15/2023     Date of Service: the patient was seen and examined on 09/16/2023  Chief Complaint  Patient presents with   Fall   Dizziness   Brief hospital course: Frank Carlson is a 82 y.o. male with medical history significant of BPH who self caths 3x/day, T2DM, HLD, CKD stage 3a, MCI who presented to ED after a fall.  He states he woke up this morning and felt dizzy and nauseated. He states this got worse and he tried to get up and this made him dizzy. He walked across the room and he fell and hit his head on a drawer. He is on no anti-coagulation. He denies any chest pain, palpitations, shortness of breath or light headedness. Denies LOC. He was just dizzy. He denies any N/V/D and has been eating well at home. His wife states she has had some intermittent balance issues for about 6 months.    Denies any fever/chills, vision changes/headaches, chest pain or palpitations, shortness of breath or cough, abdominal pain, N/V/D, dysuria or leg swelling.    He does not smoke or drink alcohol.    ER Course:  vitals: afebrile, bp: 127/66, HR: 58, RR: 19, oxygen: 100%RA Pertinent labs: hgb: 9.0, BUN: 49, creatinine: 3.78, UA: moderate LE, >50 WBC, +RBC  CXR: no acute finding Ct head: no acute finding right node cyst in right middle cranial fossa similar to prior.  Ct cervical spine: degenerative changes in cervical spine without acute finding  In ED: given tdap, 1L IVF bolus and rocephin. MRI brain ordered. [facility] asked to admit.    Assessment and Plan:  # Acute renal failure superimposed on CKD stage 66b 82 year old with history of abrupt onset of nausea and dizziness with a fall found to have a creatinine of 3.78 from baseline of 1.3-1.5 with increase to 2.2 in July 2024 -discussed with urology, will let me know after review records (recommended place foley for now)  -renal US in 05/2023 showed moderate to severe  hydronephrosis, echogenic kidneys and enlarged prostate. Bladder scan unremarkable  -he does self cath about 2-3x/day, but urinates on own as well, denies any dysuria/urgency/frequency. Urine does have color change, odor and looks infected.  11/3 US renal: 1. Similar bilateral hydronephrosis. 2. Bilateral renal cysts, stable. 3. Enlarged prostate gland with trabeculated appearance of the bladder wall raising concern for component of bladder outlet obstruction. -treat UTI, culture pending -renal US pending -urine studies -strict I/O -hold metformin, was continued in July with elevated creatinine  -gentle IVF x 24 hours -trend -no improvement, may need nephrology consult and needs routine f/u with them  Mild metabolic acidosis, started bicarbonate 650 mg p.o. 3 times daily Check electrolytes daily.  # UTI, UA positive, urine culture growing GNR Continue ceftriaxone 1 g IV daily Follow complete urine culture report  Dizziness and fall due to orthostatic hypotension CT head with no acute finding  MRI head: No acute intracranial pathology.  No neurological deficit  Dizziness seems better Neuro checks  Gentle IVF orthostatics positive  TED hose echo LVEF 55 to 60%, grade 1 diastolic dysfunction.   Normocytic anemia Likely ACD with CKD Baseline hgb 9-10 Denies any bleeding Iron deficiency, transferrin sat 6%, started oral iron supplement. Folate within normal range, B12 elevated   BPH with obstruction/lower urinary tract symptoms Urology consulted-recommended to place foley for now  Hx of self cath  Hold flomax  for now until orthostatic hypotension resolved Started Proscar Urology consulted as above, recommended Foley catheter.  Patient was advised to follow-up with urology in 1 week for voiding trial as an outpatient   Diabetes mellitus with neurological manifestations, controlled (HCC) A1C of 6.1 in July 2024 Hold metformin and d/c due to CKD  SSI and accuchecks QAC/HS    Acute delirium and possible sundowning Started Seroquel 25 mg po BID    Body mass index is 23.32 kg/m.  Interventions:  Diet: Carb modified diet DVT Prophylaxis: Subcutaneous Heparin    Advance goals of care discussion: Full code  Family Communication: family was not present at bedside, at the time of interview.  The pt provided permission to discuss medical plan with the family. Opportunity was given to ask question and all questions were answered satisfactorily.   Disposition:  Pt is from Home, admitted with dizziness and fall, found to have orthostatic hypotension and AKI, urinary retention Foley catheter was inserted, still has orthostatic hypotension and elevated creatinine, which precludes a safe discharge. Discharge to Home, when stable. May need few days to improve.  Subjective: No significant overnight events, patient had orthostatic hypotension while working with PT, slight dizziness which improved after few minutes.  Denied any complaints, no headache or dizziness, no chest pain or palpitation, no shortness of breath.  Patient was sitting comfortably on the recliner after working with PT. As per RN patient had delirium and sundowning last night and he was confused in the morning, refuse labs.   Physical Exam: General: NAD, lying comfortably Appear in no distress, affect appropriate Eyes: PERRLA ENT: Oral Mucosa Clear, moist  Neck: no JVD,  Cardiovascular: S1 and S2 Present, no Murmur,  Respiratory: good respiratory effort, Bilateral Air entry equal and Decreased, no Crackles, no wheezes Abdomen: Bowel Sound present, Soft and no tenderness,  Skin: no rashes Extremities: no Pedal edema, no calf tenderness Neurologic: without any new focal findings Gait not checked due to patient safety concerns  Vitals:   09/15/23 2345 09/16/23 0327 09/16/23 0736 09/16/23 1212  BP: (!) 143/68 128/74 (!) 110/56 137/69  Pulse: 61 65 68 (!) 56  Resp: 16 18 16 12   Temp: 98.6 F (37  C) 98 F (36.7 C) 98.1 F (36.7 C) 98 F (36.7 C)  TempSrc: Oral  Oral Oral  SpO2: 97% 100% 99% 100%  Weight:      Height:        Intake/Output Summary (Last 24 hours) at 09/16/2023 1314 Last data filed at 09/16/2023 0500 Gross per 24 hour  Intake 294.2 ml  Output 1275 ml  Net -980.8 ml   Filed Weights   09/15/23 0804 09/15/23 1323  Weight: 102.1 kg 78 kg    Data Reviewed: I have personally reviewed and interpreted daily labs, tele strips, imagings as discussed above. I reviewed all nursing notes, pharmacy notes, vitals, pertinent old records I have discussed plan of care as described above with RN and patient/family.  CBC: Recent Labs  Lab 09/15/23 0840 09/15/23 1343 09/16/23 1144  WBC 7.6 6.0 6.9  NEUTROABS 5.6  --   --   HGB 9.0* 9.2* 9.1*  HCT 28.9* 29.2* 27.8*  MCV 85.8 85.4 84.0  PLT 270 271 266   Basic Metabolic Panel: Recent Labs  Lab 09/15/23 0840 09/15/23 1343 09/16/23 1144  NA 140  --  142  K 4.8  --  4.3  CL 107  --  109  CO2 21*  --  19*  GLUCOSE 156*  --  186*  BUN 49*  --  41*  CREATININE 3.78* 3.84* 3.51*  CALCIUM 9.3  --  9.1  MG  --  2.1 2.0  PHOS  --   --  3.6    Studies: ECHOCARDIOGRAM COMPLETE  Result Date: 09/15/2023    ECHOCARDIOGRAM REPORT   Patient Name:   MIKIE MISNER Date of Exam: 09/15/2023 Medical Rec #:  [id number]        Height:       72.0 in Accession #:    [id number]       Weight:       172.0 lb Date of Birth:  03-14-1941        BSA:          1.998 m Patient Age:    82 years         BP:           138/59 mmHg Patient Gender: M                HR:           54 bpm. Exam Location:  Inpatient Procedure: 2D Echo, Color Doppler and Cardiac Doppler Indications:    syncope  History:        Patient has no prior history of Echocardiogram examinations.                 Chronic kidney disease; Risk Factors:Dyslipidemia and Diabetes.  Sonographer:    Delcie Roch RDCS Referring Phys: [id number] Orland Mustard  Sonographer Comments:  Image acquisition challenging due to respiratory motion. IMPRESSIONS  1. Left ventricular ejection fraction, by estimation, is 55 to 60%. The left ventricle has normal function. The left ventricle has no regional wall motion abnormalities. Left ventricular diastolic parameters are consistent with Grade I diastolic dysfunction (impaired relaxation).  2. Right ventricular systolic function is normal. The right ventricular size is mildly enlarged. Tricuspid regurgitation signal is inadequate for assessing PA pressure.  3. The mitral valve is degenerative. Trivial mitral valve regurgitation.  4. The aortic valve is tricuspid. Aortic valve regurgitation is mild to moderate. Aortic valve sclerosis is present, with no evidence of aortic valve stenosis.  5. The inferior vena cava is normal in size with greater than 50% respiratory variability, suggesting right atrial pressure of 3 mmHg. Comparison(s): No prior Echocardiogram. FINDINGS  Left Ventricle: Left ventricular ejection fraction, by estimation, is 55 to 60%. The left ventricle has normal function. The left ventricle has no regional wall motion abnormalities. The left ventricular internal cavity size was normal in size. There is  borderline left ventricular hypertrophy. Left ventricular diastolic parameters are consistent with Grade I diastolic dysfunction (impaired relaxation). Right Ventricle: The right ventricular size is mildly enlarged. No increase in right ventricular wall thickness. Right ventricular systolic function is normal. Tricuspid regurgitation signal is inadequate for assessing PA pressure. Left Atrium: Left atrial size was normal in size. Right Atrium: Right atrial size was normal in size. Pericardium: There is no evidence of pericardial effusion. Mitral Valve: The mitral valve is degenerative in appearance. There is mild thickening of the mitral valve leaflet(s). Trivial mitral valve regurgitation. Tricuspid Valve: The tricuspid valve is grossly  normal. Tricuspid valve regurgitation is trivial. Aortic Valve: The aortic valve is tricuspid. There is mild aortic valve annular calcification. Aortic valve regurgitation is mild to moderate. Aortic valve sclerosis is present, with no evidence of aortic valve stenosis. Pulmonic Valve: The pulmonic valve was grossly normal. Pulmonic valve regurgitation is trivial. Aorta: The aortic  root and ascending aorta are structurally normal, with no evidence of dilitation. Venous: The inferior vena cava is normal in size with greater than 50% respiratory variability, suggesting right atrial pressure of 3 mmHg. IAS/Shunts: No atrial level shunt detected by color flow Doppler.  LEFT VENTRICLE PLAX 2D LVIDd:         4.90 cm      Diastology LVIDs:         2.90 cm      LV e' medial:    8.16 cm/s LV PW:         0.80 cm      LV E/e' medial:  9.4 LV IVS:        1.10 cm      LV e' lateral:   8.59 cm/s                             LV E/e' lateral: 8.9  LV Volumes (MOD) LV vol d, MOD A2C: 87.3 ml LV vol d, MOD A4C: 112.0 ml LV vol s, MOD A2C: 46.0 ml LV vol s, MOD A4C: 45.1 ml LV SV MOD A2C:     41.3 ml LV SV MOD A4C:     112.0 ml LV SV MOD BP:      57.7 ml RIGHT VENTRICLE             IVC RV Basal diam:  2.70 cm     IVC diam: 1.20 cm RV S prime:     15.20 cm/s TAPSE (M-mode): 2.5 cm LEFT ATRIUM         Index       RIGHT ATRIUM           Index LA diam:    3.20 cm 1.60 cm/m  RA Area:     16.30 cm                                 RA Volume:   43.70 ml  21.87 ml/m  AORTIC VALVE             PULMONIC VALVE LVOT Vmax:   75.70 cm/s  PR End Diast Vel: 1.83 msec LVOT Vmean:  49.700 cm/s LVOT VTI:    0.195 m  AORTA Ao Asc diam: 3.50 cm MITRAL VALVE MV Area (PHT): 3.12 cm    SHUNTS MV Decel Time: 243 msec    Systemic VTI: 0.20 m MV E velocity: 76.70 cm/s MV A velocity: 78.50 cm/s MV E/A ratio:  0.98 Nona Dell MD Electronically signed by Nona Dell MD Signature Date/Time: 09/15/2023/6:36:22 PM    Final    MR BRAIN WO CONTRAST  Result  Date: 09/15/2023 CLINICAL DATA:  Neuro deficit, stroke suspected. EXAM: MRI HEAD WITHOUT CONTRAST TECHNIQUE: Multiplanar, multiecho pulse sequences of the brain and surrounding structures were obtained without intravenous contrast. COMPARISON:  Same-day head CT FINDINGS: Brain: There is no acute intracranial hemorrhage, extra-axial fluid collection, or acute infarct. There is background parenchymal volume loss with prominence of the ventricular system and extra-axial CSF spaces. There is mild background chronic small-vessel ischemic change in the supratentorial white matter. A large right middle cranial fossa arachnoid cyst is unchanged, with unchanged displacement of the surrounding brain parenchyma but no parenchymal edema. 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 The pituitary and suprasellar region are normal. There is no mass lesion. There is  no mass effect or midline shift. Vascular: Normal flow voids. Skull and upper cervical spine: Normal marrow signal. Sinuses/Orbits: The paranasal sinuses are clear. Bilateral lens implants are in place. The globes and orbits are otherwise unremarkable. Other: The mastoid air cells and middle ear cavities are clear. IMPRESSION: No acute intracranial pathology. Electronically Signed   By: Lesia Hausen M.D.   On: 09/15/2023 15:32    Scheduled Meds:  Chlorhexidine Gluconate Cloth  6 each Topical Daily   heparin  5,000 Units Subcutaneous Q8H   insulin aspart  0-9 Units Subcutaneous TID WC   iron polysaccharides  150 mg Oral Daily   Continuous Infusions:  cefTRIAXone (ROCEPHIN)  IV 1 g (09/16/23 1059)   PRN Meds: acetaminophen **OR** acetaminophen, ondansetron **OR** ondansetron (ZOFRAN) IV, mouth rinse  Time spent: 55 minutes  Author: Gillis Santa. MD [facility] 09/16/2023 1:14 PM  To reach On-call, see care teams to locate the attending and reach out to them via [email or web address]. If 7PM-7AM, please contact night-coverage If you  still have difficulty reaching the attending provider, please page the [facility] (Director on Call) for [facility] on amion for assistance.

## 2023-09-16 NOTE — Plan of Care (Signed)
  Problem: Education: Goal: Ability to describe self-care measures that may prevent or decrease complications (Diabetes Survival Skills Education) will improve Outcome: Progressing Goal: Individualized Educational Video(s) Outcome: Progressing   Problem: Coping: Goal: Ability to adjust to condition or change in health will improve Outcome: Progressing   Problem: Fluid Volume: Goal: Ability to maintain a balanced intake and output will improve Outcome: Progressing   Problem: Health Behavior/Discharge Planning: Goal: Ability to identify and utilize available resources and services will improve Outcome: Progressing Goal: Ability to manage health-related needs will improve Outcome: Progressing   Problem: Metabolic: Goal: Ability to maintain appropriate glucose levels will improve Outcome: Progressing   Problem: Nutritional: Goal: Maintenance of adequate nutrition will improve Outcome: Progressing Goal: Progress toward achieving an optimal weight will improve Outcome: Progressing   Problem: Skin Integrity: Goal: Risk for impaired skin integrity will decrease Outcome: Progressing   Problem: Tissue Perfusion: Goal: Adequacy of tissue perfusion will improve Outcome: Progressing   Problem: Education: Goal: Knowledge of General Education information will improve Description: Including pain rating scale, medication(s)/side effects and non-pharmacologic comfort measures Outcome: Progressing   Problem: Health Behavior/Discharge Planning: Goal: Ability to manage health-related needs will improve Outcome: Progressing   Problem: Clinical Measurements: Goal: Ability to maintain clinical measurements within normal limits will improve Outcome: Progressing Goal: Will remain free from infection Outcome: Progressing Goal: Diagnostic test results will improve Outcome: Progressing Goal: Respiratory complications will improve Outcome: Progressing Goal: Cardiovascular complication will  be avoided Outcome: Progressing   Problem: Activity: Goal: Risk for activity intolerance will decrease Outcome: Progressing   Problem: Nutrition: Goal: Adequate nutrition will be maintained Outcome: Progressing   Problem: Elimination: Goal: Will not experience complications related to bowel motility Outcome: Progressing Goal: Will not experience complications related to urinary retention Outcome: Progressing   Problem: Pain Management: Goal: General experience of comfort will improve Outcome: Progressing   Problem: Coping: Goal: Level of anxiety will decrease Outcome: Not Progressing   Problem: Safety: Goal: Ability to remain free from injury will improve Outcome: Not Progressing  Patient developed hospital delirium overnight, developed two new skin tear on LFA. Patient refused skin care. Haldol give.   Problem: Skin Integrity: Goal: Risk for impaired skin integrity will decrease Outcome: Not Progressing  Patient developed two new skin tear from moving in bed, refused dressing placement. Will reassess patient to put dressing on when he's more oriented.

## 2023-09-16 NOTE — Progress Notes (Signed)
Transition of Care [facility]) - Inpatient Brief Assessment   Patient Details  Name: Frank Carlson MRN: [id number] Date of Birth: 1941/05/13  [facility] CM/SW Contact:    Janae Bridgeman, RN Phone Number: 09/16/2023, 4:11 PM   Clinical Narrative: CM noted that patient admitted to the hospital with fall at home and acute renal failure.  The patient lives with his wife at the home and has a RW and [place] at the home.  The wife is present at the bedside and can provide transportation to appointments.  I spoke with the patient at the bedside and placed OUtpatient referral for PT.  Follow up placed in the AVS,  No other TOC needs at this time.   Transition of Care Asessment: Insurance and Status: (P) Insurance coverage has been reviewed Patient has primary care physician: (P) Yes Home environment has been reviewed: (P) From home with spouse Prior level of function:: (P) Independent with assistance from wife at the home Prior/Current Home Services: (P) No current home services Social Determinants of Health Reivew: (P) SDOH reviewed interventions complete Readmission risk has been reviewed: (P) Yes Transition of care needs: (P) transition of care needs identified, TOC will continue to follow

## 2023-09-17 DIAGNOSIS — N179 Acute kidney failure, unspecified: Secondary | ICD-10-CM | POA: Diagnosis not present

## 2023-09-17 DIAGNOSIS — N1832 Chronic kidney disease, stage 3b: Secondary | ICD-10-CM | POA: Diagnosis not present

## 2023-09-17 LAB — GLUCOSE, CAPILLARY
Glucose-Capillary: 128 mg/dL — ABNORMAL HIGH (ref 70–99)
Glucose-Capillary: 108 mg/dL — ABNORMAL HIGH (ref 70–99)
Glucose-Capillary: 150 mg/dL — ABNORMAL HIGH (ref 70–99)
Glucose-Capillary: 79 mg/dL (ref 70–99)

## 2023-09-17 LAB — BASIC METABOLIC PANEL
Anion gap: 9 (ref 5–15)
BUN: 44 mg/dL — ABNORMAL HIGH (ref 8–23)
CO2: 20 mmol/L — ABNORMAL LOW (ref 22–32)
Calcium: 9 mg/dL (ref 8.9–10.3)
Chloride: 113 mmol/L — ABNORMAL HIGH (ref 98–111)
Creatinine, Ser: 3.27 mg/dL — ABNORMAL HIGH (ref 0.61–1.24)
GFR, Estimated: 18 mL/min — ABNORMAL LOW (ref >60)
Glucose, Bld: 111 mg/dL — ABNORMAL HIGH (ref 70–99)
Potassium: 5.1 mmol/L (ref 3.5–5.1)
Sodium: 142 mmol/L (ref 135–145)

## 2023-09-17 LAB — CBC
HCT: 27 % — ABNORMAL LOW (ref 39.0–52.0)
Hemoglobin: 8.6 g/dL — ABNORMAL LOW (ref 13.0–17.0)
MCH: 26.9 pg (ref 26.0–34.0)
MCHC: 31.9 g/dL (ref 30.0–36.0)
MCV: 84.4 fL (ref 80.0–100.0)
Platelets: 256 10*3/uL (ref 150–400)
RBC: 3.2 MIL/uL — ABNORMAL LOW (ref 4.22–5.81)
RDW: 15.8 % — ABNORMAL HIGH (ref 11.5–15.5)
WBC: 6.7 10*3/uL (ref 4.0–10.5)
nRBC: 0 % (ref 0.0–0.2)

## 2023-09-17 LAB — MAGNESIUM: Magnesium: 2.2 mg/dL (ref 1.7–2.4)

## 2023-09-17 LAB — URINE CULTURE: Culture: >=100000 — AB

## 2023-09-17 LAB — PHOSPHORUS: Phosphorus: 3.7 mg/dL (ref 2.5–4.6)

## 2023-09-17 MED ORDER — CIPROFLOXACIN HCL 500 MG PO TABS
500.0000 mg | ORAL_TABLET | ORAL | Status: DC
  Administered 2023-09-17 – 2023-09-18 (×2): 500 mg via ORAL
  Filled 2023-09-17 (×3): qty 1

## 2023-09-17 MED ORDER — SODIUM CHLORIDE 0.9 % IV SOLN: INTRAVENOUS | Status: AC

## 2023-09-17 MED ORDER — SODIUM ZIRCONIUM CYCLOSILICATE 10 G PO PACK
10.0000 g | PACK | Freq: Once | ORAL | Status: AC
  Administered 2023-09-17: 10 g via ORAL
  Filled 2023-09-17: qty 1

## 2023-09-17 NOTE — Progress Notes (Signed)
Physical Therapy Treatment Patient Details Name: Frank Carlson MRN: [id number] DOB: May 23, 1941 Today's Date: 09/17/2023   History of Present Illness 82 y.o. male presenting to the emergency department 11/3 with fall. History of diabetes, CKD, BPH who self caths 3x/day, T2DM, HLD, CKD stage 3a, MCI.    PT Comments  Pt received in supine and agreeable to session. Pt making good progress towards functional mobility goals and reports no orthostatic symptoms this session. Pt demonstrates some impaired safety awareness requiring cues intermittently. Pt able to tolerate increased gait distance this session with intermittent CGA during turns and without AD due to increased instability. Pt continues to benefit from PT services to progress toward functional mobility goals.     If plan is discharge home, recommend the following: Supervision due to cognitive status;Assistance with cooking/housework;Direct supervision/assist for medications management;Direct supervision/assist for financial management;Assist for transportation;Help with stairs or ramp for entrance   Can travel by private vehicle        Equipment Recommendations  None recommended by PT    Recommendations for Other Services       Precautions / Restrictions Precautions Precautions: Fall Precaution Comments: orthostatic Restrictions Weight Bearing Restrictions: No     Mobility  Bed Mobility Overal bed mobility: Modified Independent                  Transfers Overall transfer level: Needs assistance Equipment used: Rolling walker (2 wheels) Transfers: Sit to/from Stand Sit to Stand: Supervision           General transfer comment: cues for safe hand placement    Ambulation/Gait Ambulation/Gait assistance: Supervision, Contact guard assist Gait Distance (Feet): 150 Feet Assistive device: Rolling walker (2 wheels), None Gait Pattern/deviations: Step-through pattern, Decreased stride length, Narrow base of  support       General Gait Details: steady gait with RW support. Pt noted to be almost scissoring at times and demonstrates increased unsteadiness during turns, but no LOB. Pt walking around the bed to the recliner without RW, however reaches out to bed for support      Balance Overall balance assessment: Mild deficits observed, not formally tested                                          Cognition Arousal: Alert Behavior During Therapy: [facility] for tasks assessed/performed Overall Cognitive Status: No family/caregiver present to determine baseline cognitive functioning                                          Exercises      General Comments General comments (skin integrity, edema, etc.): BP sitting: 127/81; standing: 115/62; post gait: 128/67. Pt reporting no dizziness or other adverse symptoms throughout session      Pertinent Vitals/Pain Pain Assessment Pain Assessment: No/denies pain      PT Goals (current goals can now be found in the care plan section) Acute Rehab PT Goals Patient Stated Goal: go home PT Goal Formulation: With patient Time For Goal Achievement: 09/30/23 Progress towards PT goals: Progressing toward goals    Frequency    Min 1X/week       AM-PAC PT "6 Clicks" Mobility   Outcome Measure  Help needed turning from your back to your side while in a flat bed without  using bedrails?: None Help needed moving from lying on your back to sitting on the side of a flat bed without using bedrails?: None Help needed moving to and from a bed to a chair (including a wheelchair)?: A Little Help needed standing up from a chair using your arms (e.g., wheelchair or bedside chair)?: A Little Help needed to walk in hospital room?: A Little Help needed climbing 3-5 steps with a railing? : A Little 6 Click Score: 20    End of Session Equipment Utilized During Treatment: Gait belt Activity Tolerance: Patient tolerated treatment  well Patient left: in chair;with call bell/phone within reach;with chair alarm set Nurse Communication: Mobility status PT Visit Diagnosis: Other abnormalities of gait and mobility (R26.89);History of falling (Z91.81);Muscle weakness (generalized) (M62.81);Other symptoms and signs involving the nervous system (R29.898)     Time: 1610-9604 PT Time Calculation (min) (ACUTE ONLY): 23 min  Charges:    $Gait Training: 8-22 mins $Therapeutic Activity: 8-22 mins PT General Charges $$ ACUTE PT VISIT: 1 Visit                     Johny Shock, PTA Acute Rehabilitation Services Secure Chat Preferred  [phone]    Johny Shock 09/17/2023, 1:26 PM

## 2023-09-17 NOTE — Progress Notes (Signed)
[facility] Progress Note  Patient: Frank Carlson    ZOX:[id number]  DOA: 09/15/2023     Date of Service: the patient was seen and examined on 09/17/2023  Chief Complaint  Patient presents with   Fall   Dizziness   Brief hospital course: Frank Carlson is a 82 y.o. male with medical history significant of BPH who self caths 3x/day, T2DM, HLD, CKD stage 3a, MCI who presented to ED after a fall.  He states he woke up this morning and felt dizzy and nauseated. He states this got worse and he tried to get up and this made him dizzy. He walked across the room and he fell and hit his head on a drawer. He is on no anti-coagulation. He denies any chest pain, palpitations, shortness of breath or light headedness. Denies LOC. He was just dizzy. He denies any N/V/D and has been eating well at home. His wife states she has had some intermittent balance issues for about 6 months.    Denies any fever/chills, vision changes/headaches, chest pain or palpitations, shortness of breath or cough, abdominal pain, N/V/D, dysuria or leg swelling.    He does not smoke or drink alcohol.    ER Course:  vitals: afebrile, bp: 127/66, HR: 58, RR: 19, oxygen: 100%RA Pertinent labs: hgb: 9.0, BUN: 49, creatinine: 3.78, UA: moderate LE, >50 WBC, +RBC  CXR: no acute finding Ct head: no acute finding right node cyst in right middle cranial fossa similar to prior.  Ct cervical spine: degenerative changes in cervical spine without acute finding  In ED: given tdap, 1L IVF bolus and rocephin. MRI brain ordered. [facility] asked to admit.    Assessment and Plan:  # Acute renal failure superimposed on CKD stage 78b 82 year old with history of abrupt onset of nausea and dizziness with a fall found to have a creatinine of 3.78 from baseline of 1.3-1.5 with increase to 2.2 in July 2024 -discussed with urology, will let me know after review records (recommended place foley for now)  -renal US in 05/2023 showed moderate to severe  hydronephrosis, echogenic kidneys and enlarged prostate. Bladder scan unremarkable  -he does self cath about 2-3x/day, but urinates on own as well, denies any dysuria/urgency/frequency. Urine does have color change, odor and looks infected.  11/3 US renal: 1. Similar bilateral hydronephrosis. 2. Bilateral renal cysts, stable. 3. Enlarged prostate gland with trabeculated appearance of the bladder wall raising concern for component of bladder outlet obstruction. -treat UTI, culture growing Enterobacter cloaca, pansensitive -renal US as below -strict I/O -hold metformin, was continued in July with elevated creatinine  - gentle IVF NS 75 x 24 hours -trend renal function and electrolytes -no improvement, may need nephrology consult and needs routine f/u with them  Mild metabolic acidosis, started bicarbonate 650 mg p.o. 3 times daily Cr 3.84---3.27 improving Co 21--20--19  K 5.1 at higher end, Lokelma 10 g one-time dose ordered Check electrolytes daily.   # UTI, UA positive, urine culture growing GNR Continue ceftriaxone 1 g IV daily Follow complete urine culture report  Dizziness and fall due to orthostatic hypotension CT head with no acute finding  MRI head: No acute intracranial pathology.  No neurological deficit  Dizziness seems better Neuro checks  Gentle IVF orthostatics positive  TED hose echo LVEF 55 to 60%, grade 1 diastolic dysfunction. 11/5 orthostatics positive but no symptoms.  Normocytic anemia Likely ACD with CKD Baseline hgb 9-10 Denies any bleeding Iron deficiency, transferrin sat 6%, started  oral iron supplement. Folate within normal range, B12 elevated   BPH with obstruction/lower urinary tract symptoms Urology consulted-recommended to place foley for now  Hx of self cath  Hold flomax for now until orthostatic hypotension resolved Started Proscar Urology consulted as above, recommended Foley catheter.  Patient was advised to follow-up with urology in 1 week  for voiding trial as an outpatient   Diabetes mellitus with neurological manifestations, controlled (HCC) A1C of 6.1 in July 2024 Hold metformin and d/c due to CKD  SSI and accuchecks QAC/HS   Acute delirium and possible sundowning Started Seroquel 25 mg po BID    Body mass index is 23.32 kg/m.  Interventions:  Diet: Carb modified diet DVT Prophylaxis: Subcutaneous Heparin    Advance goals of care discussion: Full code  Family Communication: family was not present at bedside, at the time of interview.  The pt provided permission to discuss medical plan with the family. Opportunity was given to ask question and all questions were answered satisfactorily.   Disposition:  Pt is from Home, admitted with dizziness and fall, found to have orthostatic hypotension and AKI, urinary retention Foley catheter was inserted, still has orthostatic hypotension and elevated creatinine, which precludes a safe discharge. Discharge to Home, when stable. May need few days to improve.  Subjective: No significant overnight events, patient was laying comfortably, denied any complaints, no nausea vomiting or dizziness.  Patient is ambulating in the room without any symptoms. Will continue to monitor today for electrolytes and continue IV fluid for gentle hydration, plan is to discharge him home tomorrow if remains stable.    Physical Exam: General: NAD, lying comfortably Appear in no distress, affect appropriate Eyes: PERRLA ENT: Oral Mucosa Clear, moist  Neck: no JVD,  Cardiovascular: S1 and S2 Present, no Murmur,  Respiratory: good respiratory effort, Bilateral Air entry equal and Decreased, no Crackles, no wheezes Abdomen: Bowel Sound present, Soft and no tenderness,  Skin: no rashes Extremities: no Pedal edema, no calf tenderness Neurologic: without any new focal findings Gait not checked due to patient safety concerns  Vitals:   09/16/23 1941 09/17/23 0321 09/17/23 0822 09/17/23 1025  BP:  121/65 132/71 136/69 (!) 140/74  Pulse: 61 (!) 58 (!) 51 (!) 54  Resp: 18 18    Temp: 98.3 F (36.8 C) 97.7 F (36.5 C) (!) 97.3 F (36.3 C)   TempSrc:  Oral Oral   SpO2: 97% 99% 99% 100%  Weight:      Height:        Intake/Output Summary (Last 24 hours) at 09/17/2023 1253 Last data filed at 09/17/2023 1045 Gross per 24 hour  Intake 1045 ml  Output 1930 ml  Net -885 ml   Filed Weights   09/15/23 0804 09/15/23 1323  Weight: 102.1 kg 78 kg    Data Reviewed: I have personally reviewed and interpreted daily labs, tele strips, imagings as discussed above. I reviewed all nursing notes, pharmacy notes, vitals, pertinent old records I have discussed plan of care as described above with RN and patient/family.  CBC: Recent Labs  Lab 09/15/23 0840 09/15/23 1343 09/16/23 1144 09/17/23 0536  WBC 7.6 6.0 6.9 6.7  NEUTROABS 5.6  --   --   --   HGB 9.0* 9.2* 9.1* 8.6*  HCT 28.9* 29.2* 27.8* 27.0*  MCV 85.8 85.4 84.0 84.4  PLT 270 271 266 256   Basic Metabolic Panel: Recent Labs  Lab 09/15/23 0840 09/15/23 1343 09/16/23 1144 09/17/23 0536  NA 140  --  142 142  K 4.8  --  4.3 5.1  CL 107  --  109 113*  CO2 21*  --  19* 20*  GLUCOSE 156*  --  186* 111*  BUN 49*  --  41* 44*  CREATININE 3.78* 3.84* 3.51* 3.27*  CALCIUM 9.3  --  9.1 9.0  MG  --  2.1 2.0 2.2  PHOS  --   --  3.6 3.7    Studies: No results found.  Scheduled Meds:  Chlorhexidine Gluconate Cloth  6 each Topical Daily   ciprofloxacin  500 mg Oral Q24H   finasteride  5 mg Oral Daily   heparin  5,000 Units Subcutaneous Q8H   insulin aspart  0-9 Units Subcutaneous TID WC   iron polysaccharides  150 mg Oral Daily   QUEtiapine  25 mg Oral BID   sodium bicarbonate  650 mg Oral TID   Continuous Infusions:   PRN Meds: acetaminophen **OR** acetaminophen, ondansetron **OR** ondansetron (ZOFRAN) IV, mouth rinse  Time spent: 55 minutes  Author: Gillis Santa. MD [facility] 09/17/2023 12:53 PM  To  reach On-call, see care teams to locate the attending and reach out to them via [email or web address]. If 7PM-7AM, please contact night-coverage If you still have difficulty reaching the attending provider, please page the [facility] (Director on Call) for [facility] on amion for assistance.

## 2023-09-17 NOTE — Evaluation (Signed)
Occupational Therapy Evaluation Patient Details Name: Frank Carlson MRN: [id number] DOB: 12/14/1940 Today's Date: 09/17/2023   History of Present Illness 82 y.o. male presenting to the emergency department 11/3 with fall. History of diabetes, CKD, BPH who self caths 3x/day, T2DM, HLD, CKD stage 3a, MCI.   Clinical Impression   Patient admitted for the diagnosis above.  PTA he lives at home with his spouse, states occasional use of RW for mobility in the home, and continues to complete his own ADL.  Patient needing up to CGA without the RW for in room mobility and ADL completion.  Safety concerns persist, but given souse assist, no post acute OT is anticipated.  OT will continue efforts in the acute setting to address deficits.         If plan is discharge home, recommend the following: Direct supervision/assist for medications management;Assist for transportation;Supervision due to cognitive status    Functional Status Assessment  Patient has had a recent decline in their functional status and demonstrates the ability to make significant improvements in function in a reasonable and predictable amount of time.  Equipment Recommendations  None recommended by OT    Recommendations for Other Services       Precautions / Restrictions Precautions Precautions: Fall Restrictions Weight Bearing Restrictions: No      Mobility Bed Mobility Overal bed mobility: Modified Independent                  Transfers Overall transfer level: Needs assistance Equipment used: Rolling walker (2 wheels), None Transfers: Sit to/from Stand, Bed to chair/wheelchair/BSC Sit to Stand: Supervision     Step pivot transfers: Contact guard assist     General transfer comment: without RW, reaching for objects in room to steady      Balance Overall balance assessment: Mild deficits observed, not formally tested                                         ADL either performed  or assessed with clinical judgement   ADL       Grooming: Oral care;Supervision/safety;Standing               Lower Body Dressing: Contact guard assist;Sit to/from stand   Toilet Transfer: Contact guard assist;Regular Toilet;Rolling walker (2 wheels)                   Vision Baseline Vision/History: 1 Wears glasses Patient Visual Report: No change from baseline       Perception Perception: Not tested       Praxis Praxis: Not tested       Pertinent Vitals/Pain Pain Assessment Pain Assessment: No/denies pain     Extremity/Trunk Assessment Upper Extremity Assessment Upper Extremity Assessment: Overall WFL for tasks assessed   Lower Extremity Assessment Lower Extremity Assessment: Defer to PT evaluation   Cervical / Trunk Assessment Cervical / Trunk Assessment: Kyphotic   Communication Communication Communication: Hearing impairment   Cognition Arousal: Alert Behavior During Therapy: WFL for tasks assessed/performed Overall Cognitive Status: No family/caregiver present to determine baseline cognitive functioning                           Safety/Judgement: Decreased awareness of safety, Decreased awareness of deficits           General Comments   VSS    Exercises  Shoulder Instructions      Home Living Family/patient expects to be discharged to:: Private residence Living Arrangements: Spouse/significant other Available Help at Discharge: Family Type of Home: House Home Access: Level entry     Home Layout: One level         Bathroom Toilet: Standard Bathroom Accessibility: Yes How Accessible: Accessible via walker Home Equipment: Rolling Walker (2 wheels);Cane - single point          Prior Functioning/Environment               Mobility Comments: Occasional use of 2WRW depending on blance that day, does furniture walk ADLs Comments: Ind with ADL, limited participation with iADL        OT Problem List:  Impaired balance (sitting and/or standing);Decreased safety awareness      OT Treatment/Interventions: Self-care/ADL training;Therapeutic activities;Balance training    OT Goals(Current goals can be found in the care plan section) Acute Rehab OT Goals Patient Stated Goal: Return home OT Goal Formulation: With patient Time For Goal Achievement: 10/01/23 Potential to Achieve Goals: Good ADL Goals Pt Will Perform Grooming: Independently;standing Pt Will Perform Lower Body Dressing: Independently;sit to/from stand Pt Will Transfer to Toilet: Independently;ambulating;regular height toilet  OT Frequency: Min 1X/week    Co-evaluation              AM-PAC OT "6 Clicks" Daily Activity     Outcome Measure Help from another person eating meals?: None Help from another person taking care of personal grooming?: A Little Help from another person toileting, which includes using toliet, bedpan, or urinal?: A Little Help from another person bathing (including washing, rinsing, drying)?: A Little Help from another person to put on and taking off regular upper body clothing?: None Help from another person to put on and taking off regular lower body clothing?: A Little 6 Click Score: 20   End of Session Equipment Utilized During Treatment: Rolling walker (2 wheels) Nurse Communication: Mobility status  Activity Tolerance: Patient tolerated treatment well Patient left: in chair;with call bell/phone within reach;with chair alarm set  OT Visit Diagnosis: Unsteadiness on feet (R26.81)                Time: 1610-9604 OT Time Calculation (min): 24 min Charges:  OT General Charges $OT Visit: 1 Visit OT Evaluation $OT Eval Moderate Complexity: 1 Mod OT Treatments $Self Care/Home Management : 8-22 mins  09/17/2023  RP, OTR/L  Acute Rehabilitation Services  Office:  [phone]   Frank Carlson 09/17/2023, 9:53 AM

## 2023-09-17 NOTE — Plan of Care (Signed)
  Problem: Clinical Measurements: Goal: Respiratory complications will improve Outcome: Progressing Goal: Cardiovascular complication will be avoided Outcome: Progressing   Problem: Activity: Goal: Risk for activity intolerance will decrease Outcome: Progressing   Problem: Coping: Goal: Level of anxiety will decrease Outcome: Progressing   

## 2023-09-18 ENCOUNTER — Other Ambulatory Visit (HOSPITAL_COMMUNITY): Payer: Self-pay

## 2023-09-18 DIAGNOSIS — N1832 Chronic kidney disease, stage 3b: Secondary | ICD-10-CM | POA: Diagnosis not present

## 2023-09-18 DIAGNOSIS — N179 Acute kidney failure, unspecified: Secondary | ICD-10-CM | POA: Diagnosis not present

## 2023-09-18 LAB — PHOSPHORUS: Phosphorus: 3.7 mg/dL (ref 2.5–4.6)

## 2023-09-18 LAB — CBC
HCT: 26.2 % — ABNORMAL LOW (ref 39.0–52.0)
Hemoglobin: 8.3 g/dL — ABNORMAL LOW (ref 13.0–17.0)
MCH: 26.5 pg (ref 26.0–34.0)
MCHC: 31.7 g/dL (ref 30.0–36.0)
MCV: 83.7 fL (ref 80.0–100.0)
Platelets: 241 10*3/uL (ref 150–400)
RBC: 3.13 MIL/uL — ABNORMAL LOW (ref 4.22–5.81)
RDW: 15.6 % — ABNORMAL HIGH (ref 11.5–15.5)
WBC: 6.3 10*3/uL (ref 4.0–10.5)
nRBC: 0 % (ref 0.0–0.2)

## 2023-09-18 LAB — BASIC METABOLIC PANEL
Anion gap: 7 (ref 5–15)
BUN: 39 mg/dL — ABNORMAL HIGH (ref 8–23)
CO2: 21 mmol/L — ABNORMAL LOW (ref 22–32)
Calcium: 8.7 mg/dL — ABNORMAL LOW (ref 8.9–10.3)
Chloride: 112 mmol/L — ABNORMAL HIGH (ref 98–111)
Creatinine, Ser: 3.08 mg/dL — ABNORMAL HIGH (ref 0.61–1.24)
GFR, Estimated: 19 mL/min — ABNORMAL LOW (ref >60)
Glucose, Bld: 100 mg/dL — ABNORMAL HIGH (ref 70–99)
Potassium: 4 mmol/L (ref 3.5–5.1)
Sodium: 140 mmol/L (ref 135–145)

## 2023-09-18 LAB — GLUCOSE, CAPILLARY
Glucose-Capillary: 102 mg/dL — ABNORMAL HIGH (ref 70–99)
Glucose-Capillary: 168 mg/dL — ABNORMAL HIGH (ref 70–99)
Glucose-Capillary: 94 mg/dL (ref 70–99)
Glucose-Capillary: 137 mg/dL — ABNORMAL HIGH (ref 70–99)

## 2023-09-18 LAB — MAGNESIUM: Magnesium: 1.9 mg/dL (ref 1.7–2.4)

## 2023-09-18 MED ORDER — CIPROFLOXACIN HCL 500 MG PO TABS
500.0000 mg | ORAL_TABLET | ORAL | 0 refills | Status: AC
  Filled 2023-09-18: qty 4, 4d supply, fill #0

## 2023-09-18 NOTE — Plan of Care (Signed)
  Problem: Fluid Volume: Goal: Ability to maintain a balanced intake and output will improve Outcome: Progressing   Problem: Nutritional: Goal: Maintenance of adequate nutrition will improve Outcome: Progressing   Problem: Skin Integrity: Goal: Risk for impaired skin integrity will decrease Outcome: Progressing   Problem: Clinical Measurements: Goal: Respiratory complications will improve Outcome: Progressing   Problem: Activity: Goal: Risk for activity intolerance will decrease Outcome: Progressing

## 2023-09-18 NOTE — Plan of Care (Signed)

## 2023-09-18 NOTE — Discharge Instructions (Signed)
Follow with Karie Schwalbe, MD in 5-7 days  Please get a complete blood count and chemistry panel checked by your Primary MD at your next visit, and again as instructed by your Primary MD. Please get your medications reviewed and adjusted by your Primary MD.  Please request your Primary MD to go over all Hospital Tests and Procedure/Radiological results at the follow up, please get all Hospital records sent to your Prim MD by signing hospital release before you go home.  In some cases, there will be blood work, cultures and biopsy results pending at the time of your discharge. Please request that your primary care M.D. goes through all the records of your hospital data and follows up on these results.  If you had Pneumonia of Lung problems at the Hospital: Please get a 2 view Chest X ray done in 6-8 weeks after hospital discharge or sooner if instructed by your Primary MD.  If you have Congestive Heart Failure: Please call your Cardiologist or Primary MD anytime you have any of the following symptoms:  1) 3 pound weight gain in 24 hours or 5 pounds in 1 week  2) shortness of breath, with or without a dry hacking cough  3) swelling in the hands, feet or stomach  4) if you have to sleep on extra pillows at night in order to breathe  Follow cardiac low salt diet and 1.5 lit/day fluid restriction.  If you have diabetes Accuchecks 4 times/day, Once in AM empty stomach and then before each meal. Log in all results and show them to your primary doctor at your next visit. If any glucose reading is under 80 or above 300 call your primary MD immediately.  If you have Seizure/Convulsions/Epilepsy: Please do not drive, operate heavy machinery, participate in activities at heights or participate in high speed sports until you have seen by Primary MD or a Neurologist and advised to do so again. Per [facility] statutes, patients with seizures are not allowed to drive until they have been  seizure-free for six months.  Use caution when using heavy equipment or power tools. Avoid working on ladders or at heights. Take showers instead of baths. Ensure the water temperature is not too high on the home water heater. Do not go swimming alone. Do not lock yourself in a room alone (i.e. bathroom). When caring for infants or small children, sit down when holding, feeding, or changing them to minimize risk of injury to the child in the event you have a seizure. Maintain good sleep hygiene. Avoid alcohol.   If you had Gastrointestinal Bleeding: Please ask your Primary MD to check a complete blood count within one week of discharge or at your next visit. Your endoscopic/colonoscopic biopsies that are pending at the time of discharge, will also need to followed by your Primary MD.  Get Medicines reviewed and adjusted. Please take all your medications with you for your next visit with your Primary MD  Please request your Primary MD to go over all hospital tests and procedure/radiological results at the follow up, please ask your Primary MD to get all Hospital records sent to his/her office.  If you experience worsening of your admission symptoms, develop shortness of breath, life threatening emergency, suicidal or homicidal thoughts you must seek medical attention immediately by calling 911 or calling your MD immediately  if symptoms less severe.  You must read complete instructions/literature along with all the possible adverse reactions/side effects for all the Medicines you  take and that have been prescribed to you. Take any new Medicines after you have completely understood and accpet all the possible adverse reactions/side effects.   Do not drive or operate heavy machinery when taking Pain medications.   Do not take more than prescribed Pain, Sleep and Anxiety Medications  Special Instructions: If you have smoked or chewed Tobacco  in the last 2 yrs please stop smoking, stop any regular  Alcohol  and or any Recreational drug use.  Wear Seat belts while driving.  Please note You were cared for by a hospitalist during your hospital stay. If you have any questions about your discharge medications or the care you received while you were in the hospital after you are discharged, you can call the unit and asked to speak with the hospitalist on call if the hospitalist that took care of you is not available. Once you are discharged, your primary care physician will handle any further medical issues. Please note that NO REFILLS for any discharge medications will be authorized once you are discharged, as it is imperative that you return to your primary care physician (or establish a relationship with a primary care physician if you do not have one) for your aftercare needs so that they can reassess your need for medications and monitor your lab values.  You can reach the hospitalist office at phone [phone] or fax [phone]   If you do not have a primary care physician, you can call [phone] for a physician referral.  Activity: As tolerated with Full fall precautions use walker/cane & assistance as needed    Diet: regular  Disposition Home

## 2023-09-18 NOTE — Progress Notes (Signed)
Mobility Specialist Progress Note:   09/18/23 0850  Mobility  Activity Ambulated with assistance in hallway  Level of Assistance Minimal assist, patient does 75% or more  Assistive Device None  Distance Ambulated (ft) 125 ft  Activity Response Tolerated well  Mobility Referral Yes  $Mobility charge 1 Mobility  Mobility Specialist Start Time (ACUTE ONLY) 0850  Mobility Specialist Stop Time (ACUTE ONLY) 0905  Mobility Specialist Time Calculation (min) (ACUTE ONLY) 15 min   Pt received in bed, agreeable to mobility session. Demonstrated an increased risk to falls during mobility. Despite verbal cues to refrain, pt attempted to bend down and pick items off the floor. Displayed some instability in gait, drifting, and LOB, requiring MinA to ensure safety. Returned pt to room, laying in bed, all needs met.   Feliciana Rossetti Mobility Specialist Please contact via Special educational needs teacher or  Rehab office at [phone]

## 2023-09-18 NOTE — Progress Notes (Signed)
PROGRESS NOTE  Frank Carlson NWG:[id number] DOB: 1941/11/07 DOA: 09/15/2023 PCP: Karie Schwalbe, MD   LOS: 2 days   Brief Narrative / Interim history: 82 year old male with history of BPH who self caths at home, DM2, CKD 3B, HLD, DM 2 comes into the hospital after feeling weak, dizzy and had a fall.  He was found to have acute kidney injury and urinary retention, and a Foley catheter had to be placed.  He was also found to have a UTI  Subjective / 24h Interval events: He is doing well this morning, feels stronger.  Was able to walk with physical therapy about 150 feet yesterday  Assesement and Plan: Principal Problem:   Acute renal failure superimposed on stage 3b chronic kidney disease (HCC) Active Problems:   Orthostatic hypotension   Dizziness and fall   BPH with obstruction/lower urinary tract symptoms   Normocytic anemia   Diabetes mellitus with neurological manifestations, controlled (HCC)   Principal problem Acute kidney injury on CKD 3B -most recent creatinine at 2.2 in July 2024.  Creatinine on admission was around 3.8, had a Foley catheter placed and now improving at 3.0.  Renal ultrasound showed similar bilateral hydronephrosis as to prior ultrasound in the past -AKI improving.  Hold metformin  Active problems Enterococcus UTI-continue ciprofloxacin  Orthostatic hypotension-CT of the head as well as MRI without acute intracranial pathology.  He received IV fluids with improvement.  Underwent a 2D echo which showed LVEF 55 to 60%, grade 1 DD. He is now improved  BPH -with significant obstruction, had a Foley catheter placed.  Discussed with Alvera Novel, NP, will need outpatient follow-up.  Appointment has been made with Anne Fu, NP, December 10 at 2:15 PM  Acute delirium-started on Seroquel  DM 2, controlled-placed on sliding scale.  A1c 6.1  Scheduled Meds:  Chlorhexidine Gluconate Cloth  6 each Topical Daily   ciprofloxacin  500 mg Oral Q24H    finasteride  5 mg Oral Daily   heparin  5,000 Units Subcutaneous Q8H   insulin aspart  0-9 Units Subcutaneous TID WC   iron polysaccharides  150 mg Oral Daily   QUEtiapine  25 mg Oral BID   sodium bicarbonate  650 mg Oral TID   Continuous Infusions:  sodium chloride 75 mL/hr at 09/18/23 0517   PRN Meds:.acetaminophen **OR** acetaminophen, ondansetron **OR** ondansetron (ZOFRAN) IV, mouth rinse  Current Outpatient Medications  Medication Instructions   ciprofloxacin (CIPRO) 500 mg, Oral, Every 24 hours   metFORMIN (GLUCOPHAGE) 500 MG tablet TAKE 1 TABLET TWICE DAILY WITH A MEAL   tamsulosin (FLOMAX) 0.8 mg, Oral, Daily   TRUEplus Lancets 30G MISC USE TO TEST BLOOD SUGAR EVERY DAY    Diet Orders (From admission, onward)     Start     Ordered   09/17/23 0857  Diet Carb Modified Fluid consistency: Thin; Room service appropriate? Yes  Diet effective now       Comments: Low potassium diet  Question Answer Comment  Diet-HS Snack? Nothing   Calorie Level Medium 1600-2000   Fluid consistency: Thin   Room service appropriate? Yes      09/17/23 0856            DVT prophylaxis: heparin injection 5,000 Units Start: 09/15/23 1400   Lab Results  Component Value Date   PLT 241 09/18/2023      Code Status: Full Code  Family Communication: no family at bedside, spoke with wife over the phone  Status is: Inpatient Remains  inpatient appropriate because: AKI   Level of care: Telemetry Medical  Consultants:  none  Objective: Vitals:   09/17/23 1025 09/17/23 1554 09/17/23 1943 09/18/23 0728  BP: (!) 140/74 128/61 121/62 133/79  Pulse: (!) 54 (!) 52 61 (!) 50  Resp:   18   Temp:  (!) 97.5 F (36.4 C) 98.1 F (36.7 C) 98.4 F (36.9 C)  TempSrc:  Oral Oral Oral  SpO2: 100% 100% 99% 99%  Weight:      Height:        Intake/Output Summary (Last 24 hours) at 09/18/2023 0942 Last data filed at 09/18/2023 0309 Gross per 24 hour  Intake 1076.34 ml  Output 2050 ml  Net  -973.66 ml   Wt Readings from Last 3 Encounters:  09/15/23 78 kg  05/28/23 82.1 kg  12/17/22 86.6 kg    Examination:  Constitutional: NAD Eyes: no scleral icterus ENMT: Mucous membranes are moist.  Neck: normal, supple Respiratory: clear to auscultation bilaterally, no wheezing, no crackles. Normal respiratory effort. Cardiovascular: Regular rate and rhythm, no murmurs / rubs / gallops. No LE edema.  Abdomen: non distended, no tenderness. Bowel sounds positive.  Musculoskeletal: no clubbing / cyanosis.  Skin: no rashes   Data Reviewed: I have independently reviewed following labs and imaging studies   CBC Recent Labs  Lab 09/15/23 0840 09/15/23 1343 09/16/23 1144 09/17/23 0536 09/18/23 0451  WBC 7.6 6.0 6.9 6.7 6.3  HGB 9.0* 9.2* 9.1* 8.6* 8.3*  HCT 28.9* 29.2* 27.8* 27.0* 26.2*  PLT 270 271 266 256 241  MCV 85.8 85.4 84.0 84.4 83.7  MCH 26.7 26.9 27.5 26.9 26.5  MCHC 31.1 31.5 32.7 31.9 31.7  RDW 15.5 15.4 15.4 15.8* 15.6*  LYMPHSABS 1.1  --   --   --   --   MONOABS 0.8  --   --   --   --   EOSABS 0.0  --   --   --   --   BASOSABS 0.0  --   --   --   --     Recent Labs  Lab 09/15/23 0840 09/15/23 1343 09/16/23 1144 09/17/23 0536 09/18/23 0451  NA 140  --  142 142 140  K 4.8  --  4.3 5.1 4.0  CL 107  --  109 113* 112*  CO2 21*  --  19* 20* 21*  GLUCOSE 156*  --  186* 111* 100*  BUN 49*  --  41* 44* 39*  CREATININE 3.78* 3.84* 3.51* 3.27* 3.08*  CALCIUM 9.3  --  9.1 9.0 8.7*  AST 9*  --   --   --   --   ALT 9  --   --   --   --   ALKPHOS 46  --   --   --   --   BILITOT 0.4  --   --   --   --   ALBUMIN 3.0*  --   --   --   --   MG  --  2.1 2.0 2.2 1.9  INR 1.1  --   --   --   --   TSH  --  2.538  --   --   --     ------------------------------------------------------------------------------------------------------------------ No results for input(s): "CHOL", "HDL", "LDLCALC", "TRIG", "CHOLHDL", "LDLDIRECT" in the last 72 hours.  Lab Results   Component Value Date   HGBA1C 6.1 05/28/2023   ------------------------------------------------------------------------------------------------------------------ Recent Labs    09/15/23 1343  TSH  2.538    Cardiac Enzymes No results for input(s): "CKMB", "TROPONINI", "MYOGLOBIN" in the last 168 hours.  Invalid input(s): "CK" ------------------------------------------------------------------------------------------------------------------    Component Value Date/Time   BNP 84.4 10/11/2019 0235    CBG: Recent Labs  Lab 09/17/23 0805 09/17/23 1209 09/17/23 1555 09/17/23 1944 09/18/23 0728  GLUCAP 128* 108* 150* 79 102*    Recent Results (from the past 240 hour(s))  Urine Culture     Status: Abnormal   Collection Time: 09/15/23 10:15 AM   Specimen: Urine, Random  Result Value Ref Range Status   Specimen Description URINE, RANDOM  Final   Special Requests   Final    NONE Reflexed from [id number] Performed at [facility], [address], [place], [place] [address]    Culture >=100,000 COLONIES/mL ENTEROBACTER CLOACAE (A)  Final   Report Status 09/17/2023 FINAL  Final   Organism ID, Bacteria ENTEROBACTER CLOACAE (A)  Final      Susceptibility   Enterobacter cloacae - MIC*    CEFEPIME <=0.12 SENSITIVE Sensitive     CIPROFLOXACIN <=0.25 SENSITIVE Sensitive     GENTAMICIN <=1 SENSITIVE Sensitive     IMIPENEM <=0.25 SENSITIVE Sensitive     NITROFURANTOIN <=16 SENSITIVE Sensitive     TRIMETH/SULFA <=20 SENSITIVE Sensitive     PIP/TAZO <=4 SENSITIVE Sensitive ug/mL    * >=100,000 COLONIES/mL ENTEROBACTER CLOACAE     Radiology Studies: No results found.   Pamella Pert, MD, PhD [facility]  Between 7 am - 7 pm I am available, please contact me via Amion (for emergencies) or Securechat (non urgent messages)  Between 7 pm - 7 am I am not available, please contact night coverage MD/APP via Amion

## 2023-09-19 DIAGNOSIS — N1832 Chronic kidney disease, stage 3b: Secondary | ICD-10-CM | POA: Diagnosis not present

## 2023-09-19 DIAGNOSIS — N179 Acute kidney failure, unspecified: Secondary | ICD-10-CM | POA: Diagnosis not present

## 2023-09-19 LAB — PHOSPHORUS: Phosphorus: 3.6 mg/dL (ref 2.5–4.6)

## 2023-09-19 LAB — CBC
HCT: 27 % — ABNORMAL LOW (ref 39.0–52.0)
Hemoglobin: 8.7 g/dL — ABNORMAL LOW (ref 13.0–17.0)
MCH: 26.9 pg (ref 26.0–34.0)
MCHC: 32.2 g/dL (ref 30.0–36.0)
MCV: 83.6 fL (ref 80.0–100.0)
Platelets: 242 10*3/uL (ref 150–400)
RBC: 3.23 MIL/uL — ABNORMAL LOW (ref 4.22–5.81)
RDW: 15.4 % (ref 11.5–15.5)
WBC: 6 10*3/uL (ref 4.0–10.5)
nRBC: 0 % (ref 0.0–0.2)

## 2023-09-19 LAB — MAGNESIUM: Magnesium: 1.8 mg/dL (ref 1.7–2.4)

## 2023-09-19 LAB — COMPREHENSIVE METABOLIC PANEL
ALT: 11 U/L (ref 0–44)
AST: 14 U/L — ABNORMAL LOW (ref 15–41)
Albumin: 2.8 g/dL — ABNORMAL LOW (ref 3.5–5.0)
Alkaline Phosphatase: 41 U/L (ref 38–126)
Anion gap: 11 (ref 5–15)
BUN: 41 mg/dL — ABNORMAL HIGH (ref 8–23)
CO2: 21 mmol/L — ABNORMAL LOW (ref 22–32)
Calcium: 9 mg/dL (ref 8.9–10.3)
Chloride: 108 mmol/L (ref 98–111)
Creatinine, Ser: 3.02 mg/dL — ABNORMAL HIGH (ref 0.61–1.24)
GFR, Estimated: 20 mL/min — ABNORMAL LOW (ref >60)
Glucose, Bld: 127 mg/dL — ABNORMAL HIGH (ref 70–99)
Potassium: 3.9 mmol/L (ref 3.5–5.1)
Sodium: 140 mmol/L (ref 135–145)
Total Bilirubin: 0.4 mg/dL (ref <1.2)
Total Protein: 6.8 g/dL (ref 6.5–8.1)

## 2023-09-19 LAB — GLUCOSE, CAPILLARY: Glucose-Capillary: 120 mg/dL — ABNORMAL HIGH (ref 70–99)

## 2023-09-19 NOTE — Care Management Important Message (Signed)
Important Message  Patient Details  Name: Frank Carlson MRN: [id number] Date of Birth: [birth year]   Important Message Given:  Yes - Medicare IM Patient left prior to IM delivery will mail a copy to the patient home address.     Jentry Mcqueary 09/19/2023, 3:04 PM

## 2023-09-19 NOTE — Discharge Summary (Signed)
Physician Discharge Summary  Frank Carlson ZOX:[id number] DOB: [birth year] DOA: 09/15/2023  PCP: Karie Schwalbe, MD  Admit date: 09/15/2023 Discharge date: 09/19/2023  Admitted From: home Disposition:  home  Recommendations for Outpatient Follow-up:  Follow up with PCP in 1-2 weeks Follow up with Urology as an outpatient as scheduled  Home Health: none Equipment/Devices: none  Discharge Condition: stable CODE STATUS: Full code Diet Orders (From admission, onward)     Start     Ordered   09/17/23 0857  Diet Carb Modified Fluid consistency: Thin; Room service appropriate? Yes  Diet effective now       Comments: Low potassium diet  Question Answer Comment  Diet-HS Snack? Nothing   Calorie Level Medium 1600-2000   Fluid consistency: Thin   Room service appropriate? Yes      09/17/23 0856            HPI: Per admitting MD, Frank Carlson is a 82 y.o. male with medical history significant of BPH who self caths 3x/day, T2DM, HLD, CKD stage 3a, MCI who presented to ED after a fall.  He states he woke up this morning and felt dizzy and nauseated. He states this got worse and he tried to get up and this made him dizzy. He walked across the room and he fell and hit his head on a drawer. He is on no anti-coagulation. He denies any chest pain, palpitations, shortness of breath or light headedness. Denies LOC. He was just dizzy. He denies any N/V/D and has been eating well at home. His wife states she has had some intermittent balance issues for about 6 months. Denies any fever/chills, vision changes/headaches, chest pain or palpitations, shortness of breath or cough, abdominal pain, N/V/D, dysuria or leg swelling. He does not smoke or drink alcohol.  Hospital Course / Discharge diagnoses: Principal Problem:   Acute renal failure superimposed on stage 3b chronic kidney disease (HCC) Active Problems:   Orthostatic hypotension   Dizziness and fall   BPH with obstruction/lower  urinary tract symptoms   Normocytic anemia   Diabetes mellitus with neurological manifestations, controlled (HCC)   Principal problem Acute kidney injury on CKD 3B -most recent creatinine at 2.2 in July 2024.  Creatinine on admission was around 3.8, had a Foley catheter placed and now improving at 3.0 and stable.  Renal ultrasound showed similar bilateral hydronephrosis as to prior ultrasound in the past. AKI improving.  Hold metformin   Active problems Enterococcus UTI-continue ciprofloxacin Orthostatic hypotension-CT of the head as well as MRI without acute intracranial pathology.  He received IV fluids with improvement.  Underwent a 2D echo which showed LVEF 55 to 60%, grade 1 DD. He is now improved BPH -with significant obstruction, had a Foley catheter placed.  Discussed with Alvera Novel, NP, will need outpatient follow-up.  Appointment has been made with Anne Fu, NP, December 10 at 2:15 PM Acute delirium-started on Seroquel while here, will not need this long term  DM 2, controlled- A1c 6.1  Sepsis ruled out   Discharge Instructions  Discharge Instructions     Ambulatory referral to Physical Therapy   Complete by: As directed    Iontophoresis - 4 mg/ml of dexamethasone: No   T.E.N.S. Unit Evaluation and Dispense as Indicated: No      Allergies as of 09/19/2023       Reactions   Bee Venom Other (See Comments)   Passed out        Medication List  STOP taking these medications    metFORMIN 500 MG tablet Commonly known as: GLUCOPHAGE       TAKE these medications    ciprofloxacin 500 MG tablet Commonly known as: CIPRO Take 1 tablet (500 mg total) by mouth daily for 4 days.   tamsulosin 0.4 MG Caps capsule Commonly known as: FLOMAX Take 0.8 mg by mouth daily.   TRUEplus Lancets 30G Misc USE TO TEST BLOOD SUGAR EVERY DAY        Follow-up Information     [facility]. Call.   Specialty:  Rehabilitation Why: Call the Outpatient Rehabilitation center and follow up regarding Outpatient therapy needs. Contact information: [address] [place] [address] [phone]        Hillery Aldo, NP Follow up.   Specialty: Nurse Practitioner Why: you have an appointment on Dec 10th at 2:15 pm Contact information: [address] [place] [address] [phone]                Consultations: none  Procedures/Studies:  ECHOCARDIOGRAM COMPLETE  Result Date: 09/15/2023    ECHOCARDIOGRAM REPORT   Patient Name:   Frank Carlson Date of Exam: 09/15/2023 Medical Rec #:  [id number]        Height:       72.0 in Accession #:    [id number]       Weight:       172.0 lb Date of Birth:  1940/11/26        BSA:          1.998 m Patient Age:    82 years         BP:           138/59 mmHg Patient Gender: M                HR:           54 bpm. Exam Location:  Inpatient Procedure: 2D Echo, Color Doppler and Cardiac Doppler Indications:    syncope  History:        Patient has no prior history of Echocardiogram examinations.                 Chronic kidney disease; Risk Factors:Dyslipidemia and Diabetes.  Sonographer:    Delcie Roch RDCS Referring Phys: [id number] Orland Mustard  Sonographer Comments: Image acquisition challenging due to respiratory motion. IMPRESSIONS  1. Left ventricular ejection fraction, by estimation, is 55 to 60%. The left ventricle has normal function. The left ventricle has no regional wall motion abnormalities. Left ventricular diastolic parameters are consistent with Grade I diastolic dysfunction (impaired relaxation).  2. Right ventricular systolic function is normal. The right ventricular size is mildly enlarged. Tricuspid regurgitation signal is inadequate for assessing PA pressure.  3. The mitral valve is degenerative. Trivial mitral valve regurgitation.  4. The aortic valve is tricuspid. Aortic valve regurgitation is mild to  moderate. Aortic valve sclerosis is present, with no evidence of aortic valve stenosis.  5. The inferior vena cava is normal in size with greater than 50% respiratory variability, suggesting right atrial pressure of 3 mmHg. Comparison(s): No prior Echocardiogram. FINDINGS  Left Ventricle: Left ventricular ejection fraction, by estimation, is 55 to 60%. The left ventricle has normal function. The left ventricle has no regional wall motion abnormalities. The left ventricular internal cavity size was normal in size. There is  borderline left ventricular hypertrophy. Left ventricular diastolic parameters are consistent with  Grade I diastolic dysfunction (impaired relaxation). Right Ventricle: The right ventricular size is mildly enlarged. No increase in right ventricular wall thickness. Right ventricular systolic function is normal. Tricuspid regurgitation signal is inadequate for assessing PA pressure. Left Atrium: Left atrial size was normal in size. Right Atrium: Right atrial size was normal in size. Pericardium: There is no evidence of pericardial effusion. Mitral Valve: The mitral valve is degenerative in appearance. There is mild thickening of the mitral valve leaflet(s). Trivial mitral valve regurgitation. Tricuspid Valve: The tricuspid valve is grossly normal. Tricuspid valve regurgitation is trivial. Aortic Valve: The aortic valve is tricuspid. There is mild aortic valve annular calcification. Aortic valve regurgitation is mild to moderate. Aortic valve sclerosis is present, with no evidence of aortic valve stenosis. Pulmonic Valve: The pulmonic valve was grossly normal. Pulmonic valve regurgitation is trivial. Aorta: The aortic root and ascending aorta are structurally normal, with no evidence of dilitation. Venous: The inferior vena cava is normal in size with greater than 50% respiratory variability, suggesting right atrial pressure of 3 mmHg. IAS/Shunts: No atrial level shunt detected by color flow Doppler.   LEFT VENTRICLE PLAX 2D LVIDd:         4.90 cm      Diastology LVIDs:         2.90 cm      LV e' medial:    8.16 cm/s LV PW:         0.80 cm      LV E/e' medial:  9.4 LV IVS:        1.10 cm      LV e' lateral:   8.59 cm/s                             LV E/e' lateral: 8.9  LV Volumes (MOD) LV vol d, MOD A2C: 87.3 ml LV vol d, MOD A4C: 112.0 ml LV vol s, MOD A2C: 46.0 ml LV vol s, MOD A4C: 45.1 ml LV SV MOD A2C:     41.3 ml LV SV MOD A4C:     112.0 ml LV SV MOD BP:      57.7 ml RIGHT VENTRICLE             IVC RV Basal diam:  2.70 cm     IVC diam: 1.20 cm RV S prime:     15.20 cm/s TAPSE (M-mode): 2.5 cm LEFT ATRIUM         Index       RIGHT ATRIUM           Index LA diam:    3.20 cm 1.60 cm/m  RA Area:     16.30 cm                                 RA Volume:   43.70 ml  21.87 ml/m  AORTIC VALVE             PULMONIC VALVE LVOT Vmax:   75.70 cm/s  PR End Diast Vel: 1.83 msec LVOT Vmean:  49.700 cm/s LVOT VTI:    0.195 m  AORTA Ao Asc diam: 3.50 cm MITRAL VALVE MV Area (PHT): 3.12 cm    SHUNTS MV Decel Time: 243 msec    Systemic VTI: 0.20 m MV E velocity: 76.70 cm/s MV A velocity: 78.50 cm/s MV E/A ratio:  0.98 Nona Dell MD Electronically  signed by Nona Dell MD Signature Date/Time: 09/15/2023/6:36:22 PM    Final    MR BRAIN WO CONTRAST  Result Date: 09/15/2023 CLINICAL DATA:  Neuro deficit, stroke suspected. EXAM: MRI HEAD WITHOUT CONTRAST TECHNIQUE: Multiplanar, multiecho pulse sequences of the brain and surrounding structures were obtained without intravenous contrast. COMPARISON:  Same-day head CT FINDINGS: Brain: There is no acute intracranial hemorrhage, extra-axial fluid collection, or acute infarct. There is background parenchymal volume loss with prominence of the ventricular system and extra-axial CSF spaces. There is mild background chronic small-vessel ischemic change in the supratentorial white matter. A large right middle cranial fossa arachnoid cyst is unchanged, with unchanged displacement  of the surrounding brain parenchyma but no parenchymal edema. 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 The pituitary and suprasellar region are normal. There is no mass lesion. There is no mass effect or midline shift. Vascular: Normal flow voids. Skull and upper cervical spine: Normal marrow signal. Sinuses/Orbits: The paranasal sinuses are clear. Bilateral lens implants are in place. The globes and orbits are otherwise unremarkable. Other: The mastoid air cells and middle ear cavities are clear. IMPRESSION: No acute intracranial pathology. Electronically Signed   By: Lesia Hausen M.D.   On: 09/15/2023 15:32   US RENAL  Result Date: 09/15/2023 CLINICAL DATA:  Renal failure EXAM: RENAL / URINARY TRACT ULTRASOUND COMPLETE COMPARISON:  06/03/2023 FINDINGS: Right Kidney: Renal measurements: 12.3 x 4.5 x 7.0 cm = volume: 200 mL. Moderate right hydronephrosis is similar to prior. Exophytic cyst noted towards the lower pole also similar to prior. Left Kidney: Renal measurements: 13 x 0 x 6.2 x 5.6 cm = volume: 231 mL. Similar left-sided hydronephrosis. Large exophytic lower pole cyst again identified measuring up to 8.8 cm. Bladder: Bladder wall appears trabeculated scratch. Other: Prostate gland appears enlarged. IMPRESSION: 1. Similar bilateral hydronephrosis. 2. Bilateral renal cysts, stable. 3. Enlarged prostate gland with trabeculated appearance of the bladder wall raising concern for component of bladder outlet obstruction. Electronically Signed   By: Kennith Center M.D.   On: 09/15/2023 12:33   DG Chest Port 1 View  Result Date: 09/15/2023 CLINICAL DATA:  Dizziness and weakness.  Altered mental status. EXAM: PORTABLE CHEST 1 VIEW COMPARISON:  12/05/2018 FINDINGS: The heart size and mediastinal contours are within normal limits. Scarring again seen in right lung base. Several calcified granulomas again seen in the left lung. No evidence of pulmonary infiltrate or edema. No pleural  effusion seen. IMPRESSION: No active disease. Electronically Signed   By: Danae Orleans M.D.   On: 09/15/2023 09:34   CT Cervical Spine Wo Contrast  Result Date: 09/15/2023 CLINICAL DATA:  Neck trauma. EXAM: CT CERVICAL SPINE WITHOUT CONTRAST TECHNIQUE: Multidetector CT imaging of the cervical spine was performed without intravenous contrast. Multiplanar CT image reconstructions were also generated. RADIATION DOSE REDUCTION: This exam was performed according to the departmental dose-optimization program which includes automated exposure control, adjustment of the mA and/or kV according to patient size and/or use of iterative reconstruction technique. COMPARISON:  10/10/2019 FINDINGS: Alignment: No traumatic malalignment. Skull base and vertebrae: No evidence for an acute fracture Soft tissues and spinal canal: No prevertebral fluid or swelling. No visible canal hematoma. Disc levels: Loss of disc height C5-6 and C6-7 with endplate degeneration. Upper chest: Unremarkable. Other: None. IMPRESSION: Degenerative changes in the cervical spine without acute fracture or traumatic malalignment. Electronically Signed   By: Kennith Center M.D.   On: 09/15/2023 09:21   CT Head Wo Contrast  Result Date: 09/15/2023 CLINICAL DATA:  Head trauma.  12/05/2022 EXAM: CT HEAD WITHOUT CONTRAST TECHNIQUE: Contiguous axial images were obtained from the base of the skull through the vertex without intravenous contrast. RADIATION DOSE REDUCTION: This exam was performed according to the departmental dose-optimization program which includes automated exposure control, adjustment of the mA and/or kV according to patient size and/or use of iterative reconstruction technique. COMPARISON:  None Available. FINDINGS: Brain: There is no evidence for acute hemorrhage, hydrocephalus, mass lesion, or abnormal extra-axial fluid collection. No definite CT evidence for acute infarction. Diffuse loss of parenchymal volume is consistent with atrophy.  Right node cyst in the right middle cranial fossa is similar to prior. Vascular: No hyperdense vessel or unexpected calcification. Skull: No evidence for fracture. No worrisome lytic or sclerotic lesion. Stable dural calcification over the right convexity. Sinuses/Orbits: No acute finding. Other: None. IMPRESSION: Stable.  No acute intracranial abnormality. Electronically Signed   By: Kennith Center M.D.   On: 09/15/2023 09:09     Subjective: - no chest pain, shortness of breath, no abdominal pain, nausea or vomiting.   Discharge Exam: BP 125/65 (BP Location: Left Arm)   Pulse (!) 53   Temp 97.8 F (36.6 C) (Oral)   Resp 16   Ht 6' (1.829 m)   Wt 78 kg   SpO2 99%   BMI 23.32 kg/m   General: Pt is alert, awake, not in acute distress Cardiovascular: RRR, S1/S2 +, no rubs, no gallops Respiratory: CTA bilaterally, no wheezing, no rhonchi Abdominal: Soft, NT, ND, bowel sounds + Extremities: no edema, no cyanosis   The results of significant diagnostics from this hospitalization (including imaging, microbiology, ancillary and laboratory) are listed below for reference.     Microbiology: Recent Results (from the past 240 hour(s))  Urine Culture     Status: Abnormal   Collection Time: 09/15/23 10:15 AM   Specimen: Urine, Random  Result Value Ref Range Status   Specimen Description URINE, RANDOM  Final   Special Requests   Final    NONE Reflexed from [id number] Performed at [facility], [address] [place], [place] [address]    Culture >=100,000 COLONIES/mL ENTEROBACTER CLOACAE (A)  Final   Report Status 09/17/2023 FINAL  Final   Organism ID, Bacteria ENTEROBACTER CLOACAE (A)  Final      Susceptibility   Enterobacter cloacae - MIC*    CEFEPIME <=0.12 SENSITIVE Sensitive     CIPROFLOXACIN <=0.25 SENSITIVE Sensitive     GENTAMICIN <=1 SENSITIVE Sensitive     IMIPENEM <=0.25 SENSITIVE Sensitive     NITROFURANTOIN <=16 SENSITIVE Sensitive     TRIMETH/SULFA <=20 SENSITIVE  Sensitive     PIP/TAZO <=4 SENSITIVE Sensitive ug/mL    * >=100,000 COLONIES/mL ENTEROBACTER CLOACAE     Labs: Basic Metabolic Panel: Recent Labs  Lab 09/15/23 0840 09/15/23 1343 09/16/23 1144 09/17/23 0536 09/18/23 0451 09/19/23 0652  NA 140  --  142 142 140 140  K 4.8  --  4.3 5.1 4.0 3.9  CL 107  --  109 113* 112* 108  CO2 21*  --  19* 20* 21* 21*  GLUCOSE 156*  --  186* 111* 100* 127*  BUN 49*  --  41* 44* 39* 41*  CREATININE 3.78* 3.84* 3.51* 3.27* 3.08* 3.02*  CALCIUM 9.3  --  9.1 9.0 8.7* 9.0  MG  --  2.1 2.0 2.2 1.9 1.8  PHOS  --   --  3.6 3.7 3.7 3.6  Liver Function Tests: Recent Labs  Lab 09/15/23 0840 09/19/23 0652  AST 9* 14*  ALT 9 11  ALKPHOS 46 41  BILITOT 0.4 0.4  PROT 7.9 6.8  ALBUMIN 3.0* 2.8*   CBC: Recent Labs  Lab 09/15/23 0840 09/15/23 1343 09/16/23 1144 09/17/23 0536 09/18/23 0451 09/19/23 0652  WBC 7.6 6.0 6.9 6.7 6.3 6.0  NEUTROABS 5.6  --   --   --   --   --   HGB 9.0* 9.2* 9.1* 8.6* 8.3* 8.7*  HCT 28.9* 29.2* 27.8* 27.0* 26.2* 27.0*  MCV 85.8 85.4 84.0 84.4 83.7 83.6  PLT 270 271 266 256 241 242   CBG: Recent Labs  Lab 09/18/23 0728 09/18/23 1202 09/18/23 1537 09/18/23 2015 09/19/23 0731  GLUCAP 102* 168* 94 137* 120*   Hgb A1c No results for input(s): "HGBA1C" in the last 72 hours. Lipid Profile No results for input(s): "CHOL", "HDL", "LDLCALC", "TRIG", "CHOLHDL", "LDLDIRECT" in the last 72 hours. Thyroid function studies No results for input(s): "TSH", "T4TOTAL", "T3FREE", "THYROIDAB" in the last 72 hours.  Invalid input(s): "FREET3" Urinalysis    Component Value Date/Time   COLORURINE YELLOW 09/15/2023 1015   APPEARANCEUR TURBID (A) 09/15/2023 1015   LABSPEC 1.009 09/15/2023 1015   PHURINE 6.0 09/15/2023 1015   GLUCOSEU NEGATIVE 09/15/2023 1015   HGBUR SMALL (A) 09/15/2023 1015   HGBUR moderate 11/03/2009 0804   BILIRUBINUR NEGATIVE 09/15/2023 1015   BILIRUBINUR 2+ 11/16/2015 1227   KETONESUR NEGATIVE  09/15/2023 1015   PROTEINUR 100 (A) 09/15/2023 1015   UROBILINOGEN >=8.0 11/16/2015 1227   UROBILINOGEN 0.2 11/03/2009 0804   NITRITE NEGATIVE 09/15/2023 1015   LEUKOCYTESUR MODERATE (A) 09/15/2023 1015    FURTHER DISCHARGE INSTRUCTIONS:   Get Medicines reviewed and adjusted: Please take all your medications with you for your next visit with your Primary MD   Laboratory/radiological data: Please request your Primary MD to go over all hospital tests and procedure/radiological results at the follow up, please ask your Primary MD to get all Hospital records sent to his/her office.   In some cases, they will be blood work, cultures and biopsy results pending at the time of your discharge. Please request that your primary care M.D. goes through all the records of your hospital data and follows up on these results.   Also Note the following: If you experience worsening of your admission symptoms, develop shortness of breath, life threatening emergency, suicidal or homicidal thoughts you must seek medical attention immediately by calling 911 or calling your MD immediately  if symptoms less severe.   You must read complete instructions/literature along with all the possible adverse reactions/side effects for all the Medicines you take and that have been prescribed to you. Take any new Medicines after you have completely understood and accpet all the possible adverse reactions/side effects.    Do not drive when taking Pain medications or sleeping medications (Benzodaizepines)   Do not take more than prescribed Pain, Sleep and Anxiety Medications. It is not advisable to combine anxiety,sleep and pain medications without talking with your primary care practitioner   Special Instructions: If you have smoked or chewed Tobacco  in the last 2 yrs please stop smoking, stop any regular Alcohol  and or any Recreational drug use.   Wear Seat belts while driving.   Please note: You were cared for by a  hospitalist during your hospital stay. Once you are discharged, your primary care physician will handle any further medical issues. Please note that  NO REFILLS for any discharge medications will be authorized once you are discharged, as it is imperative that you return to your primary care physician (or establish a relationship with a primary care physician if you do not have one) for your post hospital discharge needs so that they can reassess your need for medications and monitor your lab values.  Time coordinating discharge: 35 minutes  SIGNED:  Pamella Pert, MD, PhD 09/19/2023, 8:47 AM

## 2023-09-19 NOTE — TOC Transition Note (Signed)
Transition of Care [facility]) - CM/SW Discharge Note   Patient Details  Name: Frank Carlson MRN: [id number] Date of Birth: 30-Aug-1941  Transition of Care [facility]) CM/SW Contact:  Janae Bridgeman, RN Phone Number: 09/19/2023, 10:41 AM   Clinical Narrative:    Cm met with the patient and wife at the bedside to discuss TOC needs to return home today.  The patient is being discharged with a indwelling foley catheter and bedside RN is providing teaching to the wife to empty and care for the catheter in the home.  Patient's wife is interested in having home health services for supportive teaching in the home through [facility] RN.    Home health orders were ordered by the attending physician for [facility] RN, PT instead of Outpatient PT services.  Medicare choice was offered to the wife and she did not have a preference for home health agency.  I called Kandee Keen, RNCM with [facility] and he accepted for home health services.  Discharge instructions were included in the AVS for the wife and AVS will be re-printed and provided to the patient/wife at bedside.         Patient Goals and CMS Choice      Discharge Placement                         Discharge Plan and Services Additional resources added to the After Visit Summary for                                       Social Determinants of Health (SDOH) Interventions SDOH Screenings   Food Insecurity: No Food Insecurity (09/15/2023)  Housing: Low Risk  (09/15/2023)  Transportation Needs: No Transportation Needs (09/15/2023)  Utilities: Not At Risk (09/15/2023)  Depression (PHQ2-9): Low Risk  (05/28/2023)  Financial Resource Strain: Low Risk  (09/22/2020)  Tobacco Use: Medium Risk (09/15/2023)     Readmission Risk Interventions    09/16/2023    4:10 PM  Readmission Risk Prevention Plan  Transportation Screening Complete  PCP or Specialist Appt within 5-7 Days Complete  Home Care Screening Complete  Medication Review (RN CM)  Complete

## 2023-09-19 NOTE — Plan of Care (Signed)
  Problem: Fluid Volume: Goal: Ability to maintain a balanced intake and output will improve Outcome: Progressing   Problem: Metabolic: Goal: Ability to maintain appropriate glucose levels will improve Outcome: Progressing   Problem: Skin Integrity: Goal: Risk for impaired skin integrity will decrease Outcome: Progressing   

## 2023-09-20 ENCOUNTER — Telehealth: Payer: Self-pay

## 2023-09-20 ENCOUNTER — Telehealth: Payer: Self-pay | Admitting: Internal Medicine

## 2023-09-20 DIAGNOSIS — N390 Urinary tract infection, site not specified: Secondary | ICD-10-CM | POA: Diagnosis not present

## 2023-09-20 DIAGNOSIS — R410 Disorientation, unspecified: Secondary | ICD-10-CM | POA: Diagnosis not present

## 2023-09-20 DIAGNOSIS — N179 Acute kidney failure, unspecified: Secondary | ICD-10-CM | POA: Diagnosis not present

## 2023-09-20 DIAGNOSIS — B952 Enterococcus as the cause of diseases classified elsewhere: Secondary | ICD-10-CM | POA: Diagnosis not present

## 2023-09-20 DIAGNOSIS — D631 Anemia in chronic kidney disease: Secondary | ICD-10-CM | POA: Diagnosis not present

## 2023-09-20 DIAGNOSIS — N1831 Chronic kidney disease, stage 3a: Secondary | ICD-10-CM | POA: Diagnosis not present

## 2023-09-20 DIAGNOSIS — E1122 Type 2 diabetes mellitus with diabetic chronic kidney disease: Secondary | ICD-10-CM | POA: Diagnosis not present

## 2023-09-20 DIAGNOSIS — N401 Enlarged prostate with lower urinary tract symptoms: Secondary | ICD-10-CM | POA: Diagnosis not present

## 2023-09-20 DIAGNOSIS — I951 Orthostatic hypotension: Secondary | ICD-10-CM | POA: Diagnosis not present

## 2023-09-20 NOTE — Transitions of Care (Post Inpatient/ED Visit) (Signed)
   09/20/2023  Name: Frank Carlson MRN: [id number] DOB: 21-Jun-1941  Today's TOC FU Call Status: Today's TOC FU Call Status:: Unsuccessful Call (1st Attempt) Unsuccessful Call (1st Attempt) Date: 09/20/23  Attempted to reach the patient regarding the most recent Inpatient/ED visit.  Follow Up Plan: Additional outreach attempts will be made to reach the patient to complete the Transitions of Care (Post Inpatient/ED visit) call.   The patient and the patient's spouse were engaged with the home health nurse. RNCM to call back later.  Deidre Ala, RN Medical illustrator [facility] [phone]

## 2023-09-20 NOTE — Telephone Encounter (Signed)
Pt's wife, Dois Davenport, called to let Dr. Alphonsus Sias know the pt is now out of the hosp. Dois Davenport stated she'll try scheduling a f/u for pt once he's a little better. Call back # [phone]

## 2023-09-23 ENCOUNTER — Other Ambulatory Visit: Payer: Self-pay | Admitting: Internal Medicine

## 2023-09-23 DIAGNOSIS — N179 Acute kidney failure, unspecified: Secondary | ICD-10-CM | POA: Diagnosis not present

## 2023-09-23 DIAGNOSIS — N390 Urinary tract infection, site not specified: Secondary | ICD-10-CM | POA: Diagnosis not present

## 2023-09-23 DIAGNOSIS — D631 Anemia in chronic kidney disease: Secondary | ICD-10-CM | POA: Diagnosis not present

## 2023-09-23 DIAGNOSIS — N1831 Chronic kidney disease, stage 3a: Secondary | ICD-10-CM | POA: Diagnosis not present

## 2023-09-23 DIAGNOSIS — I951 Orthostatic hypotension: Secondary | ICD-10-CM | POA: Diagnosis not present

## 2023-09-23 DIAGNOSIS — B952 Enterococcus as the cause of diseases classified elsewhere: Secondary | ICD-10-CM | POA: Diagnosis not present

## 2023-09-23 DIAGNOSIS — R410 Disorientation, unspecified: Secondary | ICD-10-CM | POA: Diagnosis not present

## 2023-09-23 DIAGNOSIS — N401 Enlarged prostate with lower urinary tract symptoms: Secondary | ICD-10-CM | POA: Diagnosis not present

## 2023-09-23 DIAGNOSIS — E1122 Type 2 diabetes mellitus with diabetic chronic kidney disease: Secondary | ICD-10-CM | POA: Diagnosis not present

## 2023-09-24 ENCOUNTER — Ambulatory Visit (INDEPENDENT_AMBULATORY_CARE_PROVIDER_SITE_OTHER): Payer: Medicare HMO | Admitting: Internal Medicine

## 2023-09-24 ENCOUNTER — Encounter: Payer: Self-pay | Admitting: Internal Medicine

## 2023-09-24 VITALS — BP 108/70 | HR 73 | Temp 97.8°F | Ht 71.5 in | Wt 173.0 lb

## 2023-09-24 DIAGNOSIS — N139 Obstructive and reflux uropathy, unspecified: Secondary | ICD-10-CM | POA: Diagnosis not present

## 2023-09-24 DIAGNOSIS — N184 Chronic kidney disease, stage 4 (severe): Secondary | ICD-10-CM | POA: Diagnosis not present

## 2023-09-24 DIAGNOSIS — N179 Acute kidney failure, unspecified: Secondary | ICD-10-CM | POA: Diagnosis not present

## 2023-09-24 DIAGNOSIS — R339 Retention of urine, unspecified: Secondary | ICD-10-CM | POA: Insufficient documentation

## 2023-09-24 LAB — RENAL FUNCTION PANEL
Albumin: 3.6 g/dL (ref 3.5–5.2)
BUN: 33 mg/dL — ABNORMAL HIGH (ref 6–23)
CO2: 27 meq/L (ref 19–32)
Calcium: 9.1 mg/dL (ref 8.4–10.5)
Chloride: 106 meq/L (ref 96–112)
Creatinine, Ser: 2.64 mg/dL — ABNORMAL HIGH (ref 0.40–1.50)
GFR: 21.55 mL/min — ABNORMAL LOW (ref >60.00)
Glucose, Bld: 113 mg/dL — ABNORMAL HIGH (ref 70–99)
Phosphorus: 3.5 mg/dL (ref 2.3–4.6)
Potassium: 4.4 meq/L (ref 3.5–5.1)
Sodium: 141 meq/L (ref 135–145)

## 2023-09-24 LAB — CBC
HCT: 29.9 % — ABNORMAL LOW (ref 39.0–52.0)
Hemoglobin: 10.1 g/dL — ABNORMAL LOW (ref 13.0–17.0)
MCHC: 33.8 g/dL (ref 30.0–36.0)
MCV: 85.2 fL (ref 78.0–100.0)
Platelets: 274 10*3/uL (ref 150.0–400.0)
RBC: 3.51 Mil/uL — ABNORMAL LOW (ref 4.22–5.81)
RDW: 16.6 % — ABNORMAL HIGH (ref 11.5–15.5)
WBC: 8 10*3/uL (ref 4.0–10.5)

## 2023-09-24 NOTE — Progress Notes (Signed)
Subjective:    Patient ID: Frank Carlson, male    DOB: 09-27-1941, 82 y.o.   MRN: [id number]  HPI Here with wife for hospital follow up  Seen in ER 11/3 after a fall Had dizziness and nausea Admitted to [facility] with AKI---GFR down below 15 Foley catheter was placed and creatinine did improve slightly Ultrasound showed stable bilateral hydronephrosis  Taken off metformin Given cipro for enterococcus in urine--finished now  Head CT/MRI without worrisome findings  Has urology follow up soon He and wife are having trouble getting used to catheter care  One dizzy spell the day he got home (11/7)--doing better now No N/V Did start PT at home yesterday Urine has been clear in the bag  Current Outpatient Medications on File Prior to Visit  Medication Sig Dispense Refill   tamsulosin (FLOMAX) 0.4 MG CAPS capsule Take 0.8 mg by mouth daily.     TRUE METRIX BLOOD GLUCOSE TEST test strip TEST BLOOD SUGAR EVERY DAY 100 strip 3   TRUEplus Lancets 30G MISC USE TO TEST BLOOD SUGAR EVERY DAY 100 each 3   No current facility-administered medications on file prior to visit.    Allergies  Allergen Reactions   Bee Venom Other (See Comments)    Passed out    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   CONCUSSION WITH LOC OF 30 MINUTES OR LESS 11/01/2009   Diabetes mellitus without complication (HCC)    TYPE 2   ED (erectile dysfunction)    HEARING LOSS, BILATERAL 11/01/2009   Wears hearing aids   Hyperlipidemia    JOINT STIFFNESS, HAND 10/28/2008   Pleural effusion 10/2019   Pneumonia    Type II or unspecified type diabetes mellitus with neurological manifestations, not stated as uncontrolled(250.60) 1/15   Type 2    Past Surgical History:  Procedure Laterality Date   CATARACT EXTRACTION Bilateral 07/2010   OD with IOL   COLONOSCOPY W/ POLYPECTOMY     INCISION / DRAINAGE HAND /  FINGER Right    INGUINAL HERNIA REPAIR Right 08/23/2015   Procedure: LAPAROSCOPIC RIGHT INGUINAL HERNIA REPAIR WITH MESH;  Surgeon: Axel Filler, MD;  Location: [facility] OR;  Service: General;  Laterality: Right;   INSERTION OF MESH Right 08/23/2015   Procedure: INSERTION OF MESH;  Surgeon: Axel Filler, MD;  Location: [facility] OR;  Service: General;  Laterality: Right;   IR THORACENTESIS ASP PLEURAL SPACE W/IMG GUIDE  10/15/2019   TONSILLECTOMY     VASECTOMY      Family History  Problem Relation Age of Onset   Alzheimer's disease Mother    Dementia Mother    Coronary artery disease Father    Heart disease Father    Coronary artery disease Other    Diabetes Other    Alzheimer's disease Sister    Dementia Brother    Alzheimer's disease Brother     Social History   Socioeconomic History   Marital status: Married    Spouse name: Andrey Campanile   Number of children: 3   Years of education: 16   Highest education level: Not on file  Occupational History   Occupation: Licensed conveyancer    Comment: Retired  Tobacco Use   Smoking status: Former    Current packs/day: 0.00    Average packs/day: 1.5 packs/day for 5.0 years (7.6 ttl pk-yrs)    Types: Cigarettes    Start  date: 26    Quit date: 11/25/1958    Years since quitting: 64.8    Passive exposure: Never   Smokeless tobacco: Never  Vaping Use   Vaping status: Never Used  Substance and Sexual Activity   Alcohol use: No   Drug use: No   Sexual activity: Yes    Partners: Female  Other Topics Concern   Not on file  Social History Narrative   [employer]- [employer].    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    Married '64. 3 sons- '66, '68, '71; 5 grandchildren, 1 step g-dtr.       Has living will   Wife is health care POA--then son Greig Castilla   Would accept resuscitation attempts but no prolonged ventilatory support   Probably wouldn't want prolonged tube feeds   Social Determinants of Health   Financial  Resource Strain: Low Risk  (09/22/2020)   Overall Financial Resource Strain (CARDIA)    Difficulty of Paying Living Expenses: Not hard at all  Food Insecurity: No Food Insecurity (09/15/2023)   Hunger Vital Sign    Worried About Running Out of Food in the Last Year: Never true    Ran Out of Food in the Last Year: Never true  Transportation Needs: No Transportation Needs (09/15/2023)   PRAPARE - Administrator, Civil Service (Medical): No    Lack of Transportation (Non-Medical): No  Physical Activity: Not on file  Stress: Not on file  Social Connections: Not on file  Intimate Partner Violence: Not At Risk (09/15/2023)   Humiliation, Afraid, Rape, and Kick questionnaire    Fear of Current or Ex-Partner: No    Emotionally Abused: No    Physically Abused: No    Sexually Abused: No   Review of Systems Appetite is very good Weight is down 15# from earlier this year--but may be stabilizing No chest pain or SOB Sleeps okay    Objective:   Physical Exam Constitutional:      Appearance: Normal appearance.  Cardiovascular:     Rate and Rhythm: Normal rate and regular rhythm.     Heart sounds: No murmur heard.    No gallop.  Pulmonary:     Effort: Pulmonary effort is normal.     Breath sounds: Normal breath sounds. No wheezing or rales.  Abdominal:     Palpations: Abdomen is soft.     Tenderness: There is no abdominal tenderness.  Musculoskeletal:     Cervical back: Neck supple.     Right lower leg: No edema.     Left lower leg: No edema.  Lymphadenopathy:     Cervical: No cervical adenopathy.  Neurological:     Mental Status: He is alert.            Assessment & Plan:

## 2023-09-24 NOTE — Assessment & Plan Note (Signed)
GFR in 20's----but went down to 15 Will recheck

## 2023-09-24 NOTE — Assessment & Plan Note (Signed)
Has the catheter Follow up [facility] coming up---not sure he will be candidate to try to get catheter out (will have to be very careful about that)

## 2023-09-24 NOTE — Assessment & Plan Note (Signed)
Had worsened and will need to be rechecked  Presumably this was obstructive --so the Foley should help

## 2023-09-25 DIAGNOSIS — R410 Disorientation, unspecified: Secondary | ICD-10-CM | POA: Diagnosis not present

## 2023-09-25 DIAGNOSIS — E1122 Type 2 diabetes mellitus with diabetic chronic kidney disease: Secondary | ICD-10-CM | POA: Diagnosis not present

## 2023-09-25 DIAGNOSIS — N401 Enlarged prostate with lower urinary tract symptoms: Secondary | ICD-10-CM | POA: Diagnosis not present

## 2023-09-25 DIAGNOSIS — N179 Acute kidney failure, unspecified: Secondary | ICD-10-CM | POA: Diagnosis not present

## 2023-09-25 DIAGNOSIS — D631 Anemia in chronic kidney disease: Secondary | ICD-10-CM | POA: Diagnosis not present

## 2023-09-25 DIAGNOSIS — B952 Enterococcus as the cause of diseases classified elsewhere: Secondary | ICD-10-CM | POA: Diagnosis not present

## 2023-09-25 DIAGNOSIS — N390 Urinary tract infection, site not specified: Secondary | ICD-10-CM | POA: Diagnosis not present

## 2023-09-25 DIAGNOSIS — N1831 Chronic kidney disease, stage 3a: Secondary | ICD-10-CM | POA: Diagnosis not present

## 2023-09-25 DIAGNOSIS — I951 Orthostatic hypotension: Secondary | ICD-10-CM | POA: Diagnosis not present

## 2023-09-26 DIAGNOSIS — E1122 Type 2 diabetes mellitus with diabetic chronic kidney disease: Secondary | ICD-10-CM | POA: Diagnosis not present

## 2023-09-26 DIAGNOSIS — D631 Anemia in chronic kidney disease: Secondary | ICD-10-CM | POA: Diagnosis not present

## 2023-09-26 DIAGNOSIS — N179 Acute kidney failure, unspecified: Secondary | ICD-10-CM | POA: Diagnosis not present

## 2023-09-26 DIAGNOSIS — N401 Enlarged prostate with lower urinary tract symptoms: Secondary | ICD-10-CM | POA: Diagnosis not present

## 2023-09-26 DIAGNOSIS — N1831 Chronic kidney disease, stage 3a: Secondary | ICD-10-CM | POA: Diagnosis not present

## 2023-09-26 DIAGNOSIS — N390 Urinary tract infection, site not specified: Secondary | ICD-10-CM | POA: Diagnosis not present

## 2023-09-26 DIAGNOSIS — B952 Enterococcus as the cause of diseases classified elsewhere: Secondary | ICD-10-CM | POA: Diagnosis not present

## 2023-09-26 DIAGNOSIS — I951 Orthostatic hypotension: Secondary | ICD-10-CM | POA: Diagnosis not present

## 2023-09-26 DIAGNOSIS — R410 Disorientation, unspecified: Secondary | ICD-10-CM | POA: Diagnosis not present

## 2023-09-30 ENCOUNTER — Other Ambulatory Visit: Payer: Self-pay | Admitting: Internal Medicine

## 2023-09-30 DIAGNOSIS — N179 Acute kidney failure, unspecified: Secondary | ICD-10-CM

## 2023-09-30 DIAGNOSIS — B952 Enterococcus as the cause of diseases classified elsewhere: Secondary | ICD-10-CM

## 2023-09-30 DIAGNOSIS — N401 Enlarged prostate with lower urinary tract symptoms: Secondary | ICD-10-CM

## 2023-09-30 DIAGNOSIS — E785 Hyperlipidemia, unspecified: Secondary | ICD-10-CM

## 2023-09-30 DIAGNOSIS — N138 Other obstructive and reflux uropathy: Secondary | ICD-10-CM

## 2023-09-30 DIAGNOSIS — E1122 Type 2 diabetes mellitus with diabetic chronic kidney disease: Secondary | ICD-10-CM

## 2023-09-30 DIAGNOSIS — R410 Disorientation, unspecified: Secondary | ICD-10-CM

## 2023-09-30 DIAGNOSIS — E1149 Type 2 diabetes mellitus with other diabetic neurological complication: Secondary | ICD-10-CM

## 2023-09-30 DIAGNOSIS — I951 Orthostatic hypotension: Secondary | ICD-10-CM

## 2023-09-30 DIAGNOSIS — D631 Anemia in chronic kidney disease: Secondary | ICD-10-CM

## 2023-09-30 DIAGNOSIS — N390 Urinary tract infection, site not specified: Secondary | ICD-10-CM

## 2023-09-30 DIAGNOSIS — N1831 Chronic kidney disease, stage 3a: Secondary | ICD-10-CM

## 2023-10-01 DIAGNOSIS — N401 Enlarged prostate with lower urinary tract symptoms: Secondary | ICD-10-CM | POA: Diagnosis not present

## 2023-10-01 DIAGNOSIS — N179 Acute kidney failure, unspecified: Secondary | ICD-10-CM | POA: Diagnosis not present

## 2023-10-01 DIAGNOSIS — N1831 Chronic kidney disease, stage 3a: Secondary | ICD-10-CM | POA: Diagnosis not present

## 2023-10-01 DIAGNOSIS — B952 Enterococcus as the cause of diseases classified elsewhere: Secondary | ICD-10-CM | POA: Diagnosis not present

## 2023-10-01 DIAGNOSIS — I951 Orthostatic hypotension: Secondary | ICD-10-CM | POA: Diagnosis not present

## 2023-10-01 DIAGNOSIS — D631 Anemia in chronic kidney disease: Secondary | ICD-10-CM | POA: Diagnosis not present

## 2023-10-01 DIAGNOSIS — R410 Disorientation, unspecified: Secondary | ICD-10-CM | POA: Diagnosis not present

## 2023-10-01 DIAGNOSIS — N390 Urinary tract infection, site not specified: Secondary | ICD-10-CM | POA: Diagnosis not present

## 2023-10-01 DIAGNOSIS — E1122 Type 2 diabetes mellitus with diabetic chronic kidney disease: Secondary | ICD-10-CM | POA: Diagnosis not present

## 2023-10-03 DIAGNOSIS — N390 Urinary tract infection, site not specified: Secondary | ICD-10-CM | POA: Diagnosis not present

## 2023-10-03 DIAGNOSIS — N401 Enlarged prostate with lower urinary tract symptoms: Secondary | ICD-10-CM | POA: Diagnosis not present

## 2023-10-03 DIAGNOSIS — N179 Acute kidney failure, unspecified: Secondary | ICD-10-CM | POA: Diagnosis not present

## 2023-10-03 DIAGNOSIS — R410 Disorientation, unspecified: Secondary | ICD-10-CM | POA: Diagnosis not present

## 2023-10-03 DIAGNOSIS — B952 Enterococcus as the cause of diseases classified elsewhere: Secondary | ICD-10-CM | POA: Diagnosis not present

## 2023-10-03 DIAGNOSIS — D631 Anemia in chronic kidney disease: Secondary | ICD-10-CM | POA: Diagnosis not present

## 2023-10-03 DIAGNOSIS — E1122 Type 2 diabetes mellitus with diabetic chronic kidney disease: Secondary | ICD-10-CM | POA: Diagnosis not present

## 2023-10-03 DIAGNOSIS — N1831 Chronic kidney disease, stage 3a: Secondary | ICD-10-CM | POA: Diagnosis not present

## 2023-10-03 DIAGNOSIS — I951 Orthostatic hypotension: Secondary | ICD-10-CM | POA: Diagnosis not present

## 2023-10-07 DIAGNOSIS — N179 Acute kidney failure, unspecified: Secondary | ICD-10-CM | POA: Diagnosis not present

## 2023-10-07 DIAGNOSIS — I951 Orthostatic hypotension: Secondary | ICD-10-CM | POA: Diagnosis not present

## 2023-10-07 DIAGNOSIS — N401 Enlarged prostate with lower urinary tract symptoms: Secondary | ICD-10-CM | POA: Diagnosis not present

## 2023-10-07 DIAGNOSIS — N1831 Chronic kidney disease, stage 3a: Secondary | ICD-10-CM | POA: Diagnosis not present

## 2023-10-07 DIAGNOSIS — R410 Disorientation, unspecified: Secondary | ICD-10-CM | POA: Diagnosis not present

## 2023-10-07 DIAGNOSIS — D631 Anemia in chronic kidney disease: Secondary | ICD-10-CM | POA: Diagnosis not present

## 2023-10-07 DIAGNOSIS — E1122 Type 2 diabetes mellitus with diabetic chronic kidney disease: Secondary | ICD-10-CM | POA: Diagnosis not present

## 2023-10-07 DIAGNOSIS — N390 Urinary tract infection, site not specified: Secondary | ICD-10-CM | POA: Diagnosis not present

## 2023-10-07 DIAGNOSIS — B952 Enterococcus as the cause of diseases classified elsewhere: Secondary | ICD-10-CM | POA: Diagnosis not present

## 2023-10-15 DIAGNOSIS — I951 Orthostatic hypotension: Secondary | ICD-10-CM | POA: Diagnosis not present

## 2023-10-15 DIAGNOSIS — N390 Urinary tract infection, site not specified: Secondary | ICD-10-CM | POA: Diagnosis not present

## 2023-10-15 DIAGNOSIS — N1831 Chronic kidney disease, stage 3a: Secondary | ICD-10-CM | POA: Diagnosis not present

## 2023-10-15 DIAGNOSIS — N401 Enlarged prostate with lower urinary tract symptoms: Secondary | ICD-10-CM | POA: Diagnosis not present

## 2023-10-15 DIAGNOSIS — B952 Enterococcus as the cause of diseases classified elsewhere: Secondary | ICD-10-CM | POA: Diagnosis not present

## 2023-10-15 DIAGNOSIS — R410 Disorientation, unspecified: Secondary | ICD-10-CM | POA: Diagnosis not present

## 2023-10-15 DIAGNOSIS — N179 Acute kidney failure, unspecified: Secondary | ICD-10-CM | POA: Diagnosis not present

## 2023-10-15 DIAGNOSIS — D631 Anemia in chronic kidney disease: Secondary | ICD-10-CM | POA: Diagnosis not present

## 2023-10-15 DIAGNOSIS — E1122 Type 2 diabetes mellitus with diabetic chronic kidney disease: Secondary | ICD-10-CM | POA: Diagnosis not present

## 2023-10-22 DIAGNOSIS — R338 Other retention of urine: Secondary | ICD-10-CM | POA: Diagnosis not present

## 2023-10-22 DIAGNOSIS — N130 Hydronephrosis with ureteropelvic junction obstruction: Secondary | ICD-10-CM | POA: Diagnosis not present

## 2023-10-23 DIAGNOSIS — D631 Anemia in chronic kidney disease: Secondary | ICD-10-CM | POA: Diagnosis not present

## 2023-10-23 DIAGNOSIS — I951 Orthostatic hypotension: Secondary | ICD-10-CM | POA: Diagnosis not present

## 2023-10-23 DIAGNOSIS — B952 Enterococcus as the cause of diseases classified elsewhere: Secondary | ICD-10-CM | POA: Diagnosis not present

## 2023-10-23 DIAGNOSIS — N1831 Chronic kidney disease, stage 3a: Secondary | ICD-10-CM | POA: Diagnosis not present

## 2023-10-23 DIAGNOSIS — N401 Enlarged prostate with lower urinary tract symptoms: Secondary | ICD-10-CM | POA: Diagnosis not present

## 2023-10-23 DIAGNOSIS — N179 Acute kidney failure, unspecified: Secondary | ICD-10-CM | POA: Diagnosis not present

## 2023-10-23 DIAGNOSIS — N390 Urinary tract infection, site not specified: Secondary | ICD-10-CM | POA: Diagnosis not present

## 2023-10-23 DIAGNOSIS — R410 Disorientation, unspecified: Secondary | ICD-10-CM | POA: Diagnosis not present

## 2023-10-23 DIAGNOSIS — E1122 Type 2 diabetes mellitus with diabetic chronic kidney disease: Secondary | ICD-10-CM | POA: Diagnosis not present

## 2023-10-24 ENCOUNTER — Encounter: Payer: Self-pay | Admitting: Internal Medicine

## 2023-10-24 ENCOUNTER — Ambulatory Visit (INDEPENDENT_AMBULATORY_CARE_PROVIDER_SITE_OTHER): Payer: Medicare HMO | Admitting: Internal Medicine

## 2023-10-24 VITALS — BP 98/66 | HR 48 | Temp 98.5°F | Ht 71.5 in | Wt 178.0 lb

## 2023-10-24 DIAGNOSIS — R339 Retention of urine, unspecified: Secondary | ICD-10-CM | POA: Diagnosis not present

## 2023-10-24 DIAGNOSIS — N184 Chronic kidney disease, stage 4 (severe): Secondary | ICD-10-CM | POA: Diagnosis not present

## 2023-10-24 DIAGNOSIS — E1122 Type 2 diabetes mellitus with diabetic chronic kidney disease: Secondary | ICD-10-CM

## 2023-10-24 NOTE — Assessment & Plan Note (Signed)
Checks sugars daily Mostly under 130 No Rx now

## 2023-10-24 NOTE — Assessment & Plan Note (Signed)
Doing better since having Foley Chronic intermittent catheterization was not a realistic expectation Managed by urology

## 2023-10-24 NOTE — Progress Notes (Signed)
Subjective:    Patient ID: Frank Carlson, male    DOB: 11/28/1940, 82 y.o.   MRN: [id number]  HPI Here with wife for follow up  Recent urology visit Will be keeping Foley They are more comfortable with the management of this Had trouble with leg bag though  Appetite is good Has regained some of the lost weight--5#  Energy levels are okay No chest pain or SOB  Last GFRs stable in 20's  Current Outpatient Medications on File Prior to Visit  Medication Sig Dispense Refill   tamsulosin (FLOMAX) 0.4 MG CAPS capsule Take 0.8 mg by mouth daily.     TRUE METRIX BLOOD GLUCOSE TEST test strip TEST BLOOD SUGAR EVERY DAY 100 strip 3   TRUEplus Lancets 30G MISC USE TO TEST BLOOD SUGAR EVERY DAY 100 each 3   No current facility-administered medications on file prior to visit.    Allergies  Allergen Reactions   Bee Venom Other (See Comments)    Passed out    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   CONCUSSION WITH LOC OF 30 MINUTES OR LESS 11/01/2009   Diabetes mellitus without complication (HCC)    TYPE 2   ED (erectile dysfunction)    HEARING LOSS, BILATERAL 11/01/2009   Wears hearing aids   Hyperlipidemia    JOINT STIFFNESS, HAND 10/28/2008   Pleural effusion 10/2019   Pneumonia    Type II or unspecified type diabetes mellitus with neurological manifestations, not stated as uncontrolled(250.60) 1/15   Type 2    Past Surgical History:  Procedure Laterality Date   CATARACT EXTRACTION Bilateral 07/2010   OD with IOL   COLONOSCOPY W/ POLYPECTOMY     INCISION / DRAINAGE HAND / FINGER Right    INGUINAL HERNIA REPAIR Right 08/23/2015   Procedure: LAPAROSCOPIC RIGHT INGUINAL HERNIA REPAIR WITH MESH;  Surgeon: Axel Filler, MD;  Location: [facility] OR;  Service: General;  Laterality: Right;   INSERTION OF MESH Right 08/23/2015   Procedure: INSERTION OF MESH;  Surgeon: Axel Filler, MD;  Location: [facility];  Service: General;  Laterality: Right;   IR THORACENTESIS ASP PLEURAL SPACE W/IMG GUIDE  10/15/2019   TONSILLECTOMY     VASECTOMY      Family History  Problem Relation Age of Onset   Alzheimer's disease Mother    Dementia Mother    Coronary artery disease Father    Heart disease Father    Coronary artery disease Other    Diabetes Other    Alzheimer's disease Sister    Dementia Brother    Alzheimer's disease Brother     Social History   Socioeconomic History   Marital status: Married    Spouse name: Andrey Campanile   Number of children: 3   Years of education: 16   Highest education level: Not on file  Occupational History   Occupation: Licensed conveyancer    Comment: Retired  Tobacco Use   Smoking status: Former    Current packs/day: 0.00    Average packs/day: 1.5 packs/day for 5.0 years (7.6 ttl pk-yrs)    Types: Cigarettes    Start date: 41    Quit date: 11/25/1958    Years since quitting: 64.9    Passive exposure: Never   Smokeless tobacco: Never  Vaping Use   Vaping status: Never Used  Substance and Sexual Activity   Alcohol use: No  Drug use: No   Sexual activity: Yes    Partners: Female  Other Topics Concern   Not on file  Social History Narrative   [employer]- [employer].    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    Married '64. 3 sons- '66, '68, '71; 5 grandchildren, 1 step g-dtr.       Has living will   Wife is health care POA--then son Greig Castilla   Would accept resuscitation attempts but no prolonged ventilatory support   Probably wouldn't want prolonged tube feeds   Social Drivers of Health   Financial Resource Strain: Low Risk  (09/22/2020)   Overall Financial Resource Strain (CARDIA)    Difficulty of Paying Living Expenses: Not hard at all  Food Insecurity: No Food Insecurity (09/15/2023)   Hunger Vital Sign    Worried About Running Out of Food in the Last Year: Never true    Ran Out of Food in  the Last Year: Never true  Transportation Needs: No Transportation Needs (09/15/2023)   PRAPARE - Administrator, Civil Service (Medical): No    Lack of Transportation (Non-Medical): No  Physical Activity: Not on file  Stress: Not on file  Social Connections: Not on file  Intimate Partner Violence: Not At Risk (09/15/2023)   Humiliation, Afraid, Rape, and Kick questionnaire    Fear of Current or Ex-Partner: No    Emotionally Abused: No    Physically Abused: No    Sexually Abused: No   Review of Systems Sleeps well    Objective:   Physical Exam Constitutional:      Appearance: Normal appearance.  Cardiovascular:     Rate and Rhythm: Normal rate and regular rhythm.     Heart sounds: No murmur heard.    No gallop.  Pulmonary:     Effort: Pulmonary effort is normal.     Breath sounds: Normal breath sounds. No wheezing or rales.  Musculoskeletal:     Cervical back: Neck supple.     Right lower leg: No edema.     Left lower leg: No edema.  Lymphadenopathy:     Cervical: No cervical adenopathy.  Neurological:     Mental Status: He is alert.  Psychiatric:        Mood and Affect: Mood normal.            Assessment & Plan:

## 2023-10-24 NOTE — Assessment & Plan Note (Signed)
Will set up with nephrology

## 2023-10-31 DIAGNOSIS — R410 Disorientation, unspecified: Secondary | ICD-10-CM | POA: Diagnosis not present

## 2023-10-31 DIAGNOSIS — D631 Anemia in chronic kidney disease: Secondary | ICD-10-CM | POA: Diagnosis not present

## 2023-10-31 DIAGNOSIS — N1831 Chronic kidney disease, stage 3a: Secondary | ICD-10-CM | POA: Diagnosis not present

## 2023-10-31 DIAGNOSIS — B952 Enterococcus as the cause of diseases classified elsewhere: Secondary | ICD-10-CM | POA: Diagnosis not present

## 2023-10-31 DIAGNOSIS — N401 Enlarged prostate with lower urinary tract symptoms: Secondary | ICD-10-CM | POA: Diagnosis not present

## 2023-10-31 DIAGNOSIS — E1122 Type 2 diabetes mellitus with diabetic chronic kidney disease: Secondary | ICD-10-CM | POA: Diagnosis not present

## 2023-10-31 DIAGNOSIS — N179 Acute kidney failure, unspecified: Secondary | ICD-10-CM | POA: Diagnosis not present

## 2023-10-31 DIAGNOSIS — I951 Orthostatic hypotension: Secondary | ICD-10-CM | POA: Diagnosis not present

## 2023-10-31 DIAGNOSIS — N390 Urinary tract infection, site not specified: Secondary | ICD-10-CM | POA: Diagnosis not present

## 2023-11-14 DIAGNOSIS — E1122 Type 2 diabetes mellitus with diabetic chronic kidney disease: Secondary | ICD-10-CM | POA: Diagnosis not present

## 2023-11-14 DIAGNOSIS — N401 Enlarged prostate with lower urinary tract symptoms: Secondary | ICD-10-CM | POA: Diagnosis not present

## 2023-11-14 DIAGNOSIS — I951 Orthostatic hypotension: Secondary | ICD-10-CM | POA: Diagnosis not present

## 2023-11-14 DIAGNOSIS — N390 Urinary tract infection, site not specified: Secondary | ICD-10-CM | POA: Diagnosis not present

## 2023-11-14 DIAGNOSIS — B952 Enterococcus as the cause of diseases classified elsewhere: Secondary | ICD-10-CM | POA: Diagnosis not present

## 2023-11-14 DIAGNOSIS — N179 Acute kidney failure, unspecified: Secondary | ICD-10-CM | POA: Diagnosis not present

## 2023-11-14 DIAGNOSIS — R410 Disorientation, unspecified: Secondary | ICD-10-CM | POA: Diagnosis not present

## 2023-11-14 DIAGNOSIS — D631 Anemia in chronic kidney disease: Secondary | ICD-10-CM | POA: Diagnosis not present

## 2023-11-14 DIAGNOSIS — N1831 Chronic kidney disease, stage 3a: Secondary | ICD-10-CM | POA: Diagnosis not present

## 2023-11-21 DIAGNOSIS — Z466 Encounter for fitting and adjustment of urinary device: Secondary | ICD-10-CM | POA: Diagnosis not present

## 2023-11-21 DIAGNOSIS — N138 Other obstructive and reflux uropathy: Secondary | ICD-10-CM | POA: Diagnosis not present

## 2023-11-21 DIAGNOSIS — M47812 Spondylosis without myelopathy or radiculopathy, cervical region: Secondary | ICD-10-CM

## 2023-11-21 DIAGNOSIS — N281 Cyst of kidney, acquired: Secondary | ICD-10-CM

## 2023-11-21 DIAGNOSIS — D631 Anemia in chronic kidney disease: Secondary | ICD-10-CM | POA: Diagnosis not present

## 2023-11-21 DIAGNOSIS — N1330 Unspecified hydronephrosis: Secondary | ICD-10-CM

## 2023-11-21 DIAGNOSIS — I951 Orthostatic hypotension: Secondary | ICD-10-CM | POA: Diagnosis not present

## 2023-11-21 DIAGNOSIS — N1831 Chronic kidney disease, stage 3a: Secondary | ICD-10-CM | POA: Diagnosis not present

## 2023-11-21 DIAGNOSIS — E1122 Type 2 diabetes mellitus with diabetic chronic kidney disease: Secondary | ICD-10-CM | POA: Diagnosis not present

## 2023-11-21 DIAGNOSIS — N401 Enlarged prostate with lower urinary tract symptoms: Secondary | ICD-10-CM | POA: Diagnosis not present

## 2023-11-21 DIAGNOSIS — E1149 Type 2 diabetes mellitus with other diabetic neurological complication: Secondary | ICD-10-CM | POA: Diagnosis not present

## 2023-11-21 DIAGNOSIS — E785 Hyperlipidemia, unspecified: Secondary | ICD-10-CM | POA: Diagnosis not present

## 2023-11-22 DIAGNOSIS — E1122 Type 2 diabetes mellitus with diabetic chronic kidney disease: Secondary | ICD-10-CM | POA: Diagnosis not present

## 2023-11-22 DIAGNOSIS — D631 Anemia in chronic kidney disease: Secondary | ICD-10-CM | POA: Diagnosis not present

## 2023-11-22 DIAGNOSIS — N401 Enlarged prostate with lower urinary tract symptoms: Secondary | ICD-10-CM | POA: Diagnosis not present

## 2023-11-22 DIAGNOSIS — R338 Other retention of urine: Secondary | ICD-10-CM | POA: Diagnosis not present

## 2023-11-22 DIAGNOSIS — E1149 Type 2 diabetes mellitus with other diabetic neurological complication: Secondary | ICD-10-CM | POA: Diagnosis not present

## 2023-11-22 DIAGNOSIS — Z466 Encounter for fitting and adjustment of urinary device: Secondary | ICD-10-CM | POA: Diagnosis not present

## 2023-11-22 DIAGNOSIS — N1831 Chronic kidney disease, stage 3a: Secondary | ICD-10-CM | POA: Diagnosis not present

## 2023-11-22 DIAGNOSIS — E785 Hyperlipidemia, unspecified: Secondary | ICD-10-CM | POA: Diagnosis not present

## 2023-11-22 DIAGNOSIS — N138 Other obstructive and reflux uropathy: Secondary | ICD-10-CM | POA: Diagnosis not present

## 2023-11-22 DIAGNOSIS — I951 Orthostatic hypotension: Secondary | ICD-10-CM | POA: Diagnosis not present

## 2023-12-06 DIAGNOSIS — N401 Enlarged prostate with lower urinary tract symptoms: Secondary | ICD-10-CM | POA: Diagnosis not present

## 2023-12-06 DIAGNOSIS — E1122 Type 2 diabetes mellitus with diabetic chronic kidney disease: Secondary | ICD-10-CM | POA: Diagnosis not present

## 2023-12-06 DIAGNOSIS — N1831 Chronic kidney disease, stage 3a: Secondary | ICD-10-CM | POA: Diagnosis not present

## 2023-12-06 DIAGNOSIS — E1149 Type 2 diabetes mellitus with other diabetic neurological complication: Secondary | ICD-10-CM | POA: Diagnosis not present

## 2023-12-06 DIAGNOSIS — I951 Orthostatic hypotension: Secondary | ICD-10-CM | POA: Diagnosis not present

## 2023-12-06 DIAGNOSIS — D631 Anemia in chronic kidney disease: Secondary | ICD-10-CM | POA: Diagnosis not present

## 2023-12-06 DIAGNOSIS — N138 Other obstructive and reflux uropathy: Secondary | ICD-10-CM | POA: Diagnosis not present

## 2023-12-06 DIAGNOSIS — E785 Hyperlipidemia, unspecified: Secondary | ICD-10-CM | POA: Diagnosis not present

## 2023-12-06 DIAGNOSIS — Z466 Encounter for fitting and adjustment of urinary device: Secondary | ICD-10-CM | POA: Diagnosis not present

## 2023-12-12 DIAGNOSIS — D631 Anemia in chronic kidney disease: Secondary | ICD-10-CM | POA: Diagnosis not present

## 2023-12-12 DIAGNOSIS — Z466 Encounter for fitting and adjustment of urinary device: Secondary | ICD-10-CM | POA: Diagnosis not present

## 2023-12-12 DIAGNOSIS — E785 Hyperlipidemia, unspecified: Secondary | ICD-10-CM | POA: Diagnosis not present

## 2023-12-12 DIAGNOSIS — I951 Orthostatic hypotension: Secondary | ICD-10-CM | POA: Diagnosis not present

## 2023-12-12 DIAGNOSIS — N138 Other obstructive and reflux uropathy: Secondary | ICD-10-CM | POA: Diagnosis not present

## 2023-12-12 DIAGNOSIS — E1122 Type 2 diabetes mellitus with diabetic chronic kidney disease: Secondary | ICD-10-CM | POA: Diagnosis not present

## 2023-12-12 DIAGNOSIS — N401 Enlarged prostate with lower urinary tract symptoms: Secondary | ICD-10-CM | POA: Diagnosis not present

## 2023-12-12 DIAGNOSIS — N1831 Chronic kidney disease, stage 3a: Secondary | ICD-10-CM | POA: Diagnosis not present

## 2023-12-12 DIAGNOSIS — E1149 Type 2 diabetes mellitus with other diabetic neurological complication: Secondary | ICD-10-CM | POA: Diagnosis not present

## 2023-12-27 DIAGNOSIS — R338 Other retention of urine: Secondary | ICD-10-CM | POA: Diagnosis not present

## 2024-01-03 ENCOUNTER — Emergency Department (HOSPITAL_COMMUNITY)
Admission: EM | Admit: 2024-01-03 | Discharge: 2024-01-03 | Disposition: A | Discharge status: Home / Self Care | Payer: Medicare HMO | Attending: Emergency Medicine | Admitting: Emergency Medicine

## 2024-01-03 ENCOUNTER — Other Ambulatory Visit: Payer: Self-pay

## 2024-01-03 DIAGNOSIS — T83098A Other mechanical complication of other indwelling urethral catheter, initial encounter: Secondary | ICD-10-CM | POA: Insufficient documentation

## 2024-01-03 DIAGNOSIS — R1030 Lower abdominal pain, unspecified: Secondary | ICD-10-CM | POA: Diagnosis not present

## 2024-01-03 DIAGNOSIS — T83091A Other mechanical complication of indwelling urethral catheter, initial encounter: Secondary | ICD-10-CM

## 2024-01-03 DIAGNOSIS — Y69 Unspecified misadventure during surgical and medical care: Secondary | ICD-10-CM | POA: Diagnosis not present

## 2024-01-03 DIAGNOSIS — E119 Type 2 diabetes mellitus without complications: Secondary | ICD-10-CM | POA: Diagnosis not present

## 2024-01-03 LAB — URINALYSIS, W/ REFLEX TO CULTURE (INFECTION SUSPECTED)
Bilirubin Urine: NEGATIVE
Glucose, UA: NEGATIVE mg/dL
Ketones, ur: NEGATIVE mg/dL
Nitrite: NEGATIVE
Protein, ur: 100 mg/dL — AB
Specific Gravity, Urine: 1.01 (ref 1.005–1.030)
WBC, UA: >50 WBC/hpf (ref 0–5)
pH: 7 (ref 5.0–8.0)

## 2024-01-03 LAB — BASIC METABOLIC PANEL
Anion gap: 9 (ref 5–15)
BUN: 41 mg/dL — ABNORMAL HIGH (ref 8–23)
CO2: 22 mmol/L (ref 22–32)
Calcium: 8.9 mg/dL (ref 8.9–10.3)
Chloride: 108 mmol/L (ref 98–111)
Creatinine, Ser: 2.78 mg/dL — ABNORMAL HIGH (ref 0.61–1.24)
GFR, Estimated: 22 mL/min — ABNORMAL LOW (ref >60)
Glucose, Bld: 188 mg/dL — ABNORMAL HIGH (ref 70–99)
Potassium: 4.5 mmol/L (ref 3.5–5.1)
Sodium: 139 mmol/L (ref 135–145)

## 2024-01-03 LAB — CBC
HCT: 33.8 % — ABNORMAL LOW (ref 39.0–52.0)
Hemoglobin: 10.9 g/dL — ABNORMAL LOW (ref 13.0–17.0)
MCH: 28.7 pg (ref 26.0–34.0)
MCHC: 32.2 g/dL (ref 30.0–36.0)
MCV: 88.9 fL (ref 80.0–100.0)
Platelets: 167 10*3/uL (ref 150–400)
RBC: 3.8 MIL/uL — ABNORMAL LOW (ref 4.22–5.81)
RDW: 13.8 % (ref 11.5–15.5)
WBC: 8.1 10*3/uL (ref 4.0–10.5)
nRBC: 0 % (ref 0.0–0.2)

## 2024-01-03 NOTE — Discharge Instructions (Signed)
 Your catheter has been exchanged.  Follow-up with your Urologist as needed.

## 2024-01-03 NOTE — ED Triage Notes (Addendum)
 Pt. Stated, I have a catheter because Im having kidney failure. Ive not had anything to come out for 2 days now.  They injected some type of jelly compound so Im not really sure.My bag has been empty for 3 days.

## 2024-01-03 NOTE — ED Provider Notes (Signed)
 [place] EMERGENCY DEPARTMENT AT [facility] Provider Note   CSN: [id number] Arrival date & time: 01/03/24  4098     History  Chief Complaint  Patient presents with   catheter blockage    Frank Carlson is a 83 y.o. male.  HPI Patient has a chronic Foley catheter.  Reportedly over the last week has had minimal urine output.  Somewhat difficult to get time from the history however.  States that the catheter was changed likely last Friday.  States that he does not know exactly was done but states after the catheter was then the nurse injected something has had little output since.  Unsure if it was into the catheter or potentially even around the catheter.  Potentially could even be inflating the balloon.  Does feel somewhat like he has to urinate.  States he has strain did have a little bit a urine come out.   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   CONCUSSION WITH LOC OF 30 MINUTES OR LESS 11/01/2009   Diabetes mellitus without complication (HCC)    TYPE 2   ED (erectile dysfunction)    HEARING LOSS, BILATERAL 11/01/2009   Wears hearing aids   Hyperlipidemia    JOINT STIFFNESS, HAND 10/28/2008   Pleural effusion 10/2019   Pneumonia    Type II or unspecified type diabetes mellitus with neurological manifestations, not stated as uncontrolled(250.60) 1/15   Type 2    Home Medications Prior to Admission medications   Medication Sig Start Date End Date Taking? Authorizing Provider  tamsulosin (FLOMAX) 0.4 MG CAPS capsule Take 0.4 mg by mouth every 12 (twelve) hours. 10/09/22  Yes [provider]      Allergies    Bee venom    Review of Systems   Review of Systems  Physical Exam Updated Vital Signs BP (!) 153/90   Pulse 75   Temp 97.6 F (36.4 C)   Resp 17   Ht 6' (1.829 m)   Wt 75.3 kg   SpO2 98%   BMI 22.51 kg/m  Physical Exam Vitals  and nursing note reviewed.  Abdominal:     Comments: Mild suprapubic tenderness without large mass.  Genitourinary:    Comments: Foley catheter in place.  Does have urine in the brief that he is wearing over the catheter. Skin:    General: Skin is warm.  Neurological:     Mental Status: He is alert.     ED Results / Procedures / Treatments   Labs (all labs ordered are listed, but only abnormal results are displayed) Labs Reviewed  BASIC METABOLIC PANEL - Abnormal; Notable for the following components:      Result Value   Glucose, Bld 188 (*)    BUN 41 (*)    Creatinine, Ser 2.78 (*)    GFR, Estimated 22 (*)    All other components within normal limits  CBC - Abnormal; Notable for the following components:   RBC 3.80 (*)    Hemoglobin 10.9 (*)    HCT 33.8 (*)    All other components within normal limits  URINALYSIS, W/ REFLEX TO CULTURE (INFECTION SUSPECTED) - Abnormal; Notable for the following components:   APPearance CLOUDY (*)    Hgb urine dipstick MODERATE (*)    Protein, ur 100 (*)    Leukocytes,Ua LARGE (*)    Bacteria, UA FEW (*)  All other components within normal limits  URINE CULTURE    EKG None  Radiology No results found.  Procedures Procedures    Medications Ordered in ED Medications - No data to display  ED Course/ Medical Decision Making/ A&P                                 Medical Decision Making Amount and/or Complexity of Data Reviewed Labs: ordered.   Patient with decreased urine output with Foley catheter.  Bedside ultrasound did show fluid in the bladder with the balloon in the fluid.  Appears to be obstructed catheter.  However likely leaking around it or worsening kidney function since there is not a large amount of fluid in the bladder.  Will replace catheter.  Will also check kidney function.  Kidney function at baseline.  Does not appear to have increased significantly due to the obstruction.  Appears stable discharge  home.  No fever no elevated white count.  Urine will not be treated for infection.        Final Clinical Impression(s) / ED Diagnoses Final diagnoses:  Obstructed Foley catheter, initial encounter [facility])    Rx / DC Orders ED Discharge Orders     None         Benjiman Core, MD 01/03/24 [phone]

## 2024-01-05 LAB — URINE CULTURE: Culture: 60000 — AB

## 2024-01-06 ENCOUNTER — Telehealth (HOSPITAL_BASED_OUTPATIENT_CLINIC_OR_DEPARTMENT_OTHER): Payer: Self-pay | Admitting: *Deleted

## 2024-01-06 NOTE — Telephone Encounter (Signed)
 Post ED Visit - Positive Culture Follow-up  Culture report reviewed by antimicrobial stewardship pharmacist: [facility] []  Enzo Bi, Pharm.D. []  Celedonio Miyamoto, Pharm.D., BCPS AQ-ID []  Garvin Fila, Pharm.D., BCPS []  Georgina Pillion, Pharm.D., BCPS []  [place], [address].D., BCPS, AAHIVP []  Estella Husk, Pharm.D., BCPS, AAHIVP []  Lysle Pearl, PharmD, BCPS []  Phillips Climes, PharmD, BCPS []  Agapito Games, PharmD, BCPS []  Verlan Friends, PharmD []  Mervyn Gay, PharmD, BCPS [x]  Ivery Quale, PharmD  [facility] []  Len Childs, PharmD []  Greer Pickerel, PharmD []  Adalberto Cole, PharmD []  Perlie Gold, Rph []  Lonell Face) Jean Rosenthal, PharmD []  Earl Many, PharmD []  Junita Push, PharmD []  Dorna Leitz, PharmD []  Terrilee Files, PharmD []  Lynann Beaver, PharmD []  Keturah Barre, PharmD []  Loralee Pacas, PharmD []  Bernadene Person, PharmD   Positive urine culture No indication for treatment andd no further patient follow-up is required at this time.  Patsey Berthold 01/06/2024, 7:28 AM

## 2024-01-07 DIAGNOSIS — E785 Hyperlipidemia, unspecified: Secondary | ICD-10-CM | POA: Diagnosis not present

## 2024-01-07 DIAGNOSIS — N179 Acute kidney failure, unspecified: Secondary | ICD-10-CM | POA: Diagnosis not present

## 2024-01-07 DIAGNOSIS — E1122 Type 2 diabetes mellitus with diabetic chronic kidney disease: Secondary | ICD-10-CM | POA: Diagnosis not present

## 2024-01-07 DIAGNOSIS — N189 Chronic kidney disease, unspecified: Secondary | ICD-10-CM | POA: Diagnosis not present

## 2024-01-07 DIAGNOSIS — N400 Enlarged prostate without lower urinary tract symptoms: Secondary | ICD-10-CM | POA: Diagnosis not present

## 2024-01-08 LAB — LAB REPORT - SCANNED
Albumin, Urine POC: 195.3
Albumin/Creatinine Ratio, Urine, POC: 141
Creatinine, POC: 138.7 mg/dL
EGFR: 25

## 2024-01-24 DIAGNOSIS — R338 Other retention of urine: Secondary | ICD-10-CM | POA: Diagnosis not present

## 2024-01-27 ENCOUNTER — Encounter: Payer: Self-pay | Admitting: Internal Medicine

## 2024-01-27 ENCOUNTER — Ambulatory Visit (INDEPENDENT_AMBULATORY_CARE_PROVIDER_SITE_OTHER): Payer: Medicare HMO | Admitting: Internal Medicine

## 2024-01-27 VITALS — BP 122/72 | HR 57 | Temp 97.8°F | Ht 71.5 in | Wt 189.0 lb

## 2024-01-27 DIAGNOSIS — N138 Other obstructive and reflux uropathy: Secondary | ICD-10-CM | POA: Diagnosis not present

## 2024-01-27 DIAGNOSIS — E1122 Type 2 diabetes mellitus with diabetic chronic kidney disease: Secondary | ICD-10-CM

## 2024-01-27 DIAGNOSIS — G3184 Mild cognitive impairment, so stated: Secondary | ICD-10-CM | POA: Diagnosis not present

## 2024-01-27 DIAGNOSIS — N184 Chronic kidney disease, stage 4 (severe): Secondary | ICD-10-CM

## 2024-01-27 DIAGNOSIS — N401 Enlarged prostate with lower urinary tract symptoms: Secondary | ICD-10-CM | POA: Diagnosis not present

## 2024-01-27 LAB — POCT GLYCOSYLATED HEMOGLOBIN (HGB A1C): Hemoglobin A1C: 6.8 % — AB (ref 4.0–5.6)

## 2024-01-27 NOTE — Assessment & Plan Note (Signed)
 GFR stable Had worsening due to obstruction--but now stable again

## 2024-01-27 NOTE — Assessment & Plan Note (Signed)
 A1c up slightly 6.8% No meds No change needed

## 2024-01-27 NOTE — Assessment & Plan Note (Signed)
 May not need the tamsulosin anymore--asked them to check with urologist

## 2024-01-27 NOTE — Assessment & Plan Note (Signed)
 May have very early vascular dementia---memory seems stable now (was likely worse with the hospitalizations, etc) Son wonders about meds---I recommend holding off on aricept

## 2024-01-27 NOTE — Progress Notes (Signed)
 Subjective:    Patient ID: Frank Carlson, male    DOB: 04/18/1941, 83 y.o.   MRN: [id number]  HPI Here with wife for follow up of diabetes and other chronic health conditions  Did have nephrology visit last month GFR stable at 25  Does have monthly catheter changes at the urologist Used to using foley now---still prefers no leg bag Did have problems once after change---had obstruction and needed ER visit  Has been eating better Has regained his weight at this point  Careful about his eating Checks sugars every day---100 to 130 Mild foot numbness---no pain  Current Outpatient Medications on File Prior to Visit  Medication Sig Dispense Refill   tamsulosin (FLOMAX) 0.4 MG CAPS capsule Take 0.4 mg by mouth every 12 (twelve) hours.     No current facility-administered medications on file prior to visit.    Allergies  Allergen Reactions   Bee Venom Anaphylaxis    Passed out    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   CONCUSSION WITH LOC OF 30 MINUTES OR LESS 11/01/2009   Diabetes mellitus without complication (HCC)    TYPE 2   ED (erectile dysfunction)    HEARING LOSS, BILATERAL 11/01/2009   Wears hearing aids   Hyperlipidemia    JOINT STIFFNESS, HAND 10/28/2008   Pleural effusion 10/2019   Pneumonia    Type II or unspecified type diabetes mellitus with neurological manifestations, not stated as uncontrolled(250.60) 1/15   Type 2    Past Surgical History:  Procedure Laterality Date   CATARACT EXTRACTION Bilateral 07/2010   OD with IOL   COLONOSCOPY W/ POLYPECTOMY     INCISION / DRAINAGE HAND / FINGER Right    INGUINAL HERNIA REPAIR Right 08/23/2015   Procedure: LAPAROSCOPIC RIGHT INGUINAL HERNIA REPAIR WITH MESH;  Surgeon: Axel Filler, MD;  Location: [facility] OR;  Service: General;  Laterality: Right;   INSERTION OF MESH Right 08/23/2015   Procedure: INSERTION  OF MESH;  Surgeon: Axel Filler, MD;  Location: [facility] OR;  Service: General;  Laterality: Right;   IR THORACENTESIS ASP PLEURAL SPACE W/IMG GUIDE  10/15/2019   TONSILLECTOMY     VASECTOMY      Family History  Problem Relation Age of Onset   Alzheimer's disease Mother    Dementia Mother    Coronary artery disease Father    Heart disease Father    Coronary artery disease Other    Diabetes Other    Alzheimer's disease Sister    Dementia Brother    Alzheimer's disease Brother     Social History   Socioeconomic History   Marital status: Married    Spouse name: Andrey Campanile   Number of children: 3   Years of education: 16   Highest education level: Not on file  Occupational History   Occupation: Licensed conveyancer    Comment: Retired  Tobacco Use   Smoking status: Former    Current packs/day: 0.00    Average packs/day: 1.5 packs/day for 5.0 years (7.6 ttl pk-yrs)    Types: Cigarettes    Start date: 66    Quit date: 11/25/1958    Years since quitting: 65.2    Passive exposure: Never   Smokeless tobacco: Never  Vaping Use   Vaping status: Never Used  Substance and Sexual Activity   Alcohol use: No   Drug use: No   Sexual activity:  Yes    Partners: Female  Other Topics Concern   Not on file  Social History Narrative   [employer]- [employer].    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    Married '64. 3 sons- '66, '68, '71; 5 grandchildren, 1 step g-dtr.       Has living will   Wife is health care POA--then son Greig Castilla   Would accept resuscitation attempts but no prolonged ventilatory support   Probably wouldn't want prolonged tube feeds   Social Drivers of Health   Financial Resource Strain: Low Risk  (09/22/2020)   Overall Financial Resource Strain (CARDIA)    Difficulty of Paying Living Expenses: Not hard at all  Food Insecurity: No Food Insecurity (09/15/2023)   Hunger Vital Sign    Worried About Running Out of Food in the Last Year: Never  true    Ran Out of Food in the Last Year: Never true  Transportation Needs: No Transportation Needs (09/15/2023)   PRAPARE - Administrator, Civil Service (Medical): No    Lack of Transportation (Non-Medical): No  Physical Activity: Not on file  Stress: Not on file  Social Connections: Not on file  Intimate Partner Violence: Not At Risk (09/15/2023)   Humiliation, Afraid, Rape, and Kick questionnaire    Fear of Current or Ex-Partner: No    Emotionally Abused: No    Physically Abused: No    Sexually Abused: No   Review of Systems Sleeps well Bowels move fine     Objective:   Physical Exam Constitutional:      Appearance: Normal appearance.  Cardiovascular:     Rate and Rhythm: Normal rate and regular rhythm.     Pulses: Normal pulses.     Heart sounds: No murmur heard.    No gallop.  Pulmonary:     Effort: Pulmonary effort is normal.     Breath sounds: Normal breath sounds. No wheezing or rales.  Musculoskeletal:     Cervical back: Neck supple.     Right lower leg: No edema.     Left lower leg: No edema.  Lymphadenopathy:     Cervical: No cervical adenopathy.  Skin:    Comments: No foot lesions  Neurological:     Mental Status: He is alert.            Assessment & Plan:

## 2024-01-27 NOTE — Addendum Note (Signed)
 Addended by: Eual Fines on: 01/27/2024 11:18 AM   Modules accepted: Orders

## 2024-02-10 ENCOUNTER — Ambulatory Visit: Payer: Self-pay | Admitting: Internal Medicine

## 2024-02-10 NOTE — Telephone Encounter (Signed)
 Spoke to pt and husband. They will reach out to urology.

## 2024-02-10 NOTE — Telephone Encounter (Signed)
 Chief Complaint: Darkened urine with Foley Catheter in place Symptoms: see above Frequency: today Pertinent Negatives: Patient denies fever, back pain, abdominal pain Disposition: [] ED /[] Urgent Care (no appt availability in office) / [] Appointment(In office/virtual)/ []  [facility]/ [] Home Care/ [] Refused Recommended Disposition /[] [facility]/ [x]  Follow-up with PCP Additional Notes: Patient's wife called in stating patient has Foley Catheter in place and she noticed darkened urine this morning. Patient is not compaining of back pain, abdominal pain, and does not have fever. Patient's wife uncertain of future plan of care with who is supposed to be changing out foley catheter or monitoring it. Patient's wife states they saw Urology a few weeks ago, and saw Dr. Alphonsus Sias a few weeks ago and was told by PCP that there is nothing he can do for him at this time. Advised patient's wife to follow up with Urologist now to advise of symptom change with darkened urine and to establish a plan in place for management of Foley Catheter. Advised wife to follow up with PCP as necessary.    Reason for Disposition  [1] Pink, slightly red, or tea-colored urine lasts > 24 hours AND [2] not cleared by increased fluid intake AND [3] no recent prostate or bladder surgery  Answer Assessment - Initial Assessment Questions 1. SYMPTOMS: "What symptoms are you concerned about?"     Darkened color 2. ONSET:  "When did the symptoms start?"     This morning 3. FEVER: "Is there a fever?" If Yes, ask: "What is the temperature, how was it measured, and when did it start?"     No 4. ABDOMEN PAIN: "Is there any abdomen pain?" (e.g., Scale 1-10; or mild, moderate, severe)     No 5. URINE COLOR: "What color is the urine?"  "Is there blood present in the urine?" (e.g., clear, yellow, cloudy, tea-colored, blood streaks, bright red)     No blood - darkened a little brown 6. URINE AMOUNT: "When did you last  empty the urine from the collection bag?" "How much urine was in the bag at that time?" How much urine is in the collection bag now?"     Same amount 7. INSERTION: "How long have you (they) had the catheter?"     01/24/2024 8. OTHER SYMPTOMS: "Are there any other symptoms?" (e.g., abdomen swelling, back pain, bladder spasms, constipation, foul smelling urine, leaking of urine)      No symptoms other than darkened urine 9. MEDICINES: "Are you taking any medicines to treat urinary problems?" (e.g., antibiotics for a urinary tract infection, medicines to treat bladder spasms)      Patient is not on any medications  Protocols used: Urinary Catheter (e.g., Foley) Symptoms and Questions-A-AH

## 2024-02-10 NOTE — Telephone Encounter (Signed)
 This RN made first attempt to triage patient. No answer, left a message. Will route for additional attempts.  Copied from CRM [id number]. Topic: Clinical - Medication Question >> Feb 10, 2024  8:32 AM Saverio Danker wrote: Reason for CRM: Patient wife is calling because she thinks her husband has a UTI. He has a catheter in place. She wanting to know if there is any medication he can take to help

## 2024-02-14 DIAGNOSIS — R338 Other retention of urine: Secondary | ICD-10-CM | POA: Diagnosis not present

## 2024-03-16 DIAGNOSIS — R338 Other retention of urine: Secondary | ICD-10-CM | POA: Diagnosis not present

## 2024-04-13 DIAGNOSIS — R338 Other retention of urine: Secondary | ICD-10-CM | POA: Diagnosis not present

## 2024-04-21 ENCOUNTER — Ambulatory Visit (INDEPENDENT_AMBULATORY_CARE_PROVIDER_SITE_OTHER)

## 2024-04-21 VITALS — BP 112/72 | Ht 71.5 in | Wt 189.0 lb

## 2024-04-21 DIAGNOSIS — Z2821 Immunization not carried out because of patient refusal: Secondary | ICD-10-CM | POA: Diagnosis not present

## 2024-04-21 DIAGNOSIS — Z5320 Procedure and treatment not carried out because of patient's decision for unspecified reasons: Secondary | ICD-10-CM | POA: Diagnosis not present

## 2024-04-21 DIAGNOSIS — Z0000 Encounter for general adult medical examination without abnormal findings: Secondary | ICD-10-CM

## 2024-04-21 NOTE — Progress Notes (Signed)
 Because this visit was a virtual/telehealth visit,  certain criteria was not obtained, such a blood pressure, CBG if applicable, and timed get up and go. Any medications not marked as taking were not mentioned during the medication reconciliation part of the visit. Any vitals not documented were not able to be obtained due to this being a telehealth visit or patient was unable to self-report a recent blood pressure reading due to a lack of equipment at home via telehealth. Vitals that have been documented are verbally provided by the patient.   This visit was performed by a medical professional under my direct supervision. I was immediately available for consultation/collaboration. I have reviewed and agree with the Annual Wellness Visit documentation.  Subjective:   Frank Carlson is a 83 y.o. who presents for a Medicare Wellness preventive visit.  As a reminder, Annual Wellness Visits don't include a physical exam, and some assessments may be limited, especially if this visit is performed virtually. We may recommend an in-person follow-up visit with your provider if needed.  Visit Complete: Virtual I connected with  Frank Carlson on 04/21/24 by a audio enabled telemedicine application and verified that I am speaking with the correct person using two identifiers.  Patient Location: Home  Provider Location: Home Office  I discussed the limitations of evaluation and management by telemedicine. The patient expressed understanding and agreed to proceed.  Vital Signs: Because this visit was a virtual/telehealth visit, some criteria may be missing or patient reported. Any vitals not documented were not able to be obtained and vitals that have been documented are patient reported.  VideoDeclined- This patient declined Librarian, academic. Therefore the visit was completed with audio only.  Persons Participating in Visit: Patient.  AWV Questionnaire: Yes: Patient  Medicare AWV questionnaire was completed by the patient on 04/21/2024; I have confirmed that all information answered by patient is correct and no changes since this date.  Cardiac Risk Factors include: advanced age (>44men, >4 women);male gender;diabetes mellitus;dyslipidemia     Objective:     Today's Vitals   04/21/24 1516  BP: 112/72  Weight: 189 lb (85.7 kg)  Height: 5' 11.5 (1.816 m)   Body mass index is 25.99 kg/m.     04/21/2024    3:23 PM 01/03/2024    8:26 AM 09/15/2023    5:02 PM 09/15/2023    8:05 AM 12/06/2022    2:00 PM 12/05/2022    3:51 PM 08/06/2020    1:36 PM  Advanced Directives  Does Patient Have a Medical Advance Directive? Yes Yes No No Yes No No  Type of Estate agent of [place];Living will    Healthcare Power of [place];Living will    Does patient want to make changes to medical advance directive? No - Patient declined    No - Patient declined    Copy of Healthcare Power of Attorney in Chart? Yes - validated most recent copy scanned in chart (See row information)        Would patient like information on creating a medical advance directive?   No - Guardian declined    No - Patient declined    Current Medications (verified) Outpatient Encounter Medications as of 04/21/2024  Medication Sig   tamsulosin  (FLOMAX ) 0.4 MG CAPS capsule Take 0.4 mg by mouth every 12 (twelve) hours.   No facility-administered encounter medications on file as of 04/21/2024.    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   CONCUSSION WITH LOC OF 30 MINUTES OR LESS 11/01/2009   Diabetes mellitus without complication (HCC)    TYPE 2   ED (erectile dysfunction)    HEARING LOSS, BILATERAL 11/01/2009   Wears hearing aids   Hyperlipidemia    JOINT STIFFNESS, HAND 10/28/2008   Pleural effusion 10/2019   Pneumonia     Type II or unspecified type diabetes mellitus with neurological manifestations, not stated as uncontrolled(250.60) 1/15   Type 2   Past Surgical History:  Procedure Laterality Date   CATARACT EXTRACTION Bilateral 07/2010   OD with IOL   COLONOSCOPY W/ POLYPECTOMY     INCISION / DRAINAGE HAND / FINGER Right    INGUINAL HERNIA REPAIR Right 08/23/2015   Procedure: LAPAROSCOPIC RIGHT INGUINAL HERNIA REPAIR WITH MESH;  Surgeon: Shela Derby, MD;  Location: [facility] OR;  Service: General;  Laterality: Right;   INSERTION OF MESH Right 08/23/2015   Procedure: INSERTION OF MESH;  Surgeon: Shela Derby, MD;  Location: [facility] OR;  Service: General;  Laterality: Right;   IR THORACENTESIS ASP PLEURAL SPACE W/IMG GUIDE  10/15/2019   TONSILLECTOMY     VASECTOMY     Family History  Problem Relation Age of Onset   Alzheimer's disease Mother    Dementia Mother    Coronary artery disease Father    Heart disease Father    Coronary artery disease Other    Diabetes Other    Alzheimer's disease Sister    Dementia Brother    Alzheimer's disease Brother    Social History   Socioeconomic History   Marital status: Married    Spouse name: Raynelle Callow   Number of children: 3   Years of education: 16   Highest education level: Not on file  Occupational History   Occupation: Licensed conveyancer    Comment: Retired  Tobacco Use   Smoking status: Former    Current packs/day: 0.00    Average packs/day: 1.5 packs/day for 5.0 years (7.6 ttl pk-yrs)    Types: Cigarettes    Start date: 21    Quit date: 11/25/1958    Years since quitting: 65.4    Passive exposure: Never   Smokeless tobacco: Never  Vaping Use   Vaping status: Never Used  Substance and Sexual Activity   Alcohol  use: No   Drug use: No   Sexual activity: Yes    Partners: Female  Other Topics Concern   Not on file  Social History Narrative   [employer]- [employer].    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    Married  '64. 3 sons- '66, '68, '71; 5 grandchildren, 1 step g-dtr.       Has living will   Wife is health care POA--then son Debria Fang   Would accept resuscitation attempts but no prolonged ventilatory support   Probably wouldn't want prolonged tube feeds   Social Drivers of Health   Financial Resource Strain: Low Risk  (04/21/2024)   Overall Financial Resource Strain (CARDIA)    Difficulty of Paying Living Expenses: Not hard at all  Food Insecurity: No Food Insecurity (04/21/2024)   Hunger Vital Sign    Worried About Running Out of Food in the Last Year: Never true    Ran Out of Food in the Last Year: Never true  Transportation Needs: No Transportation Needs (04/21/2024)   PRAPARE - Transportation  Lack of Transportation (Medical): No    Lack of Transportation (Non-Medical): No  Physical Activity: Insufficiently Active (04/21/2024)   Exercise Vital Sign    Days of Exercise per Week: 2 days    Minutes of Exercise per Session: 20 min  Stress: No Stress Concern Present (04/21/2024)   [employer] of Occupational Health - Occupational Stress Questionnaire    Feeling of Stress : Not at all  Social Connections: Moderately Integrated (04/21/2024)   Social Connection and Isolation Panel [NHANES]    Frequency of Communication with Friends and Family: More than three times a week    Frequency of Social Gatherings with Friends and Family: More than three times a week    Attends Religious Services: Never    Database administrator or Organizations: Yes    Attends Engineer, structural: More than 4 times per year    Marital Status: Married    Tobacco Counseling Counseling given: Not Answered    Clinical Intake:  Pre-visit preparation completed: Yes  Pain : No/denies pain     BMI - recorded: 25.99 Nutritional Status: BMI 25 -29 Overweight Nutritional Risks: None Diabetes: No  Lab Results  Component Value Date   HGBA1C 6.8 (A) 01/27/2024   HGBA1C 6.1 05/28/2023   HGBA1C 6.8  (A) 11/27/2022     How often do you need to have someone help you when you read instructions, pamphlets, or other written materials from your doctor or pharmacy?: 1 - Never  Interpreter Needed?: No  Information entered by :: [employer]   Activities of Daily Living     04/21/2024    3:21 PM 09/15/2023    5:05 PM  In your present state of health, do you have any difficulty performing the following activities:  Hearing? 0   Vision? 0   Difficulty concentrating or making decisions? 0   Walking or climbing stairs? 0   Dressing or bathing? 0   Doing errands, shopping? 0 0  Preparing Food and eating ? N   Using the Toilet? N   In the past six months, have you accidently leaked urine? N   Do you have problems with loss of bowel control? N   Managing your Medications? N   Managing your Finances? N   Housekeeping or managing your Housekeeping? N     Patient Care Team: Helaine Llanos, MD as PCP - General (Internal Medicine) [facility], [facility] (Inactive) as Pharmacist (Pharmacist) Center, [facility]  I have updated your Care Teams any recent Medical Services you may have received from other providers in the past year.     Assessment:    This is a routine wellness examination for Bowen.  Hearing/Vision screen Hearing Screening - Comments:: Patient has some hearing aids  Vision Screening - Comments:: Patient wears glasses    Goals Addressed             This Visit's Progress    Patient Stated       Patient would like to get rid of catheter        Depression Screen     04/21/2024    3:25 PM 05/28/2023   12:44 PM 05/28/2023   12:20 PM 05/23/2022   10:31 AM 05/09/2021    7:50 AM 05/03/2020    8:42 AM 05/03/2020    8:09 AM  PHQ 2/9 Scores  PHQ - 2 Score 0 0 0 0 0 0 0  PHQ- 9 Score 0  Fall Risk     04/21/2024    3:22 PM 05/28/2023   12:44 PM 05/28/2023   12:20 PM 05/23/2022   10:31 AM 05/23/2022    9:52 AM  Fall Risk   Falls in the  past year? 0 1 1 0 0  Number falls in past yr: 0 0 0  0  Injury with Fall? 0 0 0    Risk for fall due to : No Fall Risks  No Fall Risks    Follow up Falls evaluation completed  Falls evaluation completed      MEDICARE RISK AT HOME:  Medicare Risk at Home Any stairs in or around the home?: Yes If so, are there any without handrails?: No Home free of loose throw rugs in walkways, pet beds, electrical cords, etc?: Yes Adequate lighting in your home to reduce risk of falls?: Yes Life alert?: No Use of a cane, walker or w/c?: No Grab bars in the bathroom?: Yes Shower chair or bench in shower?: Yes Elevated toilet seat or a handicapped toilet?: Yes  TIMED UP AND GO:  Was the test performed?  No  Cognitive Function: 6CIT completed        04/21/2024    3:18 PM  6CIT Screen  What Year? 0 points  What month? 0 points  What time? 0 points  Count back from 20 0 points  Months in reverse 0 points  Repeat phrase 0 points  Total Score 0 points    Immunizations Immunization History  Administered Date(s) Administered   Fluad Quad(high Dose 65+) 07/23/2020   Influenza Split 08/19/2012   Influenza Whole 08/17/2008, 09/12/2009, 08/10/2010   Influenza, High Dose Seasonal PF 08/03/2017, 08/14/2018   Influenza,inj,Quad PF,6+ Mos 07/14/2013, 08/02/2014, 08/02/2015, 07/20/2016, 07/07/2019   Influenza-Unspecified 07/27/2019, 08/12/2021, 09/20/2022   PFIZER(Purple Top)SARS-COV-2 Vaccination 11/29/2019, 12/20/2019, 08/08/2020, 02/24/2021   Pfizer Covid-19 Vaccine Bivalent Booster 81yrs & up 08/04/2021, 04/02/2022   Pfizer(Comirnaty)Fall Seasonal Vaccine 12 years and older 09/20/2022   Pneumococcal Conjugate-13 11/30/2013   Pneumococcal Polysaccharide-23 11/01/2009, 04/17/2017   Td 11/01/2009   Tdap 10/10/2019, 09/15/2023   Zoster, Live 11/14/2009    Screening Tests Health Maintenance  Topic Date Due   COVID-19 Vaccine (8 - 2024-25 season) 07/14/2023   OPHTHALMOLOGY EXAM  02/07/2024    Zoster Vaccines- Shingrix (1 of 2) 05/28/2024 (Originally 02/27/1991)   FOOT EXAM  05/27/2024   INFLUENZA VACCINE  06/12/2024   HEMOGLOBIN A1C  07/29/2024   Diabetic kidney evaluation - eGFR measurement  01/07/2025   Diabetic kidney evaluation - Urine ACR  01/07/2025   Medicare Annual Wellness (AWV)  04/21/2025   DTaP/Tdap/Td (4 - Td or Tdap) 09/14/2033   Pneumonia Vaccine 62+ Years old  Completed   HPV VACCINES  Aged Out   Meningococcal B Vaccine  Aged Out    Health Maintenance  Health Maintenance Due  Topic Date Due   COVID-19 Vaccine (8 - 2024-25 season) 07/14/2023   OPHTHALMOLOGY EXAM  02/07/2024   Health Maintenance Items Addressed:patient declined health maintenance   Additional Screening:  Vision Screening: Recommended annual ophthalmology exams for early detection of glaucoma and other disorders of the eye. Would you like a referral to an eye doctor? No    Dental Screening: Recommended annual dental exams for proper oral hygiene  Community Resource Referral / Chronic Care Management: CRR required this visit?  No   CCM required this visit?  No   Plan:    I have personally reviewed and noted the following in the  patient's chart:   Medical and social history Use of alcohol , tobacco or illicit drugs  Current medications and supplements including opioid prescriptions. Patient is not currently taking opioid prescriptions. Functional ability and status Nutritional status Physical activity Advanced directives List of other physicians Hospitalizations, surgeries, and ER visits in previous 12 months Vitals Screenings to include cognitive, depression, and falls Referrals and appointments  In addition, I have reviewed and discussed with patient certain preventive protocols, quality metrics, and best practice recommendations. A written personalized care plan for preventive services as well as general preventive health recommendations were provided to  patient.   Freeda Jerry, [place]   04/21/2024   After Visit Summary: (MyChart) Due to this being a telephonic visit, the after visit summary with patients personalized plan was offered to patient via MyChart   Notes: Nothing significant to report at this time.

## 2024-04-21 NOTE — Patient Instructions (Signed)
 Mr. Frank Carlson , Thank you for taking time out of your busy schedule to complete your Annual Wellness Visit with me. I enjoyed our conversation and look forward to speaking with you again next year. I, as well as your care team,  appreciate your ongoing commitment to your health goals. Please review the following plan we discussed and let me know if I can assist you in the future. Your Game plan/ To Do List    Referrals: If you haven't heard from the office you've been referred to, please reach out to them at the phone provided.  None  Follow up Visits: Next Medicare AWV with our clinical staff: 04/22/2025 Have you seen your provider in the last 6 months (3 months if uncontrolled diabetes)? No Next Office Visit with your provider: 06/04/2024  Clinician Recommendations:  Aim for 30 minutes of exercise or brisk walking, 6-8 glasses of water , and 5 servings of fruits and vegetables each day.       This is a list of the screening recommended for you and due dates:  Health Maintenance  Topic Date Due   COVID-19 Vaccine (8 - 2024-25 season) 07/14/2023   Eye exam for diabetics  02/07/2024   Zoster (Shingles) Vaccine (1 of 2) 05/28/2024*   Complete foot exam   05/27/2024   Flu Shot  06/12/2024   Hemoglobin A1C  07/29/2024   Yearly kidney function blood test for diabetes  01/07/2025   Yearly kidney health urinalysis for diabetes  01/07/2025   Medicare Annual Wellness Visit  04/21/2025   DTaP/Tdap/Td vaccine (4 - Td or Tdap) 09/14/2033   Pneumonia Vaccine  Completed   HPV Vaccine  Aged Out   Meningitis B Vaccine  Aged Out  *Topic was postponed. The date shown is not the original due date.    Advanced directives: (In Chart) A copy of your advanced directives are scanned into your chart should your provider ever need it. Advance Care Planning is important because it:  [x]  Makes sure you receive the medical care that is consistent with your values, goals, and preferences  [x]  It provides  guidance to your family and loved ones and reduces their decisional burden about whether or not they are making the right decisions based on your wishes.  Follow the link provided in your after visit summary or read over the paperwork we have mailed to you to help you started getting your Advance Directives in place. If you need assistance in completing these, please reach out to us  so that we can help you!  See attachments for Preventive Care and Fall Prevention Tips.

## 2024-04-22 DIAGNOSIS — N2581 Secondary hyperparathyroidism of renal origin: Secondary | ICD-10-CM | POA: Diagnosis not present

## 2024-04-22 DIAGNOSIS — N400 Enlarged prostate without lower urinary tract symptoms: Secondary | ICD-10-CM | POA: Diagnosis not present

## 2024-04-22 DIAGNOSIS — N189 Chronic kidney disease, unspecified: Secondary | ICD-10-CM | POA: Diagnosis not present

## 2024-04-22 DIAGNOSIS — E1122 Type 2 diabetes mellitus with diabetic chronic kidney disease: Secondary | ICD-10-CM | POA: Diagnosis not present

## 2024-04-22 DIAGNOSIS — N139 Obstructive and reflux uropathy, unspecified: Secondary | ICD-10-CM | POA: Diagnosis not present

## 2024-04-22 DIAGNOSIS — N179 Acute kidney failure, unspecified: Secondary | ICD-10-CM | POA: Diagnosis not present

## 2024-05-05 DIAGNOSIS — R338 Other retention of urine: Secondary | ICD-10-CM | POA: Diagnosis not present

## 2024-05-05 DIAGNOSIS — N130 Hydronephrosis with ureteropelvic junction obstruction: Secondary | ICD-10-CM | POA: Diagnosis not present

## 2024-05-06 LAB — LAB REPORT - SCANNED: EGFR: 26

## 2024-05-07 ENCOUNTER — Other Ambulatory Visit: Payer: Self-pay | Admitting: Internal Medicine

## 2024-06-04 ENCOUNTER — Encounter: Admitting: Internal Medicine

## 2024-06-04 DIAGNOSIS — R338 Other retention of urine: Secondary | ICD-10-CM | POA: Diagnosis not present

## 2024-06-05 ENCOUNTER — Telehealth: Payer: Self-pay | Admitting: *Deleted

## 2024-06-05 ENCOUNTER — Encounter: Admitting: Internal Medicine

## 2024-06-05 NOTE — Telephone Encounter (Signed)
 Copied from CRM [id number]. Topic: General - Other >> Jun 05, 2024 10:12 AM Fonda T wrote: Reason for CRM: Received call from spouse, Nena, calling to inform office, patient made it home safely. States he missed his appointment due to making a wrong turn, and he got lost, and never arrived to appointment.  Unable to reschedule at time of call.  Can be reached at [phone], if need to discuss further.   Thank you

## 2024-06-08 ENCOUNTER — Ambulatory Visit: Payer: Self-pay

## 2024-06-08 NOTE — Telephone Encounter (Signed)
 FYI Only or Action Required?: FYI only for provider.  Patient was last seen in primary care on 01/27/2024 by [name], MD.  Called Nurse Triage reporting Memory Loss.  Symptoms began several weeks ago.  Interventions attempted: Nothing.  Symptoms are: gradually worsening.  Triage Disposition: See PCP Within 2 Weeks  Patient/caregiver understands and will follow disposition?: Yes Copied from CRM [id number]. Topic: Clinical - Red Word Triage >> Jun 08, 2024  3:44 PM [name] wrote: Red Word that prompted transfer to Nurse Triage: patient wife Frank Carlson calling, patient missed appt, and lost for a little bit, then got home. Frank Carlson states it getting worse at times Reason for Disposition  New or worsening memory (forgetfulness) problems  Answer Assessment - Initial Assessment Questions 1. MAIN CONCERN OR SYMPTOM:  What is your main concern right now? What questions do you have? What's the main symptom you're worried about? (e.g., confusion, memory loss)     Memory loss  2. ONSET:  When did the symptom start (or worsen)? (minutes, hours, days, weeks)     Several weeks  3. BETTER-SAME-WORSE: Are you (the patient) getting better, staying the same, or getting worse compared to the day you (they) were diagnosed or most recent hospital discharge?     Starting to get worse,   4. DIAGNOSIS: Was the dementia diagnosed by a doctor? If Yes, ask: When? (e.g., days, months, years ago)     No  5. MEDICINES: Has there been any change in medicines recently? (e.g., narcotics, antihistamines, benzodiazepines, etc.)     No  6. OTHER SYMPTOMS: Are there any other symptoms? (e.g., cough, falling, fever, pain)     No  7. SUPPORT: What type of support do you (the patient) have? Note: Document living circumstances and support (e.g., family, nursing home).     Lives with wife  Protocols used: Dementia Symptoms and Questions-A-AH

## 2024-06-08 NOTE — Telephone Encounter (Signed)
 Appointment 06/17/24 with Dr. Jimmy.

## 2024-06-08 NOTE — Telephone Encounter (Signed)
 Time for an MRI and neurology evaluation Will discuss at his visit

## 2024-06-17 ENCOUNTER — Encounter: Payer: Self-pay | Admitting: Internal Medicine

## 2024-06-17 ENCOUNTER — Ambulatory Visit: Payer: Self-pay | Admitting: Internal Medicine

## 2024-06-17 ENCOUNTER — Ambulatory Visit: Admitting: Internal Medicine

## 2024-06-17 VITALS — BP 102/68 | HR 62 | Temp 97.9°F | Ht 71.5 in | Wt 195.0 lb

## 2024-06-17 DIAGNOSIS — N184 Chronic kidney disease, stage 4 (severe): Secondary | ICD-10-CM

## 2024-06-17 DIAGNOSIS — Z0000 Encounter for general adult medical examination without abnormal findings: Secondary | ICD-10-CM

## 2024-06-17 DIAGNOSIS — F0150 Vascular dementia without behavioral disturbance: Secondary | ICD-10-CM

## 2024-06-17 DIAGNOSIS — E1122 Type 2 diabetes mellitus with diabetic chronic kidney disease: Secondary | ICD-10-CM

## 2024-06-17 DIAGNOSIS — I700 Atherosclerosis of aorta: Secondary | ICD-10-CM

## 2024-06-17 LAB — POCT GLYCOSYLATED HEMOGLOBIN (HGB A1C): Hemoglobin A1C: 6.6 % — AB (ref 4.0–5.6)

## 2024-06-17 NOTE — Assessment & Plan Note (Signed)
Followed by nephrologist 

## 2024-06-17 NOTE — Assessment & Plan Note (Signed)
 Memory loss and function have declined Will set up with neurology

## 2024-06-17 NOTE — Assessment & Plan Note (Signed)
 I have personally reviewed the Medicare Annual Wellness questionnaire and have noted 1. The patient's medical and social history 2. Their use of alcohol , tobacco or illicit drugs 3. Their current medications and supplements 4. The patient's functional ability including ADL's, fall risks, home safety risks and hearing or visual             impairment. 5. Diet and physical activities 6. Evidence for depression or mood disorders  The patients weight, height, BMI and visual acuity have been recorded in the chart I have made referrals, counseling and provided education to the patient based review of the above and I have provided the pt with a written personalized care plan for preventive services.  I have provided you with a copy of your personalized plan for preventive services. Please take the time to review along with your updated medication list.  No cancer screening due to age Discussed some light exercise Flu/COVID/RSV vaccines for this fall

## 2024-06-17 NOTE — Addendum Note (Signed)
 Addended by: KALLIE CLOTILDA SQUIBB on: 06/17/2024 12:22 PM   Modules accepted: Orders

## 2024-06-17 NOTE — Assessment & Plan Note (Signed)
 On imaging Could consider statin but will see what the neurologist thinks with the dementia

## 2024-06-17 NOTE — Assessment & Plan Note (Signed)
 A1c down slightly to 6.6% Doing well still without Rx

## 2024-06-17 NOTE — Patient Instructions (Signed)
 I recommend the flu and COVID vaccines by this fall. You should also get the one time RSV vaccine

## 2024-06-17 NOTE — Progress Notes (Signed)
 Subjective:    Patient ID: Frank Carlson, male    DOB: [birth year], 83 y.o.   MRN: [id number]  HPI Here for Medicare wellness visit and follow up of chronic health conditions Reviewed advanced directives Reviewed other doctors---Dr Upton--nephrology, Dr Herrick--urology, Dr Denyse doctor, dentist? Was in ER for urinary retention---and admitted in November. Other ER visits for Foley issues Does try to do yard work and be active Vision is okay Buyer, retail are helpful No tobacco or alcohol  No depression or anhedonia Did have 1 fall about 6 months ago--no injury. Several other falls as well--trouble remembering  Got lost coming here the last time Eventually made it home--but it took a while Has given up using the computer mostly Independent with ADLs. Wife keeping him from yard work. Wife does the instrumental ADLs (always did) Forgets where things are May repeat himself some--wife not sure  Has Foley in Replaced monthly at the urologist  Checks sugars daily Usually around 130 No foot or hand numbness or burning  No chest pain No SOB No dizziness or syncope Known aortic atherosclerosis--no statin now  GFR has been in the 20's Followed by nephrologist  Current Outpatient Medications on File Prior to Visit  Medication Sig Dispense Refill   tamsulosin  (FLOMAX ) 0.4 MG CAPS capsule Take 0.4 mg by mouth every 12 (twelve) hours.     No current facility-administered medications on file prior to visit.    Allergies  Allergen Reactions   Bee Venom Anaphylaxis    Passed out    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   CONCUSSION WITH LOC OF 30 MINUTES OR LESS 11/01/2009   Diabetes mellitus without complication (HCC)    TYPE 2   ED (erectile dysfunction)    HEARING LOSS, BILATERAL 11/01/2009   Wears hearing aids   Hyperlipidemia    JOINT STIFFNESS, HAND 10/28/2008    Pleural effusion 10/2019   Pneumonia    Type II or unspecified type diabetes mellitus with neurological manifestations, not stated as uncontrolled(250.60) 1/15   Type 2    Past Surgical History:  Procedure Laterality Date   CATARACT EXTRACTION Bilateral 07/2010   OD with IOL   COLONOSCOPY W/ POLYPECTOMY     INCISION / DRAINAGE HAND / FINGER Right    INGUINAL HERNIA REPAIR Right 08/23/2015   Procedure: LAPAROSCOPIC RIGHT INGUINAL HERNIA REPAIR WITH MESH;  Surgeon: Lynda Leos, MD;  Location: [facility] OR;  Service: General;  Laterality: Right;   INSERTION OF MESH Right 08/23/2015   Procedure: INSERTION OF MESH;  Surgeon: Lynda Leos, MD;  Location: [facility] OR;  Service: General;  Laterality: Right;   IR THORACENTESIS ASP PLEURAL SPACE W/IMG GUIDE  10/15/2019   TONSILLECTOMY     VASECTOMY      Family History  Problem Relation Age of Onset   Alzheimer's disease Mother    Dementia Mother    Coronary artery disease Father    Heart disease Father    Coronary artery disease Other    Diabetes Other    Alzheimer's disease Sister    Dementia Brother    Alzheimer's disease Brother     Social History   Socioeconomic History   Marital status: Married    Spouse name: Particia   Number of children: 3   Years of education: 16   Highest education level: Not on file  Occupational History   Occupation: Lobbyist  engineer--manager    Comment: Retired  Tobacco Use   Smoking status: Former    Current packs/day: 0.00    Average packs/day: 1.5 packs/day for 5.0 years (7.6 ttl pk-yrs)    Types: Cigarettes    Start date: 39    Quit date: 11/25/1958    Years since quitting: 65.6    Passive exposure: Never   Smokeless tobacco: Never  Vaping Use   Vaping status: Never Used  Substance and Sexual Activity   Alcohol  use: No   Drug use: No   Sexual activity: Yes    Partners: Female  Other Topics Concern   Not on file  Social History Narrative   [employer].    Work:  [employer], retired '92; [employer] until '97.    Married '64. 3 sons- '66, '68, '71; 5 grandchildren, 1 step g-dtr.       Has living will   Wife is health care POA--then son Prentice   Would accept resuscitation attempts but no prolonged ventilatory support   Probably wouldn't want prolonged tube feeds   Social Drivers of Health   Financial Resource Strain: Low Risk  (04/21/2024)   Overall Financial Resource Strain (CARDIA)    Difficulty of Paying Living Expenses: Not hard at all  Food Insecurity: No Food Insecurity (04/21/2024)   Hunger Vital Sign    Worried About Running Out of Food in the Last Year: Never true    Ran Out of Food in the Last Year: Never true  Transportation Needs: No Transportation Needs (04/21/2024)   PRAPARE - Administrator, Civil Service (Medical): No    Lack of Transportation (Non-Medical): No  Physical Activity: Insufficiently Active (04/21/2024)   Exercise Vital Sign    Days of Exercise per Week: 2 days    Minutes of Exercise per Session: 20 min  Stress: No Stress Concern Present (04/21/2024)   [employer] of Occupational Health - Occupational Stress Questionnaire    Feeling of Stress : Not at all  Social Connections: Moderately Integrated (04/21/2024)   Social Connection and Isolation Panel    Frequency of Communication with Friends and Family: More than three times a week    Frequency of Social Gatherings with Friends and Family: More than three times a week    Attends Religious Services: Never    Database administrator or Organizations: Yes    Attends Engineer, structural: More than 4 times per year    Marital Status: Married  Catering manager Violence: Not At Risk (04/21/2024)   Humiliation, Afraid, Rape, and Kick questionnaire    Fear of Current or Ex-Partner: No    Emotionally Abused: No    Physically Abused: No    Sexually Abused: No   Review of Systems Appetite is okay--eats a lot Weight stable Sleeps well Wears seat  belt Teeth are okay---due for dental visit No heartburn or dysphagia Bowels move fine--no blood Gets joint aches and in back--after activities (like outside). No Rx No suspicious skin lesion    Objective:   Physical Exam Constitutional:      Appearance: Normal appearance.  HENT:     Mouth/Throat:     Pharynx: No oropharyngeal exudate or posterior oropharyngeal erythema.  Eyes:     Conjunctiva/sclera: Conjunctivae normal.     Pupils: Pupils are equal, round, and reactive to light.  Cardiovascular:     Rate and Rhythm: Normal rate and regular rhythm.     Pulses: Normal pulses.  Heart sounds: No murmur heard.    No gallop.  Pulmonary:     Effort: Pulmonary effort is normal.     Breath sounds: Normal breath sounds. No wheezing or rales.  Abdominal:     Palpations: Abdomen is soft.     Tenderness: There is no abdominal tenderness.  Genitourinary:    Comments: Foley in place Musculoskeletal:     Cervical back: Neck supple.     Right lower leg: No edema.     Left lower leg: No edema.  Lymphadenopathy:     Cervical: No cervical adenopathy.  Skin:    Findings: No lesion or rash.  Neurological:     General: No focal deficit present.     Mental Status: He is alert.  Psychiatric:        Mood and Affect: Mood normal.        Behavior: Behavior normal.            Assessment & Plan:

## 2024-06-23 ENCOUNTER — Encounter: Payer: Self-pay | Admitting: Physician Assistant

## 2024-07-02 DIAGNOSIS — R338 Other retention of urine: Secondary | ICD-10-CM | POA: Diagnosis not present

## 2024-07-23 ENCOUNTER — Emergency Department (HOSPITAL_COMMUNITY)
Admission: EM | Admit: 2024-07-23 | Discharge: 2024-07-23 | Disposition: A | Discharge status: Home / Self Care | Attending: Emergency Medicine | Admitting: Emergency Medicine

## 2024-07-23 ENCOUNTER — Encounter (HOSPITAL_COMMUNITY): Payer: Self-pay | Admitting: *Deleted

## 2024-07-23 ENCOUNTER — Other Ambulatory Visit: Payer: Self-pay

## 2024-07-23 DIAGNOSIS — E1122 Type 2 diabetes mellitus with diabetic chronic kidney disease: Secondary | ICD-10-CM | POA: Insufficient documentation

## 2024-07-23 DIAGNOSIS — R55 Syncope and collapse: Secondary | ICD-10-CM | POA: Diagnosis present

## 2024-07-23 DIAGNOSIS — F0390 Unspecified dementia without behavioral disturbance: Secondary | ICD-10-CM | POA: Diagnosis not present

## 2024-07-23 DIAGNOSIS — N390 Urinary tract infection, site not specified: Secondary | ICD-10-CM | POA: Insufficient documentation

## 2024-07-23 DIAGNOSIS — R319 Hematuria, unspecified: Secondary | ICD-10-CM | POA: Insufficient documentation

## 2024-07-23 DIAGNOSIS — R531 Weakness: Secondary | ICD-10-CM | POA: Diagnosis present

## 2024-07-23 DIAGNOSIS — D72829 Elevated white blood cell count, unspecified: Secondary | ICD-10-CM | POA: Diagnosis not present

## 2024-07-23 DIAGNOSIS — N184 Chronic kidney disease, stage 4 (severe): Secondary | ICD-10-CM | POA: Insufficient documentation

## 2024-07-23 DIAGNOSIS — R1033 Periumbilical pain: Secondary | ICD-10-CM | POA: Diagnosis present

## 2024-07-23 DIAGNOSIS — N3001 Acute cystitis with hematuria: Secondary | ICD-10-CM

## 2024-07-23 LAB — CBC WITH DIFFERENTIAL/PLATELET
Abs Immature Granulocytes: 0.05 K/uL (ref 0.00–0.07)
Basophils Absolute: 0 K/uL (ref 0.0–0.1)
Basophils Relative: 0 %
Eosinophils Absolute: 0 K/uL (ref 0.0–0.5)
Eosinophils Relative: 0 %
HCT: 38.4 % — ABNORMAL LOW (ref 39.0–52.0)
Hemoglobin: 12.3 g/dL — ABNORMAL LOW (ref 13.0–17.0)
Immature Granulocytes: 0 %
Lymphocytes Relative: 7 %
Lymphs Abs: 1.1 K/uL (ref 0.7–4.0)
MCH: 28.5 pg (ref 26.0–34.0)
MCHC: 32 g/dL (ref 30.0–36.0)
MCV: 89.1 fL (ref 80.0–100.0)
Monocytes Absolute: 1.4 K/uL — ABNORMAL HIGH (ref 0.1–1.0)
Monocytes Relative: 9 %
Neutro Abs: 13.5 K/uL — ABNORMAL HIGH (ref 1.7–7.7)
Neutrophils Relative %: 84 %
Platelets: 172 K/uL (ref 150–400)
RBC: 4.31 MIL/uL (ref 4.22–5.81)
RDW: 13.7 % (ref 11.5–15.5)
WBC: 16 K/uL — ABNORMAL HIGH (ref 4.0–10.5)
nRBC: 0 % (ref 0.0–0.2)

## 2024-07-23 LAB — URINALYSIS, ROUTINE W REFLEX MICROSCOPIC
Bilirubin Urine: NEGATIVE
Glucose, UA: NEGATIVE mg/dL
Ketones, ur: NEGATIVE mg/dL
Nitrite: NEGATIVE
Protein, ur: 30 mg/dL — AB
Specific Gravity, Urine: 1.011 (ref 1.005–1.030)
WBC, UA: >50 WBC/hpf (ref 0–5)
pH: 7 (ref 5.0–8.0)

## 2024-07-23 LAB — BASIC METABOLIC PANEL WITH GFR
Anion gap: 12 (ref 5–15)
BUN: 31 mg/dL — ABNORMAL HIGH (ref 8–23)
CO2: 22 mmol/L (ref 22–32)
Calcium: 9.3 mg/dL (ref 8.9–10.3)
Chloride: 108 mmol/L (ref 98–111)
Creatinine, Ser: 2.24 mg/dL — ABNORMAL HIGH (ref 0.61–1.24)
GFR, Estimated: 28 mL/min — ABNORMAL LOW (ref >60)
Glucose, Bld: 137 mg/dL — ABNORMAL HIGH (ref 70–99)
Potassium: 4.3 mmol/L (ref 3.5–5.1)
Sodium: 142 mmol/L (ref 135–145)

## 2024-07-23 LAB — RESP PANEL BY RT-PCR (RSV, FLU A&B, COVID)  RVPGX2
Influenza A by PCR: NEGATIVE
Influenza B by PCR: NEGATIVE
Resp Syncytial Virus by PCR: NEGATIVE
SARS Coronavirus 2 by RT PCR: NEGATIVE

## 2024-07-23 MED ORDER — CEFDINIR 300 MG PO CAPS: 300.0000 mg | ORAL_CAPSULE | Freq: Two times a day (BID) | ORAL | 0 refills | Status: AC

## 2024-07-23 NOTE — Discharge Instructions (Addendum)
 You were seen in the ED for weakness.  Testing showed that you likely have a urinary tract infection.  You should take antibiotics for 1 week, be sure to take all of the pills.  We recommend following up with your primary care doctor.

## 2024-07-23 NOTE — ED Provider Notes (Signed)
 [place] EMERGENCY DEPARTMENT AT [facility] Provider Note   CSN: [id number] Arrival date & time: 07/23/24  9090     Patient presents with: Near Syncope   Dominion Kathan is a 83 y.o. male.   Patient presents after his legs gave out as he was walking in his house this morning.  Denies falling, states he just leaned forward on the wall.  Did not hit his head.  States he felt normal when he woke up and took all of his morning meds.  States this has happened to him in the past.  Denies chest pain, shortness of breath, palpitations, dizziness/lightheadedness, leg pain, recent illness, GI upset.  Denies sick contacts.  Has been eating and drinking normally.  Chronic indwelling Foley catheter, this was last changed about 1 week ago.  Lives with his wife at home.  The history is provided by the patient.  Near Syncope       Prior to Admission medications   Medication Sig Start Date End Date Taking? Authorizing Provider  tamsulosin  (FLOMAX ) 0.4 MG CAPS capsule Take 0.4 mg by mouth every 12 (twelve) hours. 10/09/22   [provider]    Allergies: Bee venom    Review of Systems  Cardiovascular:  Positive for near-syncope.    Updated Vital Signs There were no vitals taken for this visit.  Physical Exam Constitutional:      Appearance: Normal appearance.  HENT:     Head: Normocephalic and atraumatic.     Mouth/Throat:     Mouth: Mucous membranes are moist.  Eyes:     Extraocular Movements: Extraocular movements intact.     Conjunctiva/sclera: Conjunctivae normal.     Pupils: Pupils are equal, round, and reactive to light.  Cardiovascular:     Rate and Rhythm: Normal rate and regular rhythm.     Heart sounds: Normal heart sounds.  Pulmonary:     Effort: Pulmonary effort is normal.     Breath sounds: Normal breath sounds.  Abdominal:     General: Abdomen is flat. Bowel sounds are normal.     Palpations: Abdomen is soft.     Tenderness: There  is no abdominal tenderness.  Musculoskeletal:     Cervical back: Normal range of motion.  Skin:    General: Skin is warm and dry.  Neurological:     General: No focal deficit present.     Mental Status: He is alert.  Psychiatric:        Mood and Affect: Mood normal.        Behavior: Behavior normal.     (all labs ordered are listed, but only abnormal results are displayed) Labs Reviewed - No data to display  EKG: None  Radiology: No results found.   Procedures   Medications Ordered in the ED - No data to display                                  Medical Decision Making 83 year old patient with history of diabetes, vascular dementia, CKDIV, chronic indwelling Foley catheter who presents after a near syncopal episode at home.  Differential includes cardiogenic vs orthostatic vs reflex presyncope vs deconditioning/frailty.  Will obtain EKG, basic lab work, respiratory viral panel, and UA.  EKG shows NSR. CBC with leukocytosis to 16.0.  BMP WNL (creatinine at baseline). Respiratory viral panel negative.  UA showed leukocytes and bacteria. Urine culture pending. Will treat  patient for UTI with cefdinir  x 7 days.  Patient comfortable with plan and discharge home.  Patient knows to follow-up with PCP soon.  Amount and/or Complexity of Data Reviewed Labs: ordered.  Risk Prescription drug management.       Final diagnoses:  None    ED Discharge Orders     None          Adele Song, MD 07/23/24 1342    Elnor Jayson LABOR, DO 07/24/24 [phone]

## 2024-07-23 NOTE — ED Triage Notes (Addendum)
 BIB EMS due to a near syncope this morning after getting up. No other symptoms,108/64-70-100%   CBG 174  #20 L hand 300 cc NS.   Pt states he did not have a near syncope episode, his legs stopped moving while trying to walk.  Pt did not fall

## 2024-07-24 LAB — URINE CULTURE

## 2024-08-20 ENCOUNTER — Other Ambulatory Visit: Payer: Self-pay

## 2024-08-20 MED ORDER — TRUE METRIX BLOOD GLUCOSE TEST VI STRP: ORAL_STRIP | 1 refills | Status: AC

## 2024-08-26 LAB — RENAL FUNCTION PANEL: EGFR: 28

## 2024-09-20 NOTE — Progress Notes (Addendum)
 Assessment/Plan:     Frank Carlson is a very pleasant 83 y.o. year old RH male with a history of hypertension, hyperlipidemia, HOH, DM2, CKD, known large R middle cranial fossa arachnoid cyst without edema seen today for evaluation of memory loss.  He has been diagnosed in the past with MCI, but has not had a formal neurology evaluation. MoCA today is 17/30, now with concerns for Alzheimer's dementia.  He is fairly independent with his ADLs. Patient continues to drive short distances.   Dementia without  behavioral disturbance, concern for Alzheimer's disease     MRI brain without contrast to assess for underlying structural abnormality and assess vascular load  Check B12, TSH Start Donepezil 10 mg. Take half tablet (5 mg) daily for 2 weeks, then increase to the full tablet at 10 mg daily. Side effects discussed   Recommend good control of cardiovascular risk factors.   Continue to control mood as per PCP Recommend no further driving [place] up in 3 months     Subjective:    The patient is accompanied by his wife who supplements the history.    How long did patient have memory difficulties?  For the last 2 years As I got older I am forgetting the past more. Patient has difficulty remembering new information, recent conversations and names of people including his children. Had trouble using the computer, so he does not use it frequently.  He likes to read, watch Jeopardy, Wheel of Fortune.  repeats oneself?  Endorsed, not often Disoriented when walking into a room?  Denies except occasionally not remembering what patient came to the room for    Leaving objects in unusual places? He forgets were things are, such as the watch, wallet, pocket knife  Wandering behavior? One night he got dressed at 10 am and thought it was the morning and was going to go out to do the normal routine   Any personality changes, or depression, anxiety?  Has moments of irritability.  Hallucinations or  paranoia?  Denies.   Seizures? Denies.    Any sleep changes?  Sleeps well, always had vivid dreams, no REM behavior or sleepwalking.   Sleep apnea? Denies.   Any hygiene concerns?  Denies.   Independent of bathing and dressing?  Denies  Who is in charge of the medications? Wife is in charge. If distracted he may forget if he took it or not. Who is in charge of the finances? Patient is in charge     Any changes in appetite? He has been eating during the day more stuff He may forget he ate and eats again.     Patient have trouble swallowing?  Denies.   Does the patient cook?  Wife does the cooking.  Any headaches?  Denies.   Any vision changes?  Denies.  Chronic back pain?  Denies.   Ambulates with difficulty? Needs a walker to ambulate     Recent falls or head injuries? He had a mechanical fall 6 months ago does not remember details, another fall due to [facility] UTI  Sepsis  Vision changes? Denies. Stroke like symptoms?  Denies.   Any tremors?  Denies.   Any anosmia?  Denies.   Any incontinence of urine? History of urine retention, acute cystitis 07/2024. Uses a Foley Any bowel dysfunction? Denies.      Patient lives with his wife    History of heavy alcohol  intake? Denies.   History of heavy tobacco use?  Denies.   Family history of dementia?  Mother, Sister with  AD Does patient drive? Limited, short distances. Has been getting lost Retired acupuncturist Retired comptroller at [employer] Graduated from [employer]  MRI brain 05/2023, personally reviewed : There is background parenchymal volume loss with prominence of the ventricular system and extra-axial CSF spaces. There is mild background chronic small-vessel ischemic change in the supratentorial white matter. A large right middle cranial fossa arachnoid cyst is unchanged, with unchanged displacement of the surrounding brain parenchyma but no parenchymal edema. A small remote infarct in the left cerebral peduncle is  unchanged.    Allergies  Allergen Reactions   Bee Venom Anaphylaxis    Passed out    Current Outpatient Medications  Medication Instructions   glucose blood (TRUE METRIX BLOOD GLUCOSE TEST) test strip Use to check blood sugar once daily   tamsulosin  (FLOMAX ) 0.4 mg, Every 12 hours     VITALS:   Vitals:   09/24/24 0938  BP: 112/66  Pulse: 72  Resp: 20  SpO2: 97%  Weight: 198 lb (89.8 kg)  Height: 5' 11.5 (1.816 m)          09/24/2024    9:00 AM  Montreal Cognitive Assessment   Visuospatial/ Executive (0/5) 4  Naming (0/3) 3  Attention: Read list of digits (0/2) 2  Attention: Read list of letters (0/1) 1  Attention: Serial 7 subtraction starting at 100 (0/3) 3  Language: Repeat phrase (0/2) 2  Language : Fluency (0/1) 0  Abstraction (0/2) 0  Delayed Recall (0/5) 0  Orientation (0/6) 2  Total 17  Adjusted Score (based on education) 17        No data to display           PHYSICAL EXAM   HEENT:  Normocephalic, atraumatic. The mucous membranes are moist. The superficial temporal arteries are without ropiness or tenderness. Cardiovascular: Regular rate and rhythm. Lungs: Clear to auscultation bilaterally. Neck: There are no carotid bruits noted bilaterally. Orientation:  Alert and oriented to person, not to place or time. No aphasia or dysarthria. Fund of knowledge is reduced. Recent and remote memory impaired.  Attention and concentration are reduced.  Able to name objects and unable to repeat phrases. Delayed recall    Cranial nerves: There is good facial symmetry. Extraocular muscles are intact and visual fields are full to confrontational testing. Speech is fluent and clear, no tongue deviation. Hearing is decreased  to conversational tone.  Tone: Tone is good throughout. Sensation: Sensation is intact to light touch and pinprick throughout. Vibration is intact at the bilateral big toe. Coordination: The patient has no difficulty with RAM's or FNF  bilaterally. Normal finger to nose  Motor: Strength is 5/5 in the bilateral upper and lower extremities. There is no pronator drift. There are no fasciculations noted. DTR's: Deep tendon reflexes are 2/4 .  Plantar responses are downgoing bilaterally. Gait and Station: The patient is able to ambulate without difficulty. Gait is cautious and narrow.      Thank you for allowing us  the opportunity to participate in the care of this nice patient. Please do not hesitate to contact us  for any questions or concerns.   Total time spent on today's visit was 62 minutes dedicated to this patient today, preparing to see patient, examining the patient, ordering tests and/or medications and counseling the patient, documenting clinical information in the EHR or other health record, independently interpreting results and communicating results to the  patient/family, discussing treatment and goals, answering patient's questions and coordinating care.  Cc:  Default, Provider, MD  Camie Sevin 09/24/2024 10:28 AM

## 2024-09-24 ENCOUNTER — Ambulatory Visit

## 2024-09-24 ENCOUNTER — Ambulatory Visit: Admitting: Physician Assistant

## 2024-09-24 ENCOUNTER — Encounter: Payer: Self-pay | Admitting: Physician Assistant

## 2024-09-24 ENCOUNTER — Other Ambulatory Visit

## 2024-09-24 VITALS — BP 112/66 | HR 72 | Resp 20 | Ht 71.5 in | Wt 198.0 lb

## 2024-09-24 DIAGNOSIS — R413 Other amnesia: Secondary | ICD-10-CM | POA: Diagnosis not present

## 2024-09-24 MED ORDER — DONEPEZIL HCL 10 MG PO TABS: ORAL_TABLET | ORAL | 3 refills | Status: AC

## 2024-09-24 NOTE — Patient Instructions (Addendum)
 It was a pleasure to see you today at our office.   Recommendations:  MRI of the brain, the radiology office will call you to arrange you appointment  [phone]   Check labs today [address] We will start donepezil half tablet (5mg ) daily for 2  weeks,   then after 2 weeks, we will increase the dose to a full tablet of 10 mg daily.    Follow up in 3 months     [email or web address]   RECOMMENDATIONS FOR ALL PATIENTS WITH MEMORY PROBLEMS: 1. Continue to exercise (Recommend 30 minutes of walking everyday, or 3 hours every week) 2. Increase social interactions - continue going to [place] and enjoy social gatherings with friends and family 3. Eat healthy, avoid fried foods and eat more fruits and vegetables 4. Maintain adequate blood pressure, blood sugar, and blood cholesterol level. Reducing the risk of stroke and cardiovascular disease also helps promoting better memory. 5. Avoid stressful situations. Live a simple life and avoid aggravations. Organize your time and prepare for the next day in anticipation. 6. Sleep well, avoid any interruptions of sleep and avoid any distractions in the bedroom that may interfere with adequate sleep quality 7. Avoid sugar, avoid sweets as there is a strong link between excessive sugar intake, diabetes, and cognitive impairment We discussed the Mediterranean diet, which has been shown to help patients reduce the risk of progressive memory disorders and reduces cardiovascular risk. This includes eating fish, eat fruits and green leafy vegetables, nuts like almonds and hazelnuts, walnuts, and also use olive oil. Avoid fast foods and fried foods as much as possible. Avoid sweets and sugar as sugar use has been linked to worsening of memory function.  There is always a concern of gradual progression of memory problems. If this is the case, then we may need to adjust level of care according to patient needs.  Support, both to the patient and caregiver, should then be put into place.         DRIVING: Regarding driving, in patients with progressive memory problems, driving will be impaired. We advise to have someone else do the driving if trouble finding directions or if minor accidents are reported. Independent driving assessment is available to determine safety of driving.   If you are interested in the driving assessment, you can contact the following:  [employer] in [place] [phone]  [facility] [phone]  [facility] [phone]  [facility] [phone] or [phone]   FALL PRECAUTIONS: Be cautious when walking. Scan the area for obstacles that may increase the risk of trips and falls. When getting up in the mornings, sit up at the edge of the bed for a few minutes before getting out of bed. Consider elevating the bed at the head end to avoid drop of blood pressure when getting up. Walk always in a well-lit room (use night lights in the walls). Avoid area rugs or power cords from appliances in the middle of the walkways. Use a walker or a cane if necessary and consider physical therapy for balance exercise. Get your eyesight checked regularly.  FINANCIAL OVERSIGHT: Supervision, especially oversight when making financial decisions or transactions is also recommended.  HOME SAFETY: Consider the safety of the kitchen when operating appliances like stoves, microwave oven, and blender. Consider having supervision and share cooking responsibilities until no longer able to participate in those. Accidents with firearms and other hazards in the house should be identified and addressed as well.   ABILITY TO  BE LEFT ALONE: If patient is unable to contact 911 operator, consider using LifeLine, or when the need is there, arrange for someone to stay with patients. Smoking is a fire hazard, consider supervision or cessation. Risk of wandering  should be assessed by caregiver and if detected at any point, supervision and safe proof recommendations should be instituted.  MEDICATION SUPERVISION: Inability to self-administer medication needs to be constantly addressed. Implement a mechanism to ensure safe administration of the medications.      Mediterranean Diet A Mediterranean diet refers to food and lifestyle choices that are based on the traditions of countries located on the [employer] [place]. This way of eating has been shown to help prevent certain conditions and improve outcomes for people who have chronic diseases, like kidney disease and heart disease. What are tips for following this plan? Lifestyle  Cook and eat meals together with your family, when possible. Drink enough fluid to keep your urine clear or pale yellow. Be physically active every day. This includes: Aerobic exercise like running or swimming. Leisure activities like gardening, walking, or housework. Get 7-8 hours of sleep each night. If recommended by your health care provider, drink red wine in moderation. This means 1 glass a day for nonpregnant women and 2 glasses a day for men. A glass of wine equals 5 oz (150 mL). Reading food labels  Check the serving size of packaged foods. For foods such as rice and pasta, the serving size refers to the amount of cooked product, not dry. Check the total fat in packaged foods. Avoid foods that have saturated fat or trans fats. Check the ingredients list for added sugars, such as corn syrup. Shopping  At the grocery store, buy most of your food from the areas near the walls of the store. This includes: Fresh fruits and vegetables (produce). Grains, beans, nuts, and seeds. Some of these may be available in unpackaged forms or large amounts (in bulk). Fresh seafood. Poultry and eggs. Low-fat dairy products. Buy whole ingredients instead of prepackaged foods. Buy fresh fruits and vegetables in-season from local  farmers markets. Buy frozen fruits and vegetables in resealable bags. If you do not have access to quality fresh seafood, buy precooked frozen shrimp or canned fish, such as tuna, salmon, or sardines. Buy small amounts of raw or cooked vegetables, salads, or olives from the deli or salad bar at your store. Stock your pantry so you always have certain foods on hand, such as olive oil, canned tuna, canned tomatoes, rice, pasta, and beans. Cooking  Cook foods with extra-virgin olive oil instead of using butter or other vegetable oils. Have meat as a side dish, and have vegetables or grains as your main dish. This means having meat in small portions or adding small amounts of meat to foods like pasta or stew. Use beans or vegetables instead of meat in common dishes like chili or lasagna. Experiment with different cooking methods. Try roasting or broiling vegetables instead of steaming or sauteing them. Add frozen vegetables to soups, stews, pasta, or rice. Add nuts or seeds for added healthy fat at each meal. You can add these to yogurt, salads, or vegetable dishes. Marinate fish or vegetables using olive oil, lemon juice, garlic, and fresh herbs. Meal planning  Plan to eat 1 vegetarian meal one day each week. Try to work up to 2 vegetarian meals, if possible. Eat seafood 2 or more times a week. Have healthy snacks readily available, such as: Vegetable sticks with hummus. Greek  yogurt. Fruit and nut trail mix. Eat balanced meals throughout the week. This includes: Fruit: 2-3 servings a day Vegetables: 4-5 servings a day Low-fat dairy: 2 servings a day Fish, poultry, or lean meat: 1 serving a day Beans and legumes: 2 or more servings a week Nuts and seeds: 1-2 servings a day Whole grains: 6-8 servings a day Extra-virgin olive oil: 3-4 servings a day Limit red meat and sweets to only a few servings a month What are my food choices? Mediterranean diet Recommended Grains: Whole-grain pasta.  Brown rice. Bulgar wheat. Polenta. Couscous. Whole-wheat bread. [place]. Vegetables: Artichokes. Beets. Broccoli. Cabbage. Carrots. Eggplant. Green beans. Chard. Kale. Spinach. Onions. Leeks. Peas. Squash. Tomatoes. Peppers. Radishes. Fruits: Apples. Apricots. Avocado. Berries. Bananas. Cherries. Dates. Figs. Grapes. Lemons. Melon. Oranges. Peaches. Plums. Pomegranate. Meats and other protein foods: Beans. Almonds. Sunflower seeds. Pine nuts. Peanuts. Cod. Salmon. Scallops. Shrimp. Tuna. Tilapia. Clams. Oysters. Eggs. Dairy: Low-fat milk. Cheese. Greek yogurt. Beverages: Water . Red wine. Herbal tea. Fats and oils: Extra virgin olive oil. Avocado oil. Grape seed oil. Sweets and desserts: Greek yogurt with honey. Baked apples. Poached pears. Trail mix. Seasoning and other foods: Basil. Cilantro. Coriander. Cumin. Mint. Parsley. Sage. Rosemary. Tarragon. Garlic. Oregano. Thyme. Pepper. Balsalmic vinegar. Tahini. Hummus. Tomato sauce. Olives. Mushrooms. Limit these Grains: Prepackaged pasta or rice dishes. Prepackaged cereal with added sugar. Vegetables: Deep fried potatoes (french fries). Fruits: Fruit canned in syrup. Meats and other protein foods: Beef. Pork. Lamb. Poultry with skin. Hot dogs. [place]. Dairy: Ice cream. Sour cream. Whole milk. Beverages: Juice. Sugar-sweetened soft drinks. Beer. Liquor and spirits. Fats and oils: Butter. Canola oil. Vegetable oil. Beef fat (tallow). Lard. Sweets and desserts: Cookies. Cakes. Pies. Candy. Seasoning and other foods: Mayonnaise. Premade sauces and marinades. The items listed may not be a complete list. Talk with your dietitian about what dietary choices are right for you. Summary The Mediterranean diet includes both food and lifestyle choices. Eat a variety of fresh fruits and vegetables, beans, nuts, seeds, and whole grains. Limit the amount of red meat and sweets that you eat. Talk with your health care provider about whether it is safe  for you to drink red wine in moderation. This means 1 glass a day for nonpregnant women and 2 glasses a day for men. A glass of wine equals 5 oz (150 mL). This information is not intended to replace advice given to you by your health care provider. Make sure you discuss any questions you have with your health care provider. Document Released: 06/21/2016 Document Revised: 07/24/2016 Document Reviewed: 06/21/2016 Elsevier Interactive Patient Education  2017 [employer].

## 2024-09-25 ENCOUNTER — Ambulatory Visit: Payer: Self-pay | Admitting: Physician Assistant

## 2024-09-25 LAB — VITAMIN B12: Vitamin B-12: 1168 pg/mL — ABNORMAL HIGH (ref 200–1100)

## 2024-09-25 LAB — TSH: TSH: 3.13 m[IU]/L (ref 0.40–4.50)

## 2024-10-13 ENCOUNTER — Inpatient Hospital Stay: Admission: RE | Admit: 2024-10-13 | Discharge: 2024-10-13 | Attending: Internal Medicine | Admitting: Internal Medicine

## 2024-10-13 ENCOUNTER — Ambulatory Visit (INDEPENDENT_AMBULATORY_CARE_PROVIDER_SITE_OTHER)

## 2024-10-13 VITALS — BP 109/65 | HR 72 | Temp 97.6°F | Resp 18 | Ht 71.5 in | Wt 198.0 lb

## 2024-10-13 DIAGNOSIS — R051 Acute cough: Secondary | ICD-10-CM

## 2024-10-13 HISTORY — DX: Allergy, unspecified, initial encounter: T78.40XA

## 2024-10-13 MED ORDER — BENZONATATE 100 MG PO CAPS: 100.0000 mg | ORAL_CAPSULE | Freq: Three times a day (TID) | ORAL | 0 refills | Status: DC | PRN

## 2024-10-13 NOTE — ED Triage Notes (Signed)
 Patient reports having a Cough that stated about a week ago and just getting worse. No fever. No sob. No Wheezing. Some nasal discharge at times.

## 2024-10-13 NOTE — ED Provider Notes (Addendum)
 [facility]    CSN: [id number] Arrival date & time: 10/13/24  1001      History   Chief Complaint Chief Complaint  Patient presents with   Cough    HPI Frank Carlson is a 83 y.o. male.   Patient presents with 1 week history of dry cough.  Denies any associated upper respiratory symptoms or fever.  Denies any known sick contacts.  Patient denies formal diagnosis of asthma or COPD.  Reports that he did previously smoke cigarettes in the 1960s but does not currently smoke cigarettes.  Denies shortness of breath.  Patient's family member at bedside who helps provide history.   Cough   Past Medical History:  Diagnosis Date   Allergy 2018   BPH (benign prostatic hypertrophy)    COLONIC POLYPS, HX OF 03/15/2008   COLOR BLINDNESS 11/01/2009   CONCUSSION WITH LOC OF 30 MINUTES OR LESS 11/01/2009   Diabetes mellitus without complication (HCC) 2010   TYPE 2   ED (erectile dysfunction)    HEARING LOSS, BILATERAL 11/01/2009   Wears hearing aids   Hyperlipidemia    JOINT STIFFNESS, HAND 10/28/2008   Pleural effusion 10/2019   Pneumonia    Type II or unspecified type diabetes mellitus with neurological manifestations, not stated as uncontrolled(250.60) 11/2013   Type 2    Patient Active Problem List   Diagnosis Date Noted   Chronic retention of urine 09/24/2023   Chronic renal disease, stage IV (HCC) 09/15/2023   Normocytic anemia 09/15/2023   Vascular dementia without behavioral disturbance (HCC) 05/28/2023   Type 2 diabetes mellitus with chronic kidney disease (HCC) 12/06/2022   Orthostatic hypotension 12/06/2022   Arachnoid cyst 07/05/2020   Aortic atherosclerosis 05/03/2020   BPH with obstruction/lower urinary tract symptoms 04/03/2016   Advance directive discussed with patient 03/28/2015   Hyperlipidemia    Diabetes mellitus with neurological manifestations, controlled (HCC) 12/08/2013   Routine health maintenance 11/26/2011   Presbycusis of both ears  11/01/2009    Past Surgical History:  Procedure Laterality Date   CATARACT EXTRACTION Bilateral 07/2010   OD with IOL   COLONOSCOPY W/ POLYPECTOMY     INCISION / DRAINAGE HAND / FINGER Right    INGUINAL HERNIA REPAIR Right 08/23/2015   Procedure: LAPAROSCOPIC RIGHT INGUINAL HERNIA REPAIR WITH MESH;  Surgeon: Lynda Leos, MD;  Location: [facility] OR;  Service: General;  Laterality: Right;   INSERTION OF MESH Right 08/23/2015   Procedure: INSERTION OF MESH;  Surgeon: Lynda Leos, MD;  Location: [facility] OR;  Service: General;  Laterality: Right;   IR THORACENTESIS ASP PLEURAL SPACE W/IMG GUIDE  10/15/2019   TONSILLECTOMY     VASECTOMY         Home Medications    Prior to Admission medications   Medication Sig Start Date End Date Taking? Authorizing Provider  benzonatate (TESSALON) 100 MG capsule Take 1 capsule (100 mg total) by mouth every 8 (eight) hours as needed for cough. 10/13/24  Yes Ethon Wymer, Darryle FORBES, FNP  donepezil  (ARICEPT ) 10 MG tablet Take half tablet (5 mg) daily for 2 weeks, then increase to the full tablet at 10 mg daily. 09/24/24   Wertman, Sara E, PA-C  glucose blood (TRUE METRIX BLOOD GLUCOSE TEST) test strip Use to check blood sugar once daily 08/20/24   Webb, Padonda B, FNP  tamsulosin  (FLOMAX ) 0.4 MG CAPS capsule Take 0.4 mg by mouth every 12 (twelve) hours. 10/09/22   [provider]    Family History Family History  Problem Relation Age of Onset   Alzheimer's disease Mother    Dementia Mother    Coronary artery disease Father    Heart disease Father    Coronary artery disease Other    Diabetes Other    Alzheimer's disease Sister    Dementia Brother    Alzheimer's disease Brother    Diabetes Maternal Grandmother     Social History Social History   Tobacco Use   Smoking status: Former    Current packs/day: 0.00    Average packs/day: 1.5 packs/day for 5.0 years (7.6 ttl pk-yrs)    Types: Cigarettes    Start date: 18    Quit date: 11/25/1958     Years since quitting: 65.9    Passive exposure: Never   Smokeless tobacco: Never  Vaping Use   Vaping status: Never Used  Substance Use Topics   Alcohol  use: No   Drug use: No     Allergies   Bee venom   Review of Systems Review of Systems Per HPI  Physical Exam Triage Vital Signs ED Triage Vitals  Encounter Vitals Group     BP 10/13/24 1029 109/65     Girls Systolic BP Percentile --      Girls Diastolic BP Percentile --      Boys Systolic BP Percentile --      Boys Diastolic BP Percentile --      Pulse Rate 10/13/24 1029 72     Resp 10/13/24 1029 18     Temp 10/13/24 1029 97.6 F (36.4 C)     Temp Source 10/13/24 1029 Oral     SpO2 10/13/24 1029 96 %     Weight 10/13/24 1027 197 lb 15.6 oz (89.8 kg)     Height 10/13/24 1027 5' 11.5 (1.816 m)     Head Circumference --      Peak Flow --      Pain Score 10/13/24 1024 0     Pain Loc --      Pain Education --      Exclude from Growth Chart --    No data found.  Updated Vital Signs BP 109/65 (BP Location: Left Arm)   Pulse 72   Temp 97.6 F (36.4 C) (Oral)   Resp 18   Ht 5' 11.5 (1.816 m)   Wt 197 lb 15.6 oz (89.8 kg)   SpO2 96%   BMI 27.23 kg/m   Visual Acuity Right Eye Distance:   Left Eye Distance:   Bilateral Distance:    Right Eye Near:   Left Eye Near:    Bilateral Near:     Physical Exam Constitutional:      General: He is not in acute distress.    Appearance: Normal appearance. He is not toxic-appearing or diaphoretic.  HENT:     Head: Normocephalic and atraumatic.  Eyes:     Extraocular Movements: Extraocular movements intact.     Conjunctiva/sclera: Conjunctivae normal.  Cardiovascular:     Rate and Rhythm: Normal rate and regular rhythm.     Pulses: Normal pulses.     Heart sounds: Normal heart sounds.  Pulmonary:     Effort: Pulmonary effort is normal. No respiratory distress.     Breath sounds: Normal breath sounds. No stridor. No wheezing, rhonchi or rales.  Neurological:      General: No focal deficit present.     Mental Status: He is alert and oriented to person, place, and time. Mental status is at baseline.  Psychiatric:        Mood and Affect: Mood normal.        Behavior: Behavior normal.        Thought Content: Thought content normal.        Judgment: Judgment normal.      UC Treatments / Results  Labs (all labs ordered are listed, but only abnormal results are displayed) Labs Reviewed - No data to display  EKG   Radiology DG Chest 2 View Result Date: 10/13/2024 EXAM: 2 VIEW(S) XRAY OF THE CHEST 10/13/2024 11:24:03 AM COMPARISON: 09/15/2023 CLINICAL HISTORY: cough FINDINGS: LUNGS AND PLEURA: Hyperinflation. Linear scarring at right lung base. Calcified granulomas in left lung. No pleural effusion. No pneumothorax. HEART AND MEDIASTINUM: Aortic atherosclerosis. No acute abnormality of the cardiac and mediastinal silhouettes. BONES AND SOFT TISSUES: Lower thoracic chronic compression fractures noted. IMPRESSION: 1. No acute cardiopulmonary process. 2. Linear scarring at the right lung base. 3. Aortic atherosclerosis. 4. Chronic lower thoracic compression fractures. Electronically signed by: Ryan Salvage MD 10/13/2024 11:56 AM EST RP Workstation: [id number]    Procedures Procedures (including critical care time)  Medications Ordered in UC Medications - No data to display  Initial Impression / Assessment and Plan / UC Course  I have reviewed the triage vital signs and the nursing notes.  Pertinent labs & imaging results that were available during my care of the patient were reviewed by me and considered in my medical decision making (see chart for details).     Chest x-ray completed that was negative for any acute cardiopulmonary process.  Chronic compression fractures noted but nothing acute. Differential diagnoses include postviral cough versus allergies.  Viral testing deferred given duration of symptoms as it would not change treatment.   Will treat with benzonatate to take as needed for cough.  Patient reports improvement in cough since it started which is reassuring.  Advised patient to follow-up with PCP or urgent care if cough persists or worsens.  Discussed chronic compression fractures on x-ray.  Patient reports he was not aware of this and he denies pain.  Advised PCP follow-up for monitoring for this.  Patient and family member verbalized understanding and were agreeable with plan.  Final Clinical Impressions(s) / UC Diagnoses   Final diagnoses:  Acute cough     Discharge Instructions      Your chest x-ray was normal.  I have prescribed a cough medication to take as needed.  Please follow-up if symptoms persist or worsen.     ED Prescriptions     Medication Sig Dispense Auth. Provider   benzonatate (TESSALON) 100 MG capsule Take 1 capsule (100 mg total) by mouth every 8 (eight) hours as needed for cough. 21 capsule [place], Atley [place] E, [place]      PDMP not reviewed this encounter.   Hazen Darryle BRAVO, [place] 10/13/24 1244    Hazen Darryle BRAVO, [place] 10/13/24 1251    Hazen Darryle BRAVO, [place] 10/13/24 1501

## 2024-10-13 NOTE — Discharge Instructions (Signed)
 Your chest x-ray was normal.  I have prescribed a cough medication to take as needed.  Please follow-up if symptoms persist or worsen.

## 2024-10-19 ENCOUNTER — Other Ambulatory Visit: Payer: Self-pay

## 2024-10-22 ENCOUNTER — Other Ambulatory Visit

## 2024-10-27 ENCOUNTER — Ambulatory Visit: Admitting: Nurse Practitioner

## 2024-10-27 ENCOUNTER — Ambulatory Visit (INDEPENDENT_AMBULATORY_CARE_PROVIDER_SITE_OTHER)
Admission: RE | Admit: 2024-10-27 | Discharge: 2024-10-27 | Disposition: A | Source: Ambulatory Visit | Attending: Nurse Practitioner

## 2024-10-27 VITALS — BP 118/80 | HR 60 | Temp 97.7°F | Ht 71.5 in | Wt 193.8 lb

## 2024-10-27 DIAGNOSIS — J22 Unspecified acute lower respiratory infection: Secondary | ICD-10-CM

## 2024-10-27 DIAGNOSIS — R0689 Other abnormalities of breathing: Secondary | ICD-10-CM

## 2024-10-27 DIAGNOSIS — R051 Acute cough: Secondary | ICD-10-CM

## 2024-10-27 MED ORDER — DOXYCYCLINE HYCLATE 100 MG PO TABS: 100.0000 mg | ORAL_TABLET | Freq: Two times a day (BID) | ORAL | 0 refills | Status: AC

## 2024-10-27 MED ORDER — FLUTICASONE PROPIONATE 50 MCG/ACT NA SUSP: 2.0000 | Freq: Every day | NASAL | 0 refills | Status: AC

## 2024-10-27 NOTE — Patient Instructions (Signed)
 Nice to see you today  I will be in touch with the chest xray once I have it Follow up if you do not improve

## 2024-10-27 NOTE — Progress Notes (Signed)
 Established Patient Office Visit  Subjective   Patient ID: Frank Carlson, male    DOB: 04/24/1941  Age: 83 y.o. MRN: [id number]  Chief Complaint  Patient presents with   Hospitalization Follow-up    Pt complains of feeling a little better but still has a cough. About 2 or 3 weeks. Pt states medication given at UC has not helped.     HPI  Patient was seen in the emergency department on 07/23/2024.  At that point patient's legs gave out.  He does have a chronic indwelling Foley catheter that was changed a week prior to the ED visit per ED note.  Patient was treated for UTI with cefdinir  for 7 days and was recommended to follow-up with PCP Patient is followed [facility]. Of note patient was seen in urgent care on 10/13/2024 for cough for 1 week.  Does have a remote history of smoking in the 60s but not currently smoking.  Vital signs were stable.  Chest x-ray was performed that was negative for acute cardiopulmonary processes did show chronic compression fractures.  Patient was treated with benzonatate .  Since  Discussed the use of AI scribe software for clinical note transcription with the patient, who gave verbal consent to proceed.  History of Present Illness Frank Carlson is an 83 year old male who presents with a persistent cough for one month.  He has experienced a persistent cough for at least three weeks out of the past month. He denies exposure to anyone sick and has received both the flu and COVID vaccines. Previous medication from urgent care did not alleviate his symptoms.  The cough is productive of drainage felt in the throat, though he has not expectorated any sputum. He notes that the drainage causes congestion, prompting him to cough it up. No shortness of breath, fever, chills, sore throat, fatigue, headache, nausea, vomiting, diarrhea, or chest pain.  He manages his catheter care with a urologist and  visits the office for changes. He uses a [facility] pharmacy in [place] for medications and has a history of using [employer] for prescription refills. He performs daily blood glucose testing, which requires regular refills.     Review of Systems  Constitutional:  Negative for chills, fever and malaise/fatigue.  HENT:  Negative for ear pain, sinus pain and sore throat.   Respiratory:  Positive for cough. Negative for shortness of breath.   Gastrointestinal:  Negative for abdominal pain, nausea and vomiting.  Neurological:  Negative for headaches.      Objective:     BP 118/80   Pulse 60   Temp 97.7 F (36.5 C) (Oral)   Ht 5' 11.5 (1.816 m)   Wt 193 lb 12.8 oz (87.9 kg)   SpO2 94%   BMI 26.65 kg/m    Physical Exam Vitals and nursing note reviewed.  Constitutional:      Appearance: Normal appearance.  HENT:     Right Ear: Tympanic membrane, ear canal and external ear normal.     Left Ear: Tympanic membrane, ear canal and external ear normal.     Mouth/Throat:     Mouth: Mucous membranes are moist.     Pharynx: Oropharynx is clear. No posterior oropharyngeal erythema.  Cardiovascular:     Rate and Rhythm: Normal rate and regular rhythm.     Heart sounds: Normal heart sounds.  Pulmonary:     Effort: Pulmonary effort is normal.  Breath sounds: Rales (RLL) present.  Lymphadenopathy:     Cervical: No cervical adenopathy.  Neurological:     Mental Status: He is alert.      No results found for any visits on 10/27/24.    The ASCVD Risk score (Arnett DK, et al., 2019) failed to calculate for the following reasons:   The 2019 ASCVD risk score is only valid for ages 64 to 33   * - Cholesterol units were assumed    Assessment & Plan:   Problem List Items Addressed This Visit   None Visit Diagnoses       Acute cough    -  Primary   Relevant Medications   fluticasone  (FLONASE ) 50 MCG/ACT nasal spray   Other Relevant Orders   DG Chest 2 View (Completed)     Lower  respiratory infection       Relevant Medications   doxycycline  (VIBRA -TABS) 100 MG tablet   Other Relevant Orders   DG Chest 2 View (Completed)     Adventitious breath sounds       Relevant Orders   DG Chest 2 View (Completed)      Assessment and Plan Assessment & Plan cough with post-nasal drainage Persisting for three weeks. Differential includes possible pneumonia. Initiated antibiotics due to symptom duration and potential bacterial involvement. - Ordered repeat chest x-ray to rule out pneumonia. - Prescribed doxycycline  100 mg BID for 7 days. - Prescribed nasal spray for post-nasal drip. - Sent prescriptions to local pharmacy. - Advised to report if no improvement.   Return if symptoms worsen or fail to improve, for as scheduled .    Adina Crandall, NP

## 2024-10-29 ENCOUNTER — Ambulatory Visit: Payer: Self-pay | Admitting: Nurse Practitioner

## 2024-10-29 NOTE — Telephone Encounter (Signed)
 Called patient reviewed all information and repeated back to me. Will call if any questions.  ? ?

## 2024-11-09 ENCOUNTER — Other Ambulatory Visit: Payer: Self-pay

## 2024-11-09 ENCOUNTER — Emergency Department (HOSPITAL_COMMUNITY)

## 2024-11-09 ENCOUNTER — Emergency Department (HOSPITAL_COMMUNITY)
Admission: EM | Admit: 2024-11-09 | Discharge: 2024-11-09 | Disposition: A | Discharge status: Home / Self Care | Attending: Emergency Medicine | Admitting: Emergency Medicine

## 2024-11-09 DIAGNOSIS — N390 Urinary tract infection, site not specified: Secondary | ICD-10-CM | POA: Diagnosis not present

## 2024-11-09 DIAGNOSIS — Z79899 Other long term (current) drug therapy: Secondary | ICD-10-CM | POA: Insufficient documentation

## 2024-11-09 DIAGNOSIS — R42 Dizziness and giddiness: Secondary | ICD-10-CM | POA: Insufficient documentation

## 2024-11-09 LAB — URINALYSIS, ROUTINE W REFLEX MICROSCOPIC
Bilirubin Urine: NEGATIVE
Glucose, UA: NEGATIVE mg/dL
Ketones, ur: NEGATIVE mg/dL
Nitrite: POSITIVE — AB
Protein, ur: 30 mg/dL — AB
Specific Gravity, Urine: 1.012 (ref 1.005–1.030)
WBC, UA: >50 WBC/hpf (ref 0–5)
pH: 6 (ref 5.0–8.0)

## 2024-11-09 LAB — CBC WITH DIFFERENTIAL/PLATELET
Abs Immature Granulocytes: 0.06 K/uL (ref 0.00–0.07)
Basophils Absolute: 0 K/uL (ref 0.0–0.1)
Basophils Relative: 0 %
Eosinophils Absolute: 0 K/uL (ref 0.0–0.5)
Eosinophils Relative: 0 %
HCT: 36.6 % — ABNORMAL LOW (ref 39.0–52.0)
Hemoglobin: 11.8 g/dL — ABNORMAL LOW (ref 13.0–17.0)
Immature Granulocytes: 0 %
Lymphocytes Relative: 4 %
Lymphs Abs: 0.7 K/uL (ref 0.7–4.0)
MCH: 29.1 pg (ref 26.0–34.0)
MCHC: 32.2 g/dL (ref 30.0–36.0)
MCV: 90.4 fL (ref 80.0–100.0)
Monocytes Absolute: 1.5 K/uL — ABNORMAL HIGH (ref 0.1–1.0)
Monocytes Relative: 8 %
Neutro Abs: 17.6 K/uL — ABNORMAL HIGH (ref 1.7–7.7)
Neutrophils Relative %: 88 %
Platelets: 146 K/uL — ABNORMAL LOW (ref 150–400)
RBC: 4.05 MIL/uL — ABNORMAL LOW (ref 4.22–5.81)
RDW: 14.2 % (ref 11.5–15.5)
WBC: 20 K/uL — ABNORMAL HIGH (ref 4.0–10.5)
nRBC: 0 % (ref 0.0–0.2)

## 2024-11-09 LAB — BASIC METABOLIC PANEL WITH GFR
Anion gap: 9 (ref 5–15)
BUN: 26 mg/dL — ABNORMAL HIGH (ref 8–23)
CO2: 25 mmol/L (ref 22–32)
Calcium: 8.8 mg/dL — ABNORMAL LOW (ref 8.9–10.3)
Chloride: 107 mmol/L (ref 98–111)
Creatinine, Ser: 2.13 mg/dL — ABNORMAL HIGH (ref 0.61–1.24)
GFR, Estimated: 30 mL/min — ABNORMAL LOW
Glucose, Bld: 134 mg/dL — ABNORMAL HIGH (ref 70–99)
Potassium: 4.6 mmol/L (ref 3.5–5.1)
Sodium: 140 mmol/L (ref 135–145)

## 2024-11-09 LAB — TROPONIN T, HIGH SENSITIVITY
Troponin T High Sensitivity: 22 ng/L — ABNORMAL HIGH (ref 0–19)
Troponin T High Sensitivity: 24 ng/L — ABNORMAL HIGH (ref 0–19)

## 2024-11-09 MED ORDER — SODIUM CHLORIDE 0.9 % IV SOLN
2.0000 g | Freq: Once | INTRAVENOUS | Status: AC
  Administered 2024-11-09: 2 g via INTRAVENOUS
  Filled 2024-11-09: qty 20

## 2024-11-09 MED ORDER — ZIPRASIDONE MESYLATE 20 MG IM SOLR: 10.0000 mg | Freq: Once | INTRAMUSCULAR | Status: DC

## 2024-11-09 MED ORDER — SODIUM CHLORIDE 0.9 % IV BOLUS
1000.0000 mL | Freq: Once | INTRAVENOUS | Status: AC
  Administered 2024-11-09: 1000 mL via INTRAVENOUS

## 2024-11-09 MED ORDER — CEPHALEXIN 500 MG PO CAPS: 500.0000 mg | ORAL_CAPSULE | Freq: Two times a day (BID) | ORAL | 0 refills | Status: AC

## 2024-11-09 NOTE — Discharge Instructions (Signed)
 You were seen in the emergency department after a dizziness episode at home.  Your blood work was fairly unremarkable but your urine showed signs of a possible urinary tract infection.  You were given an IV dose of antibiotics.  A prescription was sent to the pharmacy to start tomorrow.  Please keep well-hydrated.  Follow-up with your regular doctor.  Return if any worsening or concerning symptoms

## 2024-11-09 NOTE — ED Triage Notes (Signed)
 Pt. BIB [facility] from home with c/o dizziness; Upon [facility] pt. Was diaphoretic and messing with his catheter; Pt's. Initial BP was 60/28, but [facility] administered 500mls NS and BP went up to 110/68. Pt. Has 18G in L Wrist.

## 2024-11-09 NOTE — ED Notes (Signed)
 Nena Azar's, the patient's wife is requesting an update. She can be reached at [phone].

## 2024-11-09 NOTE — ED Provider Notes (Signed)
 " [place] [facility] Provider Note   CSN: [id number] Arrival date & time: 11/09/24  1008     Patient presents with: Loss of Consciousness   Frank Carlson is a 83 y.o. male.  He is brought in by ambulance from home after an episode of dizziness lightheadedness.  He said he was asleep and was getting up and went to the bathroom and began feeling lightheaded.  Initial blood pressure with EMS was 60/30.  They gave him 500 cc with improvement.  Patient denies any fevers chills nausea vomiting chest pain shortness of breath.  Has a chronic indwelling Foley catheter that he said for the last few months.  He said he has been eating and drinking well.  Denies prior dizziness episodes.  No numbness or weakness.   The history is provided by the patient.  Near Syncope This is a new problem. The problem has been resolved. Pertinent negatives include no chest pain, no abdominal pain, no headaches and no shortness of breath. The symptoms are aggravated by standing. Nothing relieves the symptoms. He has tried nothing for the symptoms. The treatment provided no relief.       Prior to Admission medications  Medication Sig Start Date End Date Taking? Authorizing Provider  donepezil  (ARICEPT ) 10 MG tablet Take half tablet (5 mg) daily for 2 weeks, then increase to the full tablet at 10 mg daily. 09/24/24   Wertman, Sara E, PA-C  fluticasone  (FLONASE ) 50 MCG/ACT nasal spray Place 2 sprays into both nostrils daily. 10/27/24   Wendee Lynwood HERO, NP  glucose blood (TRUE METRIX BLOOD GLUCOSE TEST) test strip Use to check blood sugar once daily 08/20/24   Webb, Padonda B, FNP  tamsulosin  (FLOMAX ) 0.4 MG CAPS capsule Take 0.4 mg by mouth every 12 (twelve) hours. 10/09/22   [provider]    Allergies: Bee venom    Review of Systems  Respiratory:  Negative for shortness of breath.   Cardiovascular:  Positive for near-syncope. Negative for chest pain.   Gastrointestinal:  Negative for abdominal pain.  Neurological:  Negative for headaches.    Updated Vital Signs BP 110/64   Pulse 61   Temp 97.6 F (36.4 C) (Oral)   Resp 16   Ht 6' (1.829 m)   Wt 86.2 kg   SpO2 100%   BMI 25.77 kg/m   Physical Exam Vitals and nursing note reviewed.  Constitutional:      General: He is not in acute distress.    Appearance: Normal appearance. He is well-developed.  HENT:     Head: Normocephalic and atraumatic.  Eyes:     Conjunctiva/sclera: Conjunctivae normal.  Cardiovascular:     Rate and Rhythm: Normal rate and regular rhythm.     Heart sounds: No murmur heard. Pulmonary:     Effort: Pulmonary effort is normal. No respiratory distress.     Breath sounds: Normal breath sounds.  Abdominal:     Palpations: Abdomen is soft.     Tenderness: There is no abdominal tenderness. There is no guarding or rebound.  Musculoskeletal:        General: No deformity.     Cervical back: Neck supple.  Skin:    General: Skin is warm and dry.     Capillary Refill: Capillary refill takes less than 2 seconds.  Neurological:     General: No focal deficit present.     Mental Status: He is alert.     Sensory:  No sensory deficit.     Motor: No weakness.     (all labs ordered are listed, but only abnormal results are displayed) Labs Reviewed  CBC WITH DIFFERENTIAL/PLATELET - Abnormal; Notable for the following components:      Result Value   WBC 20.0 (*)    RBC 4.05 (*)    Hemoglobin 11.8 (*)    HCT 36.6 (*)    Platelets 146 (*)    Neutro Abs 17.6 (*)    Monocytes Absolute 1.5 (*)    All other components within normal limits  URINALYSIS, ROUTINE W REFLEX MICROSCOPIC - Abnormal; Notable for the following components:   APPearance CLOUDY (*)    Hgb urine dipstick SMALL (*)    Protein, ur 30 (*)    Nitrite POSITIVE (*)    Leukocytes,Ua LARGE (*)    Bacteria, UA MANY (*)    All other components within normal limits  BASIC METABOLIC PANEL WITH GFR  - Abnormal; Notable for the following components:   Glucose, Bld 134 (*)    BUN 26 (*)    Creatinine, Ser 2.13 (*)    Calcium 8.8 (*)    GFR, Estimated 30 (*)    All other components within normal limits  TROPONIN T, HIGH SENSITIVITY - Abnormal; Notable for the following components:   Troponin T High Sensitivity 22 (*)    All other components within normal limits  TROPONIN T, HIGH SENSITIVITY - Abnormal; Notable for the following components:   Troponin T High Sensitivity 24 (*)    All other components within normal limits  URINE CULTURE    EKG: None  Radiology: DG Chest 1 View Result Date: 11/09/2024 EXAM: 1 VIEW(S) XRAY OF THE CHEST 11/09/2024 12:01:44 PM COMPARISON: 10/27/2024 CLINICAL HISTORY: Dizziness. FINDINGS: LUNGS AND PLEURA: Stable calcified granulomas in the left lung. No acute confluent airspace opacity. No pleural effusion. No pneumothorax. HEART AND MEDIASTINUM: No acute abnormality of the cardiac and mediastinal silhouettes. BONES AND SOFT TISSUES: No acute osseous abnormality. IMPRESSION: 1. No active cardiopulmonary disease. Electronically signed by: Franky Crease MD 11/09/2024 01:41 PM EST RP Workstation: [id number]     Procedures   Medications Ordered in the ED  sodium chloride  0.9 % bolus 1,000 mL (0 mLs Intravenous Stopped 11/09/24 1308)  cefTRIAXone  (ROCEPHIN ) 2 g in sodium chloride  0.9 % 100 mL IVPB (0 g Intravenous Stopped 11/09/24 1455)    Clinical Course as of 11/09/24 1715  Mon Nov 09, 2024  1250 Discussed with patient's wife at home.  She said she was with her when he had his episode.  She said he has been a little more tired over the last few days and complaining of some back pain.  She said he has had these dizzy spells before however.  She would come pick him up once our workup is done if no significant findings. [MB]    Clinical Course User Index [MB] Towana Ozell BROCKS, MD                                 Medical Decision Making Amount and/or  Complexity of Data Reviewed Labs: ordered. Radiology: ordered.  Risk Prescription drug management.   This patient complains of near syncope; this involves an extensive number of treatment Options and is a complaint that carries with it a high risk of complications and morbidity. The differential includes dehydration, vasovagal, infection, arrhythmia  I ordered, reviewed and interpreted labs, which  included CBC with elevated white count stable low hemoglobin, chemistries with chronic CKD, urinalysis possible signs of infection sent for culture I ordered medication IV fluids IV antibiotics and reviewed PMP when indicated. I ordered imaging studies which included chest x-ray and I independently    visualized and interpreted imaging which showed no acute findings Additional history obtained from patient's wife Previous records obtained and reviewed in epic, recent urgent care and PCP visit for cough Cardiac monitoring reviewed, sinus rhythm Social determinants considered, no significant barriers Critical Interventions: None  After the interventions stated above, I reevaluated the patient and found patient to be feeling better, orthostatics unremarkable. Admission and further testing considered, feel he is safe for discharge and return home.  Wife is happy to come pick him up.  Will prescribe antibiotics.  Recommended close follow-up with PCP.  Return instructions discussed.      Final diagnoses:  Dizziness  Lower urinary tract infectious disease    ED Discharge Orders          Ordered    cephALEXin (KEFLEX) 500 MG capsule  2 times daily        11/09/24 1408               Towana Ozell BROCKS, MD 11/09/24 1718  "

## 2024-11-09 NOTE — ED Notes (Signed)
 Patient transported to X-ray

## 2024-11-11 LAB — URINE CULTURE: Culture: >=100000 — AB

## 2024-11-12 ENCOUNTER — Telehealth (HOSPITAL_BASED_OUTPATIENT_CLINIC_OR_DEPARTMENT_OTHER): Payer: Self-pay

## 2024-11-12 NOTE — Progress Notes (Signed)
 ED Antimicrobial Stewardship Positive Culture Follow Up   Frank Carlson is an 84 y.o. male who presented to [facility] with a chief complaint of dizziness / lightheadedness.   Chief Complaint  Patient presents with   Dizziness   Hypotension    Recent Results (from the past 720 hours)  Urine Culture     Status: Abnormal   Collection Time: 11/09/24  1:39 PM   Specimen: Urine, Catheterized  Result Value Ref Range Status   Specimen Description URINE, CATHETERIZED  Final   Special Requests   Final    NONE Performed at [facility], [address] [place], [place] [address]    Culture >=100,000 COLONIES/mL PROVIDENCIA STUARTII (A)  Final   Report Status 11/11/2024 FINAL  Final   Organism ID, Bacteria PROVIDENCIA STUARTII (A)  Final      Susceptibility   Providencia stuartii - MIC*    AMPICILLIN RESISTANT Resistant     CEFEPIME  <=0.12 SENSITIVE Sensitive     ERTAPENEM <=0.12 SENSITIVE Sensitive     CEFTRIAXONE  <=0.25 SENSITIVE Sensitive     CIPROFLOXACIN  <=0.06 SENSITIVE Sensitive     GENTAMICIN RESISTANT Resistant     NITROFURANTOIN 256 RESISTANT Resistant     TRIMETH /SULFA  <=20 SENSITIVE Sensitive     AMPICILLIN/SULBACTAM 16 INTERMEDIATE Intermediate     PIP/TAZO Value in next row Sensitive      <=4 SENSITIVEThis is a modified FDA-approved test that has been validated and its performance characteristics determined by the reporting laboratory.  This laboratory is certified under the Clinical Laboratory Improvement Amendments CLIA as qualified to perform high complexity clinical laboratory testing.    MEROPENEM Value in next row Sensitive      <=4 SENSITIVEThis is a modified FDA-approved test that has been validated and its performance characteristics determined by the reporting laboratory.  This laboratory is certified under the Clinical Laboratory Improvement Amendments CLIA as qualified to perform high complexity clinical laboratory testing.    * >=100,000 COLONIES/mL  PROVIDENCIA STUARTII    Treated with cephalexin, organism resistant to prescribed antimicrobial.   Call patient, if complains of urinary symptoms (dysuria, urgency, increased frequency) then will treat with new antibiotic.   New antibiotic prescription: Ciprofloxacin  500 mg daily x 7 days  ED Provider: [name] PA-C   Kaja Jackowski 11/12/2024, 9:50 AM Clinical Pharmacist Monday - Friday phone -  [phone] Saturday - Sunday phone - [phone]

## 2024-11-12 NOTE — Telephone Encounter (Signed)
 Post ED Visit - Positive Culture Follow-up: Successful Patient Follow-Up  Culture assessed and recommendations reviewed by:  [x]  Rankin Sams, Pharm.D. []  Venetia Gully, Pharm.D., BCPS AQ-ID []  Garrel Crews, Pharm.D., BCPS []  Almarie Lunger, Pharm.D., BCPS []  [place], [address].D., BCPS, AAHIVP []  Rosaline Bihari, Pharm.D., BCPS, AAHIVP []  Vernell Meier, PharmD, BCPS []  Latanya Hint, PharmD, BCPS []  Donald Medley, PharmD, BCPS []  Rocky Bold, PharmD  Positive urine culture  []  Patient discharged without antimicrobial prescription and treatment is now indicated []  Organism is resistant to prescribed ED discharge antimicrobial []  Patient with positive blood cultures  Plan: Call pt if pt having urinary s/s change abx to Ciprofloxacin  500 mg po Q day x 7 days. Per ED provider: [name], PA-C  Spoke with patient, patient states he is not having any UTI s/s. Pt states he feels a lot better. No new abx called in.   Contacted patient, date 11/13/23, time 1:15 pm   Frank Carlson 11/12/2024, 1:10 PM

## 2024-11-17 ENCOUNTER — Encounter: Payer: Self-pay | Admitting: Physician Assistant

## 2024-11-20 ENCOUNTER — Ambulatory Visit
Admission: RE | Admit: 2024-11-20 | Discharge: 2024-11-20 | Disposition: A | Source: Ambulatory Visit | Attending: Physician Assistant | Admitting: Physician Assistant

## 2024-11-30 NOTE — Progress Notes (Signed)
 I advised patient of MRI results, voiced understanding an  thanked me for calling. Will follow up as scheduled.

## 2024-12-22 ENCOUNTER — Encounter

## 2024-12-25 ENCOUNTER — Ambulatory Visit: Admitting: Physician Assistant

## 2025-04-19 ENCOUNTER — Ambulatory Visit

## 2025-04-22 ENCOUNTER — Ambulatory Visit
